# Patient Record
Sex: Female | Born: 1950 | Race: White | Hispanic: No | Smoking: Former smoker
Health system: Southern US, Community
[De-identification: ages and names within clinical notes are randomized; demographics above are authoritative.]

## PROBLEM LIST (undated history)

## (undated) DIAGNOSIS — J45909 Unspecified asthma, uncomplicated: Secondary | ICD-10-CM

## (undated) DIAGNOSIS — G4730 Sleep apnea, unspecified: Secondary | ICD-10-CM

## (undated) DIAGNOSIS — I714 Abdominal aortic aneurysm, without rupture, unspecified: Secondary | ICD-10-CM

## (undated) DIAGNOSIS — I10 Essential (primary) hypertension: Secondary | ICD-10-CM

## (undated) DIAGNOSIS — M1990 Unspecified osteoarthritis, unspecified site: Secondary | ICD-10-CM

## (undated) DIAGNOSIS — J449 Chronic obstructive pulmonary disease, unspecified: Secondary | ICD-10-CM

## (undated) DIAGNOSIS — C189 Malignant neoplasm of colon, unspecified: Secondary | ICD-10-CM

## (undated) DIAGNOSIS — K219 Gastro-esophageal reflux disease without esophagitis: Secondary | ICD-10-CM

## (undated) HISTORY — PX: TUMOR EXCISION: SHX421

## (undated) HISTORY — PX: TONSILLECTOMY: SUR1361

## (undated) HISTORY — PX: KNEE SURGERY: SHX244

## (undated) HISTORY — PX: APPENDECTOMY: SHX54

## (undated) HISTORY — PX: TUBAL LIGATION: SHX77

## (undated) HISTORY — PX: ABDOMINAL HYSTERECTOMY: SHX81

## (undated) NOTE — *Deleted (*Deleted)
[facility]  [address] [place], [place] [address] Phone: [phone]  Fax: [phone]   Clinic Day:  11/11/2019  Referring physician: Hyman Hopes, MD  Chief Complaint: Alicia Holder is a 56 y.o. female with h/o carcinoid tumor, secondary polycythemia, iron deficiency s/p GI bleed and B12 deficiency who is seen for 4 month assessment.  HPI:  The patient was last seen in the hematology clinic on 07/08/2019. At that time, she felt good.  She stopped smoking 7 month prior.  She had stable shortness of breath.  She was on oxygen 2 L/min.  Exam was stable. Hematocrit was 44.5, hemoglobin 14.0, platelets 208,000, WBC 7,800. Ferritin was 27 with an iron saturation of 15% and a TIBC of 421. CA125 was 4.3.  TSH was 1.182 and free T4 was 0.97.  Low dose chest CT on 07/18/2019 revealed Lung-RADS 2S, benign appearance or behavior. There was aortic atherosclerosis, in addition to 2 vessel coronary artery disease. There was mild diffuse bronchial wall thickening with mild centrilobular and paraseptal emphysema; imaging findings suggestive of underlying COPD. There were calcifications of the aortic valve and mitral annulus.  Labs on 09/04/2019 revealed a hematocrit of 42.4, hemoglobin 13.4, platelets 166,000, WBC 7,200. Ferritin was 18.  She received monthly vitamin B12 injections x 4 (07/08/2019 - 10/02/2019).  During the interim, ***   Past Medical History:  Diagnosis Date  . AAA (abdominal aortic aneurysm) (HCC)   . Arthritis    knees, elbows  . Asthma   . Colon cancer (HCC)    colon  . COPD (chronic obstructive pulmonary disease) (HCC)   . GERD (gastroesophageal reflux disease)   . Hypertension   . Sleep apnea     Past Surgical History:  Procedure Laterality Date  . ABDOMINAL HYSTERECTOMY    . APPENDECTOMY    . CATARACT EXTRACTION W/PHACO Left 05/28/2019   Procedure: CATARACT EXTRACTION PHACO AND INTRAOCULAR LENS PLACEMENT (IOC) LEFT  VISION BLUE 31.19 02:18.2;  Surgeon: Galen Manila, MD;  Location: [facility];  Service: Ophthalmology;  Laterality: Left;  . CATARACT EXTRACTION W/PHACO Right 06/18/2019   Procedure: CATARACT EXTRACTION PHACO AND INTRAOCULAR LENS PLACEMENT (IOC) RIGHT 34.59  02:31.7;  Surgeon: Galen Manila, MD;  Location: [facility];  Service: Ophthalmology;  Laterality: Right;  sleep apnea  . ESOPHAGOGASTRODUODENOSCOPY N/A 01/12/2019   Procedure: ESOPHAGOGASTRODUODENOSCOPY (EGD);  Surgeon: Pasty Spillers, MD;  Location: [facility] END[facility];  Service: Endoscopy;  Laterality: N/A;  . ESOPHAGOGASTRODUODENOSCOPY (EGD) WITH PROPOFOL N/A 01/14/2019   Procedure: ESOPHAGOGASTRODUODENOSCOPY (EGD) WITH PROPOFOL;  Surgeon: Kerin Salen, MD;  Location: [facility];  Service: Gastroenterology;  Laterality: N/A;  . HEMOSTASIS CLIP PLACEMENT  01/14/2019   Procedure: HEMOSTASIS CLIP PLACEMENT;  Surgeon: Kerin Salen, MD;  Location: [facility] ENDOSCOPY;  Service: Gastroenterology;;  . KNEE SURGERY Right    fracture repair  . TONSILLECTOMY    . TUBAL LIGATION    . TUMOR EXCISION     colon    Family History  Problem Relation Age of Onset  . CAD Mother   . CAD Father     Social History:  reports that she quit smoking about 10 months ago. She has a 41.00 pack-year smoking history. She has never used smokeless tobacco. She reports current alcohol use. She reports that she does not use drugs. She is a great-grandmother. The patient is alone ***today.   Allergies:  Allergies  Allergen Reactions  . Aspirin     Hx of esophageal bleed  Current Medications: Current Outpatient Medications  Medication Sig Dispense Refill  . acetaminophen (TYLENOL) 500 MG tablet Take 1,000 mg by mouth every 6 (six) hours as needed for mild pain.    Marland Kitchen albuterol (VENTOLIN HFA) 108 (90 Base) MCG/ACT inhaler Inhale 1-2 puffs into the lungs every 4 (four) hours as needed for wheezing or shortness of breath.     Marland Kitchen alendronate  (FOSAMAX) 70 MG tablet Take 70 mg by mouth once a week.    Marland Kitchen buPROPion (WELLBUTRIN XL) 150 MG 24 hr tablet Take 150 mg by mouth daily.     Marland Kitchen docusate sodium (COLACE) 100 MG capsule Take 100 mg by mouth daily.    Marland Kitchen ipratropium-albuterol (DUONEB) 0.5-2.5 (3) MG/3ML SOLN Take 3 mLs by nebulization every 4 (four) hours as needed. 360 mL 0  . lisinopril (ZESTRIL) 20 MG tablet Take 20 mg by mouth daily.     . montelukast (SINGULAIR) 10 MG tablet Take 10 mg by mouth at bedtime.    Marland Kitchen omeprazole (PRILOSEC) 20 MG capsule Take 20 mg by mouth 2 (two) times daily.    Marland Kitchen oxyCODONE (OXY IR/ROXICODONE) 5 MG immediate release tablet Take 5 mg by mouth 3 (three) times daily as needed for moderate pain or breakthrough pain.     . pantoprazole (PROTONIX) 40 MG tablet TAKE 1 TABLET BY MOUTH EVERY DAY 90 tablet 0  . sertraline (ZOLOFT) 100 MG tablet Take 100 mg by mouth daily.    . simvastatin (ZOCOR) 20 MG tablet Take 20 mg by mouth daily.    . TRELEGY ELLIPTA 100-62.5-25 MCG/INH AEPB Inhale 1 puff into the lungs daily.      No current facility-administered medications for this visit.    Review of Systems  Constitutional: Negative for chills, diaphoresis, fever, malaise/fatigue and weight loss (up 2 pounds; goal weight 145 lbs).       Feels "better".   HENT: Negative.  Negative for congestion, ear pain, hearing loss, nosebleeds, sinus pain and sore throat.   Eyes: Negative.  Negative for blurred vision and double vision.  Respiratory: Positive for shortness of breath (on oxygen, 2L). Negative for cough and hemoptysis.        On 3 liters/min of oxygen.  Cardiovascular: Negative.  Negative for chest pain, palpitations, orthopnea and PND.  Gastrointestinal: Negative.  Negative for abdominal pain, blood in stool, constipation, diarrhea, melena, nausea and vomiting.       Eating well. Soft diet. No spicy or greasy fried food.  Genitourinary: Negative.  Negative for dysuria, frequency, hematuria and urgency.   Musculoskeletal: Positive for joint pain (chronic right knee pain, unchanged). Negative for back pain, myalgias and neck pain.  Skin: Negative.  Negative for rash.  Neurological: Negative.  Negative for dizziness, tingling, sensory change, focal weakness, weakness and headaches.  Endo/Heme/Allergies: Negative.  Does not bruise/bleed easily.  Psychiatric/Behavioral: Negative.  Negative for depression and memory loss. The patient is not nervous/anxious and does not have insomnia.   All other systems reviewed and are negative.  Performance status (ECOG): 1 - Symptomatic but completely ambulatory ***  Vitals There were no vitals taken for this visit.   Physical Exam Vitals and nursing note reviewed.  Constitutional:      General: She is not in acute distress.    Appearance: She is well-developed. She is not diaphoretic.     Comments: She has a portable oxygen tank by her side.  HENT:     Head: Normocephalic and atraumatic.     Mouth/Throat:  Pharynx: No oropharyngeal exudate.  Eyes:     General: No scleral icterus.    Conjunctiva/sclera: Conjunctivae normal.     Pupils: Pupils are equal, round, and reactive to light.     Comments: Blue eyes.  Neck:     Vascular: No JVD.  Cardiovascular:     Rate and Rhythm: Normal rate and regular rhythm.     Heart sounds: Normal heart sounds. No murmur heard.   Pulmonary:     Effort: Pulmonary effort is normal. No respiratory distress.     Breath sounds: Normal breath sounds. No wheezing or rales.  Abdominal:     General: Bowel sounds are normal. There is no distension.     Palpations: Abdomen is soft. There is no mass.     Tenderness: There is no abdominal tenderness. There is no guarding or rebound.  Musculoskeletal:        General: Normal range of motion.     Cervical back: Normal range of motion and neck supple.  Lymphadenopathy:     Head:     Right side of head: No preauricular, posterior auricular or occipital adenopathy.     Left  side of head: No preauricular, posterior auricular or occipital adenopathy.     Cervical: No cervical adenopathy.     Upper Body:     Right upper body: No supraclavicular adenopathy.     Left upper body: No supraclavicular adenopathy.  Skin:    General: Skin is warm and dry.     Coloration: Skin is not pale.     Comments: Nicotine stained nails.  Neurological:     Mental Status: She is alert and oriented to person, place, and time.  Psychiatric:        Behavior: Behavior normal.        Thought Content: Thought content normal.        Judgment: Judgment normal.     No visits with results within 3 Day(s) from this visit.  Latest known visit with results is:  Appointment on 09/04/2019  Component Date Value Ref Range Status  . Ferritin 09/04/2019 18  11 - 307 ng/mL Final   Performed at [facility], [address] [place] [place], [place] [address]  . WBC 09/04/2019 7.2  4.0 - 10.5 K/uL Final  . RBC 09/04/2019 4.50  3.87 - 5.11 MIL/uL Final  . Hemoglobin 09/04/2019 13.4  12.0 - 15.0 g/dL Final  . HCT 19/14/7829 42.4  36 - 46 % Final  . MCV 09/04/2019 94.2  80.0 - 100.0 fL Final  . MCH 09/04/2019 29.8  26.0 - 34.0 pg Final  . MCHC 09/04/2019 31.6  30.0 - 36.0 g/dL Final  . RDW 56/21/3086 14.7  11.5 - 15.5 % Final  . Platelets 09/04/2019 166  150 - 400 K/uL Final  . nRBC 09/04/2019 0.0  0.0 - 0.2 % Final  . Neutrophils Relative % 09/04/2019 63  % Final  . Neutro Abs 09/04/2019 4.6  1.7 - 7.7 K/uL Final  . Lymphocytes Relative 09/04/2019 24  % Final  . Lymphs Abs 09/04/2019 1.7  0.7 - 4.0 K/uL Final  . Monocytes Relative 09/04/2019 8  % Final  . Monocytes Absolute 09/04/2019 0.6  0.1 - 1.0 K/uL Final  . Eosinophils Relative 09/04/2019 4  % Final  . Eosinophils Absolute 09/04/2019 0.3  0 - 0 K/uL Final  . Basophils Relative 09/04/2019 1  % Final  . Basophils Absolute 09/04/2019 0.1  0 - 0 K/uL Final  .  Immature Granulocytes 09/04/2019 0  % Final  . Abs Immature Granulocytes  09/04/2019 0.03  0.00 - 0.07 K/uL Final   Performed at [facility], [address], [place], [place] [address]    Assessment:  KIMIYA BRUNELLE is a 10 y.o. 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 She has a 60+ pack year smoking history. She denies sleep apnea.   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 Last colonoscopywas 10/2012 [phone] per patient). She has lost 120 pounds in 3 years with portion control and food choices.  Work-upon 01/04/2017 revealed a hematocrit of 50.8, hemoglobin 17, MCV 94.7, platelets 173,000, white count 7400 with an ANC of 5100. Differential was normal. Normal labs included CMP, epo level (10.7), ferritin (27), with an iron saturation (6%), JAK2 V617F and exons 12-15 were negative. BCR-ABL testing was negative for e1a2 (p190), e13a2 (b2a2, p210) and e14a2 (b3a2, p210) fusion transcripts. Carbon monoxide levelwas 17% (<9% smokers).   Low dose chest CT on 03/16/2017 was lung-RADS 2, benign appearance or behavior. There was left main and two-vessel coronary atherosclerosis. There was a 4.2 cm ascending thoracic aortic aneurysmwith recommendation for annual imaging followup by CTA or MRA. 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 There was a multinodular goiter. Suspected dominant 4.5 cm inferior left thyroid lobe nodule. There was a small to moderate hiatal hernia. Low dose chest CTon 03/20/2018 revealed benign appearance or behavior. Lung-RADS score was 2.  Thyroid ultrasound on 04/03/2017 revealed 7 nodules. Left mid thyroid nodule (labeled 4) met criteria for biopsy. The left inferior thyroid nodule (labeled 7) would meet criteria for biopsy, however, was favored to be a pseudo nodule and extension of thyroid goiter into the mediastinum given the appearance on prior CT. Left thyroid nodules (labeled 5 and 6) both met criteria for surveillance. None of the right-sided nodules met criteria for  surveillance or biopsy.Thyroid ultrasoundon 09/10/2018 revealed thyromegaly with bilateral nodules, stable. None metcriteria for biopsy. Recommended annual/biennial ultrasound follow-up   FNA thyroid biopsy x 2 on 04/20/2017 of the left mid thyroid nodule and left inferior thyroid nodule were benign (Bethesda category II) and c/w benign follicular nodule.   She began a phlebotomy programon 02/08/2017 (last 10/12/2018). She undergoes small volume phlebotomy (300 cc)if her hematocrit is >47.  She was admitted to [facility] from 01/11/2019 - 01/13/2019 then transferred to [facility] from 01/13/2019 - 01/18/2019 for GI bleeding.  Hemoglobin was 5.9.  EGD on 01/12/2019 revealed red blood in the distal esophagus and large blood clots in the gastric fundus. Hemospray was used to treat the distal esophagus and gastric fundus.  She received 6 units of PRBCs.  She received a Protonix drip. She was transferred to [facility] for vascular intervention.  EGD on 01/14/2019 revealed severe (LA grade D) esophagitis with bleeding.  There were esophageal ulcers with active bleeding.  Hemostatic clip was placed.There was erythematous mucosa in the cardia, gastric fundus, gastric body, incisura, antrum, and prepyloric region of the stomach and pylorus.  She received 1 unit of PRBCs on 01/17/2019.  She has iron deficiency anemia s/p massive GI bleeding.  Labs on 01/11/2019 revealed a ferritin 26 with an iron saturation 12% with a TIBC 247.  B12 was 173 (low) and folate 10.3 (normal).  Hemolysis evaluation (Coombs, haptoglobin, LDH) was normal.  Retic was 4.8%.  She received weekly Venofer x 3 (02/18/2019 - 03/04/2019).   She has B12 deficiency.  B12 was 173 on 01/11/2019.  She receives monthly B12 injections (  began 01/14/2019; last 10/02/2019).  She has a history of carcinoid tumor.  Chromogranin was 926.7 (0-101.8) on 02/13/2019 and 330.8 on 05/09/2019.   24 hour urine for 5HIAA was 2.4 mg/24 hours (0-14.9) on  03/17/2019.  She received her first COVID-19 vaccine at the end of 06/2019; she is due for her second shot next week.   Symptomatically, ***  Plan: 1.   Review labs from 11/11/2019.   2.   Secondary erythrocytosis  She stopped smoking 7 months ago.  Hematocrit 37.2  Hemoglobin 11.0.  MCV 90.1 on 03/14/2019.  Hematocrit 44.5  Hemoglobin 14.0.  MCV 90.6 on 07/05/2019.  Continue to monitor. 3.  History of GI bleed             EGD on 01/14/2019 revealed severe esophagitis with ulcers and active bleeding.             She remains on Protonix.  Anticipate repeat upper endoscopy.  Continue to monitor. 4.   Iron deficiency             Hematocrit 44.5  Hemoglobin 14.0.  MCV 90.6 on 07/05/2019.             Ferritin 27 on 07/05/2019.             She completed Venofer x 3 on 03/04/2019.  No Venofer needed.  Continue to monitor. 5.   B12 deficiency             B12 was 173 on 01/11/2019.             She received B12 on 06/06/2019.             B12 and monthly x 6.  Monitor folate annually. 6.  History of carcinoid tumor             Chromogranin was 926.7 (high) on 02/13/2019.  Unclear if elevation due to recurrent carcinoid, chronic atrophic gastritis or PPI.  24 hour urine for 5HIAA was 2.4 mg/24 hours (0-14.9).  No current plan for octreotide scan vs DOTATE PET scan.  Continue to monitor. 7.   Thyroid nodules and low TSH TSH was 1.182 and free T4 was 0.97 on 07/05/2019.  Thyroid ultrasoundon 09/10/2018 revealed stable bilateral thyroid nodules. Thyroid ultrasound planned in 08/2019. 8.     Left ovarian cyst  Patient denies any abdominal symptoms.  CA125 was 4.3 on 07/05/2019.  Transabdominal and transvaginal ultrasound of pelvis on 04/22/2019 revealed a peripheral 1.7 x 1.0 x 1.0 cm cystic lesion in the left ovary with a single internal septation.   Discuss plan for follow-up ultrasound on 08/16/2019. 9.   Pulmonary nodules  Chest CT on 04/19/2019 was  personally reviewed.  Agree with radiology interpretation.    There is a new 8 mm left upper lobe nodule and is well as a 7 mm right upper lobe nodule   Discuss importance of short-term follow-up given smoking history   10.Weight loss Weight is up 2 pounds. Goal weight is 145 pounds. Continue to monitor. 11.   RTC in 2 months for labs (CBC with diff, ferritin) and B12. 12.   RTC in 4 months for MD assessment, labs (CBC with diff, ferritin, iron studies, chromogranin- day before), B12 and +/- Venofer.  I discussed the assessment and treatment plan with the patient.  The patient was provided an opportunity to ask questions and all were answered.  The patient agreed with the plan and demonstrated an understanding of the instructions.  The patient  was advised to call back if the symptoms worsen or if the condition fails to improve as anticipated.  I provided *** minutes of face-to-face time during this this encounter and > 50% was spent counseling as documented under my assessment and plan.  Rosey Bath, MD, PhD    11/11/2019, 11:45 AM  I, Danella Penton Tufford, am acting as a Neurosurgeon for [employer]. Merlene Pulling, MD.   I, Melissa C. Merlene Pulling, MD, have reviewed the above documentation for accuracy and completeness, and I agree with the above.

---

## 2002-12-06 ENCOUNTER — Other Ambulatory Visit: Payer: Self-pay

## 2003-10-10 ENCOUNTER — Other Ambulatory Visit: Payer: Self-pay

## 2005-04-28 ENCOUNTER — Ambulatory Visit: Payer: Self-pay

## 2006-04-21 ENCOUNTER — Inpatient Hospital Stay: Payer: Self-pay | Admitting: Internal Medicine

## 2006-04-21 ENCOUNTER — Ambulatory Visit: Payer: Self-pay | Admitting: Cardiology

## 2006-04-21 ENCOUNTER — Other Ambulatory Visit: Payer: Self-pay

## 2006-05-21 ENCOUNTER — Ambulatory Visit: Payer: Self-pay | Admitting: Specialist

## 2006-09-25 ENCOUNTER — Ambulatory Visit: Payer: Self-pay

## 2007-05-22 ENCOUNTER — Ambulatory Visit: Payer: Self-pay | Admitting: Gastroenterology

## 2007-07-09 DIAGNOSIS — Z85060 Personal history of malignant carcinoid tumor of small intestine: Secondary | ICD-10-CM | POA: Insufficient documentation

## 2007-08-31 ENCOUNTER — Ambulatory Visit: Payer: Self-pay | Admitting: Surgery

## 2007-08-31 ENCOUNTER — Other Ambulatory Visit: Payer: Self-pay

## 2007-09-12 ENCOUNTER — Inpatient Hospital Stay: Payer: Self-pay | Admitting: Surgery

## 2009-12-28 ENCOUNTER — Ambulatory Visit: Payer: Self-pay | Admitting: Internal Medicine

## 2010-04-08 ENCOUNTER — Ambulatory Visit: Payer: Self-pay | Admitting: Internal Medicine

## 2011-08-17 ENCOUNTER — Ambulatory Visit: Payer: Self-pay | Admitting: Internal Medicine

## 2012-11-08 ENCOUNTER — Ambulatory Visit: Payer: Self-pay | Admitting: Gastroenterology

## 2012-11-21 ENCOUNTER — Ambulatory Visit: Payer: Self-pay | Admitting: Internal Medicine

## 2013-04-30 ENCOUNTER — Emergency Department: Payer: Self-pay | Admitting: Emergency Medicine

## 2014-07-10 ENCOUNTER — Other Ambulatory Visit: Payer: Self-pay | Admitting: Internal Medicine

## 2014-07-10 DIAGNOSIS — Z780 Asymptomatic menopausal state: Secondary | ICD-10-CM

## 2014-07-10 DIAGNOSIS — Z1231 Encounter for screening mammogram for malignant neoplasm of breast: Secondary | ICD-10-CM

## 2014-07-16 ENCOUNTER — Ambulatory Visit
Admission: RE | Admit: 2014-07-16 | Discharge: 2014-07-16 | Disposition: A | Discharge status: Home / Self Care | Payer: Medicare HMO | Source: Ambulatory Visit | Attending: Internal Medicine | Admitting: Internal Medicine

## 2014-07-16 DIAGNOSIS — Z1231 Encounter for screening mammogram for malignant neoplasm of breast: Secondary | ICD-10-CM | POA: Diagnosis present

## 2014-07-16 DIAGNOSIS — M8580 Other specified disorders of bone density and structure, unspecified site: Secondary | ICD-10-CM | POA: Insufficient documentation

## 2014-07-16 DIAGNOSIS — Z780 Asymptomatic menopausal state: Secondary | ICD-10-CM

## 2014-07-16 HISTORY — DX: Malignant neoplasm of colon, unspecified: C18.9

## 2016-05-23 ENCOUNTER — Telehealth: Payer: Self-pay | Admitting: *Deleted

## 2016-05-23 NOTE — Telephone Encounter (Signed)
Received referral for low dose lung cancer screening CT scan. Voicemail left at phone number listed in EMR for patient to call me back to facilitate scheduling scan.  

## 2016-05-26 ENCOUNTER — Telehealth: Payer: Self-pay | Admitting: *Deleted

## 2016-05-26 NOTE — Telephone Encounter (Signed)
Received referral for low dose lung cancer screening CT scan. Voicemail left at phone number listed in EMR for patient to call me back to facilitate scheduling scan.  

## 2016-06-13 ENCOUNTER — Ambulatory Visit: Payer: Medicare HMO | Admitting: Oncology

## 2016-06-15 ENCOUNTER — Inpatient Hospital Stay: Payer: Medicare HMO | Admitting: Hematology and Oncology

## 2016-06-15 NOTE — Progress Notes (Deleted)
[facility] [place] [facility] day:  06/15/2016  Chief Complaint: Alicia Holder is a 66 y.o. female  With polycythemia who is referred in consultation by Dr. Kingsley Spittle for assessment and management.  HPI: ***   Past Medical History:  Diagnosis Date  . Colon cancer     No past surgical history on file.  No family history on file.  Social History:  has no tobacco, alcohol, and drug history on file.  The patient is accompanied by *** alone today.  Allergies: Allergies not on file  Current Medications: No current outpatient prescriptions on file.   No current facility-administered medications for this visit.     Review of Systems:  GENERAL:  Feels good.  Active.  No fevers, sweats or weight loss. PERFORMANCE STATUS (ECOG):  *** HEENT:  No visual changes, runny nose, sore throat, mouth sores or tenderness. Lungs: No shortness of breath or cough.  No hemoptysis. Cardiac:  No chest pain, palpitations, orthopnea, or PND. GI:  No nausea, vomiting, diarrhea, constipation, melena or hematochezia. GU:  No urgency, frequency, dysuria, or hematuria. Musculoskeletal:  No back pain.  No joint pain.  No muscle tenderness. Extremities:  No pain or swelling. Skin:  No rashes or skin changes. Neuro:  No headache, numbness or weakness, balance or coordination issues. Endocrine:  No diabetes, thyroid issues, hot flashes or night sweats. Psych:  No mood changes, depression or anxiety. Pain:  No focal pain. Review of systems:  All other systems reviewed and found to be negative.   Physical Exam: There were no vitals taken for this visit. GENERAL:  Well developed, well nourished, sitting comfortably in the exam room in no acute distress. MENTAL STATUS:  Alert and oriented to person, place and time. HEAD:  *** hair.  Normocephalic, atraumatic, face symmetric, no Cushingoid features. EYES:  *** eyes.  Pupils equal round and reactive to light and accomodation.   No conjunctivitis or scleral icterus. ENT:  Oropharynx clear without lesion.  Tongue normal. Mucous membranes moist.  RESPIRATORY:  Clear to auscultation without rales, wheezes or rhonchi. CARDIOVASCULAR:  Regular rate and rhythm without murmur, rub or gallop. ABDOMEN:  Soft, non-tender, with active bowel sounds, and no hepatosplenomegaly.  No masses. SKIN:  No rashes, ulcers or lesions. EXTREMITIES: No edema, no skin discoloration or tenderness.  No palpable cords. LYMPH NODES: No palpable cervical, supraclavicular, axillary or inguinal adenopathy  NEUROLOGICAL: Unremarkable. PSYCH:  Appropriate.  No visits with results within 3 Day(s) from this visit.  Latest known visit with results is:  No results found for any previous visit.    Assessment:  Alicia Holder is a 66 y.o. female ***  Plan: 1. *** 2. *** 3. *** 4. *** 5. ***  Lequita Asal, MD  06/15/2016, 4:45 AM

## 2016-06-22 ENCOUNTER — Ambulatory Visit: Payer: Medicare HMO | Admitting: Hematology and Oncology

## 2016-06-22 NOTE — Progress Notes (Deleted)
[place] [facility] day:  06/22/2016  Chief Complaint: Alicia Holder is a 66 y.o. female with polycythemia who is referred in consultation by Dr Quay Burow for assessment and management.  HPI: ***   Past Medical History:  Diagnosis Date  . Colon cancer     No past surgical history on file.  No family history on file.  Social History:  has no tobacco, alcohol, and drug history on file.  The patient is accompanied by *** alone today.  Allergies: Allergies not on file  Current Medications: No current outpatient prescriptions on file.   No current facility-administered medications for this visit.     Review of Systems:  GENERAL:  Feels good.  Active.  No fevers, sweats or weight loss. PERFORMANCE STATUS (ECOG):  *** HEENT:  No visual changes, runny nose, sore throat, mouth sores or tenderness. Lungs: No shortness of breath or cough.  No hemoptysis. Cardiac:  No chest pain, palpitations, orthopnea, or PND. GI:  No nausea, vomiting, diarrhea, constipation, melena or hematochezia. GU:  No urgency, frequency, dysuria, or hematuria. Musculoskeletal:  No back pain.  No joint pain.  No muscle tenderness. Extremities:  No pain or swelling. Skin:  No rashes or skin changes. Neuro:  No headache, numbness or weakness, balance or coordination issues. Endocrine:  No diabetes, thyroid issues, hot flashes or night sweats. Psych:  No mood changes, depression or anxiety. Pain:  No focal pain. Review of systems:  All other systems reviewed and found to be negative.   Physical Exam: There were no vitals taken for this visit. GENERAL:  Well developed, well nourished, sitting comfortably in the exam room in no acute distress. MENTAL STATUS:  Alert and oriented to person, place and time. HEAD:  *** hair.  Normocephalic, atraumatic, face symmetric, no Cushingoid features. EYES:  *** eyes.  Pupils equal round and reactive to light and accomodation.  No  conjunctivitis or scleral icterus. ENT:  Oropharynx clear without lesion.  Tongue normal. Mucous membranes moist.  RESPIRATORY:  Clear to auscultation without rales, wheezes or rhonchi. CARDIOVASCULAR:  Regular rate and rhythm without murmur, rub or gallop. ABDOMEN:  Soft, non-tender, with active bowel sounds, and no hepatosplenomegaly.  No masses. SKIN:  No rashes, ulcers or lesions. EXTREMITIES: No edema, no skin discoloration or tenderness.  No palpable cords. LYMPH NODES: No palpable cervical, supraclavicular, axillary or inguinal adenopathy  NEUROLOGICAL: Unremarkable. PSYCH:  Appropriate.   No visits with results within 3 Day(s) from this visit.  Latest known visit with results is:  No results found for any previous visit.    Assessment:  Alicia Holder is a 66 y.o. female ***  Plan: 1. *** 2. *** 3. *** 4. *** 5. ***  Lequita Asal, MD  06/22/2016, 6:04 AM

## 2016-12-16 ENCOUNTER — Other Ambulatory Visit: Payer: Self-pay | Admitting: Internal Medicine

## 2016-12-16 DIAGNOSIS — Z1231 Encounter for screening mammogram for malignant neoplasm of breast: Secondary | ICD-10-CM

## 2017-01-04 ENCOUNTER — Encounter: Payer: Self-pay | Admitting: Hematology and Oncology

## 2017-01-04 ENCOUNTER — Other Ambulatory Visit: Payer: Self-pay

## 2017-01-04 ENCOUNTER — Inpatient Hospital Stay: Payer: Medicare HMO | Attending: Hematology and Oncology | Admitting: Hematology and Oncology

## 2017-01-04 ENCOUNTER — Inpatient Hospital Stay: Payer: Medicare HMO

## 2017-01-04 VITALS — BP 161/91 | HR 85 | Temp 96.5°F | Resp 20 | Ht 63.5 in | Wt 182.8 lb

## 2017-01-04 DIAGNOSIS — Z806 Family history of leukemia: Secondary | ICD-10-CM

## 2017-01-04 DIAGNOSIS — Z85038 Personal history of other malignant neoplasm of large intestine: Secondary | ICD-10-CM | POA: Diagnosis not present

## 2017-01-04 DIAGNOSIS — D751 Secondary polycythemia: Secondary | ICD-10-CM | POA: Diagnosis not present

## 2017-01-04 DIAGNOSIS — F17210 Nicotine dependence, cigarettes, uncomplicated: Secondary | ICD-10-CM

## 2017-01-04 DIAGNOSIS — Z79899 Other long term (current) drug therapy: Secondary | ICD-10-CM | POA: Diagnosis not present

## 2017-01-04 DIAGNOSIS — M25562 Pain in left knee: Secondary | ICD-10-CM | POA: Diagnosis not present

## 2017-01-04 DIAGNOSIS — R0602 Shortness of breath: Secondary | ICD-10-CM | POA: Diagnosis not present

## 2017-01-04 DIAGNOSIS — J449 Chronic obstructive pulmonary disease, unspecified: Secondary | ICD-10-CM | POA: Diagnosis not present

## 2017-01-04 DIAGNOSIS — G8929 Other chronic pain: Secondary | ICD-10-CM | POA: Diagnosis not present

## 2017-01-04 DIAGNOSIS — R634 Abnormal weight loss: Secondary | ICD-10-CM

## 2017-01-04 LAB — CBC WITH DIFFERENTIAL/PLATELET
BASOS ABS: 0.1 10*3/uL (ref 0–0.1)
Basophils Relative: 1 %
Eosinophils Absolute: 0.1 10*3/uL (ref 0–0.7)
Eosinophils Relative: 2 %
HEMATOCRIT: 50.8 % — AB (ref 35.0–47.0)
HEMOGLOBIN: 17 g/dL — AB (ref 12.0–16.0)
LYMPHS PCT: 21 %
Lymphs Abs: 1.5 10*3/uL (ref 1.0–3.6)
MCH: 31.7 pg (ref 26.0–34.0)
MCHC: 33.4 g/dL (ref 32.0–36.0)
MCV: 94.7 fL (ref 80.0–100.0)
MONO ABS: 0.6 10*3/uL (ref 0.2–0.9)
MONOS PCT: 8 %
NEUTROS ABS: 5.1 10*3/uL (ref 1.4–6.5)
NEUTROS PCT: 68 %
Platelets: 173 10*3/uL (ref 150–440)
RBC: 5.37 MIL/uL — ABNORMAL HIGH (ref 3.80–5.20)
RDW: 14.8 % — AB (ref 11.5–14.5)
WBC: 7.4 10*3/uL (ref 3.6–11.0)

## 2017-01-04 LAB — COMPREHENSIVE METABOLIC PANEL
ALBUMIN: 3.9 g/dL (ref 3.5–5.0)
ALT: 14 U/L (ref 14–54)
ANION GAP: 9 (ref 5–15)
AST: 17 U/L (ref 15–41)
Alkaline Phosphatase: 38 U/L (ref 38–126)
BUN: 9 mg/dL (ref 6–20)
CO2: 27 mmol/L (ref 22–32)
Calcium: 8.7 mg/dL — ABNORMAL LOW (ref 8.9–10.3)
Chloride: 100 mmol/L — ABNORMAL LOW (ref 101–111)
Creatinine, Ser: 0.82 mg/dL (ref 0.44–1.00)
GFR calc non Af Amer: >60 mL/min (ref >60)
GLUCOSE: 98 mg/dL (ref 65–99)
POTASSIUM: 4.7 mmol/L (ref 3.5–5.1)
SODIUM: 136 mmol/L (ref 135–145)
TOTAL PROTEIN: 7.1 g/dL (ref 6.5–8.1)
Total Bilirubin: 0.6 mg/dL (ref 0.3–1.2)

## 2017-01-04 LAB — IRON AND TIBC
Iron: 23 ug/dL — ABNORMAL LOW (ref 28–170)
SATURATION RATIOS: 6 % — AB (ref 10.4–31.8)
TIBC: 392 ug/dL (ref 250–450)
UIBC: 369 ug/dL

## 2017-01-04 LAB — FERRITIN: Ferritin: 27 ng/mL (ref 11–307)

## 2017-01-04 NOTE — Progress Notes (Signed)
Patient here today as new evaluation regarding polycythemia.  Referred by [place].

## 2017-01-04 NOTE — Progress Notes (Signed)
[facility] day:  01/04/2017  Chief Complaint: Alicia Holder is a 66 y.o. 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 Kingsley Spittle for assessment and management.  HPI:  Patient was advised about polycythemia in 04/2015 when routine labs were performed.  Patient states, "My lab work was always good until this last time".  No labs included on referral paperwork.   The patient has a 60+ pack year smoking history.  She has COPD.  Patient was started on supplemental oxygen, which she used for "about a year".  Notes indicate that she stopped using oxygen in the summer of 2015.  She was followed by Dr. Raul Del in the past.   She was referred to the low dose chest CT program in the past, however she did not follow through with testing due to transportation issues.  Patient notes that Burgess Estelle, RN is her cousin.   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  Last colonoscopy was 10/2012.  She has been referred to Dr. Allen Norris for monitoring.  She has a family history of leukemia.   Symptomatically, she feels "great". She denies fevers and sweats. Patient notes that she has intentionally lost 120 pounds over the last 3 years (original weight "over 300 pounds").   She states that she restructured her eating and uses portion control.  She eats 1150 calories/day.  Weight has been stable over the last 3 months. Patient maintains a 1500 calorie diet. She eats lots of vegetables. Patient eats some type of meat daily. Patient denies any ice pica.  She has orthopnea with associated cough when supine. She denies chest pain and shortness of breath. Patient experiences opioid induced constipation. She takes opioids for chronic LEFT knee pain; she has hardware in place.   Mammogram scheduled for 01/19/2017.  Last colonoscopy was in 2014. She is scheduled for a repeat colonoscopy in 2019. Patient has been tested for OSAH  syndrome; results from PSG were negative.    Past Medical History:  Diagnosis Date  . Colon cancer [facility])     Past Surgical History:  Procedure Laterality Date  . ABDOMINAL HYSTERECTOMY    . APPENDECTOMY      No family history on file.  Social History:  reports that she has been smoking.  She has a 41.00 pack-year smoking history. She does not have any smokeless tobacco history on file. She reports that she drinks alcohol. Her drug history is not on file.  She states that she smokes 3 ppd x 20 years.  She now smokes a pack of cigarettes a day.  She drinks 1-2 alcoholic drinks/month.  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  Patient denies known exposures to radiation or toxins. She lives with her mentally challenged daughter, Alicia Holder.  Her other daughter, Alicia Holder, helps out.  Patient does not drive.  She lives in a trailer in [place] that is near her daughter, Alicia Holder.  She has several grandchildren.  She has a GED and geriatric nursing assistant degree.  The patient is alone today.  Allergies: No Known Allergies  Current Medications: Current Outpatient Medications  Medication Sig Dispense Refill  . ADVAIR DISKUS 250-50 MCG/DOSE AEPB Inhale 1 puff into the lungs 2 (two) times daily.    Marland Kitchen docusate sodium (COLACE) 100 MG capsule Take 100 mg by mouth daily.    Marland Kitchen lisinopril (PRINIVIL,ZESTRIL) 10 MG  tablet Take 10 mg by mouth daily.    . montelukast (SINGULAIR) 10 MG tablet Take 10 mg by mouth at bedtime.    Marland Kitchen oxyCODONE (OXY IR/ROXICODONE) 5 MG immediate release tablet Take 5 mg by mouth 3 (three) times daily.    . sertraline (ZOLOFT) 50 MG tablet Take 50 mg by mouth daily.    . simvastatin (ZOCOR) 20 MG tablet Take 20 mg by mouth daily.     No current facility-administered medications for this visit.     Review of Systems:  GENERAL:  Feels "great".  No fevers, sweats or unintentional weight loss (see HPI). PERFORMANCE STATUS (ECOG):  0 HEENT:  No visual  changes, runny nose, sore throat, mouth sores or tenderness. Lungs: COPD. Orthopnea with associated cough. No shortness of breath at rest while upright.  No hemoptysis.  No sleep apnea. Cardiac:  No chest pain, palpitations, orthopnea, or PND. GI:  Opiod induced constipation.  No nausea, vomiting, diarrhea, melena or hematochezia. GU:  No urgency, frequency, dysuria, or hematuria. Musculoskeletal:  No back pain.  Fingers sore.  No muscle tenderness. Extremities:  No pain or swelling. Skin:  No rashes or skin changes. Neuro:  No headache, numbness or weakness, balance or coordination issues. Endocrine:  No diabetes, thyroid issues, hot flashes or night sweats. Psych:  No mood changes.  Depression on Zoloft.  No anxiety. Pain:  No focal pain. Review of systems:  All other systems reviewed and found to be negative.  Physical Exam: Blood pressure (!) 161/91, pulse 85, temperature (!) 96.5 F (35.8 C), temperature source Tympanic, resp. rate 20, height 5' 3.5" (1.613 m), weight 182 lb 12.2 oz (82.9 kg), SpO2 93 %. GENERAL:  Well developed, well nourished, woman sitting comfortably in the exam room in no acute distress. MENTAL STATUS:  Alert and oriented to person, place and time. HEAD:  Short red hair.  Normocephalic, atraumatic, face symmetric, no Cushingoid features. EYES:  Pink rimmed glasses.  Blue eyes.  Pupils equal round and reactive to light and accomodation.  No conjunctivitis or scleral icterus. ENT:  Oropharynx clear without lesion.  Tongue normal.  No upper teeth.  Four lower teeth.  Mucous membranes moist.  RESPIRATORY:  Decreased respiratory excursion with decreased breath sounds at the bases.  Clear to auscultation without rales, wheezes or rhonchi. CARDIOVASCULAR:  Regular rate and rhythm without murmur, rub or gallop. ABDOMEN:  Umbilical hernia. Soft, non-tender, with active bowel sounds, and no hepatosplenomegaly.  No masses. SKIN:  Fingertips and nails are black from unfiltered  cigarette use. No rashes, ulcers or lesions. EXTREMITIES: No edema, no skin discoloration or tenderness.  No palpable cords. LYMPH NODES: No palpable cervical, supraclavicular, axillary or inguinal adenopathy  NEUROLOGICAL: Unremarkable. PSYCH:  Appropriate.   Appointment on 01/04/2017  Component Date Value Ref Range Status  . Iron 01/04/2017 23* 28 - 170 ug/dL Final  . TIBC 01/04/2017 392  250 - 450 ug/dL Final  . Saturation Ratios 01/04/2017 6* 10.4 - 31.8 % Final  . UIBC 01/04/2017 369  ug/dL Final  . Ferritin 01/04/2017 27  11 - 307 ng/mL Final  . Carbon Monoxide, Blood 01/04/2017 17.0* 0.0 - 3.6 % Final   Comment: (NOTE)                            Environmental Exposure:  Nonsmokers           <3.7                             Smokers              <9.9                            Occupational Exposure:                             BEI                   3.5                                Detection Limit =  0.2 **Verified by repeat analysis** Performed At: [facility] [place], [place] [id number] Rush Farmer MD GY:[id number]   . Erythropoietin 01/04/2017 10.7  2.6 - 18.5 mIU/mL Final   Comment: (NOTE) [employer] UniCel DxI 800 Immunoassay System Performed At: [facility] [place], [place] [id number] Rush Farmer MD EX:[id number]   . Sodium 01/04/2017 136  135 - 145 mmol/L Final  . Potassium 01/04/2017 4.7  3.5 - 5.1 mmol/L Final  . Chloride 01/04/2017 100* 101 - 111 mmol/L Final  . CO2 01/04/2017 27  22 - 32 mmol/L Final  . Glucose, Bld 01/04/2017 98  65 - 99 mg/dL Final  . BUN 01/04/2017 9  6 - 20 mg/dL Final  . Creatinine, Ser 01/04/2017 0.82  0.44 - 1.00 mg/dL Final  . Calcium 01/04/2017 8.7* 8.9 - 10.3 mg/dL Final  . Total Protein 01/04/2017 7.1  6.5 - 8.1 g/dL Final  . Albumin 01/04/2017 3.9  3.5 - 5.0 g/dL Final  . AST 01/04/2017 17  15 - 41 U/L Final  . ALT 01/04/2017 14  14 - 54 U/L  Final  . Alkaline Phosphatase 01/04/2017 38  38 - 126 U/L Final  . Total Bilirubin 01/04/2017 0.6  0.3 - 1.2 mg/dL Final  . GFR calc non Af Amer 01/04/2017 >60  >60 mL/min Final  . GFR calc Af Amer 01/04/2017 >60  >60 mL/min Final   Comment: (NOTE) The eGFR has been calculated using the CKD EPI equation. This calculation has not been validated in all clinical situations. eGFR's persistently <60 mL/min signify possible Chronic Kidney Disease.   . Anion gap 01/04/2017 9  5 - 15 Final  . WBC 01/04/2017 7.4  3.6 - 11.0 K/uL Final  . RBC 01/04/2017 5.37* 3.80 - 5.20 MIL/uL Final  . Hemoglobin 01/04/2017 17.0* 12.0 - 16.0 g/dL Final  . HCT 01/04/2017 50.8* 35.0 - 47.0 % Final  . MCV 01/04/2017 94.7  80.0 - 100.0 fL Final  . MCH 01/04/2017 31.7  26.0 - 34.0 pg Final  . MCHC 01/04/2017 33.4  32.0 - 36.0 g/dL Final  . RDW 01/04/2017 14.8* 11.5 - 14.5 % Final  . Platelets 01/04/2017 173  150 - 440 K/uL Final  . Neutrophils Relative % 01/04/2017 68  % Final  . Neutro Abs 01/04/2017 5.1  1.4 - 6.5 K/uL Final  . Lymphocytes Relative 01/04/2017 21  % Final  . Lymphs Abs 01/04/2017 1.5  1.0 - 3.6 K/uL Final  . Monocytes Relative 01/04/2017 8  %  Final  . Monocytes Absolute 01/04/2017 0.6  0.2 - 0.9 K/uL Final  . Eosinophils Relative 01/04/2017 2  % Final  . Eosinophils Absolute 01/04/2017 0.1  0 - 0.7 K/uL Final  . Basophils Relative 01/04/2017 1  % Final  . 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  She has a 60+ pack year smoking history.  She denies sleep apnea.   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  Last colonoscopy was 10/2012 (next due 2019 per patient).  She has lost 120 pounds in 3 years with portion control and food choices.  Symptomatically, she feels good.  Exam reveals tobacco stained fingernails.  Plan: 1.  Discuss erythrocytosis.  Discuss secondary and primary  causes.  Encourage smoking cessation.  Discuss laboratory work-up. 2.  Discuss low dose chest CT program. 3.  Labs today: CBC with diff, CMP, JAK2 with reflex to exon 12-15, BCR-ABL, epo level, carbon monoxide level, ferritin, iron studies. 4.  RTC in 1 week for MD assessment, review of testing, and discussion regarding direction of therapy.   Lequita Asal, MD  01/04/2017, 2:28 PM   I saw and evaluated the patient, participating in the key portions of the service and reviewing pertinent diagnostic studies and records.  I reviewed the nurse practitioner's note and agree with the findings and the plan.  The assessment and plan were discussed with the patient.  Several questions were asked by the patient and answered.   Nolon Stalls, MD 01/04/2017,2:28 PM

## 2017-01-05 ENCOUNTER — Telehealth: Payer: Self-pay | Admitting: *Deleted

## 2017-01-05 LAB — CARBON MONOXIDE, BLOOD (PERFORMED AT REF LAB): Carbon Monoxide, Blood: 17 % — ABNORMAL HIGH (ref 0.0–3.6)

## 2017-01-05 LAB — ERYTHROPOIETIN: ERYTHROPOIETIN: 10.7 m[IU]/mL (ref 2.6–18.5)

## 2017-01-05 NOTE — Telephone Encounter (Signed)
Received referral for initial lung cancer screening scan. Contacted patient who requests a call back in 2 weeks to plan for scan in January.

## 2017-01-09 ENCOUNTER — Encounter: Payer: Self-pay | Admitting: Hematology and Oncology

## 2017-01-09 ENCOUNTER — Ambulatory Visit: Payer: Medicare HMO

## 2017-01-11 ENCOUNTER — Inpatient Hospital Stay: Payer: Medicare HMO | Admitting: Hematology and Oncology

## 2017-01-11 LAB — BCR-ABL1, CML/ALL, PCR, QUANT

## 2017-01-11 NOTE — Progress Notes (Deleted)
[facility] day:  01/11/2017   Chief Complaint: Alicia Holder is a 66 y.o. 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  HPI:   The patient was last seen in the hematology clinic on 01/04/2017 for initial consultation regarding polycythemia.  Prior labs were not available.  She had a 60+ pack year smoking history.  She denied sleep apnea.  Symptomatically, she felt good.  Exam revealed nicotine stained fingers and nails.  She underwent a work-up.  CBC revealed a hematocrit of 50.8, hemoglobin 17, MCV 94.7, platelets 173,000, white count 7400 with an ANC of 5100. Differential included 68% segs, 21% lymphs, 8% monocytes, 2% eosinophils and 1% basophils.  CMP was unremarkable.  Epo level was 10.7.  Carbon monoxide level was 17% (< 9% smokers).  Ferritin was 27 with an iron saturation of 6% and a TIBC of 392.  JAK2 and BCR-ABL are pending.  She was referred to the low dose chest CT program.  Notes indicate CT will be scheduled in 01/2017.  Symptomatically,   Past Medical History:  Diagnosis Date  . Colon cancer [facility])     Past Surgical History:  Procedure Laterality Date  . ABDOMINAL HYSTERECTOMY    . APPENDECTOMY      No family history on file.  Social History:  reports that she has been smoking.  She has a 41.00 pack-year smoking history. she has never used smokeless tobacco. She reports that she drinks alcohol. Her drug history is not on file.  She states that she smokes 3 ppd x 20 years.  She now smokes a pack of cigarettes a day.  She drinks 1-2 alcoholic drinks/month.  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  Patient denies known exposures to radiation or toxins. She lives with her mentally challenged daughter, Alicia Holder.  Her other daughter, Alicia Holder, helps out.  Patient does not drive.  She lives in a trailer in [place] that is near her daughter,  Alicia Holder.  She has several grandchildren.  She has a GED and geriatric nursing assistant degree.  The patient is alone today.  Allergies: No Known Allergies  Current Medications: Current Outpatient Medications  Medication Sig Dispense Refill  . ADVAIR DISKUS 250-50 MCG/DOSE AEPB Inhale 1 puff into the lungs 2 (two) times daily.    Marland Kitchen docusate sodium (COLACE) 100 MG capsule Take 100 mg by mouth daily.    Marland Kitchen lisinopril (PRINIVIL,ZESTRIL) 10 MG tablet Take 10 mg by mouth daily.    . montelukast (SINGULAIR) 10 MG tablet Take 10 mg by mouth at bedtime.    Marland Kitchen oxyCODONE (OXY IR/ROXICODONE) 5 MG immediate release tablet Take 5 mg by mouth 3 (three) times daily.    . sertraline (ZOLOFT) 50 MG tablet Take 50 mg by mouth daily.    . simvastatin (ZOCOR) 20 MG tablet Take 20 mg by mouth daily.     No current facility-administered medications for this visit.     Review of Systems:  GENERAL:  Feels "great".  No fevers, sweats or unintentional weight loss (see HPI). PERFORMANCE STATUS (ECOG):  0 HEENT:  No visual changes, runny nose, sore throat, mouth sores or tenderness. Lungs: COPD. Orthopnea with associated cough. No shortness of breath at rest while upright.  No hemoptysis.  No sleep apnea. Cardiac:  No chest pain, palpitations, orthopnea, or PND. GI:  Opiod induced constipation.  No nausea, vomiting,  diarrhea, melena or hematochezia. GU:  No urgency, frequency, dysuria, or hematuria. Musculoskeletal:  No back pain.  Fingers sore.  No muscle tenderness. Extremities:  No pain or swelling. Skin:  No rashes or skin changes. Neuro:  No headache, numbness or weakness, balance or coordination issues. Endocrine:  No diabetes, thyroid issues, hot flashes or night sweats. Psych:  No mood changes.  Depression on Zoloft.  No anxiety. Pain:  No focal pain. Review of systems:  All other systems reviewed and found to be negative.  Physical Exam: There were no vitals taken for this visit. GENERAL:  Well  developed, well nourished, woman sitting comfortably in the exam room in no acute distress. MENTAL STATUS:  Alert and oriented to person, place and time. HEAD:  Short red hair.  Normocephalic, atraumatic, face symmetric, no Cushingoid features. EYES:  Pink rimmed glasses.  Blue eyes.  Pupils equal round and reactive to light and accomodation.  No conjunctivitis or scleral icterus. ENT:  Oropharynx clear without lesion.  Tongue normal.  No upper teeth.  Four lower teeth.  Mucous membranes moist.  RESPIRATORY:  Decreased respiratory excursion with decreased breath sounds at the bases.  Clear to auscultation without rales, wheezes or rhonchi. CARDIOVASCULAR:  Regular rate and rhythm without murmur, rub or gallop. ABDOMEN:  Umbilical hernia. Soft, non-tender, with active bowel sounds, and no hepatosplenomegaly.  No masses. SKIN:  Fingertips and nails are black from unfiltered cigarette use. No rashes, ulcers or lesions. EXTREMITIES: No edema, no skin discoloration or tenderness.  No palpable cords. LYMPH NODES: No palpable cervical, supraclavicular, axillary or inguinal adenopathy  NEUROLOGICAL: Unremarkable. PSYCH:  Appropriate.   No visits with results within 3 Day(s) from this visit.  Latest known visit with results is:  Appointment on 01/04/2017  Component Date Value Ref Range Status  . Iron 01/04/2017 23* 28 - 170 ug/dL Final  . TIBC 01/04/2017 392  250 - 450 ug/dL Final  . Saturation Ratios 01/04/2017 6* 10.4 - 31.8 % Final  . UIBC 01/04/2017 369  ug/dL Final  . Ferritin 01/04/2017 27  11 - 307 ng/mL Final  . Carbon Monoxide, Blood 01/04/2017 17.0* 0.0 - 3.6 % Final   Comment: (NOTE)                            Environmental Exposure:                             Nonsmokers           <3.7                             Smokers              <9.9                            Occupational Exposure:                             BEI                   3.5                                Detection  Limit =  0.2 **Verified by repeat analysis** Performed At: [facility] [address] [place], [place] [id number] Nagendra Sanjai MD Ph:[id number]   . Erythropoietin 01/04/2017 10.7  2.6 - 18.5 mIU/mL Final   Comment: (NOTE) [employer] UniCel DxI 800 Immunoassay System Performed At: [facility] [place] [address] [place], [place] [id number] Nagendra Sanjai MD Ph:[id number]   . Sodium 01/04/2017 136  135 - 145 mmol/L Final  . Potassium 01/04/2017 4.7  3.5 - 5.1 mmol/L Final  . Chloride 01/04/2017 100* 101 - 111 mmol/L Final  . CO2 01/04/2017 27  22 - 32 mmol/L Final  . Glucose, Bld 01/04/2017 98  65 - 99 mg/dL Final  . BUN 01/04/2017 9  6 - 20 mg/dL Final  . Creatinine, Ser 01/04/2017 0.82  0.44 - 1.00 mg/dL Final  . Calcium 01/04/2017 8.7* 8.9 - 10.3 mg/dL Final  . Total Protein 01/04/2017 7.1  6.5 - 8.1 g/dL Final  . Albumin 01/04/2017 3.9  3.5 - 5.0 g/dL Final  . AST 01/04/2017 17  15 - 41 U/L Final  . ALT 01/04/2017 14  14 - 54 U/L Final  . Alkaline Phosphatase 01/04/2017 38  38 - 126 U/L Final  . Total Bilirubin 01/04/2017 0.6  0.3 - 1.2 mg/dL Final  . GFR calc non Af Amer 01/04/2017 >60  >60 mL/min Final  . GFR calc Af Amer 01/04/2017 >60  >60 mL/min Final   Comment: (NOTE) The eGFR has been calculated using the CKD EPI equation. This calculation has not been validated in all clinical situations. eGFR's persistently <60 mL/min signify possible Chronic Kidney Disease.   . Anion gap 01/04/2017 9  5 - 15 Final  . WBC 01/04/2017 7.4  3.6 - 11.0 K/uL Final  . RBC 01/04/2017 5.37* 3.80 - 5.20 MIL/uL Final  . Hemoglobin 01/04/2017 17.0* 12.0 - 16.0 g/dL Final  . HCT 01/04/2017 50.8* 35.0 - 47.0 % Final  . MCV 01/04/2017 94.7  80.0 - 100.0 fL Final  . MCH 01/04/2017 31.7  26.0 - 34.0 pg Final  . MCHC 01/04/2017 33.4  32.0 - 36.0 g/dL Final  . RDW 01/04/2017 14.8* 11.5 - 14.5 % Final  . Platelets 01/04/2017 173  150 - 440 K/uL Final  . Neutrophils Relative %  01/04/2017 68  % Final  . Neutro Abs 01/04/2017 5.1  1.4 - 6.5 K/uL Final  . Lymphocytes Relative 01/04/2017 21  % Final  . Lymphs Abs 01/04/2017 1.5  1.0 - 3.6 K/uL Final  . Monocytes Relative 01/04/2017 8  % Final  . Monocytes Absolute 01/04/2017 0.6  0.2 - 0.9 K/uL Final  . Eosinophils Relative 01/04/2017 2  % Final  . Eosinophils Absolute 01/04/2017 0.1  0 - 0.7 K/uL Final  . Basophils Relative 01/04/2017 1  % Final  . 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  She has a 60+ pack year smoking history.  She denies sleep apnea.   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  Last colonoscopy was 10/2012 (next due 2019 per patient).  She has lost 120 pounds in 3 years with portion control and food choices.  Symptomatically, she feels good.  Exam reveals tobacco stained fingernails.  Plan: 1.  Review results of work-up. 2.   Discuss erythrocytosis.  Discuss secondary and primary causes.  Encourage smoking cessation.  Discuss laboratory work-up. 2.  Discuss low dose chest   CT program. 3.  Labs today: CBC with diff, CMP, JAK2 with reflex to exon 12-15, BCR-ABL, epo level, carbon monoxide level, ferritin, iron studies. 4.  RTC in 1 week for MD assessment, review of testing, and discussion regarding direction of therapy.   Melissa C Corcoran, MD  01/11/2017, 6:28 AM   I saw and evaluated the patient, participating in the key portions of the service and reviewing pertinent diagnostic studies and records.  I reviewed the nurse practitioner's note and agree with the findings and the plan.  The assessment and plan were discussed with the patient.  Several questions were asked by the patient and answered.   Melissa Corcoran, MD 01/11/2017, 6:28 AM  

## 2017-01-13 LAB — JAK2 EXONS 12-15

## 2017-01-13 LAB — JAK2  V617F QUAL. WITH REFLEX TO EXON 12: Reflex:: 15

## 2017-02-01 ENCOUNTER — Inpatient Hospital Stay: Payer: Medicare HMO | Attending: Hematology and Oncology | Admitting: Hematology and Oncology

## 2017-02-01 ENCOUNTER — Other Ambulatory Visit: Payer: Self-pay | Admitting: *Deleted

## 2017-02-01 VITALS — BP 154/95 | HR 76 | Temp 96.7°F | Resp 20 | Wt 191.8 lb

## 2017-02-01 DIAGNOSIS — Z85038 Personal history of other malignant neoplasm of large intestine: Secondary | ICD-10-CM | POA: Diagnosis not present

## 2017-02-01 DIAGNOSIS — J449 Chronic obstructive pulmonary disease, unspecified: Secondary | ICD-10-CM

## 2017-02-01 DIAGNOSIS — D751 Secondary polycythemia: Secondary | ICD-10-CM | POA: Diagnosis not present

## 2017-02-01 DIAGNOSIS — Z720 Tobacco use: Secondary | ICD-10-CM | POA: Diagnosis not present

## 2017-02-01 DIAGNOSIS — Z7729 Contact with and (suspected ) exposure to other hazardous substances: Secondary | ICD-10-CM

## 2017-02-01 NOTE — Progress Notes (Signed)
Patient offers no complaints today. 

## 2017-02-01 NOTE — Progress Notes (Signed)
[facility] day:  02/01/2017   Chief Complaint: Alicia Holder is a 67 y.o. 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  HPI:   The patient was last seen in the hematology clinic on 01/04/2017 for initial consultation regarding polycythemia.  Prior labs were not available.  She had a 60+ pack year smoking history.  She denied sleep apnea.  Symptomatically, she felt good.  Exam revealed nicotine stained fingers and nails.  She underwent a work-up.  CBC revealed a hematocrit of 50.8, hemoglobin 17, MCV 94.7, platelets 173,000, white count 7400 with an ANC of 5100. Differential included 68% segs, 21% lymphs, 8% monocytes, 2% eosinophils and 1% basophils.  CMP was unremarkable.  Epo level was 10.7.  Carbon monoxide level was 17% (< 9% smokers).  Ferritin was 27 with an iron saturation of 6% and a TIBC of 392.  JAK2 V617F and exons 12-15 were negative.  BCR-ABL testing was negative for e1a2 (p190), e13a2 (b2a2, p210) and e14a2 (b3a2, p210) fusion transcripts.  She was referred to the low dose chest CT program.  Notes indicate CT will be scheduled in 01/2017.  Symptomatically, she has done well.  She has gained  9 pounds over the holidays.  She describes eating well.  She ate ham yesterday.  She denies any new symptoms.   Past Medical History:  Diagnosis Date  . Colon cancer [facility])     Past Surgical History:  Procedure Laterality Date  . ABDOMINAL HYSTERECTOMY    . APPENDECTOMY      History reviewed. No pertinent family history.  Social History:  reports that she has been smoking.  She has a 41.00 pack-year smoking history. she has never used smokeless tobacco. She reports that she drinks alcohol. Her drug history is not on file.  She states that she smokes 3 ppd x 20 years.  She now smokes a pack of cigarettes a day.  She drinks 1-2 alcoholic drinks/month.  Patient is a retired Quarry manager.  She  also worked in a Clinical cytogeneticist.  Patient denies known exposures to radiation or toxins. She lives with her mentally challenged daughter, [name].  Her other daughter, [name], helps out.  Patient does not drive.  She lives in a trailer in [place] that is near her daughter, [name].  She has several grandchildren.  She has a GED and geriatric nursing assistant degree.  The patient is accompanied by her grand daughter, [place], today.  Allergies: No Known Allergies  Current Medications: Current Outpatient Medications  Medication Sig Dispense Refill  . ADVAIR DISKUS 250-50 MCG/DOSE AEPB Inhale 1 puff into the lungs 2 (two) times daily.    Marland Kitchen docusate sodium (COLACE) 100 MG capsule Take 100 mg by mouth daily.    Marland Kitchen lisinopril (PRINIVIL,ZESTRIL) 10 MG tablet Take 10 mg by mouth daily.    . montelukast (SINGULAIR) 10 MG tablet Take 10 mg by mouth at bedtime.    Marland Kitchen oxyCODONE (OXY IR/ROXICODONE) 5 MG immediate release tablet Take 5 mg by mouth 3 (three) times daily.    . sertraline (ZOLOFT) 50 MG tablet Take 50 mg by mouth daily.    . simvastatin (ZOCOR) 20 MG tablet Take 20 mg by mouth daily.     No current facility-administered medications for this visit.     Review of Systems:  GENERAL:  Feels "great".  No fevers, sweats or unintentional weight loss (see HPI). PERFORMANCE STATUS (  ECOG):  0 HEENT:  No visual changes, runny nose, sore throat, mouth sores or tenderness. Lungs: COPD. Orthopnea with associated cough. No shortness of breath at rest while upright.  No hemoptysis.  No sleep apnea. Cardiac:  No chest pain, palpitations, orthopnea, or PND. GI:  Opiod induced constipation.  No nausea, vomiting, diarrhea, melena or hematochezia. GU:  No urgency, frequency, dysuria, or hematuria. Musculoskeletal:  No back pain.  Fingers sore.  No muscle tenderness. Extremities:  No pain or swelling. Skin:  No rashes or skin changes. Neuro:  No headache, numbness or weakness, balance or coordination  issues. Endocrine:  No diabetes, thyroid issues, hot flashes or night sweats. Psych:  No mood changes.  Depression on Zoloft.  No anxiety. Pain:  No focal pain. Review of systems:  All other systems reviewed and found to be negative.  Physical Exam: Blood pressure (!) 154/95, pulse 76, temperature (!) 96.7 F (35.9 C), temperature source Tympanic, resp. rate 20, weight 191 lb 12.8 oz (87 kg). GENERAL:  Well developed, well nourished, woman sitting comfortably in the exam room in no acute distress.  She smells of smoke. MENTAL STATUS:  Alert and oriented to person, place and time. HEAD:  Short brown hair.  Normocephalic, atraumatic, face symmetric, no Cushingoid features. EYES:  Blue eyes.  No conjunctivitis or scleral icterus. SKIN:  Fingertips and nails are black from unfiltered cigarette use. No rashes, ulcers or lesions. NEUROLOGICAL: Unremarkable. PSYCH:  Appropriate.   No visits with results within 3 Day(s) from this visit.  Latest known visit with results is:  Clinical Support on 01/04/2017  Component Date Value Ref Range Status  . JAK2 GenotypR 01/04/2017 Comment   Final   Comment: (NOTE) Result: NEGATIVE for the JAK2 V617F mutation. Interpretation:  The G to T nucleotide change encoding the V617F mutation was not detected.  This result does not rule out the presence of the JAK2 mutation at a level below the sensitivity of detection of this assay, or the presence of other mutations within JAK2 not detected by this assay.  This result does not rule out a diagnosis of polycythemia vera, essential thrombocythemia or idiopathic myelofibrosis as the V617F mutation is not detected in all patients with these disorders.   Marland Kitchen BACKGROUND: 01/04/2017 Comment   Final   Comment: (NOTE) JAK2 is a cytoplasmic tyrosine kinase with a key role in signal transduction from multiple hematopoietic growth factor receptors. A point mutation within exon 14 of the JAK2 gene ([id number]) encoding  a valine to phenylalanine substitution at position 617 of the JAK2 protein (V617F) has been identified in most patients with polycythemia vera, and in about half of those with either essential thrombocythemia or idiopathic myelofibrosis. The V617F has also been detected, although infrequently, in other myeloid disorders such as chronic myelomonocytic leukemia and chronic neutrophilic luekemia. V617F is an acquired mutation that alters a highly conserved valine present in the negative regulatory JH2 domain of the JAK2 protein and is predicted to dysregulate kinase activity. Methodology: Total genomic DNA was extracted and subjected to TaqMan real-time PCR amplification/detection. Two amplification products per sample were monitored by real-time PCR using primers/probes s                          pecific to JAK2 wild type (WT) and JAK2 mutant V617F. The ABI7900 Absolute Quantitation software will compare the patient specimen valuse to the standard curves and generate percent values for wild type and mutant type. In  vitro studies have indicated that this assay has an analytical sensitivity of 1%. References: Baxter EJ, Scott Phineas Real, et al. Acquired mutation of the tyrosine kinase JAK2 in human myeloproliferative disorders. Lancet. 2005 Mar 19-25; 365(9464):1054-1061. Alfonso Ramus Couedic JP. A unique clonal JAK2 mutation leading to constitutive signaling causes polycythaemia vera. Nature. 2005 Apr 28; 434(7037):1144-1148. Kralovics R, Passamonti F, Buser AS, et al. A gain-of-function mutation of JAK2 in myeloproliferative disorders. N Engl J Med. 2005 Apr 28; 352(17):1779-1790.   . Director Review, JAK2 01/04/2017 Comment   Final   Comment: (NOTE) Constance Goltz, PhD, [facility]               Director, [place] [facility] [place], [place]               [phone] This test  was developed and its performance characteristics determined by [facility]. It has not been cleared or approved by the Food and Drug Administration.   . Reflex: 01/04/2017 15 Mutation Analysis is indicated.   Corrected   Comment: Comment Reflex to JAK2 Exon 12   . Extraction 01/04/2017 Completed   Corrected   Comment: (NOTE) Performed At: [facility] [address] [place], [place] [id number] Nechama Guard MD WU:[id number] Performed At: [facility] [place], [place] [id number] Nechama Guard MD VH:[id number]   . Iron 01/04/2017 23* 28 - 170 ug/dL Final  . TIBC 01/04/2017 392  250 - 450 ug/dL Final  . Saturation Ratios 01/04/2017 6* 10.4 - 31.8 % Final  . UIBC 01/04/2017 369  ug/dL Final  . Ferritin 01/04/2017 27  11 - 307 ng/mL Final  . Carbon Monoxide, Blood 01/04/2017 17.0* 0.0 - 3.6 % Final   Comment: (NOTE)                            Environmental Exposure:                             Nonsmokers           <3.7                             Smokers              <9.9                            Occupational Exposure:                             BEI                   3.5                                Detection Limit =  0.2 **Verified by repeat analysis** Performed At: [facility] [place], [place] [id number] Rush Farmer MD UV:[id number]   . Erythropoietin  01/04/2017 10.7  2.6 - 18.5 mIU/mL Final   Comment: (NOTE) Beckman Coulter UniCel DxI 800 Immunoassay System Performed At: [facility] [place], [place] [id number] Rush Farmer MD NO:[id number]   . b2a2 transcript 01/04/2017 Comment  % Final   Comment: (NOTE)           <0.0032 % (sensitivity limit of assay)   . b3a2 transcript 01/04/2017 Comment  % Final   Comment: (NOTE)           <0.0032 % (sensitivity limit of assay)   . E1A2 Transcript 01/04/2017 Comment  % Final   Comment: (NOTE)           <0.0032 % (sensitivity limit of  assay)   . Interpretation (BCRAL): 01/04/2017 Comment   Final   Comment: (NOTE) NEGATIVE for the BCR-ABL1 e1a2 (p190), e13a2 (b2a2, p210) and e14a2 (b3a2, p210) fusion transcripts. These results do not rule out the presence of rare BCR-ABL1 transcripts not detected by this assay.   . Director Review (BCRAL): 01/04/2017 Comment   Final   Comment: (NOTE) Katina Degree, MD, PhD Director, [facility] [place], [place] [address] [phone]   . Background: 01/04/2017 Comment   Corrected   Comment: (NOTE) This assay can detect three different types of BCR-ABL1 fusion transcripts associated with CML, ALL, and AML: e13a2 (previously b2a2) and e14a2 (previously b3a2) (major breakpoint, p210), as well as e1a2 (minor breakpoint, p190). The e13a2 and e14a2 transcript values are titrated to the current International Scale (IS). The standardized baseline is 100% BCR-ABL1 (IS) and major molecular response (MMR) is equivalent to 0.1% BCR-ABL1 (IS) corresponding to a 3-log reduction. Results should be correlated with appropriate clinical and laboratory information as indicated.   . Methodology 01/04/2017 Comment   Corrected   Comment: (NOTE) Total RNA is isolated from the sample and subject to a real-time, reverse transcriptase polymerase chain reaction (RT-PCR). The PCR primers and probes are specific for BCR-ABL1 e13a2, e14a2 and e1a2 fusion transcripts. The ABL1 transcript is amplified as the control for cDNA quantity and quality. Serial dilutions of a validated positive control RNA with known t(9;22) BCR-ABL1 are used as reference for quantification of BCR-ABL1 relative to ABL1. The numeric BCR-ABL1 level is reportd as % BCR-ABL1/ABL1 and the detection sensitivity is 4.5 log below the standard baseline. This test was developed and its performance characteristics determined by [facility]. It has not been cleared or approved by  the Food and Drug Administration. References:    1. Anastasia Fiedler and Branford S: Seminars in Hematology 2003;       40 (suppl2):62-68.    2. White HE, et al. Blood 2010; 116: e111-117.    3. NCCN Clinical Practice Guidelines in Oncology, Chronic       Myeloid Leukemia. V2. 2017.                           Performed At: [facility] [address] [place], [place] [id number] Nechama Guard MD EX:[id number] Performed At: [facility] [place], [place] [id number] Nechama Guard MD WG:[id number]   . Sodium 01/04/2017 136  135 - 145 mmol/L Final  . Potassium 01/04/2017 4.7  3.5 - 5.1 mmol/L Final  . Chloride 01/04/2017 100* 101 - 111 mmol/L Final  . CO2 01/04/2017 27  22 - 32 mmol/L Final  . Glucose, Bld 01/04/2017 98  65 - 99 mg/dL Final  .  BUN 01/04/2017 9  6 - 20 mg/dL Final  . Creatinine, Ser 01/04/2017 0.82  0.44 - 1.00 mg/dL Final  . Calcium 01/04/2017 8.7* 8.9 - 10.3 mg/dL Final  . Total Protein 01/04/2017 7.1  6.5 - 8.1 g/dL Final  . Albumin 01/04/2017 3.9  3.5 - 5.0 g/dL Final  . AST 01/04/2017 17  15 - 41 U/L Final  . ALT 01/04/2017 14  14 - 54 U/L Final  . Alkaline Phosphatase 01/04/2017 38  38 - 126 U/L Final  . Total Bilirubin 01/04/2017 0.6  0.3 - 1.2 mg/dL Final  . GFR calc non Af Amer 01/04/2017 >60  >60 mL/min Final  . GFR calc Af Amer 01/04/2017 >60  >60 mL/min Final   Comment: (NOTE) The eGFR has been calculated using the CKD EPI equation. This calculation has not been validated in all clinical situations. eGFR's persistently <60 mL/min signify possible Chronic Kidney Disease.   . Anion gap 01/04/2017 9  5 - 15 Final  . WBC 01/04/2017 7.4  3.6 - 11.0 K/uL Final  . RBC 01/04/2017 5.37* 3.80 - 5.20 MIL/uL Final  . Hemoglobin 01/04/2017 17.0* 12.0 - 16.0 g/dL Final  . HCT 01/04/2017 50.8* 35.0 - 47.0 % Final  . MCV 01/04/2017 94.7  80.0 - 100.0 fL Final  . MCH 01/04/2017 31.7  26.0 - 34.0 pg Final  . MCHC 01/04/2017 33.4  32.0 -  36.0 g/dL Final  . RDW 01/04/2017 14.8* 11.5 - 14.5 % Final  . Platelets 01/04/2017 173  150 - 440 K/uL Final  . Neutrophils Relative % 01/04/2017 68  % Final  . Neutro Abs 01/04/2017 5.1  1.4 - 6.5 K/uL Final  . Lymphocytes Relative 01/04/2017 21  % Final  . Lymphs Abs 01/04/2017 1.5  1.0 - 3.6 K/uL Final  . Monocytes Relative 01/04/2017 8  % Final  . Monocytes Absolute 01/04/2017 0.6  0.2 - 0.9 K/uL Final  . Eosinophils Relative 01/04/2017 2  % Final  . Eosinophils Absolute 01/04/2017 0.1  0 - 0.7 K/uL Final  . Basophils Relative 01/04/2017 1  % Final  . Basophils Absolute 01/04/2017 0.1  0 - 0.1 K/uL Final  . JAK2 Exons 12-15 Mut Det PCR: 01/04/2017 Comment   Final   Comment: (NOTE) NEGATIVE JAK2 mutations were not detected in exons 12, 13, 14 and 15. This result does not rule out the presence of JAK2 mutation at a level below the detection sensitivity of this assay, the presence of other mutations outside the analyzed region of the JAK2 gene, or the presence of a myeloproliferative or other neoplasm. Result must be correlated with other clinical data for the most accurate diagnosis.   . Indications 01/04/2017 Comment:   Final   NO INDICATION SPECIFIED  . Specimen Type 01/04/2017 Comment   Final   No specimen type provided.  Marland Kitchen BACKGROUND: 01/04/2017 Comment   Final   Comment: (NOTE) JAK2 V617F mutation is detected in patients with polycythemia vera (95%), essential thrombocythemia (50%) and primary myelofibrosis (50%). A small percentage of JAK2 mutation positive patients (3.3%) contain other non-V617F mutations within exons 12 to 15. The detection of a JAK2 gene mutation aids in the specific diagnosis of a myeloproliferative neoplasm, and help distinguish this clonal disease from a benign reactive process.   . Method 01/04/2017 Comment   Final   Comment: (NOTE) Total RNA was purified from the provided specimen. The JAK2 gene region covering exons 12 to 15 was subjected to  reverse- transcription coupled PCR  amplification, and bi-directional sequencing to identify sequence variations. This assay has a sensitivity to detect approximately 15% population of cells containing the JAK2 mutations in a background of non-mutant cells. This test was developed and its performance characteristics determined by [facility]. It has not been cleared or approved by the Food and Drug Administration.   . References 01/04/2017 Comment   Final   Comment: (NOTE) Algasham, N. et al. Detection of mutations in JAK2 exons 12-15 by Sanger sequencing. Int J Lab Hemato. 2015, 38:34-41. [name] al. Mutation profile of JAK2 transcripts in patients with chronic myeloproliferative neoplasias. J Mol Diagn. 2009, 11:49-53.   Marland Kitchen DIRECTOR REVIEW: 01/04/2017 Comment   Final   Comment: (NOTE) Loni Muse, PhD  Director, [facility]  [address] [place], [place] [address]  [phone] Performed At: [facility] [place], [place] [id number] Nechama Guard MD ZY:[id number] Performed At: [facility] [address] 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  She has a 60+ pack year smoking history.  She denies sleep apnea.   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  Last colonoscopy was 10/2012 (next due 2019 per patient).  She has lost 120 pounds in 3 years with portion control and food choices.  Work-up on 01/04/2017 revealed a hematocrit of 50.8, hemoglobin 17, MCV 94.7, platelets 173,000, white count 7400 with an ANC of 5100. Differential included 68% segs, 21% lymphs, 8% monocytes, 2% eosinophils and 1% basophils.  Normal labs included CMP, epo level (10.7), ferritin (27), with an iron saturation (6%), JAK2 V617F and exons 12-15 were negative.  BCR-ABL testing was negative for e1a2 (p190),  e13a2 (b2a2, p210) and e14a2 (b3a2, p210) fusion transcripts. Carbon monoxide level was 17% (< 9% smokers).    Symptomatically, she feels good.  Exam reveals tobacco stained fingernails.  Plan: 1.  Review results of work-up.  No evidence of a myeloproliferative disorder.  Etiology felt secondary to smoking.  Discuss elevated carbon monoxide level likely due to smoking.  Recommend house check to ensure no elevated levels of carbon monoxide.  Encourage smoking cessation.  Discuss small volume (300 cc) phlebotomy to maintain a hematocrit < 47.   2.  Discuss low dose chest CT program.  Contact Burgess Estelle, RN. 3.  RTC every other week for HCT +/- phlebotomy (start next Wednesday) 4.  Pulmonary referral- COPD. 5.  RTC in 8 weeks (day she would comes in for possible phlebotomy) for MD assessment, labs (CBC with diff, ferritin) +/- phlebotomy.   Lequita Asal, MD  02/01/2017, 3:35PM

## 2017-02-08 ENCOUNTER — Inpatient Hospital Stay: Payer: Medicare HMO

## 2017-02-08 VITALS — BP 178/92 | HR 89 | Temp 97.0°F | Resp 20

## 2017-02-08 DIAGNOSIS — D751 Secondary polycythemia: Secondary | ICD-10-CM | POA: Diagnosis not present

## 2017-02-08 LAB — HEMATOCRIT: HCT: 47.1 % — ABNORMAL HIGH (ref 35.0–47.0)

## 2017-02-08 NOTE — Patient Instructions (Signed)

## 2017-02-19 ENCOUNTER — Encounter: Payer: Self-pay | Admitting: Hematology and Oncology

## 2017-02-21 ENCOUNTER — Telehealth: Payer: Self-pay | Admitting: *Deleted

## 2017-02-21 DIAGNOSIS — Z122 Encounter for screening for malignant neoplasm of respiratory organs: Secondary | ICD-10-CM

## 2017-02-21 DIAGNOSIS — Z87891 Personal history of nicotine dependence: Secondary | ICD-10-CM

## 2017-02-21 NOTE — Telephone Encounter (Signed)
Received referral for initial lung cancer screening scan. Contacted patient and obtained smoking history,(current, 41 pack year) as well as answering questions related to screening process. Patient denies signs of lung cancer such as weight loss or hemoptysis. Patient denies comorbidity that would prevent curative treatment if lung cancer were found. Patient is scheduled for shared decision making visit and CT scan on 03/16/17.

## 2017-02-22 ENCOUNTER — Other Ambulatory Visit: Payer: Self-pay | Admitting: Urgent Care

## 2017-02-22 ENCOUNTER — Inpatient Hospital Stay: Payer: Medicare HMO

## 2017-02-22 DIAGNOSIS — D751 Secondary polycythemia: Secondary | ICD-10-CM

## 2017-02-22 LAB — HEMATOCRIT: HCT: 46.2 % (ref 35.0–47.0)

## 2017-03-08 ENCOUNTER — Inpatient Hospital Stay: Payer: Medicare HMO

## 2017-03-08 ENCOUNTER — Other Ambulatory Visit: Payer: Self-pay | Admitting: Urgent Care

## 2017-03-08 ENCOUNTER — Inpatient Hospital Stay: Payer: Medicare HMO | Attending: Hematology and Oncology

## 2017-03-08 DIAGNOSIS — D751 Secondary polycythemia: Secondary | ICD-10-CM

## 2017-03-08 DIAGNOSIS — Z79899 Other long term (current) drug therapy: Secondary | ICD-10-CM | POA: Insufficient documentation

## 2017-03-08 DIAGNOSIS — Z720 Tobacco use: Secondary | ICD-10-CM | POA: Insufficient documentation

## 2017-03-08 DIAGNOSIS — I712 Thoracic aortic aneurysm, without rupture: Secondary | ICD-10-CM | POA: Insufficient documentation

## 2017-03-08 DIAGNOSIS — E041 Nontoxic single thyroid nodule: Secondary | ICD-10-CM | POA: Insufficient documentation

## 2017-03-08 LAB — HEMATOCRIT: HCT: 46.8 % (ref 35.0–47.0)

## 2017-03-08 NOTE — Progress Notes (Signed)
Hematocrit was 46.8 today and phlebotomy not needed.  Patient was informed. No acute distress noted.

## 2017-03-09 ENCOUNTER — Inpatient Hospital Stay: Payer: Medicare HMO

## 2017-03-15 ENCOUNTER — Institutional Professional Consult (permissible substitution): Payer: Medicare HMO | Admitting: Internal Medicine

## 2017-03-16 ENCOUNTER — Encounter: Payer: Self-pay | Admitting: Internal Medicine

## 2017-03-16 ENCOUNTER — Inpatient Hospital Stay: Payer: Medicare HMO | Attending: Oncology | Admitting: Oncology

## 2017-03-16 ENCOUNTER — Ambulatory Visit
Admission: RE | Admit: 2017-03-16 | Discharge: 2017-03-16 | Disposition: A | Discharge status: Home / Self Care | Payer: Medicare HMO | Source: Ambulatory Visit | Attending: Oncology | Admitting: Oncology

## 2017-03-16 ENCOUNTER — Ambulatory Visit (INDEPENDENT_AMBULATORY_CARE_PROVIDER_SITE_OTHER): Payer: Medicare HMO | Admitting: Internal Medicine

## 2017-03-16 VITALS — BP 120/90 | HR 108 | Ht 63.0 in | Wt 186.0 lb

## 2017-03-16 DIAGNOSIS — F17210 Nicotine dependence, cigarettes, uncomplicated: Secondary | ICD-10-CM | POA: Diagnosis not present

## 2017-03-16 DIAGNOSIS — K449 Diaphragmatic hernia without obstruction or gangrene: Secondary | ICD-10-CM | POA: Diagnosis not present

## 2017-03-16 DIAGNOSIS — J439 Emphysema, unspecified: Secondary | ICD-10-CM | POA: Insufficient documentation

## 2017-03-16 DIAGNOSIS — Q278 Other specified congenital malformations of peripheral vascular system: Secondary | ICD-10-CM | POA: Diagnosis not present

## 2017-03-16 DIAGNOSIS — E042 Nontoxic multinodular goiter: Secondary | ICD-10-CM | POA: Diagnosis not present

## 2017-03-16 DIAGNOSIS — G4719 Other hypersomnia: Secondary | ICD-10-CM

## 2017-03-16 DIAGNOSIS — I2510 Atherosclerotic heart disease of native coronary artery without angina pectoris: Secondary | ICD-10-CM | POA: Insufficient documentation

## 2017-03-16 DIAGNOSIS — J449 Chronic obstructive pulmonary disease, unspecified: Secondary | ICD-10-CM | POA: Diagnosis not present

## 2017-03-16 DIAGNOSIS — Z87891 Personal history of nicotine dependence: Secondary | ICD-10-CM

## 2017-03-16 DIAGNOSIS — I700 Atherosclerosis of aorta: Secondary | ICD-10-CM | POA: Diagnosis not present

## 2017-03-16 DIAGNOSIS — Z122 Encounter for screening for malignant neoplasm of respiratory organs: Secondary | ICD-10-CM | POA: Diagnosis present

## 2017-03-16 MED ORDER — NICOTINE 21 MG/24HR TD PT24: 21.0000 mg | MEDICATED_PATCH | TRANSDERMAL | 1 refills | Status: DC

## 2017-03-16 NOTE — Progress Notes (Signed)
In accordance with CMS guidelines, patient has met eligibility criteria including age, absence of signs or symptoms of lung cancer.  Social History   Tobacco Use  . Smoking status: Current Some Day Smoker    Packs/day: 1.00    Years: 41.00    Pack years: 41.00  . Smokeless tobacco: Never Used  Substance Use Topics  . Alcohol use: Yes    Comment: Once every 3 weeks  . Drug use: Not on file     A shared decision-making session was conducted prior to the performance of CT scan. This includes one or more decision aids, includes benefits and harms of screening, follow-up diagnostic testing, over-diagnosis, false positive rate, and total radiation exposure.  Counseling on the importance of adherence to annual lung cancer LDCT screening, impact of co-morbidities, and ability or willingness to undergo diagnosis and treatment is imperative for compliance of the program.  Counseling on the importance of continued smoking cessation for former smokers; the importance of smoking cessation for current smokers, and information about tobacco cessation interventions have been given to patient including [address] and [address] quit [place] programs.  Written order for lung cancer screening with LDCT has been given to the patient and any and all questions have been answered to the best of my abilities.   Yearly follow up will be coordinated by Burgess Estelle, Thoracic Navigator.  Faythe Casa, NP 03/16/2017 3:24 PM

## 2017-03-16 NOTE — Progress Notes (Signed)
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 -Continue Advair/Spiriva. -We will send for pulmonary function testing.  Atelectasis. -Lingular atelectasis seen on CT lung cancer screening. -We will await official radiology report.  Polycythemia. -Uncertain etiology, suspect this is related to smoking, nocturnal hypoxia, possibly obstructive sleep apnea. -Workup with pulmonary function test, sleep study.  Excessive daytime sleepiness. - Symptoms and signs of obstructive sleep apnea. - We will send for sleep study.  Nicotine abuse. -We discussed today the importance of smoking cessation, she appears to be committed to trying, especially given new findings of polycythemia. -We discussed various ways of quitting, encouraged her to set a quit date, which she has sent in March.  She is prescribed nicotine patches, she has had Chantix in the past and it caused her to be very angry/moody.    Orders Placed This Encounter  Procedures  . Pulmonary Function Test [facility] Only  . Home sleep test   Meds ordered this encounter  Medications  . nicotine (NICODERM CQ - DOSED IN MG/24 HOURS) 21 mg/24hr patch    Sig: Place 1 patch (21 mg total) onto the skin daily.    Dispense:  30 patch    Refill:  1   Return in about 2 months (around 05/14/2017).    Date: 03/16/2017  MRN# [id number] Alicia Holder 1950-08-20   Alicia Holder is a 67 y.o. old female seen in consultation for chief complaint of:    Chief Complaint  Patient presents with  . Advice Only    ref by Mike Gip: SOB w/activity; prod cough w/clear mucus    HPI:   Patient is a 67 year old female she is referred from the oncology clinic for a history of polycythemia.  Patient has a long history of smoking, CBC showed a hemoglobin of 7, elevated RBC of 5.37, carbon monoxide level was 17%, other workup was negative for  malignancy and appeared consistent with polycythemia  She feels that her breathing is ok "most of the time" but when she walks more than 2-3 blocks she will get out of breath. She has been diagnosed with COPD, she is taking advair bid, and rinses mouth after use. She takes singulaie nightly. She was on combivent and spiriva in the past but her breathing got better since losing 120 pounds and she is no longer on those inhalers.   She is smoking 1 ppd, down from 2.5 ppd for 20 years. She has quit when she was pregnant, she is thinking about quitting now and would like to. She has tried chantix but it made it caused her to have angry mood.   She thinks that she has had a sleep study in the past, she does not recall results, but she was not recommended to start CPAP.    Images personally reviewed 03/16/17; CT low dose which showed some lingular atelectasis. Emphysematous changes.   Results for Alicia Holder, Alicia Holder (MRN [id number]) as of 03/16/2017 11:42  Ref. Range 01/04/2017 14:58  WBC Latest Ref Range: 3.6 - 11.0 K/uL 7.4  RBC Latest Ref Range: 3.80 - 5.20 MIL/uL 5.37 (H)  Hemoglobin Latest Ref Range: 12.0 - 16.0 g/dL 17.0 (H)  HCT Latest Ref Range: 35.0 - 47.0 % 50.8 (H)  MCV Latest Ref Range: 80.0 - 100.0 fL 94.7  MCH Latest Ref Range: 26.0 - 34.0 pg 31.7  MCHC Latest Ref Range: 32.0 - 36.0 g/dL 33.4  RDW Latest Ref Range: 11.5 - 14.5 % 14.8 (H)  Platelets Latest Ref Range: 150 - 440 K/uL 173  Neutrophils Latest Units: % 68  Lymphocytes Latest Units: % 21  Monocytes Relative Latest Units: % 8  Eosinophil Latest Units: % 2  Basophil Latest Units: % 1  NEUT# Latest Ref Range: 1.4 - 6.5 K/uL 5.1  Lymphocyte # Latest Ref Range: 1.0 - 3.6 K/uL 1.5  Monocyte # Latest Ref Range: 0.2 - 0.9 K/uL 0.6  Eosinophils Absolute Latest Ref Range: 0 - 0.7 K/uL 0.1  Basophils Absolute Latest Ref Range: 0 - 0.1 K/uL 0.1      PMHX:   Past Medical History:  Diagnosis Date  . Colon cancer [facility])     Surgical Hx:  Past Surgical History:  Procedure Laterality Date  . ABDOMINAL HYSTERECTOMY    . APPENDECTOMY     Family Hx:  History reviewed. No pertinent family history. Social Hx:   Social History   Tobacco Use  . Smoking status: Current Some Day Smoker    Packs/day: 1.00    Years: 41.00    Pack years: 41.00  . Smokeless tobacco: Never Used  Substance Use Topics  . Alcohol use: Yes    Comment: Once every 3 weeks  . Drug use: Not on file   Medication:    Current Outpatient Medications:  .  ADVAIR DISKUS 250-50 MCG/DOSE AEPB, Inhale 1 puff into the lungs 2 (two) times daily., Disp: , Rfl:  .  docusate sodium (COLACE) 100 MG capsule, Take 100 mg by mouth daily., Disp: , Rfl:  .  lisinopril (PRINIVIL,ZESTRIL) 10 MG tablet, Take 10 mg by mouth daily., Disp: , Rfl:  .  montelukast (SINGULAIR) 10 MG tablet, Take 10 mg by mouth at bedtime., Disp: , Rfl:  .  oxyCODONE (OXY IR/ROXICODONE) 5 MG immediate release tablet, Take 5 mg by mouth 3 (three) times daily., Disp: , Rfl:  .  sertraline (ZOLOFT) 50 MG tablet, Take 50 mg by mouth daily., Disp: , Rfl:  .  simvastatin (ZOCOR) 20 MG tablet, Take 20 mg by mouth daily., Disp: , Rfl:    Allergies:  Patient has no known allergies.  Review of Systems: Gen:  Denies  fever, sweats, chills HEENT: Denies blurred vision, double vision. bleeds, sore throat Cvc:  No dizziness, chest pain. Resp:   Denies cough or sputum production, shortness of breath Gi: Denies swallowing difficulty, stomach pain. Gu:  Denies bladder incontinence, burning urine Ext:   No Joint pain, stiffness. Skin: No skin rash,  hives  Endoc:  No polyuria, polydipsia. Psych: No depression, insomnia. Other:  All other systems were reviewed with the patient and were negative other that what is mentioned in the HPI.   Physical Examination:   VS: BP 120/90 (BP Location: Left Arm, Cuff Size: Normal)   Pulse (!) 108   Ht 5\' 3"  (1.6 m)   Wt 186 lb (84.4 kg)   SpO2  93%   BMI 32.95 kg/m   General Appearance: No distress  Neuro:without focal findings,  speech normal,  HEENT: PERRLA, EOM intact.   Pulmonary: normal breath sounds, No wheezing. Decreased air entry bilaterally.  CardiovascularNormal S1,S2.  No m/r/g.   Abdomen: Benign, Soft, non-tender. Renal:  No costovertebral tenderness  GU:  No performed at this time. Endoc: No evident thyromegaly, no signs of acromegaly. Skin:   warm, no rashes, no ecchymosis  Extremities: normal, no cyanosis, clubbing.  Other findings:    LABORATORY PANEL:  CBC No results for input(s): WBC, HGB, HCT, PLT in the last 168 hours. ------------------------------------------------------------------------------------------------------------------  Chemistries  No results for input(s): NA, K, CL, CO2, GLUCOSE, BUN, CREATININE, CALCIUM, MG, AST, ALT, ALKPHOS, BILITOT in the last 168 hours.  Invalid input(s): GFRCGP ------------------------------------------------------------------------------------------------------------------  Cardiac Enzymes No results for input(s): TROPONINI in the last 168 hours. ------------------------------------------------------------  RADIOLOGY:  No results found.     Thank  you for the consultation and for allowing [facility] to assist in the care of your patient. Our recommendations are noted above.  Please contact us if we can be of further service.   Marda Stalker, MD.  Board Certified in Internal Medicine, Pulmonary Medicine, [place], and Sleep Medicine.  [facility] Office Number: [phone]  Patricia Pesa, M.D.  Merton Border, M.D  03/16/2017

## 2017-03-16 NOTE — Patient Instructions (Signed)
Will send for sleep study and Pulmonary function test.   --Quitting smoking is the most important thing that you can do for your health.  --Quitting smoking will have greater affect on your health than any medicine that we can give you.   --The best way to quit is to set a quit date, usually a day that has meaning like someone's birthday.  --Start any medication prescribed for quitting one week before you quit date. Then toss out the cigarettes on your quit date.  --If you start smoking again, start from scratch--set another quit day and try again!   Sleep Apnea       Sleep apnea is disorder that affects a person's sleep. A person with sleep apnea has abnormal pauses in their breathing when they sleep. It is hard for them to get a good sleep. This makes a person tired during the day. It also can lead to other physical problems. There are three types of sleep apnea. One type is when breathing stops for a short time because your airway is blocked (obstructive sleep apnea). Another type is when the brain sometimes fails to give the normal signal to breathe to the muscles that control your breathing (central sleep apnea). The third type is a combination of the other two types. HOME CARE   Take all medicine as told by your doctor.  Avoid alcohol, calming medicines (sedatives), and depressant drugs.  Try to lose weight if you are overweight. Talk to your doctor about a healthy weight goal.  Your doctor may have you use a device that helps to open your airway. It can help you get the air that you need. It is called a positive airway pressure (PAP) device.   MAKE SURE YOU:   Understand these instructions.  Will watch your condition.  Will get help right away if you are not doing well or get worse.  It may take approximately 1 month for you to get used to wearing her CPAP every night.  Be sure to work with your machine to get used to it, be patient, it may take time!

## 2017-03-17 ENCOUNTER — Encounter: Payer: Self-pay | Admitting: *Deleted

## 2017-03-28 NOTE — Progress Notes (Signed)
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  HPI:   The patient was last seen in the hematology clinic on 02/01/2017.  At that time, work-up was reviewed.  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  She felt good.  We discussed small volume phlebotomy to maintain a hematocrit < 47.  She was referred to pulmonary medicine and the low dose chest CT program.  She underwent phlebotomy on 02/08/2017 for a hematocrit of 47.1.    Low dose chest CT on 03/16/2017 was lung-RADS 2, benign appearance or behavior. Continued annual screening with low-dose chest CT without contrast in 12 months was recommended.  There was left main and two-vessel coronary atherosclerosis.  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  There was multinodular goiter. Suspected dominant 4.5 cm inferior left thyroid lobe nodule. Correlation with thyroid ultrasound was warranted. There was a small to moderate hiatal hernia.  She was seen by Dr. Laverle Hobby on 03/16/2017. She was felt to have signs and symptoms of obstructive sleep apnea.  She was scheduled for PFTs and sleep apnea testing. Advair and Spiriva were to be continued. Smoking cessation was discussed.  She was prescribed nicotine patches.  During the interim, she notes intentional weight loss.  She denies any chest pain or increased shortness of breath.  She has bilateral knee pain (6 out of 10).  She has "cut back considerably" on her smoking.  She has sleep apnea testing scheduled in 05/2017.   Past Medical History:  Diagnosis Date  . Colon cancer [facility])     Past Surgical History:  Procedure Laterality Date  . ABDOMINAL HYSTERECTOMY    . APPENDECTOMY      History  reviewed. No pertinent family history.  Social History:  reports that she has been smoking.  She has a 41.00 pack-year smoking history. she has never used smokeless tobacco. She reports that she drinks alcohol. Her drug history is not on file.  She states that she smokes 3 ppd x 20 years.  She now smokes a pack of cigarettes a day.  She drinks 1-2 alcoholic drinks/month.  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  Patient denies known exposures to radiation or toxins. She lives with her mentally challenged daughter, Alicia Holder.  Her other daughter, Alicia Holder, helps out.  Patient does not drive.  She lives in a trailer in [place] that is near her daughter, Alicia Holder.  She has several grandchildren.  She has a GED and geriatric nursing assistant degree.  The patient is alone today.  Allergies: No Known Allergies  Current Medications: Current Outpatient Medications  Medication Sig Dispense Refill  . ADVAIR DISKUS 250-50 MCG/DOSE AEPB Inhale 1 puff into the lungs 2 (two) times daily.    Marland Kitchen docusate sodium (COLACE) 100 MG capsule Take 100 mg by mouth daily.    Marland Kitchen lisinopril (PRINIVIL,ZESTRIL) 10 MG tablet Take 10 mg by mouth daily.    . montelukast (SINGULAIR) 10 MG tablet Take 10 mg by mouth at bedtime.    . nicotine (NICODERM CQ - DOSED IN MG/24 HOURS) 21 mg/24hr patch Place 1 patch (21 mg total) onto the skin daily. 30 patch 1  . oxyCODONE (  OXY IR/ROXICODONE) 5 MG immediate release tablet Take 5 mg by mouth 3 (three) times daily.    . sertraline (ZOLOFT) 50 MG tablet Take 50 mg by mouth daily.    . simvastatin (ZOCOR) 20 MG tablet Take 20 mg by mouth daily.     No current facility-administered medications for this visit.     Review of Systems:  GENERAL:  Feels "fine".  No fevers or sweats.  Intentional weight loss of 9 pounds since last visit. PERFORMANCE STATUS (ECOG):  0 HEENT:  No visual changes, runny nose, sore throat, mouth sores or tenderness. Lungs: COPD. No  shortness of breath at rest while upright.  No hemoptysis.  Denies sleep apnea (notes shortness of breath on back). Cardiac:  No chest pain, palpitations, or PND.  Orthopnea with associated cough.  GI:  Opiod induced constipation.  No nausea, vomiting, diarrhea, melena or hematochezia. GU:  No urgency, frequency, dysuria, or hematuria. Musculoskeletal:  No back pain.  Bilateral knee pain.  No muscle tenderness. Extremities:  No pain or swelling. Skin:  No rashes or skin changes. Neuro:  No headache, numbness or weakness, balance or coordination issues. Endocrine:  No diabetes, thyroid issues, hot flashes or night sweats. Psych:  No mood changes.  Depression on Zoloft.  No anxiety. Pain:  Bilateral knee pain (6 out of 10). Review of systems:  All other systems reviewed and found to be negative.  Physical Exam: Blood pressure 134/89, pulse 64, temperature (!) 97 F (36.1 C), temperature source Tympanic, weight 182 lb 5.1 oz (82.7 kg), SpO2 96 %. GENERAL:  Well developed, well nourished, woman sitting comfortably in the exam room in no acute distress.  She smells of smoke. MENTAL STATUS:  Alert and oriented to person, place and time. HEAD:  Short brown hair.  Normocephalic, atraumatic, face symmetric, no Cushingoid features. EYES:  Blue eyes.  Pupils equal round and reactive to light and accomodation.  No conjunctivitis or scleral icterus. ENT:  Oropharynx clear without lesion.  Tongue normal.  No upper teeth.  Four lower teeth.  Mucous membranes moist.  RESPIRATORY:  Decreased respiratory excursion with decreased breath sounds at the bases.  Clear to auscultation without rales, wheezes or rhonchi. CARDIOVASCULAR:  Regular rate and rhythm without murmur, rub or gallop. ABDOMEN:  Umbilical hernia. Soft, non-tender, with active bowel sounds, and no hepatosplenomegaly.  No masses. SKIN:  Fingertips and nails stained from unfiltered cigarette use. No rashes, ulcers or lesions. EXTREMITIES:  Chronic  lower extremity changes.  No skin discoloration or tenderness.  No palpable cords. LYMPH NODES: No palpable cervical, supraclavicular, axillary or inguinal adenopathy  NEUROLOGICAL: Unremarkable. PSYCH:  Appropriate.   Appointment on 03/29/2017  Component Date Value Ref Range Status  . Ferritin 03/29/2017 34  11 - 307 ng/mL Final   Performed at [facility], [address] [place], [place] [address]  . WBC 03/29/2017 9.9  3.6 - 11.0 K/uL Final  . RBC 03/29/2017 5.58* 3.80 - 5.20 MIL/uL Final  . Hemoglobin 03/29/2017 17.2* 12.0 - 16.0 g/dL Final  . HCT 03/29/2017 51.1* 35.0 - 47.0 % Final  . MCV 03/29/2017 91.6  80.0 - 100.0 fL Final  . MCH 03/29/2017 30.9  26.0 - 34.0 pg Final  . MCHC 03/29/2017 33.8  32.0 - 36.0 g/dL Final  . RDW 03/29/2017 15.3* 11.5 - 14.5 % Final  . Platelets 03/29/2017 212  150 - 440 K/uL Final  . Neutrophils Relative % 03/29/2017 68  % Final  . Neutro Abs  03/29/2017 6.8* 1.4 - 6.5 K/uL Final  . Lymphocytes Relative 03/29/2017 22  % Final  . Lymphs Abs 03/29/2017 2.2  1.0 - 3.6 K/uL Final  . Monocytes Relative 03/29/2017 7  % Final  . Monocytes Absolute 03/29/2017 0.7  0.2 - 0.9 K/uL Final  . Eosinophils Relative 03/29/2017 2  % Final  . Eosinophils Absolute 03/29/2017 0.2  0 - 0.7 K/uL Final  . Basophils Relative 03/29/2017 1  % Final  . 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  She has a 60+ pack year smoking history.  She denies sleep apnea.   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  Last colonoscopy was 10/2012 (next due 2019 per patient).  She has lost 120 pounds in 3 years with portion control and food choices.  Work-up on 01/04/2017 revealed a hematocrit of 50.8, hemoglobin 17, MCV 94.7, platelets 173,000, white count 7400 with an ANC of 5100.  Differential was normal.  Normal labs included CMP, epo level (10.7), ferritin (27), with an iron saturation (6%), JAK2 V617F and exons 12-15 were negative.  BCR-ABL testing was negative for e1a2 (p190), e13a2 (b2a2, p210) and e14a2 (b3a2, p210) fusion transcripts. Carbon monoxide level was 17% (< 9% smokers).    Low dose chest CT on 03/16/2017 was lung-RADS 2, benign appearance or behavior.  There was left main and two-vessel coronary atherosclerosis.  There was a 4.2 cm  ascending thoracic aortic aneurysm with recommendation for annual imaging followup by CTA or MRA.  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 Suspected dominant 4.5 cm inferior left thyroid lobe nodule. Correlation with thyroid ultrasound was warranted. There was a small to moderate hiatal hernia.  She began a phlebotomy program on 02/08/2017.  She undergoes small volume phlebotomy (300 cc) if her hematocrit is > 47.  Symptomatically, she denies any complaints.  Exam is stable.  Hematocrit is 51.1.  Plan: 1.  Labs today:  CBC with diff, ferritin. 2.  Discuss low dose chest CT on 03/16/2017- discuss need for annual low dose chest CT screening.  Discuss f/u of aortic aneurysm.  Discuss ultrasound of multi-nodular goiter. 3.  Discuss pulmonary consult.  Discuss plan for sleep apnea testing.  Encourage smoking cessation. 4.  Phlebotomy today. 5.  Thyroid ultrasound. 6.  RTC every 2 weeks for HCT +/- phlebotomy 7.  RTC in 6 weeks for labs (CBC with diff, ferritin), review of ultrasound, and +/- phlebotomy.   Lequita Asal, MD  03/29/2017, 4:35 PM

## 2017-03-29 ENCOUNTER — Inpatient Hospital Stay: Payer: Medicare HMO

## 2017-03-29 ENCOUNTER — Encounter: Payer: Self-pay | Admitting: Hematology and Oncology

## 2017-03-29 ENCOUNTER — Inpatient Hospital Stay (HOSPITAL_BASED_OUTPATIENT_CLINIC_OR_DEPARTMENT_OTHER): Payer: Medicare HMO | Admitting: Hematology and Oncology

## 2017-03-29 ENCOUNTER — Other Ambulatory Visit: Payer: Self-pay | Admitting: *Deleted

## 2017-03-29 VITALS — BP 134/89 | HR 64 | Temp 97.0°F | Wt 182.3 lb

## 2017-03-29 DIAGNOSIS — Z720 Tobacco use: Secondary | ICD-10-CM

## 2017-03-29 DIAGNOSIS — D751 Secondary polycythemia: Secondary | ICD-10-CM

## 2017-03-29 DIAGNOSIS — E041 Nontoxic single thyroid nodule: Secondary | ICD-10-CM

## 2017-03-29 DIAGNOSIS — I712 Thoracic aortic aneurysm, without rupture, unspecified: Secondary | ICD-10-CM

## 2017-03-29 DIAGNOSIS — Z79899 Other long term (current) drug therapy: Secondary | ICD-10-CM | POA: Diagnosis not present

## 2017-03-29 LAB — CBC WITH DIFFERENTIAL/PLATELET
Basophils Absolute: 0.1 10*3/uL (ref 0–0.1)
Basophils Relative: 1 %
Eosinophils Absolute: 0.2 10*3/uL (ref 0–0.7)
Eosinophils Relative: 2 %
HCT: 51.1 % — ABNORMAL HIGH (ref 35.0–47.0)
Hemoglobin: 17.2 g/dL — ABNORMAL HIGH (ref 12.0–16.0)
Lymphocytes Relative: 22 %
Lymphs Abs: 2.2 10*3/uL (ref 1.0–3.6)
MCH: 30.9 pg (ref 26.0–34.0)
MCHC: 33.8 g/dL (ref 32.0–36.0)
MCV: 91.6 fL (ref 80.0–100.0)
Monocytes Absolute: 0.7 10*3/uL (ref 0.2–0.9)
Monocytes Relative: 7 %
Neutro Abs: 6.8 10*3/uL — ABNORMAL HIGH (ref 1.4–6.5)
Neutrophils Relative %: 68 %
Platelets: 212 10*3/uL (ref 150–440)
RBC: 5.58 MIL/uL — ABNORMAL HIGH (ref 3.80–5.20)
RDW: 15.3 % — ABNORMAL HIGH (ref 11.5–14.5)
WBC: 9.9 10*3/uL (ref 3.6–11.0)

## 2017-03-29 LAB — FERRITIN: Ferritin: 34 ng/mL (ref 11–307)

## 2017-03-29 NOTE — Progress Notes (Signed)
Patient here today got low dose CT scan results.  States she is having sleep study in April.  Only c/o exertional SOB.

## 2017-03-29 NOTE — Patient Instructions (Signed)

## 2017-03-30 DIAGNOSIS — I712 Thoracic aortic aneurysm, without rupture, unspecified: Secondary | ICD-10-CM | POA: Insufficient documentation

## 2017-03-30 DIAGNOSIS — E041 Nontoxic single thyroid nodule: Secondary | ICD-10-CM | POA: Insufficient documentation

## 2017-04-03 ENCOUNTER — Ambulatory Visit
Admission: RE | Admit: 2017-04-03 | Discharge: 2017-04-03 | Disposition: A | Discharge status: Home / Self Care | Payer: Medicare HMO | Source: Ambulatory Visit | Attending: Hematology and Oncology | Admitting: Hematology and Oncology

## 2017-04-03 DIAGNOSIS — E041 Nontoxic single thyroid nodule: Secondary | ICD-10-CM | POA: Diagnosis present

## 2017-04-04 ENCOUNTER — Telehealth: Payer: Self-pay | Admitting: Urgent Care

## 2017-04-04 ENCOUNTER — Other Ambulatory Visit: Payer: Self-pay | Admitting: Urgent Care

## 2017-04-04 DIAGNOSIS — E041 Nontoxic single thyroid nodule: Secondary | ICD-10-CM

## 2017-04-04 NOTE — Telephone Encounter (Signed)
Call placed to patient to review the results of her most recent thyroid ultrasound. Patient made aware that ultrasound significant for multiple nodules, which larger nodules being on the LEFT side. Additionally, patient aware that radiology is recommended a biopsy of one of the nodules to rule out malignancy versus multi-nodular goiter. Patient verbalizes understanding of the results. She wishes to proceed with the the biopsy at this time. Order placed in [facility], and order request sheet sent to centralized scheduling.   We will call patient once the procedure has been scheduled. In the interim, patient is to keep all previously scheduled appointments for labs and phlebotomies.

## 2017-04-05 ENCOUNTER — Other Ambulatory Visit: Payer: Self-pay | Admitting: Hematology and Oncology

## 2017-04-06 ENCOUNTER — Other Ambulatory Visit: Payer: Self-pay | Admitting: Urgent Care

## 2017-04-06 DIAGNOSIS — E041 Nontoxic single thyroid nodule: Secondary | ICD-10-CM

## 2017-04-07 ENCOUNTER — Telehealth: Payer: Self-pay | Admitting: *Deleted

## 2017-04-07 NOTE — Telephone Encounter (Signed)
Scheduling called to report that Patient is scheduled for her Thyroid Bx Tuesday the 12th.

## 2017-04-10 ENCOUNTER — Encounter: Payer: Self-pay | Admitting: Internal Medicine

## 2017-04-10 DIAGNOSIS — G4719 Other hypersomnia: Secondary | ICD-10-CM

## 2017-04-10 DIAGNOSIS — J449 Chronic obstructive pulmonary disease, unspecified: Secondary | ICD-10-CM

## 2017-04-10 DIAGNOSIS — G4733 Obstructive sleep apnea (adult) (pediatric): Secondary | ICD-10-CM

## 2017-04-11 ENCOUNTER — Ambulatory Visit: Admission: RE | Admit: 2017-04-11 | Payer: Medicare HMO | Source: Ambulatory Visit

## 2017-04-12 ENCOUNTER — Inpatient Hospital Stay: Payer: Medicare HMO | Attending: Hematology and Oncology

## 2017-04-12 ENCOUNTER — Inpatient Hospital Stay: Payer: Medicare HMO

## 2017-04-12 VITALS — BP 142/85 | HR 82 | Temp 96.7°F | Resp 20

## 2017-04-12 DIAGNOSIS — D751 Secondary polycythemia: Secondary | ICD-10-CM

## 2017-04-12 LAB — HEMATOCRIT: HCT: 48.5 % — ABNORMAL HIGH (ref 35.0–47.0)

## 2017-04-12 NOTE — Progress Notes (Signed)
Saw patient in infusion center today. Inquired about how her thyroid biopsy went yesterday. Patient seemed confused and advised that she was unaware of the appointment. Attempted to contact centralized scheduling to see if we could reschedule prior to the patient leaving. Shirlean Mylar called and spoke with Pamala Hurry (scheduling). Immediate scheduling request could not be accommodated. Pamala Hurry to return a call to the clinic with a new procedure time. Will plan on calling patient personally when the appointment time has been received. Patient verbalized understanding.  Honor Loh, MSN, APRN, FNP-C, CEN Oncology/Hematology Nurse Practitioner  [facility] 04/12/17, 3:36 PM

## 2017-04-13 ENCOUNTER — Telehealth: Payer: Self-pay | Admitting: *Deleted

## 2017-04-13 DIAGNOSIS — D751 Secondary polycythemia: Secondary | ICD-10-CM | POA: Diagnosis not present

## 2017-04-13 NOTE — Telephone Encounter (Signed)
HST negative. Patient aware.

## 2017-04-20 ENCOUNTER — Ambulatory Visit
Admission: RE | Admit: 2017-04-20 | Discharge: 2017-04-20 | Disposition: A | Discharge status: Home / Self Care | Payer: Medicare HMO | Source: Ambulatory Visit | Attending: Urgent Care | Admitting: Urgent Care

## 2017-04-20 DIAGNOSIS — E042 Nontoxic multinodular goiter: Secondary | ICD-10-CM | POA: Insufficient documentation

## 2017-04-20 DIAGNOSIS — E041 Nontoxic single thyroid nodule: Secondary | ICD-10-CM

## 2017-04-20 NOTE — Procedures (Signed)
US guided FNA of left upper/mid nodule (5 FNAs).  US guided FNA of left lower pole nodule (5 FNAs).  Minimal blood loss and no immediate complication.

## 2017-04-20 NOTE — Discharge Instructions (Signed)
Thyroid Biopsy °The thyroid gland is a butterfly-shaped gland located in the front of the neck. It produces hormones that affect metabolism, growth and development, and body temperature. Thyroid biopsy is a procedure in which small samples of tissue or fluid are removed from the thyroid gland. The samples are then looked at under a microscope to check for abnormalities. This procedure is done to determine the cause of thyroid problems. It may be done to check for infection, cancer, or other thyroid problems. °Two methods may be used for a thyroid biopsy. In one method, a thin needle is inserted through the skin and into the thyroid gland. In the other method, an open incision is made through the skin. °Tell a health care provider about: °· Any allergies you have. °· All medicines you are taking, including vitamins, herbs, eye drops, creams, and over-the-counter medicines. °· Any problems you or family members have had with anesthetic medicines. °· Any blood disorders you have. °· Any surgeries you have had. °· Any medical conditions you have. °What are the risks? °Generally, this is a safe procedure. However, problems can occur and include: °· Bleeding from the procedure site. °· Infection. °· Injury to structures near the thyroid gland. ° °What happens before the procedure? °· Ask your health care provider about: °? Changing or stopping your regular medicines. This is especially important if you are taking diabetes medicines or blood thinners. °? Taking medicines such as aspirin and ibuprofen. These medicines can thin your blood. Do not take these medicines before your procedure if your health care provider asks you not to. °· Do not eat or drink anything after midnight on the night before the procedure or as directed by your health care provider. °· You may have a blood sample taken. °What happens during the procedure? °Either of these methods may be used to perform a thyroid biopsy: °· Fine needle biopsy. You may  be given medicine to help you relax (sedative). You will be asked to lie on your back with your head tipped backward to extend your neck. An area on your neck will be cleaned. A needle will then be inserted through the skin of your neck. You may be asked to avoid coughing, talking, swallowing, or making sounds during some portions of the procedure. The needle will be withdrawn once the tissue or fluid samples have been removed. Pressure may be applied to your neck to reduce swelling and ensure that bleeding has stopped. The samples will be sent to a lab for examination. °· Open biopsy. You will be given medicine to make you sleep (general anesthetic). An incision will be made in your neck. A sample of thyroid tissue will be removed using surgical tools. The tissue sample will be sent for examination. In some cases, the sample may be examined during the biopsy. If that is done and cancer cells are found, some or all of the thyroid gland may be removed. The incision will be closed with stitches. ° °What happens after the procedure? °· Your recovery will be assessed and monitored. °· You may have soreness and tenderness at the site of the biopsy. This should go away after a few days. °· If you had an open biopsy, you may have a hoarse voice or sore throat for a couple days. °· It is your responsibility to get your test results. °This information is not intended to replace advice given to you by your health care provider. Make sure you discuss any questions you have with   your health care provider. °Document Released: 11/14/2006 Document Revised: 09/20/2015 Document Reviewed: 04/11/2013 °Elsevier Interactive Patient Education © 2018 [employer] ° °

## 2017-04-24 LAB — CYTOLOGY - NON PAP

## 2017-04-26 ENCOUNTER — Other Ambulatory Visit: Payer: Medicare HMO

## 2017-05-01 ENCOUNTER — Inpatient Hospital Stay: Payer: Medicare HMO

## 2017-05-01 ENCOUNTER — Inpatient Hospital Stay: Payer: Medicare HMO | Attending: Hematology and Oncology

## 2017-05-01 DIAGNOSIS — E041 Nontoxic single thyroid nodule: Secondary | ICD-10-CM | POA: Insufficient documentation

## 2017-05-01 DIAGNOSIS — Z720 Tobacco use: Secondary | ICD-10-CM | POA: Insufficient documentation

## 2017-05-01 DIAGNOSIS — Z85038 Personal history of other malignant neoplasm of large intestine: Secondary | ICD-10-CM | POA: Insufficient documentation

## 2017-05-01 DIAGNOSIS — D751 Secondary polycythemia: Secondary | ICD-10-CM | POA: Diagnosis not present

## 2017-05-01 LAB — HEMATOCRIT: HCT: 47.2 % — ABNORMAL HIGH (ref 35.0–47.0)

## 2017-05-04 ENCOUNTER — Telehealth: Payer: Self-pay | Admitting: Internal Medicine

## 2017-05-04 NOTE — Telephone Encounter (Signed)
Pt calling needing to reschedule PFT that is scheduled for next week She states she doesn't have a ride to come next week  Please call back

## 2017-05-04 NOTE — Telephone Encounter (Signed)
Called patient and gave her scheduling contact # [phone] in which she is to call to cancel PFT and re-schedule.

## 2017-05-08 ENCOUNTER — Other Ambulatory Visit: Payer: Self-pay | Admitting: *Deleted

## 2017-05-08 DIAGNOSIS — D751 Secondary polycythemia: Secondary | ICD-10-CM

## 2017-05-09 ENCOUNTER — Ambulatory Visit: Payer: Medicare HMO | Attending: Internal Medicine

## 2017-05-10 ENCOUNTER — Inpatient Hospital Stay: Payer: Medicare HMO

## 2017-05-10 ENCOUNTER — Inpatient Hospital Stay: Payer: Medicare HMO | Admitting: Hematology and Oncology

## 2017-05-10 DIAGNOSIS — Z7189 Other specified counseling: Secondary | ICD-10-CM | POA: Insufficient documentation

## 2017-05-10 NOTE — Progress Notes (Deleted)
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  At that time, she denied any complaints.  Exam was stable.  Hematocrit was 51.1. She underwent phlebotomy.  Low dose chest CT revealed an aortic aneurysm and multi-nodular goiter.  We discussed pulmonary consult for sleep apnea testing.  Smoking cessation was encouraged.  Thyroid ultrasound on 04/03/2017 revealed 7 nodules.  Left mid thyroid nodule (labeled 4) met criteria for biopsy.  The left inferior thyroid nodule (labeled 7) would meet criteria for biopsy, however, was favored to be a pseudo nodule and extension of thyroid goiter into the mediastinum given the appearance on prior CT. Further surveillance or biopsy may be considered.  Left thyroid nodules (labeled 5 and 6) both met criteria for surveillance. Surveillance ultrasound study was recommended to be performed annually up to 5 years.  None of the right-sided nodules met criteria for surveillance or biopsy.  She underwent FNA thyroid biopsy x 2 on 04/20/2017.  Left mid thyroid nodule and left inferior thyroid nodule FNA were benign (Bethesda category II) and c/w benign follicular nodule.    She has undergone phlebotomy (last 04/12/2017).  Hematocrit was 48.5 on 04/12/2017 and 47.2 on 05/01/2017.  During the interim,   Past Medical History:  Diagnosis Date  . Colon cancer [facility])     Past Surgical History:  Procedure Laterality Date  . ABDOMINAL HYSTERECTOMY    . APPENDECTOMY      No family history on file.  Social History:  reports that she has been smoking.  She has a 41.00 pack-year smoking history. She has never used smokeless tobacco. She reports that she drinks alcohol. Her drug history is not on file.   She states that she smokes 3 ppd x 20 years.  She now smokes a pack of cigarettes a day.  She drinks 1-2 alcoholic drinks/month.  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  Patient denies known exposures to radiation or toxins. She lives with her mentally challenged daughter, [name].  Her other daughter, [name], helps out.  Patient does not drive.  She lives in a trailer in [place] that is near her daughter, [name].  She has several grandchildren.  She has a GED and geriatric nursing assistant degree.  The patient is alone today.  Allergies: No Known Allergies  Current Medications: Current Outpatient Medications  Medication Sig Dispense Refill  . ADVAIR DISKUS 250-50 MCG/DOSE AEPB Inhale 1 puff into the lungs 2 (two) times daily.    Marland Kitchen docusate sodium (COLACE) 100 MG capsule Take 100 mg by mouth daily.    Marland Kitchen lisinopril (PRINIVIL,ZESTRIL) 10 MG tablet Take 10 mg by mouth daily.    . montelukast (SINGULAIR) 10 MG tablet Take 10 mg by mouth at bedtime.    . nicotine (NICODERM CQ - DOSED IN MG/24 HOURS) 21 mg/24hr patch Place 1 patch (21 mg total) onto the skin daily. (Patient not taking: Reported on 04/20/2017) 30 patch 1  . oxyCODONE (OXY IR/ROXICODONE) 5 MG immediate release tablet Take 5 mg by mouth 3 (three) times daily.    . sertraline (ZOLOFT) 50 MG tablet Take 50 mg by mouth daily.    . simvastatin (ZOCOR) 20 MG  tablet Take 20 mg by mouth daily.     No current facility-administered medications for this visit.     Review of Systems:  GENERAL:  Feels "fine".  No fevers or sweats.  Intentional weight loss of 9 pounds since last visit. PERFORMANCE STATUS (ECOG):  0 HEENT:  No visual changes, runny nose, sore throat, mouth sores or tenderness. Lungs: COPD. No shortness of breath at rest while upright.  No hemoptysis.  Denies sleep apnea (notes shortness of breath on back). Cardiac:  No chest pain, palpitations, or PND.  Orthopnea with associated cough.  GI:  Opiod  induced constipation.  No nausea, vomiting, diarrhea, melena or hematochezia. GU:  No urgency, frequency, dysuria, or hematuria. Musculoskeletal:  No back pain.  Bilateral knee pain.  No muscle tenderness. Extremities:  No pain or swelling. Skin:  No rashes or skin changes. Neuro:  No headache, numbness or weakness, balance or coordination issues. Endocrine:  No diabetes, thyroid issues, hot flashes or night sweats. Psych:  No mood changes.  Depression on Zoloft.  No anxiety. Pain:  Bilateral knee pain (6 out of 10). Review of systems:  All other systems reviewed and found to be negative.  Physical Exam: There were no vitals taken for this visit. GENERAL:  Well developed, well nourished, woman sitting comfortably in the exam room in no acute distress.  She smells of smoke. MENTAL STATUS:  Alert and oriented to person, place and time. HEAD:  Short brown hair.  Normocephalic, atraumatic, face symmetric, no Cushingoid features. EYES:  Blue eyes.  Pupils equal round and reactive to light and accomodation.  No conjunctivitis or scleral icterus. ENT:  Oropharynx clear without lesion.  Tongue normal.  No upper teeth.  Four lower teeth.  Mucous membranes moist.  RESPIRATORY:  Decreased respiratory excursion with decreased breath sounds at the bases.  Clear to auscultation without rales, wheezes or rhonchi. CARDIOVASCULAR:  Regular rate and rhythm without murmur, rub or gallop. ABDOMEN:  Umbilical hernia. Soft, non-tender, with active bowel sounds, and no hepatosplenomegaly.  No masses. SKIN:  Fingertips and nails stained from unfiltered cigarette use. No rashes, ulcers or lesions. EXTREMITIES:  Chronic lower extremity changes.  No skin discoloration or tenderness.  No palpable cords. LYMPH NODES: No palpable cervical, supraclavicular, axillary or inguinal adenopathy  NEUROLOGICAL: Unremarkable. PSYCH:  Appropriate.   No visits with results within 3 Day(s) from this visit.  Latest known visit  with results is:  Appointment on 05/01/2017  Component Date Value Ref Range Status  . 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  She has a 60+ pack year smoking history.  She denies sleep apnea.   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  Last colonoscopy was 10/2012 (next due 2019 per patient).  She has lost 120 pounds in 3 years with portion control and food choices.  Work-up on 01/04/2017 revealed a hematocrit of 50.8, hemoglobin 17, MCV 94.7, platelets 173,000, white count 7400 with an ANC of 5100. Differential was normal.  Normal labs included CMP, epo level (10.7), ferritin (27), with an iron saturation (6%), JAK2 V617F and exons 12-15 were negative.  BCR-ABL testing was negative for e1a2 (p190), e13a2 (b2a2, p210) and e14a2 (b3a2, p210) fusion transcripts. Carbon monoxide level was 17% (< 9% smokers).    Low dose  chest CT on 03/16/2017 was lung-RADS 2, benign appearance or behavior.  There was left main and two-vessel coronary atherosclerosis.  There was a 4.2 cm  ascending thoracic aortic aneurysm with recommendation for annual imaging followup by CTA or MRA.  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 Suspected dominant 4.5 cm inferior left thyroid lobe nodule. Correlation with thyroid ultrasound was warranted. There was a small to moderate hiatal hernia.  She began a phlebotomy program on 02/08/2017.  She undergoes small volume phlebotomy (300 cc) if her hematocrit is > 47.  Symptomatically, she denies any complaints.  Exam is stable.  Hematocrit is 51.1.  Plan: 1.  Labs today:  CBC with diff, ferritin. 2.  Review interval thyroid ultrasound and biopsy.   2.  Discuss low dose chest CT on 03/16/2017- discuss need for annual low dose chest CT screening.  Discuss f/u  of aortic aneurysm.  Discuss ultrasound of multi-nodular goiter. 3.  Discuss pulmonary consult.  Discuss plan for sleep apnea testing.  Encourage smoking cessation. 4.  Phlebotomy today. 5.  Thyroid ultrasound. 6.  RTC every 2 weeks for HCT +/- phlebotomy 7.  RTC in 6 weeks for labs (CBC with diff, ferritin), review of ultrasound, and +/- phlebotomy.   Lequita Asal, MD  05/10/2017, 6:12 AM   I saw and evaluated the patient, participating in the key portions of the service and reviewing pertinent diagnostic studies and records.  I reviewed the nurse practitioner's note and agree with the findings and the plan.  The assessment and plan were discussed with the patient.  Additional diagnostic studies of *** are needed to clarify *** and would change the clinical management.  A few ***multiple questions were asked by the patient and answered.   Nolon Stalls, MD 05/10/2017,6:12 AM

## 2017-05-15 ENCOUNTER — Ambulatory Visit: Payer: Medicare HMO | Admitting: Internal Medicine

## 2017-05-17 ENCOUNTER — Inpatient Hospital Stay (HOSPITAL_BASED_OUTPATIENT_CLINIC_OR_DEPARTMENT_OTHER): Payer: Medicare HMO | Admitting: Hematology and Oncology

## 2017-05-17 ENCOUNTER — Inpatient Hospital Stay: Payer: Medicare HMO

## 2017-05-17 ENCOUNTER — Encounter: Payer: Self-pay | Admitting: Hematology and Oncology

## 2017-05-17 VITALS — BP 118/78 | HR 91 | Temp 95.9°F | Resp 18 | Wt 182.3 lb

## 2017-05-17 DIAGNOSIS — Z720 Tobacco use: Secondary | ICD-10-CM | POA: Diagnosis not present

## 2017-05-17 DIAGNOSIS — D751 Secondary polycythemia: Secondary | ICD-10-CM

## 2017-05-17 DIAGNOSIS — E041 Nontoxic single thyroid nodule: Secondary | ICD-10-CM | POA: Diagnosis not present

## 2017-05-17 LAB — CBC WITH DIFFERENTIAL/PLATELET
Basophils Absolute: 0.1 10*3/uL (ref 0–0.1)
Basophils Relative: 1 %
Eosinophils Absolute: 0.2 10*3/uL (ref 0–0.7)
Eosinophils Relative: 3 %
HCT: 46.4 % (ref 35.0–47.0)
Hemoglobin: 15.7 g/dL (ref 12.0–16.0)
Lymphocytes Relative: 21 %
Lymphs Abs: 2.1 10*3/uL (ref 1.0–3.6)
MCH: 29.8 pg (ref 26.0–34.0)
MCHC: 33.7 g/dL (ref 32.0–36.0)
MCV: 88.5 fL (ref 80.0–100.0)
Monocytes Absolute: 0.8 10*3/uL (ref 0.2–0.9)
Monocytes Relative: 8 %
Neutro Abs: 7 10*3/uL — ABNORMAL HIGH (ref 1.4–6.5)
Neutrophils Relative %: 69 %
Platelets: 166 10*3/uL (ref 150–440)
RBC: 5.25 MIL/uL — ABNORMAL HIGH (ref 3.80–5.20)
RDW: 15.8 % — ABNORMAL HIGH (ref 11.5–14.5)
WBC: 10.1 10*3/uL (ref 3.6–11.0)

## 2017-05-17 LAB — FERRITIN: Ferritin: 17 ng/mL (ref 11–307)

## 2017-05-17 NOTE — Progress Notes (Signed)
[facility] day:  05/17/2017     Chief Complaint: Alicia Holder is a 67 y.o. 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  HPI:   The patient was last seen in the hematology clinic on 03/29/2017.  At that time, she denied any complaints.  Exam was stable.  Hematocrit was 51.1. She underwent phlebotomy.  Low dose chest CT revealed an aortic aneurysm and multi-nodular goiter.  We discussed pulmonary consult for sleep apnea testing.  Smoking cessation was encouraged.  Thyroid ultrasound on 04/03/2017 revealed 7 nodules.  Left mid thyroid nodule (labeled 4) met criteria for biopsy.  The left inferior thyroid nodule (labeled 7) would meet criteria for biopsy, however, was favored to be a pseudo nodule and extension of thyroid goiter into the mediastinum given the appearance on prior CT. Further surveillance or biopsy may be considered.  Left thyroid nodules (labeled 5 and 6) both met criteria for surveillance. Surveillance ultrasound study was recommended to be performed annually up to 5 years.  None of the right-sided nodules met criteria for surveillance or biopsy.  She underwent FNA thyroid biopsy x 2 on 04/20/2017.  Left mid thyroid nodule and left inferior thyroid nodule FNA were benign (Bethesda category II) and c/w benign follicular nodule.    She has undergone phlebotomy (last 04/12/2017).  Hematocrit was 48.5 on 04/12/2017 and 47.2 on 05/01/2017.  During the interim, patient is doing well. She notes that she is smoking "less". Patient currently smoking 1 pack of cigarettes daily. She is using distraction techniques to help her cut back on her smoking. She states, "When I am up doing something, and I was a cigarette, I put in a peppermint candy".  Patient has had recent sleep apnea testing that was negative for OSAH syndrome. Patient notes some minimal fatigue following her  phlebotomies. She denies episodes of chest pain and shortness of breath.   Patient denies B symptoms. She has not had any recent infections. Patient is eating well. Her weight remains stable. Patient is trying to be more active by doing "cat exercises".   She complains of chronic pain in her knees that she rates 5/10 today.    Past Medical History:  Diagnosis Date  . Colon cancer [facility])     Past Surgical History:  Procedure Laterality Date  . ABDOMINAL HYSTERECTOMY    . APPENDECTOMY      History reviewed. No pertinent family history.  Social History:  reports that she has been smoking.  She has a 41.00 pack-year smoking history. She has never used smokeless tobacco. She reports that she drinks alcohol. Her drug history is not on file.  She states that she smokes 3 ppd x 20 years.  She now smokes a pack of cigarettes a day.  She drinks 1-2 alcoholic drinks/month.  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  Patient denies known exposures to radiation or toxins. She lives with her mentally challenged daughter, [name].  Her other daughter, [name], helps out.  Patient does not drive.  She lives in a trailer in [place] that is near her daughter, [name].  She has several grandchildren.  She has a GED and geriatric nursing assistant degree.  The patient is alone today.  Allergies: No Known Allergies  Current Medications: Current Outpatient Medications  Medication Sig Dispense Refill  . ADVAIR DISKUS 250-50 MCG/DOSE AEPB Inhale  1 puff into the lungs 2 (two) times daily.    Marland Kitchen docusate sodium (COLACE) 100 MG capsule Take 100 mg by mouth daily.    Marland Kitchen lisinopril (PRINIVIL,ZESTRIL) 10 MG tablet Take 10 mg by mouth daily.    . montelukast (SINGULAIR) 10 MG tablet Take 10 mg by mouth at bedtime.    Marland Kitchen oxyCODONE (OXY IR/ROXICODONE) 5 MG immediate release tablet Take 5 mg by mouth 3 (three) times daily.    . sertraline (ZOLOFT) 50 MG tablet Take 50 mg by mouth daily.    .  simvastatin (ZOCOR) 20 MG tablet Take 20 mg by mouth daily.    . nicotine (NICODERM CQ - DOSED IN MG/24 HOURS) 21 mg/24hr patch Place 1 patch (21 mg total) onto the skin daily. (Patient not taking: Reported on 04/20/2017) 30 patch 1   No current facility-administered medications for this visit.     Review of Systems:  GENERAL:  Feels good. No fevers, sweats or weight loss.  Weight stable. PERFORMANCE STATUS (ECOG):  1 HEENT:  No visual changes, runny nose, sore throat, mouth sores or tenderness. Lungs: COPD.  No shortness of breath or cough.  No hemoptysis. Negative sleep apnea testing. Cardiac:  No chest pain, palpitations, orthopnea, or PND. GI:  No nausea, vomiting, diarrhea, constipation, melena or hematochezia. GU:  No urgency, frequency, dysuria, or hematuria. Musculoskeletal:  No back pain.  Knee pain.  No muscle tenderness. Extremities:  No pain or swelling. Skin:  No rashes or skin changes. Neuro:  No headache, numbness or weakness, balance or coordination issues. Endocrine:  No diabetes, thyroid issues, hot flashes or night sweats. Psych:  No mood changes, depression or anxiety. Pain:  Knee pain (5 out of 10). Review of systems:  All other systems reviewed and found to be negative.   Physical Exam: Blood pressure 118/78, pulse 91, temperature (!) 95.9 F (35.5 C), temperature source Tympanic, resp. rate 18, weight 182 lb 5.1 oz (82.7 kg), SpO2 93 %. GENERAL:  Well developed, well nourished, woman sitting comfortably in the exam room in no acute distress. MENTAL STATUS:  Alert and oriented to person, place and time. HEAD:  Ow[place]ark styled hair.  Normocephalic, atraumatic, face symmetric, no Cushingoid features. EYES:  Blue eyes.  Pupils equal round and reactive to light and accomodation.  No conjunctivitis or scleral icterus. ENT:  Oropharynx clear without lesion.  Tongue normal. Mucous membranes moist.  RESPIRATORY:  Clear to auscultation without rales, wheezes or  rhonchi. CARDIOVASCULAR:  Regular rate and rhythm without murmur, rub or gallop. ABDOMEN:  Soft, non-tender, with active bowel sounds, and no hepatosplenomegaly.  No masses. SKIN:  Fingertips and nails stained from cigarettes.  No rashes, ulcers or lesions. EXTREMITIES:  Chronic lower extremity changes.  No skin discoloration or tenderness.  No palpable cords. LYMPH NODES: No palpable cervical, supraclavicular, axillary or inguinal adenopathy  NEUROLOGICAL: Unremarkable. PSYCH:  Appropriate.    Appointment on 05/17/2017  Component Date Value Ref Range Status  . WBC 05/17/2017 10.1  3.6 - 11.0 K/uL Final  . RBC 05/17/2017 5.25* 3.80 - 5.20 MIL/uL Final  . Hemoglobin 05/17/2017 15.7  12.0 - 16.0 g/dL Final  . HCT 05/17/2017 46.4  35.0 - 47.0 % Final  . MCV 05/17/2017 88.5  80.0 - 100.0 fL Final  . MCH 05/17/2017 29.8  26.0 - 34.0 pg Final  . MCHC 05/17/2017 33.7  32.0 - 36.0 g/dL Final  . RDW 05/17/2017 15.8* 11.5 - 14.5 % Final  . Platelets 05/17/2017 166  150 - 440 K/uL Final  . Neutrophils Relative % 05/17/2017 69  % Final  . Neutro Abs 05/17/2017 7.0* 1.4 - 6.5 K/uL Final  . Lymphocytes Relative 05/17/2017 21  % Final  . Lymphs Abs 05/17/2017 2.1  1.0 - 3.6 K/uL Final  . Monocytes Relative 05/17/2017 8  % Final  . Monocytes Absolute 05/17/2017 0.8  0.2 - 0.9 K/uL Final  . Eosinophils Relative 05/17/2017 3  % Final  . Eosinophils Absolute 05/17/2017 0.2  0 - 0.7 K/uL Final  . Basophils Relative 05/17/2017 1  % Final  . 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  She has a 60+ pack year smoking history.  She denies sleep apnea.   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  Last colonoscopy was 10/2012 (next due 2019 per patient).  She has lost 120 pounds in 3 years with portion control and  food choices.  Work-up on 01/04/2017 revealed a hematocrit of 50.8, hemoglobin 17, MCV 94.7, platelets 173,000, white count 7400 with an ANC of 5100. Differential was normal.  Normal labs included CMP, epo level (10.7), ferritin (27), with an iron saturation (6%), JAK2 V617F and exons 12-15 were negative.  BCR-ABL testing was negative for e1a2 (p190), e13a2 (b2a2, p210) and e14a2 (b3a2, p210) fusion transcripts. Carbon monoxide level was 17% (< 9% smokers).    Low dose chest CT on 03/16/2017 was lung-RADS 2, benign appearance or behavior.  There was left main and two-vessel coronary atherosclerosis.  There was a 4.2 cm  ascending thoracic aortic aneurysm with recommendation for annual imaging followup by CTA or MRA.  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 Suspected dominant 4.5 cm inferior left thyroid lobe nodule. Correlation with thyroid ultrasound was warranted. There was a small to moderate hiatal hernia.  Thyroid ultrasound on 04/03/2017 revealed 7 nodules.  Left mid thyroid nodule (labeled 4) met criteria for biopsy.  The left inferior thyroid nodule (labeled 7) would meet criteria for biopsy, however, was favored to be a pseudo nodule and extension of thyroid goiter into the mediastinum given the appearance on prior CT.  Left thyroid nodules (labeled 5 and 6) both met criteria for surveillance. None of the right-sided nodules met criteria for surveillance or biopsy.  FNA thyroid biopsy x 2 on 04/20/2017 of the left mid thyroid nodule and left inferior thyroid nodule were benign (Bethesda category II) and c/w benign follicular nodule.    She began a phlebotomy program on 02/08/2017 (last 04/12/2017).  She undergoes small volume phlebotomy (300 cc) if her hematocrit is > 47.  Symptomatically, she is doing well.  She has chronic pain in her knees. Patient trying to be more active by doing "cat exercises".  Exam is stable.  Hematocrit is 46.4.  Plan: 1.   Labs today:  CBC with diff, ferritin. 2.  Review interval thyroid ultrasound and biopsy.  FNA x 2 was benign.  Continue surveillance. 3.  Discuss erythrocytosis. Hematocrit stable at 46.4. No small volume phlebotomy done. Discussed continued reduction in the amount that she is smoking to hopefully reduce her need for phlebotomies.  4.  RTC every 2 weeks for HCT +/- phlebotomy 5.  RTC in 4 months for labs (CBC with diff, ferritin), review of  ultrasound, and +/- phlebotomy.   Honor Loh, NP  05/17/2017, 3:46 PM   I saw and evaluated the patient, participating in the key portions of the service and reviewing pertinent diagnostic studies and records.  I reviewed the nurse practitioner's note and agree with the findings and the plan.  The assessment and plan were discussed with the patient.  Several questions were asked by the patient and answered.   Nolon Stalls, MD 05/17/2017,3:46 PM

## 2017-05-17 NOTE — Progress Notes (Signed)
Patient offers no complaints today.  Here today for [place] results.

## 2017-05-31 ENCOUNTER — Inpatient Hospital Stay: Payer: Medicare HMO

## 2017-05-31 ENCOUNTER — Inpatient Hospital Stay: Payer: Medicare HMO | Attending: Hematology and Oncology

## 2017-05-31 DIAGNOSIS — D751 Secondary polycythemia: Secondary | ICD-10-CM

## 2017-05-31 LAB — HEMATOCRIT: HCT: 46.5 % (ref 35.0–47.0)

## 2017-06-04 ENCOUNTER — Encounter: Payer: Self-pay | Admitting: Hematology and Oncology

## 2017-06-14 ENCOUNTER — Inpatient Hospital Stay: Payer: Medicare HMO

## 2017-06-14 DIAGNOSIS — D751 Secondary polycythemia: Secondary | ICD-10-CM | POA: Diagnosis not present

## 2017-06-14 LAB — HEMATOCRIT: HCT: 45.3 % (ref 35.0–47.0)

## 2017-06-28 ENCOUNTER — Inpatient Hospital Stay: Payer: Medicare HMO

## 2017-06-29 ENCOUNTER — Inpatient Hospital Stay: Payer: Medicare HMO

## 2017-07-12 ENCOUNTER — Inpatient Hospital Stay: Payer: Medicare HMO | Attending: Hematology and Oncology

## 2017-07-12 ENCOUNTER — Inpatient Hospital Stay: Payer: Medicare HMO

## 2017-07-12 VITALS — BP 132/86 | HR 80 | Temp 96.5°F | Resp 18

## 2017-07-12 DIAGNOSIS — D751 Secondary polycythemia: Secondary | ICD-10-CM

## 2017-07-12 LAB — HEMATOCRIT: HCT: 47.9 % — ABNORMAL HIGH (ref 35.0–47.0)

## 2017-07-26 ENCOUNTER — Inpatient Hospital Stay: Payer: Medicare HMO

## 2017-08-09 ENCOUNTER — Inpatient Hospital Stay: Payer: Medicare HMO

## 2017-08-23 ENCOUNTER — Inpatient Hospital Stay: Payer: Medicare HMO | Attending: Hematology and Oncology

## 2017-08-23 ENCOUNTER — Inpatient Hospital Stay: Payer: Medicare HMO

## 2017-08-23 ENCOUNTER — Encounter: Payer: Self-pay | Admitting: Hematology and Oncology

## 2017-08-23 VITALS — BP 118/82 | HR 88 | Temp 97.4°F | Resp 18

## 2017-08-23 DIAGNOSIS — D751 Secondary polycythemia: Secondary | ICD-10-CM | POA: Diagnosis present

## 2017-08-23 LAB — HEMATOCRIT: HCT: 49.9 % — ABNORMAL HIGH (ref 35.0–47.0)

## 2017-08-23 NOTE — Patient Instructions (Signed)

## 2017-09-06 ENCOUNTER — Inpatient Hospital Stay: Payer: Medicare HMO | Attending: Hematology and Oncology

## 2017-09-06 ENCOUNTER — Inpatient Hospital Stay: Payer: Medicare HMO

## 2017-09-06 DIAGNOSIS — D751 Secondary polycythemia: Secondary | ICD-10-CM | POA: Insufficient documentation

## 2017-09-06 LAB — HEMATOCRIT: HCT: 45.4 % (ref 35.0–47.0)

## 2017-09-20 ENCOUNTER — Inpatient Hospital Stay: Payer: Medicare HMO | Admitting: Hematology and Oncology

## 2017-09-20 ENCOUNTER — Inpatient Hospital Stay: Payer: Medicare HMO

## 2017-09-20 NOTE — Progress Notes (Deleted)
[facility] day:  09/20/2017     Chief Complaint: Alicia Holder is a 67 y.o. 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  HPI:   The patient was last seen in the hematology clinic on 05/17/2017.  At that time, she was doing well.  She had chronic pain in her knees. She was trying to be more active by doing "cat exercises".  Exam was stable.  Hematocrit was 46.4.  No phlebotomy was needed.  We discussed smoking cessation.  She has had monthly hematocrit checks.   Hematocrit was 47.9 on 07/12/2017 and 49.9 on 08/23/2017.  She underwent phlebotomy x 2.  During the interim,   Past Medical History:  Diagnosis Date  . Colon cancer [facility])     Past Surgical History:  Procedure Laterality Date  . ABDOMINAL HYSTERECTOMY    . APPENDECTOMY      No family history on file.  Social History:  reports that she has been smoking. She has a 41.00 pack-year smoking history. She has never used smokeless tobacco. She reports that she drinks alcohol. Her drug history is not on file.  She states that she smokes 3 ppd x 20 years.  She now smokes a pack of cigarettes a day.  She drinks 1-2 alcoholic drinks/month.  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  Patient denies known exposures to radiation or toxins. She lives with her mentally challenged daughter, [name].  Her other daughter, [name], helps out.  Patient does not drive.  She lives in a trailer in [place] that is near her daughter, [name].  She has several grandchildren.  She has a GED and geriatric nursing assistant degree.  The patient is alone today.  Allergies: No Known Allergies  Current Medications: Current Outpatient Medications  Medication Sig Dispense Refill  . ADVAIR DISKUS 250-50 MCG/DOSE AEPB Inhale 1 puff into the lungs 2 (two) times daily.    Marland Kitchen docusate sodium (COLACE) 100 MG capsule Take 100 mg by mouth daily.    Marland Kitchen lisinopril  (PRINIVIL,ZESTRIL) 10 MG tablet Take 10 mg by mouth daily.    . montelukast (SINGULAIR) 10 MG tablet Take 10 mg by mouth at bedtime.    . nicotine (NICODERM CQ - DOSED IN MG/24 HOURS) 21 mg/24hr patch Place 1 patch (21 mg total) onto the skin daily. (Patient not taking: Reported on 04/20/2017) 30 patch 1  . oxyCODONE (OXY IR/ROXICODONE) 5 MG immediate release tablet Take 5 mg by mouth 3 (three) times daily.    . sertraline (ZOLOFT) 50 MG tablet Take 50 mg by mouth daily.    . simvastatin (ZOCOR) 20 MG tablet Take 20 mg by mouth daily.     No current facility-administered medications for this visit.     Review of Systems:  GENERAL:  Feels good. No fevers, sweats or weight loss.  Weight stable. PERFORMANCE STATUS (ECOG):  1 HEENT:  No visual changes, runny nose, sore throat, mouth sores or tenderness. Lungs: COPD.  No shortness of breath or cough.  No hemoptysis. Negative sleep apnea testing. Cardiac:  No chest pain, palpitations, orthopnea, or PND. GI:  No nausea, vomiting, diarrhea, constipation, melena or hematochezia. GU:  No urgency, frequency, dysuria, or hematuria. Musculoskeletal:  No back pain.  Knee pain.  No muscle tenderness. Extremities:  No pain or swelling. Skin:  No rashes or skin changes. Neuro:  No headache, numbness or weakness, balance or  coordination issues. Endocrine:  No diabetes, thyroid issues, hot flashes or night sweats. Psych:  No mood changes, depression or anxiety. Pain:  Knee pain (5 out of 10). Review of systems:  All other systems reviewed and found to be negative.   Physical Exam: There were no vitals taken for this visit. GENERAL:  Well developed, well nourished, woman sitting comfortably in the exam room in no acute distress. MENTAL STATUS:  Alert and oriented to person, place and time. HEAD:  Ow[place]ark styled hair.  Normocephalic, atraumatic, face symmetric, no Cushingoid features. EYES:  Blue eyes.  Pupils equal round and reactive to light and  accomodation.  No conjunctivitis or scleral icterus. ENT:  Oropharynx clear without lesion.  Tongue normal. Mucous membranes moist.  RESPIRATORY:  Clear to auscultation without rales, wheezes or rhonchi. CARDIOVASCULAR:  Regular rate and rhythm without murmur, rub or gallop. ABDOMEN:  Soft, non-tender, with active bowel sounds, and no hepatosplenomegaly.  No masses. SKIN:  Fingertips and nails stained from cigarettes.  No rashes, ulcers or lesions. EXTREMITIES:  Chronic lower extremity changes.  No skin discoloration or tenderness.  No palpable cords. LYMPH NODES: No palpable cervical, supraclavicular, axillary or inguinal adenopathy  NEUROLOGICAL: Unremarkable. PSYCH:  Appropriate.    No visits with results within 3 Day(s) from this visit.  Latest known visit with results is:  Appointment on 09/06/2017  Component Date Value Ref Range Status  . 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  She has a 60+ pack year smoking history.  She denies sleep apnea.   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  Last colonoscopy was 10/2012 (next due 2019 per patient).  She has lost 120 pounds in 3 years with portion control and food choices.  Work-up on 01/04/2017 revealed a hematocrit of 50.8, hemoglobin 17, MCV 94.7, platelets 173,000, white count 7400 with an ANC of 5100. Differential was normal.  Normal labs included CMP, epo level (10.7), ferritin (27), with an iron saturation (6%), JAK2 V617F and exons 12-15 were negative.  BCR-ABL testing was negative for e1a2 (p190), e13a2 (b2a2, p210) and e14a2 (b3a2, p210) fusion transcripts. Carbon monoxide level was 17% (< 9% smokers).    Low dose chest CT on 03/16/2017 was lung-RADS 2, benign appearance or behavior.  There was left main and two-vessel coronary atherosclerosis.   There was a 4.2 cm  ascending thoracic aortic aneurysm with recommendation for annual imaging followup by CTA or MRA.  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 Suspected dominant 4.5 cm inferior left thyroid lobe nodule. Correlation with thyroid ultrasound was warranted. There was a small to moderate hiatal hernia.  Thyroid ultrasound on 04/03/2017 revealed 7 nodules.  Left mid thyroid nodule (labeled 4) met criteria for biopsy.  The left inferior thyroid nodule (labeled 7) would meet criteria for biopsy, however, was favored to be a pseudo nodule and extension of thyroid goiter into the mediastinum given the appearance on prior CT.  Left thyroid nodules (labeled 5 and 6) both met criteria for surveillance. None of the right-sided nodules met criteria for surveillance or biopsy.  FNA thyroid biopsy x 2 on 04/20/2017 of the left mid thyroid nodule and left inferior thyroid nodule were benign (Bethesda category II) and c/w benign follicular nodule.  She began a phlebotomy program on 02/08/2017 (last 08/23/2017).  She undergoes small volume phlebotomy (300 cc) if her hematocrit is > 47.  Symptomatically,   Plan: 1.  Labs today:  CBC with diff, ferritin.   2.  Review interval thyroid ultrasound and biopsy.  FNA x 2 was benign.  Continue surveillance (next 04/04/2018). 3.  Discuss erythrocytosis. Hematocrit stable at 46.4. No small volume phlebotomy done. Discussed continued reduction in the amount that she is smoking to hopefully reduce her need for phlebotomies.  4.  RTC every 2 weeks for HCT +/- phlebotomy 5.  RTC in 4 months for labs (CBC with diff, ferritin), review of ultrasound, and +/- phlebotomy.   Lequita Asal, MD  09/20/2017, 10:10 AM   I saw and evaluated the patient, participating in the key portions of the service and reviewing pertinent diagnostic studies and records.  I reviewed the nurse practitioner's note and agree with the findings  and the plan.  The assessment and plan were discussed with the patient.  Several questions were asked by the patient and answered.   Nolon Stalls, MD 09/20/2017,10:10 AM

## 2017-09-27 ENCOUNTER — Other Ambulatory Visit: Payer: Medicare HMO

## 2017-09-27 ENCOUNTER — Ambulatory Visit: Payer: Medicare HMO | Admitting: Hematology and Oncology

## 2017-10-04 ENCOUNTER — Inpatient Hospital Stay: Payer: Medicare HMO | Attending: Hematology and Oncology | Admitting: Hematology and Oncology

## 2017-10-04 ENCOUNTER — Encounter: Payer: Self-pay | Admitting: Hematology and Oncology

## 2017-10-04 ENCOUNTER — Inpatient Hospital Stay: Payer: Medicare HMO

## 2017-10-04 VITALS — BP 143/81 | HR 85 | Temp 98.4°F | Wt 182.5 lb

## 2017-10-04 DIAGNOSIS — D751 Secondary polycythemia: Secondary | ICD-10-CM | POA: Insufficient documentation

## 2017-10-04 DIAGNOSIS — E041 Nontoxic single thyroid nodule: Secondary | ICD-10-CM | POA: Diagnosis present

## 2017-10-04 DIAGNOSIS — Z720 Tobacco use: Secondary | ICD-10-CM | POA: Diagnosis not present

## 2017-10-04 LAB — CBC WITH DIFFERENTIAL/PLATELET
Basophils Absolute: 0.1 10*3/uL (ref 0–0.1)
Basophils Relative: 1 %
Eosinophils Absolute: 0.2 10*3/uL (ref 0–0.7)
Eosinophils Relative: 2 %
HCT: 46.4 % (ref 35.0–47.0)
Hemoglobin: 15.1 g/dL (ref 12.0–16.0)
Lymphocytes Relative: 27 %
Lymphs Abs: 2 10*3/uL (ref 1.0–3.6)
MCH: 29.3 pg (ref 26.0–34.0)
MCHC: 32.6 g/dL (ref 32.0–36.0)
MCV: 89.8 fL (ref 80.0–100.0)
Monocytes Absolute: 0.6 10*3/uL (ref 0.2–0.9)
Monocytes Relative: 8 %
Neutro Abs: 4.6 10*3/uL (ref 1.4–6.5)
Neutrophils Relative %: 62 %
Platelets: 167 10*3/uL (ref 150–440)
RBC: 5.16 MIL/uL (ref 3.80–5.20)
RDW: 15.5 % — ABNORMAL HIGH (ref 11.5–14.5)
WBC: 7.4 10*3/uL (ref 3.6–11.0)

## 2017-10-04 LAB — FERRITIN: Ferritin: 12 ng/mL (ref 11–307)

## 2017-10-04 NOTE — Progress Notes (Signed)
[name] day:  10/04/2017     Chief Complaint: Alicia Holder is a 67 y.o. 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  HPI:   The patient was last seen in the hematology clinic on 05/17/2017.  At that time, she was doing well.  She had chronic pain in her knees. She was trying to be more active by doing "cat exercises".  Exam was stable.  Hematocrit was 46.4.  No phlebotomy was needed.  We discussed smoking cessation.  She has had monthly hematocrit checks.   Hematocrit was 47.9 on 07/12/2017 and 49.9 on 08/23/2017.  She underwent phlebotomy x 2.  During the interim, she has been smoking 5 cigarettes/day.  She describes being tired sometimes when it is hot outside.  She is breathing better.  She walked 4000+ steps the other day.  She notes right knee pain as she "feels the screws".   Past Medical History:  Diagnosis Date  . Colon cancer [facility])     Past Surgical History:  Procedure Laterality Date  . ABDOMINAL HYSTERECTOMY    . APPENDECTOMY      History reviewed. No pertinent family history.  Social History:  reports that she has been smoking. She has a 41.00 pack-year smoking history. She has never used smokeless tobacco. She reports that she drinks alcohol. Her drug history is not on file.  She states that she smokes 3 ppd x 20 years.  She now smokes a pack of cigarettes a day.  She drinks 1-2 alcoholic drinks/month.  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  Patient denies known exposures to radiation or toxins. She lives with her mentally challenged daughter, Alicia Holder.  Her other daughter, Alicia Holder, helps out.  Her first cousin is [name].  She has 8 great grandchildren.  Patient does not drive.  She lives in a trailer in [place] that is near her daughter, Alicia Holder.  She has several grandchildren.  She has a GED and geriatric nursing assistant degree.  The patient is alone  today.  Allergies: No Known Allergies  Current Medications: Current Outpatient Medications  Medication Sig Dispense Refill  . ADVAIR DISKUS 250-50 MCG/DOSE AEPB Inhale 1 puff into the lungs 2 (two) times daily.    Marland Kitchen docusate sodium (COLACE) 100 MG capsule Take 100 mg by mouth daily.    Marland Kitchen lisinopril (PRINIVIL,ZESTRIL) 10 MG tablet Take 10 mg by mouth daily.    . montelukast (SINGULAIR) 10 MG tablet Take 10 mg by mouth at bedtime.    Marland Kitchen oxyCODONE (OXY IR/ROXICODONE) 5 MG immediate release tablet Take 5 mg by mouth 3 (three) times daily.    . sertraline (ZOLOFT) 50 MG tablet Take 50 mg by mouth daily.    . simvastatin (ZOCOR) 20 MG tablet Take 20 mg by mouth daily.    Marland Kitchen alendronate (FOSAMAX) 70 MG tablet Take 70 mg by mouth once a week.    . nicotine (NICODERM CQ - DOSED IN MG/24 HOURS) 21 mg/24hr patch Place 1 patch (21 mg total) onto the skin daily. (Patient not taking: Reported on 04/20/2017) 30 patch 1   No current facility-administered medications for this visit.     Review of Systems:  GENERAL:  Feels "tired sometimes".  No fevers, sweats or weight loss. PERFORMANCE STATUS (ECOG):  1 HEENT:  No visual changes, runny nose, sore throat, mouth sores or tenderness. Lungs:  COPD.  Breathing better.  No shortness of breath or cough.  No hemoptysis. Cardiac:  No chest pain, palpitations, orthopnea, or PND. GI:  No nausea, vomiting, diarrhea, constipation, melena or hematochezia. GU:  No urgency, frequency, dysuria, or hematuria. Musculoskeletal:  No back pain.  Knee pain (see HPI).  No muscle tenderness. Extremities:  No pain or swelling. Skin:  No rashes or skin changes. Neuro:  No headache, numbness or weakness, balance or coordination issues. Endocrine:  No diabetes, thyroid issues, hot flashes or night sweats. Psych:  No mood changes, depression or anxiety. Pain:  Knee pain. Review of systems:  All other systems reviewed and found to be negative.   Physical Exam: Blood pressure  (!) 143/81, pulse 85, temperature 98.4 F (36.9 C), temperature source Tympanic, weight 182 lb 8.7 oz (82.8 kg), SpO2 96 %. GENERAL:  Well developed, well nourished, woman sitting comfortably in the exam room in no acute distress. MENTAL STATUS:  Alert and oriented to person, place and time. HEAD:  Brown hair.  Normocephalic, atraumatic, face symmetric, no Cushingoid features. EYES:  Blue eyes.  Pupils equal round and reactive to light and accomodation.  No conjunctivitis or scleral icterus. ENT:  Oropharynx clear without lesion.  Tongue normal.  Three lower teeth.  No upper teeth.  Mucous membranes moist.  RESPIRATORY:  Clear to auscultation without rales, wheezes or rhonchi. CARDIOVASCULAR:  Regular rate and rhythm without murmur, rub or gallop. ABDOMEN:  Soft, non-tender, with active bowel sounds, and no hepatosplenomegaly.  No masses. SKIN:  Nicotine stained nails.  No rashes, ulcers or lesions. EXTREMITIES: Chronic lower extremity changes.  No skin discoloration or tenderness.  No palpable cords. LYMPH NODES: No palpable cervical, supraclavicular, axillary or inguinal adenopathy  NEUROLOGICAL: Unremarkable. PSYCH:  Appropriate.    Appointment on 10/04/2017  Component Date Value Ref Range Status  . WBC 10/04/2017 7.4  3.6 - 11.0 K/uL Final  . RBC 10/04/2017 5.16  3.80 - 5.20 MIL/uL Final  . Hemoglobin 10/04/2017 15.1  12.0 - 16.0 g/dL Final  . HCT 10/04/2017 46.4  35.0 - 47.0 % Final  . MCV 10/04/2017 89.8  80.0 - 100.0 fL Final  . MCH 10/04/2017 29.3  26.0 - 34.0 pg Final  . MCHC 10/04/2017 32.6  32.0 - 36.0 g/dL Final  . RDW 10/04/2017 15.5* 11.5 - 14.5 % Final  . Platelets 10/04/2017 167  150 - 440 K/uL Final  . Neutrophils Relative % 10/04/2017 62  % Final  . Neutro Abs 10/04/2017 4.6  1.4 - 6.5 K/uL Final  . Lymphocytes Relative 10/04/2017 27  % Final  . Lymphs Abs 10/04/2017 2.0  1.0 - 3.6 K/uL Final  . Monocytes Relative 10/04/2017 8  % Final  . Monocytes Absolute  10/04/2017 0.6  0.2 - 0.9 K/uL Final  . Eosinophils Relative 10/04/2017 2  % Final  . Eosinophils Absolute 10/04/2017 0.2  0 - 0.7 K/uL Final  . Basophils Relative 10/04/2017 1  % Final  . 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  She has a 60+ pack year smoking history.  She denies sleep apnea.   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  Last colonoscopy was 10/2012 (next due 2019 per patient).  She has lost 120 pounds in 3 years with portion control and food choices.  Work-up on 01/04/2017 revealed a hematocrit of 50.8, hemoglobin 17, MCV 94.7, platelets 173,000, white count 7400 with an ANC of 5100. Differential was normal.  Normal labs included CMP, epo level (10.7), ferritin (27), with an iron saturation (6%), JAK2 V617F and exons 12-15 were negative.  BCR-ABL testing was negative for e1a2 (p190), e13a2 (b2a2, p210) and e14a2 (b3a2, p210) fusion transcripts. Carbon monoxide level was 17% (< 9% smokers).    Low dose chest CT on 03/16/2017 was lung-RADS 2, benign appearance or behavior.  There was left main and two-vessel coronary atherosclerosis.  There was a 4.2 cm  ascending thoracic aortic aneurysm with recommendation for annual imaging followup by CTA or MRA.  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 Suspected dominant 4.5 cm inferior left thyroid lobe nodule. Correlation with thyroid ultrasound was warranted. There was a small to moderate hiatal hernia.  Thyroid ultrasound on 04/03/2017 revealed 7 nodules.  Left mid thyroid nodule (labeled 4) met criteria for biopsy.  The left inferior thyroid nodule (labeled 7) would meet criteria for biopsy, however, was favored to be a pseudo nodule and extension of thyroid goiter into the mediastinum given the appearance  on prior CT.  Left thyroid nodules (labeled 5 and 6) both met criteria for surveillance. None of the right-sided nodules met criteria for surveillance or biopsy.  FNA thyroid biopsy x 2 on 04/20/2017 of the left mid thyroid nodule and left inferior thyroid nodule were benign (Bethesda category II) and c/w benign follicular nodule.    She began a phlebotomy program on 02/08/2017 (last 08/23/2017).  She undergoes small volume phlebotomy (300 cc) if her hematocrit is > 47.  Symptomatically, she feels tired at times.  Exam is stable.  Hematocrit is 46.4.  Plan: 1.  Labs today:  CBC with diff, ferritin. 2.  Secondary erythrocutosis:  Hematocrit is 46.4.  No phlebotomy today. 3.  Thyroid nodules:  Continue surveillance ultrasound (due 04/04/2018). 4.  Encourage smoking cessation. 5.  RTC monthly for CBC +/- phlebotomy. 6.  RTC in 4 months for labs (CBC with diff) and +/- phlebotomy.   Lequita Asal, MD  10/04/2017, 3:38 PM

## 2017-10-04 NOTE — Progress Notes (Signed)
Patient states she has cut down to smoking 5 cigarettes a day instead of a pack a day.

## 2017-11-01 ENCOUNTER — Inpatient Hospital Stay: Payer: Medicare HMO

## 2017-11-01 ENCOUNTER — Inpatient Hospital Stay: Payer: Medicare HMO | Attending: Hematology and Oncology

## 2017-11-01 DIAGNOSIS — D751 Secondary polycythemia: Secondary | ICD-10-CM | POA: Insufficient documentation

## 2017-11-01 LAB — CBC WITH DIFFERENTIAL/PLATELET
Basophils Absolute: 0.1 10*3/uL (ref 0–0.1)
Basophils Relative: 1 %
Eosinophils Absolute: 0.3 10*3/uL (ref 0–0.7)
Eosinophils Relative: 3 %
HCT: 45.6 % (ref 35.0–47.0)
Hemoglobin: 15.1 g/dL (ref 12.0–16.0)
Lymphocytes Relative: 20 %
Lymphs Abs: 1.8 10*3/uL (ref 1.0–3.6)
MCH: 29.6 pg (ref 26.0–34.0)
MCHC: 33.1 g/dL (ref 32.0–36.0)
MCV: 89.5 fL (ref 80.0–100.0)
Monocytes Absolute: 0.7 10*3/uL (ref 0.2–0.9)
Monocytes Relative: 8 %
Neutro Abs: 6.1 10*3/uL (ref 1.4–6.5)
Neutrophils Relative %: 68 %
Platelets: 186 10*3/uL (ref 150–440)
RBC: 5.1 MIL/uL (ref 3.80–5.20)
RDW: 15.9 % — ABNORMAL HIGH (ref 11.5–14.5)
WBC: 9 10*3/uL (ref 3.6–11.0)

## 2017-11-01 NOTE — Progress Notes (Signed)
Hematocrit 45.6 today. No phlebotomy required.

## 2017-11-27 ENCOUNTER — Inpatient Hospital Stay: Payer: Medicare HMO

## 2017-11-29 ENCOUNTER — Inpatient Hospital Stay: Payer: Medicare HMO

## 2017-12-27 ENCOUNTER — Inpatient Hospital Stay: Payer: Medicare HMO

## 2018-01-01 ENCOUNTER — Inpatient Hospital Stay: Payer: Medicare HMO

## 2018-01-01 ENCOUNTER — Inpatient Hospital Stay: Payer: Medicare HMO | Attending: Hematology and Oncology

## 2018-01-01 DIAGNOSIS — D751 Secondary polycythemia: Secondary | ICD-10-CM | POA: Insufficient documentation

## 2018-01-01 LAB — CBC WITH DIFFERENTIAL/PLATELET
Abs Immature Granulocytes: 0.01 10*3/uL (ref 0.00–0.07)
Basophils Absolute: 0.1 10*3/uL (ref 0.0–0.1)
Basophils Relative: 1 %
Eosinophils Absolute: 0.2 10*3/uL (ref 0.0–0.5)
Eosinophils Relative: 3 %
HCT: 48 % — ABNORMAL HIGH (ref 36.0–46.0)
Hemoglobin: 15.1 g/dL — ABNORMAL HIGH (ref 12.0–15.0)
Immature Granulocytes: 0 %
Lymphocytes Relative: 30 %
Lymphs Abs: 2.2 10*3/uL (ref 0.7–4.0)
MCH: 29 pg (ref 26.0–34.0)
MCHC: 31.5 g/dL (ref 30.0–36.0)
MCV: 92.3 fL (ref 80.0–100.0)
Monocytes Absolute: 0.6 10*3/uL (ref 0.1–1.0)
Monocytes Relative: 8 %
Neutro Abs: 4.3 10*3/uL (ref 1.7–7.7)
Neutrophils Relative %: 58 %
Platelets: 188 10*3/uL (ref 150–400)
RBC: 5.2 MIL/uL — ABNORMAL HIGH (ref 3.87–5.11)
RDW: 15.9 % — ABNORMAL HIGH (ref 11.5–15.5)
WBC: 7.4 10*3/uL (ref 4.0–10.5)
nRBC: 0 % (ref 0.0–0.2)

## 2018-02-07 ENCOUNTER — Ambulatory Visit: Payer: Medicare HMO | Admitting: Hematology and Oncology

## 2018-02-07 ENCOUNTER — Other Ambulatory Visit: Payer: Medicare HMO

## 2018-02-09 ENCOUNTER — Other Ambulatory Visit: Payer: Self-pay | Admitting: Internal Medicine

## 2018-02-09 DIAGNOSIS — Z1231 Encounter for screening mammogram for malignant neoplasm of breast: Secondary | ICD-10-CM

## 2018-02-09 DIAGNOSIS — Z13820 Encounter for screening for osteoporosis: Secondary | ICD-10-CM

## 2018-02-19 ENCOUNTER — Inpatient Hospital Stay: Payer: Medicare HMO | Attending: Hematology and Oncology

## 2018-02-19 ENCOUNTER — Inpatient Hospital Stay (HOSPITAL_BASED_OUTPATIENT_CLINIC_OR_DEPARTMENT_OTHER): Payer: Medicare HMO | Admitting: Hematology and Oncology

## 2018-02-19 ENCOUNTER — Inpatient Hospital Stay: Payer: Medicare HMO

## 2018-02-19 VITALS — BP 141/83 | HR 71 | Temp 97.8°F | Resp 20 | Wt 173.3 lb

## 2018-02-19 DIAGNOSIS — E041 Nontoxic single thyroid nodule: Secondary | ICD-10-CM

## 2018-02-19 DIAGNOSIS — Z720 Tobacco use: Secondary | ICD-10-CM

## 2018-02-19 DIAGNOSIS — D751 Secondary polycythemia: Secondary | ICD-10-CM | POA: Diagnosis not present

## 2018-02-19 DIAGNOSIS — G8929 Other chronic pain: Secondary | ICD-10-CM

## 2018-02-19 DIAGNOSIS — M25561 Pain in right knee: Secondary | ICD-10-CM

## 2018-02-19 LAB — CBC WITH DIFFERENTIAL/PLATELET
Abs Immature Granulocytes: 0.01 10*3/uL (ref 0.00–0.07)
Basophils Absolute: 0 10*3/uL (ref 0.0–0.1)
Basophils Relative: 1 %
Eosinophils Absolute: 0.1 10*3/uL (ref 0.0–0.5)
Eosinophils Relative: 2 %
HCT: 48.4 % — ABNORMAL HIGH (ref 36.0–46.0)
Hemoglobin: 15.5 g/dL — ABNORMAL HIGH (ref 12.0–15.0)
Immature Granulocytes: 0 %
Lymphocytes Relative: 31 %
Lymphs Abs: 1.9 10*3/uL (ref 0.7–4.0)
MCH: 29 pg (ref 26.0–34.0)
MCHC: 32 g/dL (ref 30.0–36.0)
MCV: 90.5 fL (ref 80.0–100.0)
Monocytes Absolute: 0.4 10*3/uL (ref 0.1–1.0)
Monocytes Relative: 7 %
Neutro Abs: 3.7 10*3/uL (ref 1.7–7.7)
Neutrophils Relative %: 59 %
Platelets: 138 10*3/uL — ABNORMAL LOW (ref 150–400)
RBC: 5.35 MIL/uL — ABNORMAL HIGH (ref 3.87–5.11)
RDW: 14.8 % (ref 11.5–15.5)
WBC: 6.1 10*3/uL (ref 4.0–10.5)
nRBC: 0 % (ref 0.0–0.2)

## 2018-02-19 NOTE — Progress Notes (Signed)
[place] [facility] day:  02/19/2018     Chief Complaint: Alicia Holder is a 68 y.o. 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  HPI:   The patient was last seen in the hematology clinic on 10/04/2017.  At that time, patient was doing well overall.  She described intermittent fatigue when it was hot outside.  Breathing better.  Continues to smoke (5 cigarettes/day).  Patient making efforts to be more active.  Complains of chronic pain in her right knee; "I can feel the screws".  WBC 7400 ([place] 4600).  Hemoglobin 13.1, hematocrit 46.4, and platelets 167,000.  Ferritin 12 ng/mL.  CBC on 11/01/2017 revealed a WBC of 9000 (ANC 6100).  Hemoglobin 15.1, hematocrit 45.6, MCV 89.5, and platelets 186,000.  No phlebotomy required. CBC on 01/01/2018 revealed a WBC of 7400 ([place] 4300).  Hemoglobin 15.1, hematocrit 48.0, MCV 92.3, and platelets 188,000.  Underwent small-volume phlebotomy.  She is scheduled for her mammogram and bone density on 02/22/2018.  During the interim, patient has been had the "sniffles" and cough for the last few days. She otherwise feels generally well. Patient continues to try to reduce the amount that she is smoking. She states, "I am only smoking 5 a day. I am substituting with dum dums". Patient has chronic pain in her RIGHT knee. Patient denies that she has experienced any B symptoms. She denies any interval infections. She denies any bruising or bleeding.   Patient is on a daily stool softener. She has changed her diet and is trying to eat more fiber.   Patient advises that she maintains an adequate appetite. She is eating well. Weight today is 173 lb 4.5 oz (78.6 kg), which compared to her last visit to the clinic, represents a 9 pound weight loss.   Patient complains of pain rated 5/10 in the clinic today.   Past Medical History:  Diagnosis Date  . Colon cancer [facility])     Past Surgical History:  Procedure  Laterality Date  . ABDOMINAL HYSTERECTOMY    . APPENDECTOMY      No family history on file.  Social History:  reports that she has been smoking. She has a 41.00 pack-year smoking history. She has never used smokeless tobacco. She reports current alcohol use. No history on file for drug.  She states that she smokes 3 ppd x 20 years.  She now smokes a pack of cigarettes a day.  She drinks 1-2 alcoholic drinks/month.  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  Patient denies known exposures to radiation or toxins. She lives with her mentally challenged daughter, Alicia Holder.  Her other daughter, Alicia Holder, helps out.  Her first cousin is Alicia Holder, Therapist, music).  She has 8 great grandchildren.  Patient does not drive.  She lives in a trailer in [place] that is near her daughter, Alicia Holder.  She has several grandchildren.  She has a GED and geriatric nursing assistant degree.  The patient is alone today.  Allergies: No Known Allergies  Current Medications: Current Outpatient Medications  Medication Sig Dispense Refill  . ADVAIR DISKUS 250-50 MCG/DOSE AEPB Inhale 1 puff into the lungs 2 (two) times daily.    Marland Kitchen alendronate (FOSAMAX) 70 MG tablet Take 70 mg by mouth once a week.    . docusate sodium (COLACE) 100 MG capsule Take 100 mg by mouth daily.    Marland Kitchen lisinopril (PRINIVIL,ZESTRIL)  10 MG tablet Take 10 mg by mouth daily.    . montelukast (SINGULAIR) 10 MG tablet Take 10 mg by mouth at bedtime.    Marland Kitchen oxyCODONE (OXY IR/ROXICODONE) 5 MG immediate release tablet Take 5 mg by mouth 3 (three) times daily.    . sertraline (ZOLOFT) 50 MG tablet Take 50 mg by mouth daily.    . simvastatin (ZOCOR) 20 MG tablet Take 20 mg by mouth daily.     No current facility-administered medications for this visit.     Review of Systems:  GENERAL:  Feels good.  No fevers, sweats.  Weight loss of 9 pounds. PERFORMANCE STATUS (ECOG):  1 HEENT:  Sniffles.  URI.  No visual changes,  sore throat, mouth sores or tenderness. Lungs:  COPD.  No shortness of breath or cough.  No hemoptysis. Cardiac:  No chest pain, palpitations, orthopnea, or PND. GI:  Constipation.  No nausea, vomiting, diarrhea, melena or hematochezia. GU:  No urgency, frequency, dysuria, or hematuria. Musculoskeletal:  No back pain.  Chronic right knee pain.  No muscle tenderness. Extremities:  No pain or swelling. Skin:  No rashes or skin changes. Neuro:  No headache, numbness or weakness, balance or coordination issues. Endocrine:  No diabetes, thyroid issues, hot flashes or night sweats. Psych:  No mood changes, depression or anxiety. Pain:  Knee pain (5 out of 10). Review of systems:  All other systems reviewed and found to be negative.   Physical Exam: Blood pressure (!) 141/83, pulse 71, temperature 97.8 F (36.6 C), temperature source Oral, resp. rate 20, weight 173 lb 4.5 oz (78.6 kg), SpO2 94 %. GENERAL:  Well developed, well nourished, woman sitting comfortably in the exam room in no acute distress. MENTAL STATUS:  Alert and oriented to person, place and time. HEAD:  Styled brown hair.  Normocephalic, atraumatic, face symmetric, no Cushingoid features. EYES:  Blue eyes.  Pupils equal round and reactive to light and accomodation.  No conjunctivitis or scleral icterus. ENT:  Oropharynx clear without lesion.  Tongue normal. Mucous membranes moist.  RESPIRATORY:  Clear to auscultation without rales, wheezes or rhonchi. CARDIOVASCULAR:  Regular rate and rhythm without murmur, rub or gallop. ABDOMEN:  Soft, non-tender, with active bowel sounds, and no hepatosplenomegaly.  No masses. SKIN:  Nicotine stained nails.  No rashes, ulcers or lesions. EXTREMITIES: No edema, no skin discoloration or tenderness.  No palpable cords. LYMPH NODES: No palpable cervical, supraclavicular, axillary or inguinal adenopathy  NEUROLOGICAL: Unremarkable. PSYCH:  Appropriate.    Appointment on 02/19/2018  Component  Date Value Ref Range Status  . WBC 02/19/2018 6.1  4.0 - 10.5 K/uL Final  . RBC 02/19/2018 5.35* 3.87 - 5.11 MIL/uL Final  . Hemoglobin 02/19/2018 15.5* 12.0 - 15.0 g/dL Final  . HCT 02/19/2018 48.4* 36.0 - 46.0 % Final  . MCV 02/19/2018 90.5  80.0 - 100.0 fL Final  . MCH 02/19/2018 29.0  26.0 - 34.0 pg Final  . MCHC 02/19/2018 32.0  30.0 - 36.0 g/dL Final  . RDW 02/19/2018 14.8  11.5 - 15.5 % Final  . Platelets 02/19/2018 138* 150 - 400 K/uL Final  . nRBC 02/19/2018 0.0  0.0 - 0.2 % Final  . Neutrophils Relative % 02/19/2018 59  % Final  . Neutro Abs 02/19/2018 3.7  1.7 - 7.7 K/uL Final  . Lymphocytes Relative 02/19/2018 31  % Final  . Lymphs Abs 02/19/2018 1.9  0.7 - 4.0 K/uL Final  . Monocytes Relative 02/19/2018 7  % Final  .  Monocytes Absolute 02/19/2018 0.4  0.1 - 1.0 K/uL Final  . Eosinophils Relative 02/19/2018 2  % Final  . Eosinophils Absolute 02/19/2018 0.1  0.0 - 0.5 K/uL Final  . Basophils Relative 02/19/2018 1  % Final  . Basophils Absolute 02/19/2018 0.0  0.0 - 0.1 K/uL Final  . Immature Granulocytes 02/19/2018 0  % Final  . 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  She has a 60+ pack year smoking history.  She denies sleep apnea.   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  Last colonoscopy was 10/2012 (next due 2019 per patient).  She has lost 120 pounds in 3 years with portion control and food choices.  Work-up on 01/04/2017 revealed a hematocrit of 50.8, hemoglobin 17, MCV 94.7, platelets 173,000, white count 7400 with an ANC of 5100. Differential was normal.  Normal labs included CMP, epo level (10.7), ferritin (27), with an iron saturation (6%), JAK2 V617F and exons 12-15 were negative.  BCR-ABL testing was negative for e1a2 (p190), e13a2 (b2a2, p210) and e14a2  (b3a2, p210) fusion transcripts. Carbon monoxide level was 17% (< 9% smokers).    Low dose chest CT on 03/16/2017 was lung-RADS 2, benign appearance or behavior.  There was left main and two-vessel coronary atherosclerosis.  There was a 4.2 cm  ascending thoracic aortic aneurysm with recommendation for annual imaging followup by CTA or MRA.  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 Suspected dominant 4.5 cm inferior left thyroid lobe nodule. Correlation with thyroid ultrasound was warranted. There was a small to moderate hiatal hernia.  Thyroid ultrasound on 04/03/2017 revealed 7 nodules.  Left mid thyroid nodule (labeled 4) met criteria for biopsy.  The left inferior thyroid nodule (labeled 7) would meet criteria for biopsy, however, was favored to be a pseudo nodule and extension of thyroid goiter into the mediastinum given the appearance on prior CT.  Left thyroid nodules (labeled 5 and 6) both met criteria for surveillance. None of the right-sided nodules met criteria for surveillance or biopsy.  FNA thyroid biopsy x 2 on 04/20/2017 of the left mid thyroid nodule and left inferior thyroid nodule were benign (Bethesda category II) and c/w benign follicular nodule.    She began a phlebotomy program on 02/08/2017 (last 01/01/2018).  She undergoes small volume phlebotomy (300 cc) if her hematocrit is > 47.  Symptomatically, she has a URI.  Exam is stable.  Hematocrit is 48.4.  Plan: 1.  Labs today:  CBC with diff, ferritin. 2.  Secondary erythrocutosis  Hematocrit is 48.4.  Small volume phlebotomy today.  Encourage smoking cessation (down to 5 cigarettes/day). 3.  Thyroid nodules  Continue surveillance ultrasound (due 04/04/2018). 4.  RTC monthly for CBC +/- phlebotomy. 5.  RTC in 4 months for MD assessment, labs (CBC with diff), and +/- phlebotomy.   Honor Loh, NP  02/19/2018, 3:31 PM   I saw and evaluated the patient, participating in the key  portions of the service and reviewing pertinent diagnostic studies and records.  I reviewed the nurse practitioner's note and agree with the findings and the plan.  The assessment and plan were discussed with the patient.  A few questions were asked by the patient and answered.   Melissa  Mike Gip, MD 02/19/2018, 3:31 PM

## 2018-02-19 NOTE — Progress Notes (Signed)
Pt here for follow up. Denies any concerns at this time.  

## 2018-02-22 ENCOUNTER — Inpatient Hospital Stay: Admission: RE | Admit: 2018-02-22 | Payer: Medicare HMO | Source: Ambulatory Visit

## 2018-03-02 ENCOUNTER — Telehealth: Payer: Self-pay

## 2018-03-02 NOTE — Telephone Encounter (Signed)
Call pt regarding lung screening. Left message for pt to return call.  

## 2018-03-03 ENCOUNTER — Telehealth: Payer: Self-pay

## 2018-03-03 NOTE — Telephone Encounter (Signed)
Call pt regarding lung screening. Pt is a curent smoker, smoking about 1/4 pack per day. Pt would like scan to be in the afternoon on any day. Pt denies any new health issues at this time.

## 2018-03-06 ENCOUNTER — Telehealth: Payer: Self-pay | Admitting: *Deleted

## 2018-03-06 DIAGNOSIS — Z122 Encounter for screening for malignant neoplasm of respiratory organs: Secondary | ICD-10-CM

## 2018-03-06 NOTE — Telephone Encounter (Signed)
Patient has been notified that the annual lung cancer screening low dose CT scan is due currently or will be in the near future.  Confirmed that the patient is within the age range of 60-80, and asymptomatic, and currently exhibits no signs or symptoms of lung cancer.  Patient denies illness that would prevent curative treatment for lung cancer if found.  Verified smoking history, 0.25PPD CURRENTLY WITH A 41PKYR HX .  The shared decision making visit was completed on 03-16-17.  Patient is agreeable for the CT scan to be scheduled.  Will call patient back with date and time of appointment.

## 2018-03-07 ENCOUNTER — Telehealth: Payer: Self-pay | Admitting: *Deleted

## 2018-03-07 NOTE — Telephone Encounter (Signed)
Called pt to inform her of her appt for ldct screening onTuesday 03/20/2018 @ 2:40pm here @ [facility], voiced understanding.  appt mailed to patient.

## 2018-03-19 ENCOUNTER — Inpatient Hospital Stay: Payer: Medicare HMO | Attending: Hematology and Oncology

## 2018-03-19 ENCOUNTER — Inpatient Hospital Stay: Payer: Medicare HMO

## 2018-03-19 DIAGNOSIS — D751 Secondary polycythemia: Secondary | ICD-10-CM | POA: Diagnosis not present

## 2018-03-19 LAB — CBC WITH DIFFERENTIAL/PLATELET
Abs Immature Granulocytes: 0.02 10*3/uL (ref 0.00–0.07)
Basophils Absolute: 0.1 10*3/uL (ref 0.0–0.1)
Basophils Relative: 1 %
Eosinophils Absolute: 0.2 10*3/uL (ref 0.0–0.5)
Eosinophils Relative: 3 %
HCT: 45.6 % (ref 36.0–46.0)
Hemoglobin: 14.1 g/dL (ref 12.0–15.0)
Immature Granulocytes: 0 %
Lymphocytes Relative: 32 %
Lymphs Abs: 2.1 10*3/uL (ref 0.7–4.0)
MCH: 28.1 pg (ref 26.0–34.0)
MCHC: 30.9 g/dL (ref 30.0–36.0)
MCV: 91 fL (ref 80.0–100.0)
Monocytes Absolute: 0.6 10*3/uL (ref 0.1–1.0)
Monocytes Relative: 9 %
Neutro Abs: 3.8 10*3/uL (ref 1.7–7.7)
Neutrophils Relative %: 55 %
Platelets: 182 10*3/uL (ref 150–400)
RBC: 5.01 MIL/uL (ref 3.87–5.11)
RDW: 15.5 % (ref 11.5–15.5)
WBC: 6.7 10*3/uL (ref 4.0–10.5)
nRBC: 0 % (ref 0.0–0.2)

## 2018-03-20 ENCOUNTER — Ambulatory Visit
Admission: RE | Admit: 2018-03-20 | Discharge: 2018-03-20 | Disposition: A | Discharge status: Home / Self Care | Payer: Medicare HMO | Source: Ambulatory Visit | Attending: Oncology | Admitting: Oncology

## 2018-03-20 DIAGNOSIS — Z122 Encounter for screening for malignant neoplasm of respiratory organs: Secondary | ICD-10-CM | POA: Insufficient documentation

## 2018-03-20 DIAGNOSIS — F17210 Nicotine dependence, cigarettes, uncomplicated: Secondary | ICD-10-CM | POA: Diagnosis not present

## 2018-03-23 ENCOUNTER — Telehealth: Payer: Self-pay | Admitting: *Deleted

## 2018-03-23 NOTE — Telephone Encounter (Signed)
Attempted to contact patient to discuss LDCT lung cancer screening results.  Unable to reach patient at this time.  Left Message for them to return call to [phone] to either myself or Shawn Perkins RN to discuss the results.    

## 2018-03-24 ENCOUNTER — Telehealth: Payer: Self-pay | Admitting: *Deleted

## 2018-03-24 NOTE — Telephone Encounter (Signed)
Attempted to contact patient to discuss LDCT lung cancer screening results.  Unable to reach patient at this time.  Left Message for them to return call to [phone] to either myself or Shawn Perkins RN to discuss the results.    

## 2018-03-28 ENCOUNTER — Encounter: Payer: Self-pay | Admitting: *Deleted

## 2018-03-28 ENCOUNTER — Telehealth: Payer: Self-pay | Admitting: *Deleted

## 2018-03-28 NOTE — Telephone Encounter (Signed)
Notified patient of LDCT lung cancer screening program results with recommendation for 12 month follow up imaging.  Also notified of incidental findings noted below and is encouraged to discuss further questions with PCP who will receive a copy of this not and/or the CT reports.  Patient verbalized understanding.     IMPRESSION: 1. Lung-RADS 2, benign appearance or behavior. Continue annual screening with low-dose chest CT without contrast in 12 months. 2. Ascending Aortic aneurysm NOS (ICD10-I71.9), stable. Recommend annual imaging followup by CTA or MRA. This recommendation follows 2010 ACCF/AHA/AATS/ACR/ASA/SCA/SCAI/SIR/STS/SVM Guidelines for the Diagnosis and Management of Patients with Thoracic Aortic Disease. Circulation. 2010; 121: V694-H038. Aortic aneurysm NOS (ICD10-I71.9). 3. Aortic atherosclerosis (ICD10-170.0). Coronary artery calcification. 4. Enlarged pulmonic trunk, indicative of pulmonary arterial hypertension. 5.  Emphysema (ICD10-J43.9). 6. Moderate hiatal hernia

## 2018-03-30 ENCOUNTER — Encounter: Payer: Self-pay | Admitting: *Deleted

## 2018-04-16 ENCOUNTER — Inpatient Hospital Stay: Payer: Medicare HMO | Attending: Hematology and Oncology

## 2018-04-16 ENCOUNTER — Inpatient Hospital Stay: Payer: Medicare HMO

## 2018-04-16 ENCOUNTER — Other Ambulatory Visit: Payer: Self-pay

## 2018-04-16 VITALS — BP 135/85 | HR 73 | Temp 97.6°F | Resp 18

## 2018-04-16 DIAGNOSIS — D751 Secondary polycythemia: Secondary | ICD-10-CM | POA: Diagnosis present

## 2018-04-16 LAB — CBC WITH DIFFERENTIAL/PLATELET
Abs Immature Granulocytes: 0.02 10*3/uL (ref 0.00–0.07)
Basophils Absolute: 0.1 10*3/uL (ref 0.0–0.1)
Basophils Relative: 1 %
Eosinophils Absolute: 0.2 10*3/uL (ref 0.0–0.5)
Eosinophils Relative: 2 %
HCT: 50.2 % — ABNORMAL HIGH (ref 36.0–46.0)
Hemoglobin: 16 g/dL — ABNORMAL HIGH (ref 12.0–15.0)
Immature Granulocytes: 0 %
Lymphocytes Relative: 24 %
Lymphs Abs: 2.2 10*3/uL (ref 0.7–4.0)
MCH: 28.7 pg (ref 26.0–34.0)
MCHC: 31.9 g/dL (ref 30.0–36.0)
MCV: 90 fL (ref 80.0–100.0)
Monocytes Absolute: 0.6 10*3/uL (ref 0.1–1.0)
Monocytes Relative: 7 %
Neutro Abs: 6.2 10*3/uL (ref 1.7–7.7)
Neutrophils Relative %: 66 %
Platelets: 166 10*3/uL (ref 150–400)
RBC: 5.58 MIL/uL — ABNORMAL HIGH (ref 3.87–5.11)
RDW: 17.2 % — ABNORMAL HIGH (ref 11.5–15.5)
WBC: 9.2 10*3/uL (ref 4.0–10.5)
nRBC: 0 % (ref 0.0–0.2)

## 2018-05-14 ENCOUNTER — Inpatient Hospital Stay: Payer: Medicare HMO | Attending: Hematology and Oncology

## 2018-05-14 ENCOUNTER — Other Ambulatory Visit: Payer: Self-pay

## 2018-05-14 DIAGNOSIS — D751 Secondary polycythemia: Secondary | ICD-10-CM | POA: Diagnosis present

## 2018-05-14 LAB — CBC WITH DIFFERENTIAL/PLATELET
Abs Immature Granulocytes: 0.03 10*3/uL (ref 0.00–0.07)
Basophils Absolute: 0.1 10*3/uL (ref 0.0–0.1)
Basophils Relative: 1 %
Eosinophils Absolute: 0.2 10*3/uL (ref 0.0–0.5)
Eosinophils Relative: 2 %
HCT: 45.6 % (ref 36.0–46.0)
Hemoglobin: 14.5 g/dL (ref 12.0–15.0)
Immature Granulocytes: 0 %
Lymphocytes Relative: 25 %
Lymphs Abs: 2.3 10*3/uL (ref 0.7–4.0)
MCH: 28.3 pg (ref 26.0–34.0)
MCHC: 31.8 g/dL (ref 30.0–36.0)
MCV: 88.9 fL (ref 80.0–100.0)
Monocytes Absolute: 0.7 10*3/uL (ref 0.1–1.0)
Monocytes Relative: 8 %
Neutro Abs: 5.9 10*3/uL (ref 1.7–7.7)
Neutrophils Relative %: 64 %
Platelets: 181 10*3/uL (ref 150–400)
RBC: 5.13 MIL/uL — ABNORMAL HIGH (ref 3.87–5.11)
RDW: 18 % — ABNORMAL HIGH (ref 11.5–15.5)
WBC: 9.2 10*3/uL (ref 4.0–10.5)
nRBC: 0 % (ref 0.0–0.2)

## 2018-06-08 ENCOUNTER — Encounter: Payer: Self-pay | Admitting: Hematology and Oncology

## 2018-06-11 ENCOUNTER — Ambulatory Visit: Payer: Medicare HMO | Admitting: Hematology and Oncology

## 2018-06-11 ENCOUNTER — Other Ambulatory Visit: Payer: Medicare HMO

## 2018-07-10 ENCOUNTER — Telehealth: Payer: Self-pay | Admitting: Hematology and Oncology

## 2018-07-10 ENCOUNTER — Other Ambulatory Visit: Payer: Self-pay

## 2018-07-10 NOTE — Telephone Encounter (Signed)
Spoke to pt and completed travel screen. Also explained about addl screening questions they will be asked, new guidelines about mask req, no visitors, and fever checks °

## 2018-07-11 ENCOUNTER — Ambulatory Visit: Payer: Medicare HMO

## 2018-07-11 ENCOUNTER — Encounter: Payer: Self-pay | Admitting: Hematology and Oncology

## 2018-07-11 ENCOUNTER — Inpatient Hospital Stay (HOSPITAL_BASED_OUTPATIENT_CLINIC_OR_DEPARTMENT_OTHER): Payer: Medicare HMO | Admitting: Hematology and Oncology

## 2018-07-11 ENCOUNTER — Inpatient Hospital Stay: Payer: Medicare HMO | Attending: Hematology and Oncology

## 2018-07-11 VITALS — BP 141/88 | HR 91 | Temp 98.1°F | Resp 18

## 2018-07-11 VITALS — BP 146/91 | HR 83 | Temp 98.3°F | Resp 18 | Wt 176.9 lb

## 2018-07-11 DIAGNOSIS — G8929 Other chronic pain: Secondary | ICD-10-CM | POA: Diagnosis not present

## 2018-07-11 DIAGNOSIS — D751 Secondary polycythemia: Secondary | ICD-10-CM

## 2018-07-11 DIAGNOSIS — Z720 Tobacco use: Secondary | ICD-10-CM

## 2018-07-11 DIAGNOSIS — M25569 Pain in unspecified knee: Secondary | ICD-10-CM | POA: Diagnosis not present

## 2018-07-11 DIAGNOSIS — E042 Nontoxic multinodular goiter: Secondary | ICD-10-CM | POA: Insufficient documentation

## 2018-07-11 DIAGNOSIS — E041 Nontoxic single thyroid nodule: Secondary | ICD-10-CM

## 2018-07-11 LAB — CBC WITH DIFFERENTIAL/PLATELET
Abs Immature Granulocytes: 0.02 10*3/uL (ref 0.00–0.07)
Basophils Absolute: 0.1 10*3/uL (ref 0.0–0.1)
Basophils Relative: 1 %
Eosinophils Absolute: 0.2 10*3/uL (ref 0.0–0.5)
Eosinophils Relative: 3 %
HCT: 51 % — ABNORMAL HIGH (ref 36.0–46.0)
Hemoglobin: 16.2 g/dL — ABNORMAL HIGH (ref 12.0–15.0)
Immature Granulocytes: 0 %
Lymphocytes Relative: 28 %
Lymphs Abs: 2.1 10*3/uL (ref 0.7–4.0)
MCH: 28.5 pg (ref 26.0–34.0)
MCHC: 31.8 g/dL (ref 30.0–36.0)
MCV: 89.6 fL (ref 80.0–100.0)
Monocytes Absolute: 0.6 10*3/uL (ref 0.1–1.0)
Monocytes Relative: 8 %
Neutro Abs: 4.5 10*3/uL (ref 1.7–7.7)
Neutrophils Relative %: 60 %
Platelets: 134 10*3/uL — ABNORMAL LOW (ref 150–400)
RBC: 5.69 MIL/uL — ABNORMAL HIGH (ref 3.87–5.11)
RDW: 17.5 % — ABNORMAL HIGH (ref 11.5–15.5)
WBC: 7.5 10*3/uL (ref 4.0–10.5)
nRBC: 0 % (ref 0.0–0.2)

## 2018-07-11 NOTE — Progress Notes (Signed)
Pt here for follow up. Denies any concerns.  

## 2018-07-11 NOTE — Patient Instructions (Signed)

## 2018-07-11 NOTE — Progress Notes (Signed)
[facility]  [address], [address] [place], [place] [address] Phone: [phone]  Fax: [phone]   Clinic Day:  07/11/2018  Referring physician: Ellamae Sia, MD  Chief Complaint: Alicia Holder is a 68 y.o. 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  HPI: The patient was last seen in the hematology clinic on 02/19/2018. At that time, she had a URI. Exam was stable. Hematocrit was 48.4. She underwent small volume phlebotomy. Smoking cessation was encouraged.  Labs followed: 03/19/2018: WBC 6,700, ANC 3,800, platelets 182,000, hemoglobin 14.1, hematocrit 45.6. 04/16/2018: WBC 9,200, ANC 6,200, platelets 166,000, hemoglobin 16.0, hematocrit 50.2.   She underwent phlebotomy. 05/14/2018: WBC 9,200, ANC 5,900, Platelets 181,000, hemoglobin 14.5, hematocrit 45.6.   Low dose chest CT on 03/20/2018 revealed benign appearance or behavior. Lung-RADS score was 2. Annual imaging follow up was recommended.   During the interim, she reports "I'm doing fantastic." She complains of chronic knee pain. Her bowels are normal. Her hernia is stable. She has been eating 6 small meals per day as opposed to 3.   She has a rash on bilateral arms that was previously all over her body. She attributes it to lotion her daughter gave her. It has improved with Benadryl.   She currently smokes 5-10 cigarettes per day. She is motivated to quit.    Past Medical History:  Diagnosis Date   Colon cancer [facility])     Past Surgical History:  Procedure Laterality Date   ABDOMINAL HYSTERECTOMY     APPENDECTOMY      No family history on file.  Social History:  reports that she has been smoking. She has a 41.00 pack-year smoking history. She has never used smokeless tobacco. She reports current alcohol use. No history on file for drug. She states that she smokes 3 ppd x 20 years.  She now smokes 5-10 cigarettes a day.  She drinks 1-2 alcoholic  drinks/month.  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  Patient denies known exposures to radiation or toxins. She lives with her mentally challenged daughter, Alicia Holder.  Her other daughter, Alicia Holder, helps out.  Her first cousin is Alicia Holder, Therapist, music).  She has 8 great grandchildren.  Patient does not drive.  She lives in a trailer in [place] that is near her daughter, Alicia Holder.  She has several grandchildren.  She has a GED and geriatric nursing assistant degree.  She has stayed safe during the pandemic, and is taking appropriate precautions. The patient is alone today.  Allergies: No Known Allergies  Current Medications: Current Outpatient Medications  Medication Sig Dispense Refill   ADVAIR DISKUS 250-50 MCG/DOSE AEPB Inhale 1 puff into the lungs 2 (two) times daily.     albuterol (VENTOLIN HFA) 108 (90 Base) MCG/ACT inhaler Inhale 2 puffs into the lungs every 4 (four) hours as needed.      alendronate (FOSAMAX) 70 MG tablet Take 70 mg by mouth once a week.     docusate sodium (COLACE) 100 MG capsule Take 100 mg by mouth daily.     lisinopril (PRINIVIL,ZESTRIL) 10 MG tablet Take 20 mg by mouth daily.      montelukast (SINGULAIR) 10 MG tablet Take 10 mg by mouth at bedtime.     oxyCODONE (OXY IR/ROXICODONE) 5 MG immediate release tablet Take 5 mg by mouth 3 (three) times daily.     sertraline (ZOLOFT) 50 MG tablet Take 50 mg  by mouth daily.     simvastatin (ZOCOR) 20 MG tablet Take 20 mg by mouth daily.     No current facility-administered medications for this visit.     Review of Systems  Constitutional: Negative for chills, diaphoresis, fever, malaise/fatigue and weight loss (up 3lbs).       Doing "fantastic."  HENT: Negative for congestion, hearing loss, sinus pain and sore throat.   Eyes: Negative for blurred vision.  Respiratory: Negative for cough, hemoptysis and shortness of breath.   Cardiovascular: Negative for chest pain,  palpitations, orthopnea and PND.  Gastrointestinal: Negative for constipation, diarrhea, melena, nausea and vomiting.  Genitourinary: Negative for dysuria, frequency, hematuria and urgency.  Musculoskeletal: Positive for joint pain (chronic right kneee pain (5/10)). Negative for back pain and myalgias.  Skin: Positive for rash (bilateral arms).  Neurological: Negative for dizziness, tingling, sensory change, weakness and headaches.  Endo/Heme/Allergies: Does not bruise/bleed easily.  Psychiatric/Behavioral: Negative for depression and memory loss. The patient is not nervous/anxious and does not have insomnia.   All other systems reviewed and are negative.  Performance status (ECOG): 1  Physical Exam  Constitutional: She is oriented to person, place, and time. She appears well-developed and well-nourished. No distress.  HENT:  Head: Normocephalic and atraumatic.  Mouth/Throat: Oropharynx is clear and moist. No oropharyngeal exudate.  Styled auburn/brown hair.  Wearing a mask.  Edentulous.  Eyes: Pupils are equal, round, and reactive to light. Conjunctivae and EOM are normal. No scleral icterus.  Blue eyes.  Neck: Normal range of motion. Neck supple. No JVD present.  Cardiovascular: Normal rate, regular rhythm and normal heart sounds.  No murmur heard. Pulmonary/Chest: Effort normal and breath sounds normal. No respiratory distress. She has no wheezes. She has no rales.  Abdominal: Soft. Bowel sounds are normal. She exhibits no distension and no mass. There is no hepatosplenomegaly. There is no abdominal tenderness. There is no rebound and no guarding. A hernia (reducible umbilical) is present.  Musculoskeletal: Normal range of motion.        General: No edema.  Lymphadenopathy:    She has no cervical adenopathy.    She has no axillary adenopathy.       Right: No supraclavicular adenopathy present.       Left: No supraclavicular adenopathy present.  Neurological: She is alert and  oriented to person, place, and time.  Skin: Skin is warm and dry. Bruising (scattered, upper extremities) and rash (bilateral arms) noted. She is not diaphoretic. No pallor.  Nicotine stained nails.  Psychiatric: She has a normal mood and affect. Her behavior is normal. Judgment and thought content normal.  Nursing note and vitals reviewed.   Appointment on 07/11/2018  Component Date Value Ref Range Status   WBC 07/11/2018 7.5  4.0 - 10.5 K/uL Final   RBC 07/11/2018 5.69* 3.87 - 5.11 MIL/uL Final   Hemoglobin 07/11/2018 16.2* 12.0 - 15.0 g/dL Final   HCT 07/11/2018 51.0* 36.0 - 46.0 % Final   MCV 07/11/2018 89.6  80.0 - 100.0 fL Final   MCH 07/11/2018 28.5  26.0 - 34.0 pg Final   MCHC 07/11/2018 31.8  30.0 - 36.0 g/dL Final   RDW 07/11/2018 17.5* 11.5 - 15.5 % Final   Platelets 07/11/2018 134* 150 - 400 K/uL Final   nRBC 07/11/2018 0.0  0.0 - 0.2 % Final   Neutrophils Relative % 07/11/2018 60  % Final   Neutro Abs 07/11/2018 4.5  1.7 - 7.7 K/uL Final   Lymphocytes Relative 07/11/2018  28  % Final   Lymphs Abs 07/11/2018 2.1  0.7 - 4.0 K/uL Final   Monocytes Relative 07/11/2018 8  % Final   Monocytes Absolute 07/11/2018 0.6  0.1 - 1.0 K/uL Final   Eosinophils Relative 07/11/2018 3  % Final   Eosinophils Absolute 07/11/2018 0.2  0.0 - 0.5 K/uL Final   Basophils Relative 07/11/2018 1  % Final   Basophils Absolute 07/11/2018 0.1  0.0 - 0.1 K/uL Final   Immature Granulocytes 07/11/2018 0  % Final   Abs Immature Granulocytes 07/11/2018 0.02  0.00 - 0.07 K/uL Final   Performed at [facility], [address] [place], [place] [address]    Assessment:  Alicia Holder is a 68 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  She has a 60+ pack year smoking history.  She denies sleep apnea.   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  Last colonoscopy was 10/2012 (next due 2019 per patient).  She has lost 120 pounds in 3 years with portion  control and food choices.  Work-up on 01/04/2017 revealed a hematocrit of 50.8, hemoglobin 17, MCV 94.7, platelets 173,000, white count 7400 with an ANC of 5100. Differential was normal.  Normal labs included CMP, epo level (10.7), ferritin (27), with an iron saturation (6%), JAK2 V617F and exons 12-15 were negative.  BCR-ABL testing was negative for e1a2 (p190), e13a2 (b2a2, p210) and e14a2 (b3a2, p210) fusion transcripts. Carbon monoxide level was 17% (< 9% smokers).    Low dose chest CT on 03/16/2017 was lung-RADS 2, benign appearance or behavior.  There was left main and two-vessel coronary atherosclerosis.  There was a 4.2 cm  ascending thoracic aortic aneurysm with recommendation for annual imaging followup by CTA or MRA.  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 Suspected dominant 4.5 cm inferior left thyroid lobe nodule.  There was a small to moderate hiatal hernia.  Low dose chest CT on 03/20/2018 revealed benign appearance or behavior. Lung-RADS score was 2.  Thyroid ultrasound on 04/03/2017 revealed 7 nodules.  Left mid thyroid nodule (labeled 4) met criteria for biopsy.  The left inferior thyroid nodule (labeled 7) would meet criteria for biopsy, however, was favored to be a pseudo nodule and extension of thyroid goiter into the mediastinum given the appearance on prior CT.  Left thyroid nodules (labeled 5 and 6) both met criteria for surveillance. None of the right-sided nodules met criteria for surveillance or biopsy.  FNA thyroid biopsy x 2 on 04/20/2017 of the left mid thyroid nodule and left inferior thyroid nodule were benign (Bethesda category II) and c/w benign follicular nodule.    She began a phlebotomy program on 02/08/2017 (last 04/16/2018).  She undergoes small volume phlebotomy (300 cc) if her hematocrit is > 47.  Symptomatically, she is doing well.  She denies any concerns.  Exam is unremarkable.  Plan: 1.  Labs today:  CBC with  diff. 2.  Secondary erythrocutosis             Hematocrit is 51.0.             She is smoking 5-10 cigarettes/day.  Encourage smoking cessation.  Small volume phlebotomy today. 3.  Thyroid nodules             Review thyroid ultrasound from 04/03/2017.  Schedule follow-up thyroid ultrasound. 4.   RTC monthly for CBC +/- phlebotomy. 5.   RTC in 4 months for  MD assessment, labs (CBC with diff), and +/- phlebotomy.  I discussed the assessment and treatment plan with the patient.  The patient was provided an opportunity to ask questions and all were answered.  The patient agreed with the plan and demonstrated an understanding of the instructions.  The patient was advised to call back if the symptoms worsen or if the condition fails to improve as anticipated.  I provided 15 minutes of face-to-face time during this this encounter and > 50% was spent counseling as documented under my assessment and plan.    Lequita Asal, MD, PhD    07/11/2018, 1:11 PM  I, Molly Dorshimer, am acting as Education administrator for [employer]. Mike Gip, MD, PhD.  I, Emalynn Clewis C. Mike Gip, MD, have reviewed the above documentation for accuracy and completeness, and I agree with the above.

## 2018-08-09 ENCOUNTER — Other Ambulatory Visit: Payer: Self-pay

## 2018-08-10 ENCOUNTER — Inpatient Hospital Stay: Payer: Medicare HMO | Attending: Hematology and Oncology

## 2018-08-10 ENCOUNTER — Inpatient Hospital Stay: Payer: Medicare HMO

## 2018-08-10 VITALS — BP 116/75 | HR 90 | Temp 97.0°F | Resp 16

## 2018-08-10 DIAGNOSIS — D751 Secondary polycythemia: Secondary | ICD-10-CM

## 2018-08-10 DIAGNOSIS — E041 Nontoxic single thyroid nodule: Secondary | ICD-10-CM

## 2018-08-10 LAB — CBC
HCT: 50 % — ABNORMAL HIGH (ref 36.0–46.0)
Hemoglobin: 15.8 g/dL — ABNORMAL HIGH (ref 12.0–15.0)
MCH: 29.1 pg (ref 26.0–34.0)
MCHC: 31.6 g/dL (ref 30.0–36.0)
MCV: 92.1 fL (ref 80.0–100.0)
Platelets: 179 10*3/uL (ref 150–400)
RBC: 5.43 MIL/uL — ABNORMAL HIGH (ref 3.87–5.11)
RDW: 16.7 % — ABNORMAL HIGH (ref 11.5–15.5)
WBC: 9.3 10*3/uL (ref 4.0–10.5)
nRBC: 0 % (ref 0.0–0.2)

## 2018-08-10 NOTE — Patient Instructions (Signed)

## 2018-08-10 NOTE — Progress Notes (Signed)
Parameters for Phlebotomy are 381ml if HCT > 47 or Hgb > 16; HCT is 50 today so phlebotomy will be performed.

## 2018-09-10 ENCOUNTER — Ambulatory Visit
Admission: RE | Admit: 2018-09-10 | Discharge: 2018-09-10 | Disposition: A | Discharge status: Home / Self Care | Payer: Medicare HMO | Source: Ambulatory Visit | Attending: Hematology and Oncology | Admitting: Hematology and Oncology

## 2018-09-10 ENCOUNTER — Other Ambulatory Visit: Payer: Self-pay

## 2018-09-10 DIAGNOSIS — E041 Nontoxic single thyroid nodule: Secondary | ICD-10-CM | POA: Insufficient documentation

## 2018-09-11 ENCOUNTER — Inpatient Hospital Stay: Payer: Medicare HMO | Attending: Hematology and Oncology

## 2018-09-11 ENCOUNTER — Inpatient Hospital Stay: Payer: Medicare HMO

## 2018-09-11 DIAGNOSIS — E042 Nontoxic multinodular goiter: Secondary | ICD-10-CM | POA: Diagnosis not present

## 2018-09-11 DIAGNOSIS — M25569 Pain in unspecified knee: Secondary | ICD-10-CM | POA: Diagnosis not present

## 2018-09-11 DIAGNOSIS — G8929 Other chronic pain: Secondary | ICD-10-CM | POA: Insufficient documentation

## 2018-09-11 DIAGNOSIS — D751 Secondary polycythemia: Secondary | ICD-10-CM | POA: Insufficient documentation

## 2018-09-11 DIAGNOSIS — E041 Nontoxic single thyroid nodule: Secondary | ICD-10-CM

## 2018-09-11 LAB — CBC
HCT: 46.2 % — ABNORMAL HIGH (ref 36.0–46.0)
Hemoglobin: 14.7 g/dL (ref 12.0–15.0)
MCH: 29.2 pg (ref 26.0–34.0)
MCHC: 31.8 g/dL (ref 30.0–36.0)
MCV: 91.7 fL (ref 80.0–100.0)
Platelets: 153 10*3/uL (ref 150–400)
RBC: 5.04 MIL/uL (ref 3.87–5.11)
RDW: 15.6 % — ABNORMAL HIGH (ref 11.5–15.5)
WBC: 7.7 10*3/uL (ref 4.0–10.5)
nRBC: 0 % (ref 0.0–0.2)

## 2018-09-11 NOTE — Progress Notes (Signed)
Lab results show no phlebotomy needed today.

## 2018-10-11 ENCOUNTER — Other Ambulatory Visit: Payer: Self-pay

## 2018-10-12 ENCOUNTER — Inpatient Hospital Stay: Payer: Medicare HMO | Attending: Hematology and Oncology

## 2018-10-12 ENCOUNTER — Inpatient Hospital Stay: Payer: Medicare HMO

## 2018-10-12 VITALS — BP 130/79 | HR 94 | Temp 97.6°F | Resp 18

## 2018-10-12 DIAGNOSIS — E041 Nontoxic single thyroid nodule: Secondary | ICD-10-CM

## 2018-10-12 DIAGNOSIS — F17210 Nicotine dependence, cigarettes, uncomplicated: Secondary | ICD-10-CM | POA: Diagnosis not present

## 2018-10-12 DIAGNOSIS — D751 Secondary polycythemia: Secondary | ICD-10-CM

## 2018-10-12 LAB — CBC
HCT: 50.3 % — ABNORMAL HIGH (ref 36.0–46.0)
Hemoglobin: 16.3 g/dL — ABNORMAL HIGH (ref 12.0–15.0)
MCH: 29.6 pg (ref 26.0–34.0)
MCHC: 32.4 g/dL (ref 30.0–36.0)
MCV: 91.5 fL (ref 80.0–100.0)
Platelets: 174 10*3/uL (ref 150–400)
RBC: 5.5 MIL/uL — ABNORMAL HIGH (ref 3.87–5.11)
RDW: 15.2 % (ref 11.5–15.5)
WBC: 10.4 10*3/uL (ref 4.0–10.5)
nRBC: 0 % (ref 0.0–0.2)

## 2018-10-12 NOTE — Patient Instructions (Signed)

## 2018-10-12 NOTE — Progress Notes (Signed)
Tolerated phlebotomy well. Denies any dizziness. VSS. Ambulated out of clinic. No complaints.

## 2018-11-20 NOTE — Progress Notes (Signed)
[facility]  [address] [place], [place] [address] Phone: [phone]  Fax: [phone]   Clinic Day:  11/22/2018  Referring physician: Ellamae Sia, MD  Chief Complaint: Alicia Holder is a 68 y.o. 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  HPI: The patient was last seen in the hematology clinic on 07/11/2018. At that time, she was doing well. She denieed any concerns. Exam was unremarkable. Hematocrit was 51.0. Follow-up thyroid ultrasound was scheduled.  She underwent phlebotomy.  Head and neck soft tissue ultrasound on 09/10/2018 revealed thyromegaly with bilateral nodules, stable. None met criteria for biopsy. Recommended annual/biennial ultrasound follow-up of nodules as above, until stability x 5 years confirmed.   Labs followed: 08/10/2018: Hematocrit 50.0, hemoglobin 15.8, MCV 92.1, platelets 179,000, WBC 9,300. 09/11/2018: Hematocrit 46.2, hemoglobin 14.7, MCV 91.7, platelets 153,000, WBC 7,700. 10/12/2018: Hematocrit 50.3, hemoglobin 16.3, MCV 91.5, platelets 174,000, WBC 10,400.   She underwent phlebotomy (300 ml) on 08/10/2018 and 10/12/2018.  During the interim, she has felt better. Her weight is down 19 pounds. She changed her diet and is eating healthy with portion control. Her weight loss is intentional and is now leveling off. Her goal weight is 145 pounds. She has right knee pain. She is smoking 7 cigarettes a day.   Past Medical History:  Diagnosis Date   Colon cancer [facility])     Past Surgical History:  Procedure Laterality Date   ABDOMINAL HYSTERECTOMY     APPENDECTOMY      History reviewed. No pertinent family history.  Social History:  reports that she has been smoking. She has a 41.00 pack-year smoking history. She has never used smokeless tobacco. She reports current alcohol use. No history on file for drug. She states that she smokes 3 ppd x 20 years. She now smokes 7  cigarettes a day. She drinks 1-2 alcoholic drinks/month. 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 Patient denies known exposures to radiation or toxins. She lives with her mentally challenged daughter, Alicia Holder. Her other daughter, Alicia Holder, helps out. Her first cousin is Alicia Holder, Therapist, music). She has 8 great grandchildren. Patient does not drive. She lives in a trailer in [place] that is near her daughter, Alicia Holder. She has several grandchildren. She has a GED and geriatric nursing assistant degree. She has stayed safe during the pandemic, and is taking appropriate precautions. The patient is alone today.  Allergies: No Known Allergies  Current Medications: Current Outpatient Medications  Medication Sig Dispense Refill   ADVAIR DISKUS 250-50 MCG/DOSE AEPB Inhale 1 puff into the lungs 2 (two) times daily.     alendronate (FOSAMAX) 70 MG tablet Take 70 mg by mouth once a week.     docusate sodium (COLACE) 100 MG capsule Take 100 mg by mouth daily.     lisinopril (PRINIVIL,ZESTRIL) 10 MG tablet Take 20 mg by mouth daily.      montelukast (SINGULAIR) 10 MG tablet Take 10 mg by mouth at bedtime.     oxyCODONE (OXY IR/ROXICODONE) 5 MG immediate release tablet Take 5 mg by mouth 3 (three) times daily.     sertraline (ZOLOFT) 50 MG tablet Take 50 mg by mouth daily.     simvastatin (ZOCOR) 20 MG tablet Take 20 mg by mouth daily.     albuterol (VENTOLIN HFA) 108 (90 Base) MCG/ACT inhaler Inhale 2 puffs into the lungs every 4 (four) hours as needed.  No current facility-administered medications for this visit.     Review of Systems  Constitutional: Positive for weight loss (19 pounds; intentional; goal weight 145 lbs.). Negative for chills, diaphoresis, fever and malaise/fatigue.       Feels better.  HENT: Negative.  Negative for congestion, ear pain, hearing loss, nosebleeds, sinus pain and sore throat.   Eyes: Negative.  Negative  for blurred vision and double vision.  Respiratory: Negative.  Negative for cough, hemoptysis and shortness of breath.   Cardiovascular: Negative.  Negative for chest pain, palpitations, orthopnea and PND.  Gastrointestinal: Negative.  Negative for abdominal pain, blood in stool, constipation, diarrhea, melena, nausea and vomiting.       Eating well.  Genitourinary: Negative.  Negative for dysuria, frequency, hematuria and urgency.  Musculoskeletal: Positive for joint pain (chronic right kneee pain). Negative for back pain and myalgias.  Skin: Negative.  Negative for rash.  Neurological: Negative.  Negative for dizziness, tingling, sensory change, focal weakness, weakness and headaches.  Endo/Heme/Allergies: Negative.  Does not bruise/bleed easily.  Psychiatric/Behavioral: Negative.  Negative for depression and memory loss. The patient is not nervous/anxious and does not have insomnia.   All other systems reviewed and are negative.  Performance status (ECOG): 1  Vitals Blood pressure (!) 145/94, pulse 85, temperature (!) 96.6 F (35.9 C), temperature source Tympanic, resp. rate 18, height _0  (1.626 m), weight 157 lb 15.4 oz (71.6 kg), SpO2 93 %.  Physical Exam  Constitutional: She is oriented to person, place, and time. She appears well-developed and well-nourished. No distress.  HENT:  Head: Normocephalic and atraumatic.  Mouth/Throat: Oropharynx is clear and moist. No oropharyngeal exudate.  Brown styled hair. Edentulous.  Mask.  Eyes: Pupils are equal, round, and reactive to light. Conjunctivae and EOM are normal. No scleral icterus.  Blue eyes.  Neck: Normal range of motion. Neck supple. No JVD present.  Cardiovascular: Normal rate, regular rhythm and normal heart sounds.  No murmur heard. Pulmonary/Chest: Effort normal. No respiratory distress. She has wheezes (soft). She has no rales.  Abdominal: Soft. Bowel sounds are normal. She exhibits no distension and no mass. There is  no hepatosplenomegaly. There is no abdominal tenderness. There is no rebound and no guarding.  Musculoskeletal: Normal range of motion.        General: No edema.  Lymphadenopathy:    She has no cervical adenopathy.    She has no axillary adenopathy.       Right: No supraclavicular adenopathy present.       Left: No supraclavicular adenopathy present.  Neurological: She is alert and oriented to person, place, and time.  Skin: Skin is warm and dry. No rash noted. She is not diaphoretic. No erythema. No pallor.  Nicotine stained nails.  Psychiatric: She has a normal mood and affect. Her behavior is normal. Judgment and thought content normal.  Nursing note and vitals reviewed.   Appointment on 11/22/2018  Component Date Value Ref Range Status   WBC 11/22/2018 7.5  4.0 - 10.5 K/uL Final   RBC 11/22/2018 5.17* 3.87 - 5.11 MIL/uL Final   Hemoglobin 11/22/2018 14.7  12.0 - 15.0 g/dL Final   HCT 11/22/2018 46.3* 36.0 - 46.0 % Final   MCV 11/22/2018 89.6  80.0 - 100.0 fL Final   MCH 11/22/2018 28.4  26.0 - 34.0 pg Final   MCHC 11/22/2018 31.7  30.0 - 36.0 g/dL Final   RDW 11/22/2018 16.2* 11.5 - 15.5 % Final   Platelets 11/22/2018 199  150 - 400 K/uL Final   nRBC 11/22/2018 0.0  0.0 - 0.2 % Final   Neutrophils Relative % 11/22/2018 59  % Final   Neutro Abs 11/22/2018 4.4  1.7 - 7.7 K/uL Final   Lymphocytes Relative 11/22/2018 29  % Final   Lymphs Abs 11/22/2018 2.2  0.7 - 4.0 K/uL Final   Monocytes Relative 11/22/2018 8  % Final   Monocytes Absolute 11/22/2018 0.6  0.1 - 1.0 K/uL Final   Eosinophils Relative 11/22/2018 3  % Final   Eosinophils Absolute 11/22/2018 0.2  0.0 - 0.5 K/uL Final   Basophils Relative 11/22/2018 1  % Final   Basophils Absolute 11/22/2018 0.1  0.0 - 0.1 K/uL Final   Immature Granulocytes 11/22/2018 0  % Final   Abs Immature Granulocytes 11/22/2018 0.02  0.00 - 0.07 K/uL Final   Performed at [facility], [address], [place], [place] [address]    Assessment:  SHEBA WHALING is a 68 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 She has a 60+ pack year smoking history. She denies sleep apnea.   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 Last colonoscopywas 10/2012 (due in 2019 per patient). She has lost 120 pounds in 3 years with portion control and food choices.  Work-upon 01/04/2017 revealed a hematocrit of 50.8, hemoglobin 17, MCV 94.7, platelets 173,000, white count 7400 with an ANC of 5100. Differential was normal. Normal labs included CMP, epo level (10.7), ferritin (27), with an iron saturation (6%), JAK2 V617F and exons 12-15 were negative. BCR-ABL testing was negative for e1a2 (p190), e13a2 (b2a2, p210) and e14a2 (b3a2, p210) fusion transcripts. Carbon monoxide levelwas 17% (<9% smokers).   Low dose chest CT on 03/16/2017 was lung-RADS 2, benign appearance or behavior. There was left main and two-vessel coronary atherosclerosis. There was a 4.2 cm ascending thoracic aortic aneurysmwith recommendation for annual imaging followup by CTA or MRA. 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 There was a multinodular goiter. Suspected dominant 4.5 cm inferior left thyroid lobe nodule.  There was a small to moderate hiatal hernia.  Low dose chest CT on 03/20/2018 revealed benign appearance or behavior. Lung-RADS score was 2.  Thyroid ultrasound on 04/03/2017 revealed 7 nodules. Left mid thyroid nodule (labeled 4) met criteria for biopsy. The left inferior thyroid nodule (labeled 7) would meet criteria for biopsy, however, was favored to be a pseudo nodule and extension of thyroid goiter into the mediastinum given the appearance on prior CT. Left thyroid nodules (labeled 5 and 6) both met criteria for surveillance. None of the right-sided nodules met criteria for surveillance or biopsy.  Thyroid ultrasound on 09/10/2018 revealed thyromegaly with bilateral nodules, stable. None met  criteria for biopsy. Recommended annual/biennial ultrasound follow-up   FNA thyroid biopsy x 2 on 04/20/2017 of the left mid thyroid nodule and left inferior thyroid nodule were benign (Bethesda category II) and c/w benign follicular nodule.   She began a phlebotomy programon 02/08/2017 (last 10/12/2018). She undergoes small volume phlebotomy (300 cc)if her hematocrit is >47.  Symptomatically, she feels better.  Weight is down 19 pounds intentionally.  Exam reveals soft wheeze.  Plan: 1.  Labs today: CBC with diff. 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 She is smoking 7 cigarettes/day.             Encourage smoking cessation. 3. Thyroid nodules Thyroid ultrasound on 09/10/2018 reviewed.  Bilateral thyroid nodules are stable.  Continue to monitor. 4.   Weight loss  Patient notes intentional.  Goal weight 145 pounds.  Continue to monitor closely. 5.   Health maintenance  GI referral.  Colonoscopy was due in 2019. 6.   RTC monthly for CBC +/- phlebotomy. 7.   RTC in 3 months for MD assessment, labs (CBC with diff, CMP, chromogranin, TSH) and +/- phlebotomy.   I discussed the assessment and treatment plan with the patient.  The patient was provided an opportunity to ask questions and all were answered.  The patient agreed with the plan and demonstrated an understanding of the instructions.  The patient was advised to call back if the symptoms worsen or if the condition fails to improve as anticipated.  I provided 15 minutes of face-to-face time during this this encounter and > 50% was spent counseling as documented under my assessment and plan.    Lequita Asal, MD, PhD    11/22/2018, 2:32 PM  I, Selena Batten, am acting as scribe for [employer]. Mike Gip, MD, PhD.  I, Linnell Swords C. Mike Gip, MD, have reviewed the above documentation for accuracy and completeness, and I agree with the above.

## 2018-11-21 ENCOUNTER — Encounter: Payer: Self-pay | Admitting: Hematology and Oncology

## 2018-11-21 NOTE — Progress Notes (Signed)
The patient c/o right knee pain ( today pain level 3). The patient name and DOB has been verified by phone today.

## 2018-11-22 ENCOUNTER — Encounter: Payer: Self-pay | Admitting: Hematology and Oncology

## 2018-11-22 ENCOUNTER — Inpatient Hospital Stay (HOSPITAL_BASED_OUTPATIENT_CLINIC_OR_DEPARTMENT_OTHER): Payer: Medicare HMO | Admitting: Hematology and Oncology

## 2018-11-22 ENCOUNTER — Inpatient Hospital Stay: Payer: Medicare HMO | Attending: Hematology and Oncology

## 2018-11-22 ENCOUNTER — Inpatient Hospital Stay: Payer: Medicare HMO

## 2018-11-22 ENCOUNTER — Other Ambulatory Visit: Payer: Self-pay

## 2018-11-22 VITALS — BP 145/94 | HR 85 | Temp 96.6°F | Resp 18 | Ht 64.0 in | Wt 158.0 lb

## 2018-11-22 DIAGNOSIS — Z85038 Personal history of other malignant neoplasm of large intestine: Secondary | ICD-10-CM | POA: Insufficient documentation

## 2018-11-22 DIAGNOSIS — D751 Secondary polycythemia: Secondary | ICD-10-CM

## 2018-11-22 DIAGNOSIS — E042 Nontoxic multinodular goiter: Secondary | ICD-10-CM | POA: Insufficient documentation

## 2018-11-22 DIAGNOSIS — Z90710 Acquired absence of both cervix and uterus: Secondary | ICD-10-CM | POA: Insufficient documentation

## 2018-11-22 DIAGNOSIS — Z7951 Long term (current) use of inhaled steroids: Secondary | ICD-10-CM | POA: Insufficient documentation

## 2018-11-22 DIAGNOSIS — F17210 Nicotine dependence, cigarettes, uncomplicated: Secondary | ICD-10-CM | POA: Diagnosis not present

## 2018-11-22 DIAGNOSIS — R634 Abnormal weight loss: Secondary | ICD-10-CM | POA: Diagnosis not present

## 2018-11-22 DIAGNOSIS — E041 Nontoxic single thyroid nodule: Secondary | ICD-10-CM

## 2018-11-22 DIAGNOSIS — Z79899 Other long term (current) drug therapy: Secondary | ICD-10-CM | POA: Insufficient documentation

## 2018-11-22 DIAGNOSIS — Z0000 Encounter for general adult medical examination without abnormal findings: Secondary | ICD-10-CM

## 2018-11-22 LAB — CBC WITH DIFFERENTIAL/PLATELET
Abs Immature Granulocytes: 0.02 10*3/uL (ref 0.00–0.07)
Basophils Absolute: 0.1 10*3/uL (ref 0.0–0.1)
Basophils Relative: 1 %
Eosinophils Absolute: 0.2 10*3/uL (ref 0.0–0.5)
Eosinophils Relative: 3 %
HCT: 46.3 % — ABNORMAL HIGH (ref 36.0–46.0)
Hemoglobin: 14.7 g/dL (ref 12.0–15.0)
Immature Granulocytes: 0 %
Lymphocytes Relative: 29 %
Lymphs Abs: 2.2 10*3/uL (ref 0.7–4.0)
MCH: 28.4 pg (ref 26.0–34.0)
MCHC: 31.7 g/dL (ref 30.0–36.0)
MCV: 89.6 fL (ref 80.0–100.0)
Monocytes Absolute: 0.6 10*3/uL (ref 0.1–1.0)
Monocytes Relative: 8 %
Neutro Abs: 4.4 10*3/uL (ref 1.7–7.7)
Neutrophils Relative %: 59 %
Platelets: 199 10*3/uL (ref 150–400)
RBC: 5.17 MIL/uL — ABNORMAL HIGH (ref 3.87–5.11)
RDW: 16.2 % — ABNORMAL HIGH (ref 11.5–15.5)
WBC: 7.5 10*3/uL (ref 4.0–10.5)
nRBC: 0 % (ref 0.0–0.2)

## 2018-12-20 ENCOUNTER — Other Ambulatory Visit: Payer: Self-pay

## 2018-12-20 ENCOUNTER — Inpatient Hospital Stay: Payer: Medicare HMO

## 2018-12-20 ENCOUNTER — Inpatient Hospital Stay: Payer: Medicare HMO | Attending: Hematology and Oncology

## 2018-12-20 DIAGNOSIS — D751 Secondary polycythemia: Secondary | ICD-10-CM | POA: Insufficient documentation

## 2018-12-20 DIAGNOSIS — F17210 Nicotine dependence, cigarettes, uncomplicated: Secondary | ICD-10-CM | POA: Insufficient documentation

## 2018-12-20 DIAGNOSIS — E041 Nontoxic single thyroid nodule: Secondary | ICD-10-CM

## 2018-12-20 LAB — CBC
HCT: 44.5 % (ref 36.0–46.0)
Hemoglobin: 14.1 g/dL (ref 12.0–15.0)
MCH: 28.3 pg (ref 26.0–34.0)
MCHC: 31.7 g/dL (ref 30.0–36.0)
MCV: 89.2 fL (ref 80.0–100.0)
Platelets: 212 10*3/uL (ref 150–400)
RBC: 4.99 MIL/uL (ref 3.87–5.11)
RDW: 18.1 % — ABNORMAL HIGH (ref 11.5–15.5)
WBC: 9.8 10*3/uL (ref 4.0–10.5)
nRBC: 0 % (ref 0.0–0.2)

## 2018-12-20 NOTE — Progress Notes (Signed)
Patient does not need phlebotomy today. She states she has gone from 3 regular packs of cigarettes per day to 1/2 pack of ultra light cigarettes per day. I told her to keep up the good work .

## 2019-01-01 ENCOUNTER — Emergency Department: Payer: Medicare HMO

## 2019-01-01 ENCOUNTER — Encounter: Payer: Self-pay | Admitting: Emergency Medicine

## 2019-01-01 ENCOUNTER — Other Ambulatory Visit: Payer: Self-pay

## 2019-01-01 ENCOUNTER — Observation Stay: Payer: Medicare HMO

## 2019-01-01 ENCOUNTER — Inpatient Hospital Stay
Admission: EM | Admit: 2019-01-01 | Discharge: 2019-01-05 | DRG: 190 | Disposition: A | Discharge status: Home / Self Care | Payer: Medicare HMO | Attending: Internal Medicine | Admitting: Internal Medicine

## 2019-01-01 DIAGNOSIS — J9601 Acute respiratory failure with hypoxia: Secondary | ICD-10-CM | POA: Diagnosis not present

## 2019-01-01 DIAGNOSIS — Z90710 Acquired absence of both cervix and uterus: Secondary | ICD-10-CM

## 2019-01-01 DIAGNOSIS — Z85038 Personal history of other malignant neoplasm of large intestine: Secondary | ICD-10-CM

## 2019-01-01 DIAGNOSIS — E785 Hyperlipidemia, unspecified: Secondary | ICD-10-CM

## 2019-01-01 DIAGNOSIS — M810 Age-related osteoporosis without current pathological fracture: Secondary | ICD-10-CM | POA: Diagnosis present

## 2019-01-01 DIAGNOSIS — J441 Chronic obstructive pulmonary disease with (acute) exacerbation: Secondary | ICD-10-CM | POA: Diagnosis not present

## 2019-01-01 DIAGNOSIS — Z7951 Long term (current) use of inhaled steroids: Secondary | ICD-10-CM

## 2019-01-01 DIAGNOSIS — J439 Emphysema, unspecified: Principal | ICD-10-CM | POA: Diagnosis present

## 2019-01-01 DIAGNOSIS — F17210 Nicotine dependence, cigarettes, uncomplicated: Secondary | ICD-10-CM | POA: Diagnosis present

## 2019-01-01 DIAGNOSIS — Z716 Tobacco abuse counseling: Secondary | ICD-10-CM

## 2019-01-01 DIAGNOSIS — F419 Anxiety disorder, unspecified: Secondary | ICD-10-CM | POA: Diagnosis present

## 2019-01-01 DIAGNOSIS — Z79899 Other long term (current) drug therapy: Secondary | ICD-10-CM

## 2019-01-01 DIAGNOSIS — D751 Secondary polycythemia: Secondary | ICD-10-CM | POA: Diagnosis present

## 2019-01-01 DIAGNOSIS — Z66 Do not resuscitate: Secondary | ICD-10-CM | POA: Diagnosis not present

## 2019-01-01 DIAGNOSIS — J301 Allergic rhinitis due to pollen: Secondary | ICD-10-CM | POA: Diagnosis present

## 2019-01-01 DIAGNOSIS — Z8249 Family history of ischemic heart disease and other diseases of the circulatory system: Secondary | ICD-10-CM

## 2019-01-01 DIAGNOSIS — I10 Essential (primary) hypertension: Secondary | ICD-10-CM

## 2019-01-01 DIAGNOSIS — R0600 Dyspnea, unspecified: Secondary | ICD-10-CM | POA: Diagnosis not present

## 2019-01-01 DIAGNOSIS — I714 Abdominal aortic aneurysm, without rupture: Secondary | ICD-10-CM | POA: Diagnosis present

## 2019-01-01 DIAGNOSIS — Z20828 Contact with and (suspected) exposure to other viral communicable diseases: Secondary | ICD-10-CM | POA: Diagnosis present

## 2019-01-01 HISTORY — DX: Abdominal aortic aneurysm, without rupture: I71.4

## 2019-01-01 HISTORY — DX: Abdominal aortic aneurysm, without rupture, unspecified: I71.40

## 2019-01-01 LAB — CBC WITH DIFFERENTIAL/PLATELET
Abs Immature Granulocytes: 0.03 10*3/uL (ref 0.00–0.07)
Basophils Absolute: 0.1 10*3/uL (ref 0.0–0.1)
Basophils Relative: 1 %
Eosinophils Absolute: 0.2 10*3/uL (ref 0.0–0.5)
Eosinophils Relative: 2 %
HCT: 44.8 % (ref 36.0–46.0)
Hemoglobin: 14.4 g/dL (ref 12.0–15.0)
Immature Granulocytes: 0 %
Lymphocytes Relative: 15 %
Lymphs Abs: 1.3 10*3/uL (ref 0.7–4.0)
MCH: 28.2 pg (ref 26.0–34.0)
MCHC: 32.1 g/dL (ref 30.0–36.0)
MCV: 87.7 fL (ref 80.0–100.0)
Monocytes Absolute: 0.7 10*3/uL (ref 0.1–1.0)
Monocytes Relative: 7 %
Neutro Abs: 6.9 10*3/uL (ref 1.7–7.7)
Neutrophils Relative %: 75 %
Platelets: 170 10*3/uL (ref 150–400)
RBC: 5.11 MIL/uL (ref 3.87–5.11)
RDW: 16.7 % — ABNORMAL HIGH (ref 11.5–15.5)
WBC: 9.2 10*3/uL (ref 4.0–10.5)
nRBC: 0 % (ref 0.0–0.2)

## 2019-01-01 LAB — COMPREHENSIVE METABOLIC PANEL
ALT: 10 U/L (ref 0–44)
AST: 14 U/L — ABNORMAL LOW (ref 15–41)
Albumin: 3.5 g/dL (ref 3.5–5.0)
Alkaline Phosphatase: 48 U/L (ref 38–126)
Anion gap: 9 (ref 5–15)
BUN: <5 mg/dL — ABNORMAL LOW (ref 8–23)
CO2: 28 mmol/L (ref 22–32)
Calcium: 8.9 mg/dL (ref 8.9–10.3)
Chloride: 98 mmol/L (ref 98–111)
Creatinine, Ser: 0.53 mg/dL (ref 0.44–1.00)
GFR calc Af Amer: >60 mL/min (ref >60)
GFR calc non Af Amer: >60 mL/min (ref >60)
Glucose, Bld: 104 mg/dL — ABNORMAL HIGH (ref 70–99)
Potassium: 4.5 mmol/L (ref 3.5–5.1)
Sodium: 135 mmol/L (ref 135–145)
Total Bilirubin: 0.6 mg/dL (ref 0.3–1.2)
Total Protein: 7.3 g/dL (ref 6.5–8.1)

## 2019-01-01 LAB — POC SARS CORONAVIRUS 2 AG: SARS Coronavirus 2 Ag: NEGATIVE

## 2019-01-01 LAB — TROPONIN I (HIGH SENSITIVITY)
Troponin I (High Sensitivity): 4 ng/L (ref <18)
Troponin I (High Sensitivity): 4 ng/L (ref <18)

## 2019-01-01 LAB — APTT: aPTT: 30 seconds (ref 24–36)

## 2019-01-01 LAB — PROTIME-INR
INR: 1.1 (ref 0.8–1.2)
Prothrombin Time: 13.9 seconds (ref 11.4–15.2)

## 2019-01-01 LAB — TYPE AND SCREEN
ABO/RH(D): A POS
Antibody Screen: NEGATIVE

## 2019-01-01 LAB — FIBRIN DERIVATIVES D-DIMER (ARMC ONLY): Fibrin derivatives D-dimer (ARMC): 1060.45 ng/mL (FEU) — ABNORMAL HIGH (ref 0.00–499.00)

## 2019-01-01 MED ORDER — SODIUM CHLORIDE 0.9 % IV SOLN: 250.0000 mL | INTRAVENOUS | Status: DC | PRN

## 2019-01-01 MED ORDER — IPRATROPIUM-ALBUTEROL 0.5-2.5 (3) MG/3ML IN SOLN
3.0000 mL | Freq: Once | RESPIRATORY_TRACT | Status: AC
  Administered 2019-01-01: 19:00:00 3 mL via RESPIRATORY_TRACT
  Filled 2019-01-01: qty 3

## 2019-01-01 MED ORDER — SODIUM CHLORIDE 0.9 % IV SOLN
500.0000 mg | INTRAVENOUS | Status: DC
  Administered 2019-01-01: 500 mg via INTRAVENOUS
  Filled 2019-01-01 (×2): qty 500

## 2019-01-01 MED ORDER — SODIUM CHLORIDE 0.9% FLUSH
3.0000 mL | Freq: Two times a day (BID) | INTRAVENOUS | Status: DC
  Administered 2019-01-01 – 2019-01-05 (×8): 3 mL via INTRAVENOUS

## 2019-01-01 MED ORDER — ACETAMINOPHEN 650 MG RE SUPP: 650.0000 mg | Freq: Four times a day (QID) | RECTAL | Status: DC | PRN

## 2019-01-01 MED ORDER — ALBUTEROL SULFATE HFA 108 (90 BASE) MCG/ACT IN AERS
1.0000 | INHALATION_SPRAY | RESPIRATORY_TRACT | Status: DC | PRN
  Administered 2019-01-02 – 2019-01-03 (×2): 2 via RESPIRATORY_TRACT
  Filled 2019-01-01: qty 6.7

## 2019-01-01 MED ORDER — METHYLPREDNISOLONE SODIUM SUCC 125 MG IJ SOLR
125.0000 mg | Freq: Once | INTRAMUSCULAR | Status: AC
  Administered 2019-01-01: 19:00:00 125 mg via INTRAVENOUS
  Filled 2019-01-01: qty 2

## 2019-01-01 MED ORDER — MOMETASONE FURO-FORMOTEROL FUM 200-5 MCG/ACT IN AERO
2.0000 | INHALATION_SPRAY | Freq: Two times a day (BID) | RESPIRATORY_TRACT | Status: DC
  Administered 2019-01-02 – 2019-01-04 (×7): 2 via RESPIRATORY_TRACT
  Filled 2019-01-01: qty 8.8

## 2019-01-01 MED ORDER — IPRATROPIUM-ALBUTEROL 0.5-2.5 (3) MG/3ML IN SOLN
3.0000 mL | Freq: Once | RESPIRATORY_TRACT | Status: AC
  Administered 2019-01-01: 21:00:00 3 mL via RESPIRATORY_TRACT
  Filled 2019-01-01: qty 3

## 2019-01-01 MED ORDER — SIMVASTATIN 20 MG PO TABS
20.0000 mg | ORAL_TABLET | Freq: Every day | ORAL | Status: DC
  Administered 2019-01-01 – 2019-01-05 (×5): 20 mg via ORAL
  Filled 2019-01-01: qty 1
  Filled 2019-01-01: qty 2
  Filled 2019-01-01 (×3): qty 1

## 2019-01-01 MED ORDER — LISINOPRIL 20 MG PO TABS
20.0000 mg | ORAL_TABLET | Freq: Every day | ORAL | Status: DC
  Administered 2019-01-02 – 2019-01-05 (×4): 20 mg via ORAL
  Filled 2019-01-01 (×3): qty 1
  Filled 2019-01-01 (×2): qty 2

## 2019-01-01 MED ORDER — ALENDRONATE SODIUM 70 MG PO TABS: 70.0000 mg | ORAL_TABLET | ORAL | Status: DC

## 2019-01-01 MED ORDER — METHYLPREDNISOLONE SODIUM SUCC 125 MG IJ SOLR
80.0000 mg | Freq: Three times a day (TID) | INTRAMUSCULAR | Status: DC
  Administered 2019-01-02 – 2019-01-04 (×8): 80 mg via INTRAVENOUS
  Filled 2019-01-01: qty 1.28
  Filled 2019-01-01 (×3): qty 2
  Filled 2019-01-01: qty 1.28
  Filled 2019-01-01: qty 2
  Filled 2019-01-01: qty 1.28
  Filled 2019-01-01 (×2): qty 2

## 2019-01-01 MED ORDER — IOHEXOL 350 MG/ML SOLN
75.0000 mL | Freq: Once | INTRAVENOUS | Status: AC | PRN
  Administered 2019-01-01: 75 mL via INTRAVENOUS

## 2019-01-01 MED ORDER — SODIUM CHLORIDE 0.9% FLUSH: 3.0000 mL | INTRAVENOUS | Status: DC | PRN

## 2019-01-01 MED ORDER — DOCUSATE SODIUM 100 MG PO CAPS
100.0000 mg | ORAL_CAPSULE | Freq: Every day | ORAL | Status: DC
  Administered 2019-01-01 – 2019-01-05 (×5): 100 mg via ORAL
  Filled 2019-01-01 (×5): qty 1

## 2019-01-01 MED ORDER — OXYCODONE HCL 5 MG PO TABS: 5.0000 mg | ORAL_TABLET | Freq: Three times a day (TID) | ORAL | Status: DC | PRN

## 2019-01-01 MED ORDER — METHYLPREDNISOLONE SODIUM SUCC 125 MG IJ SOLR: 80.0000 mg | Freq: Three times a day (TID) | INTRAMUSCULAR | Status: DC

## 2019-01-01 MED ORDER — SERTRALINE HCL 50 MG PO TABS
50.0000 mg | ORAL_TABLET | Freq: Every day | ORAL | Status: DC
  Administered 2019-01-02 – 2019-01-05 (×4): 50 mg via ORAL
  Filled 2019-01-01 (×4): qty 1

## 2019-01-01 MED ORDER — TIOTROPIUM BROMIDE MONOHYDRATE 18 MCG IN CAPS
18.0000 ug | ORAL_CAPSULE | Freq: Every day | RESPIRATORY_TRACT | Status: DC
  Administered 2019-01-04: 09:00:00 18 ug via RESPIRATORY_TRACT
  Filled 2019-01-01 (×2): qty 5

## 2019-01-01 MED ORDER — ENOXAPARIN SODIUM 40 MG/0.4ML ~~LOC~~ SOLN
40.0000 mg | SUBCUTANEOUS | Status: DC
  Administered 2019-01-01 – 2019-01-04 (×4): 40 mg via SUBCUTANEOUS
  Filled 2019-01-01 (×5): qty 0.4

## 2019-01-01 MED ORDER — ACETAMINOPHEN 325 MG PO TABS
650.0000 mg | ORAL_TABLET | Freq: Four times a day (QID) | ORAL | Status: DC | PRN
  Administered 2019-01-03 – 2019-01-05 (×6): 650 mg via ORAL
  Filled 2019-01-01 (×6): qty 2

## 2019-01-01 MED ORDER — MONTELUKAST SODIUM 10 MG PO TABS
10.0000 mg | ORAL_TABLET | Freq: Every day | ORAL | Status: DC
  Administered 2019-01-01 – 2019-01-04 (×4): 10 mg via ORAL
  Filled 2019-01-01 (×5): qty 1

## 2019-01-01 NOTE — ED Notes (Signed)
BP Rt arm 141/90, BP Lt arm 136/91

## 2019-01-01 NOTE — ED Provider Notes (Signed)
[facility] Emergency Department Provider Note    First MD Initiated Contact with Patient 01/01/19 1839     (approximate)  I have reviewed the triage vital signs and the nursing notes.   HISTORY  Chief Complaint Shortness of Breath and Chest Pain    HPI Alicia Holder is a 68 y.o. female [place] past medical history presents the ER for evaluation of worsening shortness of breath chest discomfort.  Symptoms have been ongoing for about 1 week and progressively worsening.  No measured fevers.  Has had having nonproductive cough.  Does not wear home oxygen.  Does continue to smoke roughly 1 pack/day.  She is having some chest discomfort radiate through to her back.  Does have some discomfort when taking deep inspiration.  No measured fevers.  No recent antibiotics.    Past Medical History:  Diagnosis Date   AAA (abdominal aortic aneurysm) ([place])    Colon cancer ([place])    History reviewed. No pertinent family history. 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 Authorizing Provider  ADVAIR DISKUS 250-50 MCG/DOSE AEPB Inhale 1 puff into the lungs 2 (two) times daily. 11/14/16  Yes [provider]  albuterol (VENTOLIN HFA) 108 (90 Base) MCG/ACT inhaler Inhale 1-2 puffs into the lungs every 4 (four) hours as needed for wheezing or shortness of breath.  07/09/18  Yes [provider]  alendronate (FOSAMAX) 70 MG tablet Take 70 mg by mouth once a  week. 07/26/17  Yes [provider]  docusate sodium (COLACE) 100 MG capsule Take 100 mg by mouth daily.   Yes [provider]  lisinopril (ZESTRIL) 20 MG tablet Take 20 mg by mouth daily.    Yes [provider]  montelukast (SINGULAIR) 10 MG tablet Take 10 mg by mouth at bedtime. 11/14/16  Yes [provider]  oxyCODONE (OXY IR/ROXICODONE) 5 MG immediate release tablet Take 5 mg by mouth 3 (three) times daily as needed for moderate pain or breakthrough pain.    Yes [provider]  sertraline (ZOLOFT) 50 MG tablet Take 50 mg by mouth daily. 11/14/16  Yes [provider]  simvastatin (ZOCOR) 20 MG tablet Take 20 mg by mouth daily. 11/14/16  Yes [provider]    Allergies Patient has no known allergies.    Social History Social History   Tobacco Use   Smoking status: Current Some Day Smoker    Packs/day: 1.00    Years: 41.00    Pack years: 41.00   Smokeless tobacco: Never Used  Substance Use Topics   Alcohol use: Yes    Comment: Once every 3 weeks   Drug use: Not on file    Review of Systems Patient denies headaches, rhinorrhea, blurry vision, numbness, shortness of breath, chest pain, edema, cough, abdominal pain, nausea, vomiting, diarrhea, dysuria, fevers, rashes or hallucinations unless otherwise stated above in HPI. ____________________________________________   PHYSICAL EXAM:  VITAL SIGNS: Vitals:   01/01/19 1801 01/01/19 1830  BP:  132/86  Pulse:  87  Resp:  (!) 24  Temp: 99.1 F (37.3 C)   SpO2: (!) 89% 92%    Constitutional: Alert and oriented mild tachypnea, protecting her airway  Eyes: Conjunctivae are normal.  Head: Atraumatic. Nose: No congestion/rhinnorhea. Mouth/Throat: Mucous membranes are moist.   Neck: No stridor. Painless ROM.  Cardiovascular: Normal rate, regular rhythm. Grossly normal heart sounds.  Good peripheral circulation. Respiratory: tachypnea with diffuse faint  wheezing throughout, diminisshed airmovement Gastrointestinal: Soft and nontender. No distention. No abdominal bruits. No CVA tenderness. Genitourinary:  Musculoskeletal: No lower extremity tenderness nor edema.  No joint effusions. Neurologic:  Normal speech and language. No gross focal neurologic deficits are appreciated. No facial droop Skin:  Skin is warm, dry and intact. No rash noted. Psychiatric: Mood and affect are anxiousSpeech and behavior are normal.  ____________________________________________   LABS (all labs ordered are listed, but only abnormal results are displayed)  Results for orders placed or performed during the hospital encounter of 01/01/19 (from the past 24 hour(s))  Type and screen [place]     Status: None   Collection Time: 01/01/19  6:07 PM  Result Value Ref Range   ABO/RH(D) A POS    Antibody Screen NEG    Sample Expiration      01/04/2019,2359 Performed at [facility], [address] [place], [place] [address]   Comprehensive metabolic panel     Status: Abnormal   Collection Time: 01/01/19  6:27 PM  Result Value Ref Range   Sodium 135 135 - 145 mmol/L   Potassium 4.5 3.5 - 5.1 mmol/L   Chloride 98 98 - 111 mmol/L   CO2 28 22 - 32 mmol/L   Glucose, Bld 104 (H) 70 - 99 mg/dL   BUN <5 (L) 8 - 23 mg/dL   Creatinine, Ser 0.53 0.44 - 1.00 mg/dL   Calcium 8.9 8.9 - 10.3 mg/dL   Total Protein 7.3 6.5 - 8.1 g/dL   Albumin 3.5 3.5 - 5.0 g/dL   AST 14 (L) 15 - 41 U/L   ALT 10 0 - 44 U/L   Alkaline Phosphatase 48 38 - 126 U/L   Total Bilirubin 0.6 0.3 - 1.2 mg/dL   GFR calc non Af Amer >60 >60 mL/min   GFR calc Af Amer >60 >60 mL/min   Anion gap 9 5 - 15  CBC with Differential     Status: Abnormal   Collection Time: 01/01/19  6:27 PM  Result Value Ref Range   WBC 9.2 4.0 - 10.5 K/uL   RBC 5.11 3.87 - 5.11 MIL/uL   Hemoglobin 14.4 12.0 - 15.0 g/dL   HCT 44.8 36.0 - 46.0 %   MCV 87.7 80.0 - 100.0 fL   MCH 28.2 26.0  - 34.0 pg   MCHC 32.1 30.0 - 36.0 g/dL   RDW 16.7 (H) 11.5 - 15.5 %   Platelets 170 150 - 400 K/uL   nRBC 0.0 0.0 - 0.2 %   Neutrophils Relative % 75 %   Neutro Abs 6.9 1.7 - 7.7 K/uL   Lymphocytes Relative 15 %   Lymphs Abs 1.3 0.7 - 4.0 K/uL   Monocytes Relative 7 %   Monocytes Absolute 0.7 0.1 - 1.0 K/uL   Eosinophils Relative 2 %   Eosinophils Absolute 0.2 0.0 - 0.5 K/uL   Basophils Relative 1 %   Basophils Absolute 0.1 0.0 - 0.1 K/uL   Immature Granulocytes 0 %   Abs Immature  Granulocytes 0.03 0.00 - 0.07 K/uL  Protime-INR     Status: None   Collection Time: 01/01/19  6:27 PM  Result Value Ref Range   Prothrombin Time 13.9 11.4 - 15.2 seconds   INR 1.1 0.8 - 1.2  Troponin I (High Sensitivity)     Status: None   Collection Time: 01/01/19  6:27 PM  Result Value Ref Range   Troponin I (High Sensitivity) 4 <18 ng/L  APTT     Status: None   Collection Time: 01/01/19  6:27 PM  Result Value Ref Range   aPTT 30 24 - 36 seconds  Fibrin derivatives D-Dimer ([facility] only)     Status: Abnormal   Collection Time: 01/01/19  6:27 PM  Result Value Ref Range   Fibrin derivatives D-dimer ([facility]) 1,060.45 (H) 0.00 - 499.00 ng/mL (FEU)  POC SARS Coronavirus 2 Ag     Status: None   Collection Time: 01/01/19  7:22 PM  Result Value Ref Range   SARS Coronavirus 2 Ag NEGATIVE NEGATIVE  Troponin I (High Sensitivity)     Status: None   Collection Time: 01/01/19  8:44 PM  Result Value Ref Range   Troponin I (High Sensitivity) 4 <18 ng/L   ____________________________________________  EKG My review and personal interpretation at Time: 18:00   Indication: sob  Rate: 100  Rhythm: sinus Axis: normal Other: nonspecific st abn, no stemi criteria, ____________________________________________  RADIOLOGY  I personally reviewed all radiographic images ordered to evaluate for the above acute complaints and reviewed radiology reports and findings.  These findings were personally discussed with the  patient.  Please see medical record for radiology report.  ____________________________________________   PROCEDURES  Procedure(s) performed:  .Critical Care Performed by: Merlyn Lot, MD Authorized by: Merlyn Lot, MD   Critical care provider statement:    Critical care time (minutes):  30   Critical care time was exclusive of:  Separately billable procedures and treating other patients   Critical care was necessary to treat or prevent imminent or life-threatening deterioration of the following conditions:  Respiratory failure   Critical care was time spent personally by me on the following activities:  Development of treatment plan with patient or surrogate, discussions with consultants, evaluation of patient's response to treatment, examination of patient, obtaining history from patient or surrogate, ordering and performing treatments and interventions, ordering and review of laboratory studies, ordering and review of radiographic studies, pulse oximetry, re-evaluation of patient's condition and review of old charts      Critical Care performed: yes ____________________________________________   INITIAL IMPRESSION / [place] / ED COURSE  Pertinent labs & imaging results that were available during my care of the patient were reviewed by me and considered in my medical decision making (see chart for details).   DDX: Asthma, copd, CHF, pna, ptx, malignancy, Pe, anemia   Alicia Holder is a 68 y.o. who presents to the ED with symptoms as described above.  Patient arrives tachypneic but protecting her airway.  Wheezing concerning for COPD.  Will check for Covid.  She is not febrile currently.  Is complaining of chest pain associated with this.  EKG shows some nonspecific abnormalities.  Will give duoneb, solumedrol and reassess.  The patient will be placed on continuous pulse oximetry and telemetry for monitoring.  Laboratory evaluation will be sent to  evaluate for the above complaints.     Clinical Course as of Dec 31 2121  Tue Jan 01, 2019  2036 Blood work  is reassuring.  No evidence of PE.  Patient is noted be hypoxic down to 85% on room air.  Feels improved after DuoNeb but is requiring supplemental oxygen.  Still protecting her airway.  Covid negative. Trop negative.  Most c/w copd.  Given persistent hypoxia will require admission for further medical management.   [PR]    Clinical Course User Index [PR] Merlyn Lot, MD    The patient was evaluated in Emergency Department today for the symptoms described in the history of present illness. He/she was evaluated in the context of the global COVID-19 pandemic, which necessitated consideration that the patient might be at risk for infection with the SARS-CoV-2 virus that causes COVID-19. Institutional protocols and algorithms that pertain to the evaluation of patients at risk for COVID-19 are in a state of rapid change based on information released by regulatory bodies including the CDC and federal and state organizations. These policies and algorithms were followed during the patient's care in the ED.  As part of my medical decision making, I reviewed the following data within the [place] notes reviewed and incorporated, Labs reviewed, notes from prior ED visits and [employer] Controlled Substance Database   ____________________________________________   FINAL CLINICAL IMPRESSION(S) / ED DIAGNOSES  Final diagnoses:  COPD exacerbation ([place])      NEW MEDICATIONS STARTED DURING THIS VISIT:  New Prescriptions   No medications on file     Note:  This document was prepared using Dragon voice recognition software and may include unintentional dictation errors.    Merlyn Lot, MD 01/01/19 2123

## 2019-01-01 NOTE — Progress Notes (Signed)
PHARMACIST - PHYSICIAN COMMUNICATION  CONCERNING: P&T Medication Policy Regarding Oral Bisphosphonates  RECOMMENDATION: Your order for alendronate (Fosamax), ibandronate (Boniva), or risedronate (Actonel) has been discontinued at this time.  If the patient's post-hospital medical condition warrants safe use of this class of drugs, please resume the pre-hospital regimen upon discharge.  DESCRIPTION:  Alendronate (Fosamax), ibandronate (Boniva), and risedronate (Actonel) can cause severe esophageal erosions in patients who are unable to remain upright at least 30 minutes after taking this medication.   Since brief interruptions in therapy are thought to have minimal impact on bone mineral density, the [facility] has established that bisphosphonate orders should be routinely discontinued during hospitalization.   To override this safety policy and permit administration of Boniva, Fosamax, or Actonel in the hospital, prescribers must write "DO NOT HOLD" in the comments section when placing the order for this class of medications.  Pernell Dupre, PharmD, BCPS Clinical Pharmacist 01/01/2019 10:20 PM

## 2019-01-01 NOTE — H&P (Addendum)
TRH H&P    Patient Demographics:    Alicia Holder, is a 68 y.o. female  MRN: [id number]  DOB - 1950/03/16  Admit Date - 01/01/2019  Referring MD/NP/PA:   Quentin Cornwall  Outpatient Primary MD for the patient is Ellamae Sia, MD  Patient coming from:  home  Chief complaint- dyspnea   HPI:    Alicia Holder  is a 68 y.o. female,  w hypertension, hyperlipidemia, osteoporosis, Copd presents with c/o dyspnea worse for the past 1 week.  Pt notes slight dry cough.  Pt denies fever, chills, cp, palp, sob, n/v, abd pain, diarrhea, brbpr.  Pt notes that she was recently changed from spiriva and advair to Trelegy   In ED,  T 99.1, P 92 R 20, Bp 138/80  Pox 89% on RA Wt 70.8kg Na 135, K 4.5, Bun <5, Creatinine 0.53 Ast 14, Alt 10 Wbc 9.2, Hgb 14.4, Plt 170 INR 1.1 Trop 4    CTA chest  IMPRESSION: 1. No acute abnormalities. Specifically, no pulmonary emboli. 2. 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 3. Stable multinodular goiter. 4. Large chronic hiatal hernia, more prominent than on the prior study. 5. Aortic Atherosclerosis (ICD10-I70.0).  Pt given duoneb x3 and solumedrol 125mg  iv x1 in ED without relief.   Pt will be admitted for Copd exacerbatin      Review of systems:    In addition to the HPI above,  No Fever-chills, No Headache, No changes with Vision or hearing, No problems swallowing food or Liquids, No Chest pain, Cough or Shortness of Breath, No Abdominal pain, No Nausea or Vomiting, bowel movements are regular, No Blood in stool or Urine, No dysuria, No new skin rashes or bruises, No new joints pains-aches,  No new weakness, tingling, numbness in any extremity, No recent weight gain or loss, No polyuria, polydypsia or polyphagia, No significant Mental Stressors.  All other systems reviewed and are negative.    Past History of the following :      Past Medical History:  Diagnosis Date   AAA (abdominal aortic aneurysm) ([place])    Colon cancer ([place])       Past Surgical History:  Procedure Laterality Date   ABDOMINAL HYSTERECTOMY     APPENDECTOMY        Social History:      Social History   Tobacco Use   Smoking status: Current Some Day Smoker    Packs/day: 1.00    Years: 41.00    Pack years: 41.00   Smokeless tobacco: Never Used  Substance Use Topics   Alcohol use: Yes    Comment: Once every 3 weeks       Family History :     Family History  Problem Relation Age of Onset   CAD Mother    CAD Father        Home Medications:   Prior to Admission medications   Medication Sig Start Date End Date Taking? Authorizing Provider  ADVAIR DISKUS 250-50 MCG/DOSE AEPB Inhale 1 puff into the lungs  2 (two) times daily. 11/14/16  Yes [provider]  albuterol (VENTOLIN HFA) 108 (90 Base) MCG/ACT inhaler Inhale 1-2 puffs into the lungs every 4 (four) hours as needed for wheezing or shortness of breath.  07/09/18  Yes [provider]  alendronate (FOSAMAX) 70 MG tablet Take 70 mg by mouth once a week. 07/26/17  Yes [provider]  docusate sodium (COLACE) 100 MG capsule Take 100 mg by mouth daily.   Yes [provider]  lisinopril (ZESTRIL) 20 MG tablet Take 20 mg by mouth daily.    Yes [provider]  montelukast (SINGULAIR) 10 MG tablet Take 10 mg by mouth at bedtime. 11/14/16  Yes [provider]  oxyCODONE (OXY IR/ROXICODONE) 5 MG immediate release tablet Take 5 mg by mouth 3 (three) times daily as needed for moderate pain or breakthrough pain.    Yes [provider]  sertraline (ZOLOFT) 50 MG tablet Take 50 mg by mouth daily. 11/14/16  Yes [provider]  simvastatin (ZOCOR) 20 MG tablet Take 20 mg by mouth daily. 11/14/16  Yes [provider]     Allergies:    No Known Allergies   Physical Exam:   Vitals  Blood  pressure 132/86, pulse 87, temperature 99.1 F (37.3 C), resp. rate (!) 24, height 5\' 4"  (1.626 m), weight 70.8 kg, SpO2 92 %.  1.  General: Axoxo3  2. Psychiatric: euthymic  3. Neurologic: nonfocal  4. HEENMT:  Anicteric, pupils 1.51mm symmetric, direct, consensual, near intact Neck: no jvd  5. Respiratory : Prolonged exp phase,  + bilateral exp wheezing, no crackles.   6. Cardiovascular : rrr s1, s2, no m/g/r,   7. Gastrointestinal:  Abd: soft, nt, nd, +bs  8. Skin:  Ext: no c/c/e, no rash  9.Musculoskeletal:  Good ROM    Data Review:    CBC Recent Labs  Lab 01/01/19 1827  WBC 9.2  HGB 14.4  HCT 44.8  PLT 170  MCV 87.7  MCH 28.2  MCHC 32.1  RDW 16.7*  LYMPHSABS 1.3  MONOABS 0.7  EOSABS 0.2  BASOSABS 0.1   ------------------------------------------------------------------------------------------------------------------  Results for orders placed or performed during the hospital encounter of 01/01/19 (from the past 48 hour(s))  Type and screen [place]     Status: None   Collection Time: 01/01/19  6:07 PM  Result Value Ref Range   ABO/RH(D) A POS    Antibody Screen NEG    Sample Expiration      01/04/2019,2359 Performed at [facility], [address] [place], [place] [address]   Comprehensive metabolic panel     Status: Abnormal   Collection Time: 01/01/19  6:27 PM  Result Value Ref Range   Sodium 135 135 - 145 mmol/L   Potassium 4.5 3.5 - 5.1 mmol/L   Chloride 98 98 - 111 mmol/L   CO2 28 22 - 32 mmol/L   Glucose, Bld 104 (H) 70 - 99 mg/dL   BUN <5 (L) 8 - 23 mg/dL   Creatinine, Ser 0.53 0.44 - 1.00 mg/dL   Calcium 8.9 8.9 - 10.3 mg/dL   Total Protein 7.3 6.5 - 8.1 g/dL   Albumin 3.5 3.5 - 5.0 g/dL   AST 14 (L) 15 - 41 U/L   ALT 10 0 - 44 U/L   Alkaline Phosphatase 48 38 - 126 U/L   Total Bilirubin 0.6 0.3 - 1.2 mg/dL   GFR calc non Af Amer >60 >60 mL/min  GFR calc Af Amer >60 >60 mL/min   Anion gap  9 5 - 15    Comment: Performed at [facility], [address] [place], [place] [address]  CBC with Differential     Status: Abnormal   Collection Time: 01/01/19  6:27 PM  Result Value Ref Range   WBC 9.2 4.0 - 10.5 K/uL   RBC 5.11 3.87 - 5.11 MIL/uL   Hemoglobin 14.4 12.0 - 15.0 g/dL   HCT 44.8 36.0 - 46.0 %   MCV 87.7 80.0 - 100.0 fL   MCH 28.2 26.0 - 34.0 pg   MCHC 32.1 30.0 - 36.0 g/dL   RDW 16.7 (H) 11.5 - 15.5 %   Platelets 170 150 - 400 K/uL   nRBC 0.0 0.0 - 0.2 %   Neutrophils Relative % 75 %   Neutro Abs 6.9 1.7 - 7.7 K/uL   Lymphocytes Relative 15 %   Lymphs Abs 1.3 0.7 - 4.0 K/uL   Monocytes Relative 7 %   Monocytes Absolute 0.7 0.1 - 1.0 K/uL   Eosinophils Relative 2 %   Eosinophils Absolute 0.2 0.0 - 0.5 K/uL   Basophils Relative 1 %   Basophils Absolute 0.1 0.0 - 0.1 K/uL   Immature Granulocytes 0 %   Abs Immature Granulocytes 0.03 0.00 - 0.07 K/uL    Comment: Performed at [facility], [address] [place], [place] [address]  Protime-INR     Status: None   Collection Time: 01/01/19  6:27 PM  Result Value Ref Range   Prothrombin Time 13.9 11.4 - 15.2 seconds   INR 1.1 0.8 - 1.2    Comment: (NOTE) INR goal varies based on device and disease states. Performed at [facility], [address] [place], [place] [address]   Troponin I (High Sensitivity)     Status: None   Collection Time: 01/01/19  6:27 PM  Result Value Ref Range   Troponin I (High Sensitivity) 4 <18 ng/L    Comment: (NOTE) Elevated high sensitivity troponin I (hsTnI) values and significant  changes across serial measurements may suggest ACS but many other  chronic and acute conditions are known to elevate hsTnI results.  Refer to the "Links" section for chest pain algorithms and additional  guidance. Performed at [facility], [address] [place], [place] [address]   APTT     Status: None   Collection Time: 01/01/19  6:27 PM  Result Value  Ref Range   aPTT 30 24 - 36 seconds    Comment: Performed at [facility], [address] [place], [place] [address]  Fibrin derivatives D-Dimer [facility] only)     Status: Abnormal   Collection Time: 01/01/19  6:27 PM  Result Value Ref Range   Fibrin derivatives D-dimer ([facility]) 1,060.45 (H) 0.00 - 499.00 ng/mL (FEU)    Comment: (NOTE) <> Exclusion of Venous Thromboembolism (VTE) - OUTPATIENT ONLY   (Emergency Department or [place])   0-499 ng/ml (FEU): With a low to intermediate pretest probability                      for VTE this test result excludes the diagnosis                      of VTE.   >499 ng/ml (FEU) : VTE not excluded; additional work up for VTE is  required. <> Testing on Inpatients and Evaluation of Disseminated Intravascular   Coagulation (DIC) Reference Range:   0-499 ng/ml (FEU) Performed at [facility], [address] [place], [place] [address]   POC SARS Coronavirus 2 Ag     Status: None   Collection Time: 01/01/19  7:22 PM  Result Value Ref Range   SARS Coronavirus 2 Ag NEGATIVE NEGATIVE    Comment: (NOTE) SARS-CoV-2 antigen NOT DETECTED.  Negative results are presumptive.  Negative results do not preclude SARS-CoV-2 infection and should not be used as the sole basis for treatment or other patient management decisions, including infection  control decisions, particularly in the presence of clinical signs and  symptoms consistent with COVID-19, or in those who have been in contact with the virus.  Negative results must be combined with clinical observations, patient history, and epidemiological information. The expected result is Negative. Fact Sheet for Patients: PodPark.tn Fact Sheet for Healthcare Providers: GiftContent.is This test is not yet approved or cleared by the [place] FDA and  has been authorized for detection and/or diagnosis of SARS-CoV-2  by FDA under an Emergency Use Authorization (EUA).  This EUA will remain in effect (meaning this test can be used) for the duration of  the COVID-19 de claration under Section 564(b)(1) of the Act, 21 U.S.C. section 360bbb-3(b)(1), unless the authorization is terminated or revoked sooner.   Troponin I (High Sensitivity)     Status: None   Collection Time: 01/01/19  8:44 PM  Result Value Ref Range   Troponin I (High Sensitivity) 4 <18 ng/L    Comment: (NOTE) Elevated high sensitivity troponin I (hsTnI) values and significant  changes across serial measurements may suggest ACS but many other  chronic and acute conditions are known to elevate hsTnI results.  Refer to the "Links" section for chest pain algorithms and additional  guidance. Performed at [facility], [address] [place], [place] [address]     [place]  Lab 01/01/19 1827  NA 135  K 4.5  CL 98  CO2 28  GLUCOSE 104*  BUN <5*  CREATININE 0.53  CALCIUM 8.9  AST 14*  ALT 10  ALKPHOS 48  BILITOT 0.6   ------------------------------------------------------------------------------------------------------------------  ------------------------------------------------------------------------------------------------------------------ GFR: Estimated Creatinine Clearance: 64 mL/min (by C-G formula based on SCr of 0.53 mg/dL). Liver Function Tests: Recent Labs  Lab 01/01/19 1827  AST 14*  ALT 10  ALKPHOS 48  BILITOT 0.6  PROT 7.3  ALBUMIN 3.5   No results for input(s): LIPASE, AMYLASE in the last 168 hours. No results for input(s): AMMONIA in the last 168 hours. Coagulation Profile: Recent Labs  Lab 01/01/19 1827  INR 1.1   Cardiac Enzymes: No results for input(s): CKTOTAL, CKMB, CKMBINDEX, TROPONINI in the last 168 hours. BNP (last 3 results) No results for input(s): PROBNP in the last 8760 hours. HbA1C: No results for input(s): HGBA1C in the last 72 hours. CBG: No  results for input(s): GLUCAP in the last 168 hours. Lipid Profile: No results for input(s): CHOL, HDL, LDLCALC, TRIG, CHOLHDL, LDLDIRECT in the last 72 hours. Thyroid Function Tests: No results for input(s): TSH, T4TOTAL, FREET4, T3FREE, THYROIDAB in the last 72 hours. Anemia Panel: No results for input(s): VITAMINB12, FOLATE, FERRITIN, TIBC, IRON, RETICCTPCT in the last 72 hours.  --------------------------------------------------------------------------------------------------------------- Urine analysis: No results found for: COLORURINE, APPEARANCEUR, LABSPEC, PHURINE, GLUCOSEU, HGBUR, BILIRUBINUR, KETONESUR, PROTEINUR, UROBILINOGEN, NITRITE, LEUKOCYTESUR    Imaging Results:    Ct Angio Chest Pe W  And/or Wo Contrast  Result Date: 01/01/2019 CLINICAL DATA:  Chest pain and shortness of breath. The pain radiates to the back. Aneurysmal dilatation of the ascending thoracic aorta. Positive D-dimer. EXAM: CT ANGIOGRAPHY CHEST WITH CONTRAST TECHNIQUE: Multidetector CT imaging of the chest was performed using the standard protocol during bolus administration of intravenous contrast. Multiplanar CT image reconstructions and MIPs were obtained to evaluate the vascular anatomy. CONTRAST:  15mL OMNIPAQUE IOHEXOL 350 MG/ML SOLN COMPARISON:  CT scan dated 03/20/2018 FINDINGS: Cardiovascular: Aortic atherosclerosis. The ascending thoracic aorta is stable at 4.2 cm. Heart size is normal. Aberrant right subclavian artery. No aortic dissection. No pulmonary emboli. Mediastinum/Nodes: Stable multinodular goiter. Large chronic hiatal hernia, more prominent than on the prior study. Lungs/Pleura: Lungs are clear. No pleural effusion or pneumothorax. Upper Abdomen: No acute abnormality. Musculoskeletal: No chest wall abnormality. No acute or significant osseous findings. Chronic accentuation of the thoracic kyphosis. Review of the MIP images confirms the above findings. IMPRESSION: 1. No acute abnormalities.  Specifically, no pulmonary emboli. 2. 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 3. Stable multinodular goiter. 4. Large chronic hiatal hernia, more prominent than on the prior study. 5. Aortic Atherosclerosis (ICD10-I70.0). Electronically Signed   By: Lorriane Shire M.D.   On: 01/01/2019 20:20   Dg Chest Port 1 View  Result Date: 01/01/2019 CLINICAL DATA:  Chest pain and shortness of breath. Symptoms for 1 week, worse yesterday. EXAM: PORTABLE CHEST 1 VIEW COMPARISON:  Chest CT 03/20/2018. most recent radiograph 04/08/2010. FINDINGS: Borderline cardiomegaly. Aortic atherosclerosis and tortuosity, similar to prior exams. Retrocardiac hiatal hernia. Diffuse bronchial and interstitial thickening, likely smoking related. Streaky bibasilar atelectasis. No confluent airspace disease. No pleural effusion or pneumothorax. Remote left rib fractures. Bones are under mineralized. IMPRESSION: 1. Borderline cardiomegaly. 2. Aortic atherosclerosis and tortuosity, similar to prior exams. 3. Diffuse bronchial and interstitial thickening, likely smoking related. Streaky bibasilar atelectasis. Aortic Atherosclerosis (ICD10-I70.0). Electronically Signed   By: Keith Rake M.D.   On: 01/01/2019 18:42     ekg nsr at 98, nl axis, no st-t changes c/w ischemia    Assessment & Plan:    Principal Problem:   COPD with acute exacerbation (HCC) Active Problems:   Acute respiratory failure with hypoxia (HCC)   COPD exacerbation (HCC)  Acute respiratory failure with hypoxia Copd exacerbation Solumedrol 80mg  iv q8h Zithromax 500mg  iv qday Advair -> Dulera 2puff bid Albuterol HFA 1-2puff q4h prn  Spiriva 1puff qday  [place]fever Cont Singulair 10mg  po qday  Hypertension Cont Lisinopril 20mg  po qday   Hyperlipidemia Cont Simvastatin 20mg  po qhs  Osteoporosis Cont Fosamax 70mg  po qweek  Anxiety Cont Zoloft 50mg  po qday    DVT Prophylaxis-   Lovenox - SCDs   AM Labs Ordered, also  please review Full Orders  Family Communication: Admission, patients condition and plan of care including tests being ordered have been discussed with the patient who indicate understanding and agree with the plan and Code Status.  Code Status:  FULL CODE per patient, daughter is present with patient  Admission status: Observation: Based on patients clinical presentation and evaluation of above clinical data, I have made determination that patient meets observation criteria at this time.    Time spent in minutes : 70 minutes   Jani Gravel M.D on 01/01/2019 at 9:23 PM

## 2019-01-01 NOTE — ED Triage Notes (Signed)
Pt reports intermittent chest pain and SOB over the last week worsening yesterday. Pt reports has an aneurysm and is concerned because the pain is shooting straight through to her back. Pt reports she has been followed for the past 2 years with no change.

## 2019-01-02 ENCOUNTER — Ambulatory Visit: Payer: Medicare HMO | Admitting: Gastroenterology

## 2019-01-02 DIAGNOSIS — J441 Chronic obstructive pulmonary disease with (acute) exacerbation: Secondary | ICD-10-CM | POA: Diagnosis not present

## 2019-01-02 DIAGNOSIS — M810 Age-related osteoporosis without current pathological fracture: Secondary | ICD-10-CM | POA: Diagnosis present

## 2019-01-02 DIAGNOSIS — E785 Hyperlipidemia, unspecified: Secondary | ICD-10-CM | POA: Diagnosis present

## 2019-01-02 DIAGNOSIS — Z716 Tobacco abuse counseling: Secondary | ICD-10-CM

## 2019-01-02 DIAGNOSIS — I714 Abdominal aortic aneurysm, without rupture: Secondary | ICD-10-CM | POA: Diagnosis present

## 2019-01-02 DIAGNOSIS — J439 Emphysema, unspecified: Secondary | ICD-10-CM | POA: Diagnosis present

## 2019-01-02 DIAGNOSIS — Z90710 Acquired absence of both cervix and uterus: Secondary | ICD-10-CM | POA: Diagnosis not present

## 2019-01-02 DIAGNOSIS — Z7951 Long term (current) use of inhaled steroids: Secondary | ICD-10-CM | POA: Diagnosis not present

## 2019-01-02 DIAGNOSIS — R0600 Dyspnea, unspecified: Secondary | ICD-10-CM | POA: Diagnosis present

## 2019-01-02 DIAGNOSIS — J301 Allergic rhinitis due to pollen: Secondary | ICD-10-CM | POA: Diagnosis present

## 2019-01-02 DIAGNOSIS — F419 Anxiety disorder, unspecified: Secondary | ICD-10-CM | POA: Diagnosis present

## 2019-01-02 DIAGNOSIS — Z66 Do not resuscitate: Secondary | ICD-10-CM | POA: Diagnosis not present

## 2019-01-02 DIAGNOSIS — I10 Essential (primary) hypertension: Secondary | ICD-10-CM

## 2019-01-02 DIAGNOSIS — Z20828 Contact with and (suspected) exposure to other viral communicable diseases: Secondary | ICD-10-CM | POA: Diagnosis present

## 2019-01-02 DIAGNOSIS — Z79899 Other long term (current) drug therapy: Secondary | ICD-10-CM | POA: Diagnosis not present

## 2019-01-02 DIAGNOSIS — Z8249 Family history of ischemic heart disease and other diseases of the circulatory system: Secondary | ICD-10-CM | POA: Diagnosis not present

## 2019-01-02 DIAGNOSIS — F17210 Nicotine dependence, cigarettes, uncomplicated: Secondary | ICD-10-CM | POA: Diagnosis present

## 2019-01-02 DIAGNOSIS — Z85038 Personal history of other malignant neoplasm of large intestine: Secondary | ICD-10-CM | POA: Diagnosis not present

## 2019-01-02 DIAGNOSIS — D751 Secondary polycythemia: Secondary | ICD-10-CM | POA: Diagnosis present

## 2019-01-02 DIAGNOSIS — J9601 Acute respiratory failure with hypoxia: Secondary | ICD-10-CM | POA: Diagnosis present

## 2019-01-02 LAB — COMPREHENSIVE METABOLIC PANEL
ALT: 7 U/L (ref 0–44)
AST: 9 U/L — ABNORMAL LOW (ref 15–41)
Albumin: 3 g/dL — ABNORMAL LOW (ref 3.5–5.0)
Alkaline Phosphatase: 41 U/L (ref 38–126)
Anion gap: 8 (ref 5–15)
BUN: 9 mg/dL (ref 8–23)
CO2: 27 mmol/L (ref 22–32)
Calcium: 8.5 mg/dL — ABNORMAL LOW (ref 8.9–10.3)
Chloride: 101 mmol/L (ref 98–111)
Creatinine, Ser: 0.34 mg/dL — ABNORMAL LOW (ref 0.44–1.00)
GFR calc Af Amer: >60 mL/min (ref >60)
GFR calc non Af Amer: >60 mL/min (ref >60)
Glucose, Bld: 133 mg/dL — ABNORMAL HIGH (ref 70–99)
Potassium: 4.2 mmol/L (ref 3.5–5.1)
Sodium: 136 mmol/L (ref 135–145)
Total Bilirubin: 0.7 mg/dL (ref 0.3–1.2)
Total Protein: 6.5 g/dL (ref 6.5–8.1)

## 2019-01-02 LAB — CBC
HCT: 40.8 % (ref 36.0–46.0)
Hemoglobin: 12.8 g/dL (ref 12.0–15.0)
MCH: 28.1 pg (ref 26.0–34.0)
MCHC: 31.4 g/dL (ref 30.0–36.0)
MCV: 89.5 fL (ref 80.0–100.0)
Platelets: 177 10*3/uL (ref 150–400)
RBC: 4.56 MIL/uL (ref 3.87–5.11)
RDW: 16.5 % — ABNORMAL HIGH (ref 11.5–15.5)
WBC: 6.9 10*3/uL (ref 4.0–10.5)
nRBC: 0 % (ref 0.0–0.2)

## 2019-01-02 LAB — SARS CORONAVIRUS 2 (TAT 6-24 HRS): SARS Coronavirus 2: NEGATIVE

## 2019-01-02 LAB — PROCALCITONIN: Procalcitonin: <0.1 ng/mL

## 2019-01-02 LAB — HIV ANTIBODY (ROUTINE TESTING W REFLEX): HIV Screen 4th Generation wRfx: NONREACTIVE

## 2019-01-02 MED ORDER — AZITHROMYCIN 250 MG PO TABS
250.0000 mg | ORAL_TABLET | Freq: Every day | ORAL | Status: DC
  Administered 2019-01-02 – 2019-01-03 (×2): 250 mg via ORAL
  Filled 2019-01-02 (×2): qty 1

## 2019-01-02 MED ORDER — IPRATROPIUM-ALBUTEROL 0.5-2.5 (3) MG/3ML IN SOLN
3.0000 mL | RESPIRATORY_TRACT | Status: DC | PRN
  Administered 2019-01-02 – 2019-01-03 (×2): 3 mL via RESPIRATORY_TRACT
  Filled 2019-01-02 (×2): qty 3

## 2019-01-02 NOTE — Plan of Care (Signed)
  Problem: Clinical Measurements: Goal: Respiratory complications will improve Outcome: Progressing   Problem: Pain Managment: Goal: General experience of comfort will improve Outcome: Progressing   Problem: Activity: Goal: Risk for activity intolerance will decrease Outcome: Progressing

## 2019-01-02 NOTE — Progress Notes (Addendum)
[facility] NAME: Dinna Mccalvin    MR#:  [id number]  [place]:  1950-06-04  SUBJECTIVE:  patient came in from home with increasing shortness of breath she is breathing comfortably. Cough with clear fluid. No fever. Denies chest pain  REVIEW OF SYSTEMS:   Review of Systems  Constitutional: Negative for chills, fever and weight loss.  HENT: Negative for ear discharge, ear pain and nosebleeds.   Eyes: Negative for blurred vision, pain and discharge.  Respiratory: Positive for cough and shortness of breath. Negative for sputum production, wheezing and stridor.   Cardiovascular: Positive for orthopnea. Negative for chest pain, palpitations and PND.  Gastrointestinal: Negative for abdominal pain, diarrhea, nausea and vomiting.  Genitourinary: Negative for frequency and urgency.  Musculoskeletal: Negative for back pain and joint pain.  Neurological: Positive for weakness. Negative for sensory change, speech change and focal weakness.  Psychiatric/Behavioral: Negative for depression and hallucinations. The patient is not nervous/anxious.    Tolerating Diet: yes Tolerating PT:   DRUG ALLERGIES:  No Known Allergies  VITALS:  Blood pressure 126/82, pulse 83, temperature 97.8 F (36.6 C), temperature source Oral, resp. rate (!) 22, height 5\' 4"  (1.626 m), weight 70.8 kg, SpO2 90 %.  PHYSICAL EXAMINATION:   Physical Exam  GENERAL:  68 y.o.-year-old patient lying in the bed with no acute distress.  EYES: Pupils equal, round, reactive to light and accommodation. No scleral icterus. Extraocular muscles intact.  HEENT: Head atraumatic, normocephalic. Oropharynx and nasopharynx clear.  NECK:  Supple, no jugular venous distention. No thyroid enlargement, no tenderness.  LUNGS: distant breath sounds bilaterally, no wheezing, rales, scattered rhonchi. No use of accessory muscles of respiration.  CARDIOVASCULAR: S1, S2 normal. No murmurs,  rubs, or gallops.  ABDOMEN: Soft, nontender, nondistended. Bowel sounds present. No organomegaly or mass.  EXTREMITIES: No cyanosis, clubbing or edema b/l.    NEUROLOGIC: Cranial nerves II through XII are intact. No focal Motor or sensory deficits b/l.   PSYCHIATRIC:  patient is alert and oriented x 3.  SKIN: No obvious rash, lesion, or ulcer.   LABORATORY PANEL:  CBC Recent Labs  Lab 01/02/19 0612  WBC 6.9  HGB 12.8  HCT 40.8  PLT 177    Chemistries  Recent Labs  Lab 01/02/19 0612  NA 136  K 4.2  CL 101  CO2 27  GLUCOSE 133*  BUN 9  CREATININE 0.34*  CALCIUM 8.5*  AST 9*  ALT 7  ALKPHOS 41  BILITOT 0.7   Cardiac Enzymes No results for input(s): TROPONINI in the last 168 hours. RADIOLOGY:  Ct Angio Chest Pe W And/or Wo Contrast  Result Date: 01/01/2019 CLINICAL DATA:  Chest pain and shortness of breath. The pain radiates to the back. Aneurysmal dilatation of the ascending thoracic aorta. Positive D-dimer. EXAM: CT ANGIOGRAPHY CHEST WITH CONTRAST TECHNIQUE: Multidetector CT imaging of the chest was performed using the standard protocol during bolus administration of intravenous contrast. Multiplanar CT image reconstructions and MIPs were obtained to evaluate the vascular anatomy. CONTRAST:  62mL OMNIPAQUE IOHEXOL 350 MG/ML SOLN COMPARISON:  CT scan dated 03/20/2018 FINDINGS: Cardiovascular: Aortic atherosclerosis. The ascending thoracic aorta is stable at 4.2 cm. Heart size is normal. Aberrant right subclavian artery. No aortic dissection. No pulmonary emboli. Mediastinum/Nodes: Stable multinodular goiter. Large chronic hiatal hernia, more prominent than on the prior study. Lungs/Pleura: Lungs are clear. No pleural effusion or pneumothorax. Upper Abdomen: No acute abnormality. Musculoskeletal: No chest wall abnormality.  No acute or significant osseous findings. Chronic accentuation of the thoracic kyphosis. Review of the MIP images confirms the above findings. IMPRESSION: 1. No  acute abnormalities. Specifically, no pulmonary emboli. 2. 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 3. Stable multinodular goiter. 4. Large chronic hiatal hernia, more prominent than on the prior study. 5. Aortic Atherosclerosis (ICD10-I70.0). Electronically Signed   By: Lorriane Shire M.D.   On: 01/01/2019 20:20   US Venous Img Lower Bilateral  Result Date: 01/01/2019 CLINICAL DATA:  Shortness of breath, elevated D-dimer. EXAM: BILATERAL LOWER EXTREMITY VENOUS DOPPLER ULTRASOUND TECHNIQUE: Gray-scale sonography with graded compression, as well as color Doppler and duplex ultrasound were performed to evaluate the lower extremity deep venous systems from the level of the common femoral vein and including the common femoral, femoral, profunda femoral, popliteal and calf veins including the posterior tibial, peroneal and gastrocnemius veins when visible. The superficial great saphenous vein was also interrogated. Spectral Doppler was utilized to evaluate flow at rest and with distal augmentation maneuvers in the common femoral, femoral and popliteal veins. COMPARISON:  None. FINDINGS: RIGHT LOWER EXTREMITY Common Femoral Vein: No evidence of thrombus. Normal compressibility, respiratory phasicity and response to augmentation. Saphenofemoral Junction: No evidence of thrombus. Normal compressibility and flow on color Doppler imaging. Profunda Femoral Vein: No evidence of thrombus. Normal compressibility and flow on color Doppler imaging. Femoral Vein: No evidence of thrombus. Normal compressibility, respiratory phasicity and response to augmentation. Popliteal Vein: No evidence of thrombus. Normal compressibility, respiratory phasicity and response to augmentation. Calf Veins: Limited visualization, unremarkable. Superficial Great Saphenous Vein: No evidence of thrombus. Normal compressibility. Venous Reflux:  None. Other Findings:  None. LEFT LOWER EXTREMITY Common Femoral Vein: No evidence  of thrombus. Normal compressibility, respiratory phasicity and response to augmentation. Saphenofemoral Junction: No evidence of thrombus. Normal compressibility and flow on color Doppler imaging. Profunda Femoral Vein: No evidence of thrombus. Normal compressibility and flow on color Doppler imaging. Femoral Vein: No evidence of thrombus. Normal compressibility, respiratory phasicity and response to augmentation. Popliteal Vein: No evidence of thrombus. Normal compressibility, respiratory phasicity and response to augmentation. Calf Veins: No evidence of thrombus. Normal compressibility and flow on color Doppler imaging. Superficial Great Saphenous Vein: No evidence of thrombus. Normal compressibility. Venous Reflux:  None. Other Findings:  None. IMPRESSION: No evidence of deep venous thrombosis in either lower extremity. Electronically Signed   By: Rolm Baptise M.D.   On: 01/01/2019 22:39   Dg Chest Port 1 View  Result Date: 01/01/2019 CLINICAL DATA:  Chest pain and shortness of breath. Symptoms for 1 week, worse yesterday. EXAM: PORTABLE CHEST 1 VIEW COMPARISON:  Chest CT 03/20/2018. most recent radiograph 04/08/2010. FINDINGS: Borderline cardiomegaly. Aortic atherosclerosis and tortuosity, similar to prior exams. Retrocardiac hiatal hernia. Diffuse bronchial and interstitial thickening, likely smoking related. Streaky bibasilar atelectasis. No confluent airspace disease. No pleural effusion or pneumothorax. Remote left rib fractures. Bones are under mineralized. IMPRESSION: 1. Borderline cardiomegaly. 2. Aortic atherosclerosis and tortuosity, similar to prior exams. 3. Diffuse bronchial and interstitial thickening, likely smoking related. Streaky bibasilar atelectasis. Aortic Atherosclerosis (ICD10-I70.0). Electronically Signed   By: Keith Rake M.D.   On: 01/01/2019 18:42   ASSESSMENT AND PLAN:  Elora Bertolet  is a 68 y.o. female,  w hypertension, hyperlipidemia, osteoporosis, Copd presents with  c/o dyspnea worse for the past 1 week.  Pt notes slight dry cough.  Pt denies fever, chills, cp. Patient has long-standing history of heavy tobacco abuse.  *Acute hypoxic respiratory failure  secondary to COPD/emphysema exacerbation in the setting of heavy tobacco abuse long-standing -patient came in and was found to be hypoxic with sats in the 85 on room air -currently on 5 L nasal cannula oxygen sats around 89 to 92% -scattered wheezing -IV Solu-Medrol 80 mg TID -empirically on Zithromax. -Pro calcitonin pending -continue nebulizer, inhalers and Spiriva -patient will be assess for home oxygen need. -Patient will benefit from following pulmonary as outpatient. If does not improve will get pulmonary consultation and have -incentive spirometer -x-ray no evidence of pneumonia  2. 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 Ongoing tobacco abuse -patient advised smoking cessation-- she reports she is good to work on  4. Hypertension continue lisinopril  5. Hyperlipidemia on statins    Family communication : left message for daughter [name] consults : Discharge Disposition : home-- CODE STATUS: full DVT Prophylaxis : Lovenox  TOTAL TIME TAKING CARE OF THIS PATIENT: **30* minutes.  >50% time spent on counselling and coordination of care  POSSIBLE D/C IN *1 to 2** DAYS, DEPENDING ON CLINICAL CONDITION.  Note: This dictation was prepared with Dragon dictation along with smaller phrase technology. Any transcriptional errors that result from this process are unintentional.  Fritzi Mandes M.D on 01/02/2019 at 12:45 PM  Between 7am to 6pm - Pager - [phone]  After 6pm go to [email or web address]  [facility]   CC: Primary care physician; Ellamae Sia, MDPatient ID: Margarette Canada, female   DOB: 1950/09/07, 68 y.o.   MRN: [id number]

## 2019-01-02 NOTE — Progress Notes (Signed)
Md aware , no distress noted

## 2019-01-02 NOTE — ED Notes (Signed)
Attempted to call [facility], was told they were paging MD to figure out precautions for pt. Gave name/number and awaiting call back.

## 2019-01-02 NOTE — Progress Notes (Deleted)
Gastroenterology Consultation  Referring Provider:     Drue Second. Mike Gip, MD Primary Care Physician:  Frazier Richards, MD Primary Gastroenterologist:  Dr. Allen Norris     Reason for Consultation:     Weight loss and history of carcinoid tumor        HPI:   Alicia Holder is a 68 y.o. y/o female referred for consultation & management of weight loss and history of carcinoid tumor by Dr. Quay Burow, Ala Dach, MD.  This patient comes in today after seeing me back in 2014.  The patient has a history of erythrocytosis and is being treated by hematology.  The patient has lost 120 pounds in the last 3 years but reports it is with portion control and dieting.  The patient was due for repeat colonoscopy in 2019 but has not had that colonoscopy.  The patient was recently seen by hematology last month and recommended to come see me today for valuation prior to having a colonoscopy.  Past Medical History:  Diagnosis Date  . AAA (abdominal aortic aneurysm) ([place])   . Colon cancer [facility])     Past Surgical History:  Procedure Laterality Date  . ABDOMINAL HYSTERECTOMY    . APPENDECTOMY      Prior to Admission medications   Medication Sig Start Date End Date Taking? Authorizing Provider  ADVAIR DISKUS 250-50 MCG/DOSE AEPB Inhale 1 puff into the lungs 2 (two) times daily. 11/14/16   [provider]  albuterol (VENTOLIN HFA) 108 (90 Base) MCG/ACT inhaler Inhale 1-2 puffs into the lungs every 4 (four) hours as needed for wheezing or shortness of breath.  07/09/18   [provider]  alendronate (FOSAMAX) 70 MG tablet Take 70 mg by mouth once a week. 07/26/17   [provider]  docusate sodium (COLACE) 100 MG capsule Take 100 mg by mouth daily.    [provider]  lisinopril (ZESTRIL) 20 MG tablet Take 20 mg by mouth daily.     [provider]  montelukast (SINGULAIR) 10 MG tablet Take 10 mg by mouth at bedtime. 11/14/16   [provider]  oxyCODONE (OXY  IR/ROXICODONE) 5 MG immediate release tablet Take 5 mg by mouth 3 (three) times daily as needed for moderate pain or breakthrough pain.     [provider]  sertraline (ZOLOFT) 50 MG tablet Take 50 mg by mouth daily. 11/14/16   [provider]  simvastatin (ZOCOR) 20 MG tablet Take 20 mg by mouth daily. 11/14/16   [provider]    Family History  Problem Relation Age of Onset  . CAD Mother   . CAD Father      Social History   Tobacco Use  . Smoking status: Current Some Day Smoker    Packs/day: 1.00    Years: 41.00    Pack years: 41.00  . Smokeless tobacco: Never Used  Substance Use Topics  . Alcohol use: Yes    Comment: Once every 3 weeks  . Drug use: Not on file    Allergies as of 01/02/2019  . (No Known Allergies)    Review of Systems:    All systems reviewed and negative except where noted in HPI.   Physical Exam:  There were no vitals taken for this visit. No LMP recorded. Patient has had a hysterectomy. General:   Alert,  Well-developed, well-nourished, pleasant and cooperative in NAD Head:  Normocephalic and atraumatic. Eyes:  Sclera clear, no icterus.   Conjunctiva pink. Ears:  Normal  auditory acuity. Nose:  No deformity, discharge, or lesions. Mouth:  No deformity or lesions,oropharynx pink & moist. Neck:  Supple; no masses or thyromegaly. Lungs:  Respirations even and unlabored.  Clear throughout to auscultation.   No wheezes, crackles, or rhonchi. No acute distress. Heart:  Regular rate and rhythm; no murmurs, clicks, rubs, or gallops. Abdomen:  Normal bowel sounds.  No bruits.  Soft, non-tender and non-distended without masses, hepatosplenomegaly or hernias noted.  No guarding or rebound tenderness.  Negative Carnett sign.   Rectal:  Deferred.  Msk:  Symmetrical without gross deformities.  Good, equal movement & strength bilaterally. Pulses:  Normal pulses noted. Extremities:  No clubbing or edema.  No cyanosis. Neurologic:   Alert and oriented x3;  grossly normal neurologically. Skin:  Intact without significant lesions or rashes.  No jaundice. Lymph Nodes:  No significant cervical adenopathy. Psych:  Alert and cooperative. Normal mood and affect.  Imaging Studies: Ct Angio Chest Pe W And/or Wo Contrast  Result Date: 01/01/2019 CLINICAL DATA:  Chest pain and shortness of breath. The pain radiates to the back. Aneurysmal dilatation of the ascending thoracic aorta. Positive D-dimer. EXAM: CT ANGIOGRAPHY CHEST WITH CONTRAST TECHNIQUE: Multidetector CT imaging of the chest was performed using the standard protocol during bolus administration of intravenous contrast. Multiplanar CT image reconstructions and MIPs were obtained to evaluate the vascular anatomy. CONTRAST:  66mL OMNIPAQUE IOHEXOL 350 MG/ML SOLN COMPARISON:  CT scan dated 03/20/2018 FINDINGS: Cardiovascular: Aortic atherosclerosis. The ascending thoracic aorta is stable at 4.2 cm. Heart size is normal. Aberrant right subclavian artery. No aortic dissection. No pulmonary emboli. Mediastinum/Nodes: Stable multinodular goiter. Large chronic hiatal hernia, more prominent than on the prior study. Lungs/Pleura: Lungs are clear. No pleural effusion or pneumothorax. Upper Abdomen: No acute abnormality. Musculoskeletal: No chest wall abnormality. No acute or significant osseous findings. Chronic accentuation of the thoracic kyphosis. Review of the MIP images confirms the above findings. IMPRESSION: 1. No acute abnormalities. Specifically, no pulmonary emboli. 2. 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 3. Stable multinodular goiter. 4. Large chronic hiatal hernia, more prominent than on the prior study. 5. Aortic Atherosclerosis (ICD10-I70.0). Electronically Signed   By: Lorriane Shire M.D.   On: 01/01/2019 20:20   US Venous Img Lower Bilateral  Result Date: 01/01/2019 CLINICAL DATA:  Shortness of breath, elevated D-dimer. EXAM: BILATERAL LOWER  EXTREMITY VENOUS DOPPLER ULTRASOUND TECHNIQUE: Gray-scale sonography with graded compression, as well as color Doppler and duplex ultrasound were performed to evaluate the lower extremity deep venous systems from the level of the common femoral vein and including the common femoral, femoral, profunda femoral, popliteal and calf veins including the posterior tibial, peroneal and gastrocnemius veins when visible. The superficial great saphenous vein was also interrogated. Spectral Doppler was utilized to evaluate flow at rest and with distal augmentation maneuvers in the common femoral, femoral and popliteal veins. COMPARISON:  None. FINDINGS: RIGHT LOWER EXTREMITY Common Femoral Vein: No evidence of thrombus. Normal compressibility, respiratory phasicity and response to augmentation. Saphenofemoral Junction: No evidence of thrombus. Normal compressibility and flow on color Doppler imaging. Profunda Femoral Vein: No evidence of thrombus. Normal compressibility and flow on color Doppler imaging. Femoral Vein: No evidence of thrombus. Normal compressibility, respiratory phasicity and response to augmentation. Popliteal Vein: No evidence of thrombus. Normal compressibility, respiratory phasicity and response to augmentation. Calf Veins: Limited visualization, unremarkable. Superficial Great Saphenous Vein: No evidence of thrombus. Normal compressibility. Venous Reflux:  None. Other Findings:  None. LEFT LOWER EXTREMITY  Common Femoral Vein: No evidence of thrombus. Normal compressibility, respiratory phasicity and response to augmentation. Saphenofemoral Junction: No evidence of thrombus. Normal compressibility and flow on color Doppler imaging. Profunda Femoral Vein: No evidence of thrombus. Normal compressibility and flow on color Doppler imaging. Femoral Vein: No evidence of thrombus. Normal compressibility, respiratory phasicity and response to augmentation. Popliteal Vein: No evidence of thrombus. Normal  compressibility, respiratory phasicity and response to augmentation. Calf Veins: No evidence of thrombus. Normal compressibility and flow on color Doppler imaging. Superficial Great Saphenous Vein: No evidence of thrombus. Normal compressibility. Venous Reflux:  None. Other Findings:  None. IMPRESSION: No evidence of deep venous thrombosis in either lower extremity. Electronically Signed   By: Rolm Baptise M.D.   On: 01/01/2019 22:39   Dg Chest Port 1 View  Result Date: 01/01/2019 CLINICAL DATA:  Chest pain and shortness of breath. Symptoms for 1 week, worse yesterday. EXAM: PORTABLE CHEST 1 VIEW COMPARISON:  Chest CT 03/20/2018. most recent radiograph 04/08/2010. FINDINGS: Borderline cardiomegaly. Aortic atherosclerosis and tortuosity, similar to prior exams. Retrocardiac hiatal hernia. Diffuse bronchial and interstitial thickening, likely smoking related. Streaky bibasilar atelectasis. No confluent airspace disease. No pleural effusion or pneumothorax. Remote left rib fractures. Bones are under mineralized. IMPRESSION: 1. Borderline cardiomegaly. 2. Aortic atherosclerosis and tortuosity, similar to prior exams. 3. Diffuse bronchial and interstitial thickening, likely smoking related. Streaky bibasilar atelectasis. Aortic Atherosclerosis (ICD10-I70.0). Electronically Signed   By: Keith Rake M.D.   On: 01/01/2019 18:42    Assessment and Plan:   KRISTYL FANTA is a 68 y.o. y/o female ***    Alicia Lame, MD. [name]    Note: This dictation was prepared with Dragon dictation along with smaller phrase technology. Any transcriptional errors that result from this process are unintentional.

## 2019-01-02 NOTE — Plan of Care (Signed)
  Problem: Education: Goal: Knowledge of General Education information will improve Description: Including pain rating scale, medication(s)/side effects and non-pharmacologic comfort measures Outcome: Progressing   Problem: Health Behavior/Discharge Planning: Goal: Ability to manage health-related needs will improve Outcome: Progressing   Problem: Clinical Measurements: Goal: Diagnostic test results will improve Outcome: Progressing Goal: Respiratory complications will improve Outcome: Progressing Goal: Cardiovascular complication will be avoided Outcome: Progressing   Problem: Activity: Goal: Risk for activity intolerance will decrease Outcome: Progressing   Problem: Coping: Goal: Level of anxiety will decrease Outcome: Progressing   Problem: Elimination: Goal: Will not experience complications related to bowel motility Outcome: Progressing Goal: Will not experience complications related to urinary retention Outcome: Progressing   Problem: Pain Managment: Goal: General experience of comfort will improve Outcome: Progressing   Problem: Safety: Goal: Ability to remain free from injury will improve Outcome: Progressing

## 2019-01-03 MED ORDER — OXYCODONE HCL 5 MG PO TABS
5.0000 mg | ORAL_TABLET | Freq: Three times a day (TID) | ORAL | Status: DC | PRN
  Administered 2019-01-03 – 2019-01-05 (×6): 5 mg via ORAL
  Filled 2019-01-03 (×6): qty 1

## 2019-01-03 NOTE — Progress Notes (Signed)
[facility] [place] NAME: Alicia Holder    MR#:  [id number]  [place]:  04/16/1950  SUBJECTIVE:  patient came in from home with increasing shortness of breath she is breathing comfortably. Cough with clear fluid. No fever. Denies chest pain oxygen is down to 4 L per minute. Daughter came in the room.  Patient gets short winded easily  REVIEW OF SYSTEMS:   Review of Systems  Constitutional: Negative for chills, fever and weight loss.  HENT: Negative for ear discharge, ear pain and nosebleeds.   Eyes: Negative for blurred vision, pain and discharge.  Respiratory: Positive for cough and shortness of breath. Negative for sputum production, wheezing and stridor.   Cardiovascular: Positive for orthopnea. Negative for chest pain, palpitations and PND.  Gastrointestinal: Negative for abdominal pain, diarrhea, nausea and vomiting.  Genitourinary: Negative for frequency and urgency.  Musculoskeletal: Negative for back pain and joint pain.  Neurological: Positive for weakness. Negative for sensory change, speech change and focal weakness.  Psychiatric/Behavioral: Negative for depression and hallucinations. The patient is not nervous/anxious.    Tolerating Diet: yes Tolerating PT:   DRUG ALLERGIES:  No Known Allergies  VITALS:  Blood pressure 138/88, pulse 72, temperature 98.3 F (36.8 C), temperature source Oral, resp. rate 20, height 5\' 4"  (1.626 m), weight 68.6 kg, SpO2 91 %.  PHYSICAL EXAMINATION:   Physical Exam  GENERAL:  68 y.o.-year-old patient lying in the bed with no acute distress.  EYES: Pupils equal, round, reactive to light and accommodation. No scleral icterus. Extraocular muscles intact.  HEENT: Head atraumatic, normocephalic. Oropharynx and nasopharynx clear.  NECK:  Supple, no jugular venous distention. No thyroid enlargement, no tenderness.  LUNGS: distant breath sounds bilaterally, no wheezing, rales, scattered  rhonchi. No use of accessory muscles of respiration.  CARDIOVASCULAR: S1, S2 normal. No murmurs, rubs, or gallops.  ABDOMEN: Soft, nontender, nondistended. Bowel sounds present. No organomegaly or mass.  EXTREMITIES: No cyanosis, clubbing or edema b/l.    NEUROLOGIC: Cranial nerves II through XII are intact. No focal Motor or sensory deficits b/l.   PSYCHIATRIC:  patient is alert and oriented x 3.  SKIN: No obvious rash, lesion, or ulcer.   LABORATORY PANEL:  CBC Recent Labs  Lab 01/02/19 0612  WBC 6.9  HGB 12.8  HCT 40.8  PLT 177    Chemistries  Recent Labs  Lab 01/02/19 0612  NA 136  K 4.2  CL 101  CO2 27  GLUCOSE 133*  BUN 9  CREATININE 0.34*  CALCIUM 8.5*  AST 9*  ALT 7  ALKPHOS 41  BILITOT 0.7   Cardiac Enzymes No results for input(s): TROPONINI in the last 168 hours. RADIOLOGY:  Ct Angio Chest Pe W And/or Wo Contrast  Result Date: 01/01/2019 CLINICAL DATA:  Chest pain and shortness of breath. The pain radiates to the back. Aneurysmal dilatation of the ascending thoracic aorta. Positive D-dimer. EXAM: CT ANGIOGRAPHY CHEST WITH CONTRAST TECHNIQUE: Multidetector CT imaging of the chest was performed using the standard protocol during bolus administration of intravenous contrast. Multiplanar CT image reconstructions and MIPs were obtained to evaluate the vascular anatomy. CONTRAST:  79mL OMNIPAQUE IOHEXOL 350 MG/ML SOLN COMPARISON:  CT scan dated 03/20/2018 FINDINGS: Cardiovascular: Aortic atherosclerosis. The ascending thoracic aorta is stable at 4.2 cm. Heart size is normal. Aberrant right subclavian artery. No aortic dissection. No pulmonary emboli. Mediastinum/Nodes: Stable multinodular goiter. Large chronic hiatal hernia, more prominent than on the prior study. Lungs/Pleura:  Lungs are clear. No pleural effusion or pneumothorax. Upper Abdomen: No acute abnormality. Musculoskeletal: No chest wall abnormality. No acute or significant osseous findings. Chronic accentuation  of the thoracic kyphosis. Review of the MIP images confirms the above findings. IMPRESSION: 1. No acute abnormalities. Specifically, no pulmonary emboli. 2. 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 3. Stable multinodular goiter. 4. Large chronic hiatal hernia, more prominent than on the prior study. 5. Aortic Atherosclerosis (ICD10-I70.0). Electronically Signed   By: Lorriane Shire M.D.   On: 01/01/2019 20:20   US Venous Img Lower Bilateral  Result Date: 01/01/2019 CLINICAL DATA:  Shortness of breath, elevated D-dimer. EXAM: BILATERAL LOWER EXTREMITY VENOUS DOPPLER ULTRASOUND TECHNIQUE: Gray-scale sonography with graded compression, as well as color Doppler and duplex ultrasound were performed to evaluate the lower extremity deep venous systems from the level of the common femoral vein and including the common femoral, femoral, profunda femoral, popliteal and calf veins including the posterior tibial, peroneal and gastrocnemius veins when visible. The superficial great saphenous vein was also interrogated. Spectral Doppler was utilized to evaluate flow at rest and with distal augmentation maneuvers in the common femoral, femoral and popliteal veins. COMPARISON:  None. FINDINGS: RIGHT LOWER EXTREMITY Common Femoral Vein: No evidence of thrombus. Normal compressibility, respiratory phasicity and response to augmentation. Saphenofemoral Junction: No evidence of thrombus. Normal compressibility and flow on color Doppler imaging. Profunda Femoral Vein: No evidence of thrombus. Normal compressibility and flow on color Doppler imaging. Femoral Vein: No evidence of thrombus. Normal compressibility, respiratory phasicity and response to augmentation. Popliteal Vein: No evidence of thrombus. Normal compressibility, respiratory phasicity and response to augmentation. Calf Veins: Limited visualization, unremarkable. Superficial Great Saphenous Vein: No evidence of thrombus. Normal compressibility.  Venous Reflux:  None. Other Findings:  None. LEFT LOWER EXTREMITY Common Femoral Vein: No evidence of thrombus. Normal compressibility, respiratory phasicity and response to augmentation. Saphenofemoral Junction: No evidence of thrombus. Normal compressibility and flow on color Doppler imaging. Profunda Femoral Vein: No evidence of thrombus. Normal compressibility and flow on color Doppler imaging. Femoral Vein: No evidence of thrombus. Normal compressibility, respiratory phasicity and response to augmentation. Popliteal Vein: No evidence of thrombus. Normal compressibility, respiratory phasicity and response to augmentation. Calf Veins: No evidence of thrombus. Normal compressibility and flow on color Doppler imaging. Superficial Great Saphenous Vein: No evidence of thrombus. Normal compressibility. Venous Reflux:  None. Other Findings:  None. IMPRESSION: No evidence of deep venous thrombosis in either lower extremity. Electronically Signed   By: Rolm Baptise M.D.   On: 01/01/2019 22:39   Dg Chest Port 1 View  Result Date: 01/01/2019 CLINICAL DATA:  Chest pain and shortness of breath. Symptoms for 1 week, worse yesterday. EXAM: PORTABLE CHEST 1 VIEW COMPARISON:  Chest CT 03/20/2018. most recent radiograph 04/08/2010. FINDINGS: Borderline cardiomegaly. Aortic atherosclerosis and tortuosity, similar to prior exams. Retrocardiac hiatal hernia. Diffuse bronchial and interstitial thickening, likely smoking related. Streaky bibasilar atelectasis. No confluent airspace disease. No pleural effusion or pneumothorax. Remote left rib fractures. Bones are under mineralized. IMPRESSION: 1. Borderline cardiomegaly. 2. Aortic atherosclerosis and tortuosity, similar to prior exams. 3. Diffuse bronchial and interstitial thickening, likely smoking related. Streaky bibasilar atelectasis. Aortic Atherosclerosis (ICD10-I70.0). Electronically Signed   By: Keith Rake M.D.   On: 01/01/2019 18:42   ASSESSMENT AND PLAN:   Meoshia Hawken  is a 68 y.o. female,  w hypertension, hyperlipidemia, osteoporosis, Copd presents with c/o dyspnea worse for the past 1 week.  Pt notes slight dry cough.  Pt denies fever, chills, cp. Patient has long-standing history of heavy tobacco abuse.  *Acute hypoxic respiratory failure secondary to COPD/emphysema exacerbation in the setting of heavy tobacco abuse long-standing -patient came in and was found to be hypoxic with sats in the 85 on room air -currently on 5 L nasal cannula oxygen sats around 89 to 92% -scattered wheezing -IV Solu-Medrol 80 mg TID -Pro calcitonin <0.10-- no indication for antibiotics -continue nebulizer, inhalers and Spiriva -patient will be assess for home oxygen need. -Patient will benefit from following pulmonary as outpatient. If does not improve will get pulmonary consultation and have -incentive spirometer -x-ray no evidence of pneumonia  2. 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 Ongoing tobacco abuse -patient advised smoking cessation-- she reports she is good to work on  4. Hypertension continue lisinopril  5. Hyperlipidemia on statins    Family communication : discussed with daughter [name] consults : Discharge Disposition : home- CODE STATUS: full DVT Prophylaxis : Lovenox  TOTAL TIME TAKING CARE OF THIS PATIENT: **25* minutes.  >50% time spent on counselling and coordination of care  POSSIBLE D/C IN *1 to 2** DAYS, DEPENDING ON CLINICAL CONDITION.  Note: This dictation was prepared with Dragon dictation along with smaller phrase technology. Any transcriptional errors that result from this process are unintentional.  Fritzi Mandes M.D on 01/03/2019 at 3:56 PM  Between 7am to 6pm - Pager - [phone]  After 6pm go to [email or web address]  [facility]   CC: Primary care physician; Ellamae Sia, MDPatient ID: Margarette Canada, female   DOB: [birth year], 68 y.o.   MRN: [id number]

## 2019-01-04 MED ORDER — METHYLPREDNISOLONE SODIUM SUCC 125 MG IJ SOLR
60.0000 mg | Freq: Two times a day (BID) | INTRAMUSCULAR | Status: DC
  Administered 2019-01-04 – 2019-01-05 (×2): 60 mg via INTRAVENOUS
  Filled 2019-01-04 (×2): qty 2

## 2019-01-04 MED ORDER — ALPRAZOLAM 0.25 MG PO TABS
0.2500 mg | ORAL_TABLET | Freq: Two times a day (BID) | ORAL | Status: DC | PRN
  Administered 2019-01-04 – 2019-01-05 (×3): 0.25 mg via ORAL
  Filled 2019-01-04 (×3): qty 1

## 2019-01-04 NOTE — TOC Initial Note (Signed)
Transition of Care [facility]) - Initial/Assessment Note    Patient Details  Name: Alicia Holder MRN: [id number] Date of Birth: 01-08-1951  Transition of Care [facility]) CM/SW Contact:    Ross Ludwig, LCSW Phone Number: 01/04/2019, 6:06 PM  Clinical Narrative:                 Patient is a 68 year old female who lives alone.  Patient is alert and oriented x4.  Per physician, patient will need oxygen, CSW requested that bedside nurse qualify patient for oxygen.  CSW contacted Brad with [employer] regarding patient needing oxygen potentially ready for discharge on Saturday if MD feels she is ready for discharge.  CSW to continue to follow patient's progress throughout discharge planning.  Expected Discharge Plan: [place] Barriers to Discharge: Continued Medical Work up   Patient Goals and CMS Choice Patient states their goals for this hospitalization and ongoing recovery are:: To return back home with home health and oxygen CMS Medicare.gov Compare Post Acute Care list provided to:: Patient Choice offered to / list presented to : Patient  Expected Discharge Plan and Services Expected Discharge Plan: [facility] In-house Referral: Clinical Social Work   Post Acute Care Choice: Durable Medical Equipment Living arrangements for the past 2 months: Single Family Home                 DME Arranged: Oxygen DME Agency: [employer] Date DME Agency Contacted: 01/04/19 Time DME Agency Contacted: 0930 Representative spoke with at DME Agency: Leroy Sea            Prior Living Arrangements/Services Living arrangements for the past 2 months: [place] Lives with:: Self Patient language and need for interpreter reviewed:: Yes Do you feel safe going back to the place where you live?: Yes      Need for Family Participation in Patient Care: No (Comment) Care giver support system in place?: No (comment)   Criminal Activity/Legal Involvement Pertinent to  Current Situation/Hospitalization: No - Comment as needed  Activities of Daily Living      Permission Sought/Granted Permission sought to share information with : Case Manager Permission granted to share information with : Yes, Verbal Permission Granted  Share Information with NAME: Ward Givens Daughter   [phone]           Emotional Assessment Appearance:: Appears stated age   Affect (typically observed): Accepting, Calm Orientation: : Oriented to Self, Oriented to Place, Oriented to  Time, Oriented to Situation Alcohol / Substance Use: Not Applicable Psych Involvement: No (comment)  Admission diagnosis:  COPD exacerbation (HCC) [J44.1] Acute respiratory failure with hypoxia ([place]) [J96.01] Patient Active Problem List   Diagnosis Date Noted  . Essential hypertension   . Hyperlipidemia   . Tobacco abuse counseling   . COPD with acute exacerbation ([place]) 01/01/2019  . Acute respiratory failure with hypoxia ([place]) 01/01/2019  . COPD exacerbation ([place]) 01/01/2019  . Goals of care, counseling/discussion 05/10/2017  . Aortic aneurysm, thoracic ([place]) 03/30/2017  . Thyroid nodule 03/30/2017  . Secondary erythrocytosis 01/04/2017   PCP:  Ellamae Sia, MD Pharmacy:   [id number] - [place], [place] [address] Phone: [phone] Fax: [phone]     Social Determinants of Health (SDOH) Interventions    Readmission Risk Interventions No flowsheet data found.

## 2019-01-04 NOTE — Plan of Care (Signed)
  Problem: Education: Goal: Knowledge of General Education information will improve Description: Including pain rating scale, medication(s)/side effects and non-pharmacologic comfort measures Outcome: Progressing   Problem: Health Behavior/Discharge Planning: Goal: Ability to manage health-related needs will improve Outcome: Progressing   Problem: Clinical Measurements: Goal: Diagnostic test results will improve Outcome: Progressing Goal: Respiratory complications will improve Outcome: Progressing Goal: Cardiovascular complication will be avoided Outcome: Progressing   Problem: Activity: Goal: Risk for activity intolerance will decrease Outcome: Progressing   Problem: Coping: Goal: Level of anxiety will decrease Outcome: Progressing   Problem: Elimination: Goal: Will not experience complications related to bowel motility Outcome: Progressing Goal: Will not experience complications related to urinary retention Outcome: Progressing   Problem: Pain Managment: Goal: General experience of comfort will improve Outcome: Progressing   Problem: Safety: Goal: Ability to remain free from injury will improve Outcome: Progressing

## 2019-01-04 NOTE — Progress Notes (Signed)
[place] [facility] [place] NAME: Alicia Holder    MR#:  [id number]  [place]:  August 29, 1950  SUBJECTIVE:  patient came in from home with increasing shortness of breath she is breathing comfortably. Cough with clear fluid. No fever. Denies chest pain oxygen is down to 3 L per minute  Patient gets short winded easily-- however recovers in few minutes.\ She has some issues with anxiety which shortness of breath.  REVIEW OF SYSTEMS:   Review of Systems  Constitutional: Negative for chills, fever and weight loss.  HENT: Negative for ear discharge, ear pain and nosebleeds.   Eyes: Negative for blurred vision, pain and discharge.  Respiratory: Positive for cough and shortness of breath. Negative for sputum production, wheezing and stridor.   Cardiovascular: Positive for orthopnea. Negative for chest pain, palpitations and PND.  Gastrointestinal: Negative for abdominal pain, diarrhea, nausea and vomiting.  Genitourinary: Negative for frequency and urgency.  Musculoskeletal: Negative for back pain and joint pain.  Neurological: Positive for weakness. Negative for sensory change, speech change and focal weakness.  Psychiatric/Behavioral: Negative for depression and hallucinations. The patient is not nervous/anxious.    Tolerating Diet: yes Tolerating PT: ambulates by herself  DRUG ALLERGIES:  No Known Allergies  VITALS:  Blood pressure 133/82, pulse 78, temperature 98.4 F (36.9 C), temperature source Oral, resp. rate 14, height 5\' 4"  (1.626 m), weight 68.6 kg, SpO2 94 %.  PHYSICAL EXAMINATION:   Physical Exam  GENERAL:  68 y.o.-year-old patient lying in the bed with mild acute distress.  EYES: Pupils equal, round, reactive to light and accommodation. No scleral icterus. Extraocular muscles intact.  HEENT: Head atraumatic, normocephalic. Oropharynx and nasopharynx clear.  NECK:  Supple, no jugular venous distention. No thyroid enlargement,  no tenderness.  LUNGS: distant breath sounds bilaterally, no wheezing, rales, scattered rhonchi. No use of accessory muscles of respiration. Emphysematous chest CARDIOVASCULAR: S1, S2 normal. No murmurs, rubs, or gallops.  ABDOMEN: Soft, nontender, nondistended. Bowel sounds present. No organomegaly or mass.  EXTREMITIES: No cyanosis, clubbing or edema b/l.    NEUROLOGIC: Cranial nerves II through XII are intact. No focal Motor or sensory deficits b/l.   PSYCHIATRIC:  patient is alert and oriented x 3. Mild anxiety SKIN: No obvious rash, lesion, or ulcer.   LABORATORY PANEL:  CBC Recent Labs  Lab 01/02/19 0612  WBC 6.9  HGB 12.8  HCT 40.8  PLT 177    Chemistries  Recent Labs  Lab 01/02/19 0612  NA 136  K 4.2  CL 101  CO2 27  GLUCOSE 133*  BUN 9  CREATININE 0.34*  CALCIUM 8.5*  AST 9*  ALT 7  ALKPHOS 41  BILITOT 0.7   Cardiac Enzymes No results for input(s): TROPONINI in the last 168 hours. RADIOLOGY:  No results found. ASSESSMENT AND PLAN:  Alicia Holder  is a 69 y.o. female,  w hypertension, hyperlipidemia, osteoporosis, Copd presents with c/o dyspnea worse for the past 1 week.  Pt notes slight dry cough.  Pt denies fever, chills, cp. Patient has long-standing history of heavy tobacco abuse.  *Acute hypoxic respiratory failure secondary to COPD/emphysema exacerbation in the setting of heavy tobacco abuse long-standing -patient came in and was found to be hypoxic with sats in the 85 on room air -currently on 3 L nasal cannula oxygen sats around 89 to 92% -IV Solu-Medrol 80 mg TID-- 60 BID-- switch to oral steroid taper from tomorrow -Pro calcitonin <0.10-- no indication for  antibiotics -continue nebulizer, inhalers and Spiriva -patient will be assess for home oxygen need, nebulizer for home -Patient will benefit from following pulmonary as outpatient. -incentive spirometer -x-ray no evidence of pneumonia  2. Secondary erythrocytosis secondary to heavy  smoking -patient goes to the cancer center for phlebotomy -last one was done two months ago  3. Ongoing tobacco abuse -patient advised smoking cessation-- she reports she is good to work on  4. Hypertension continue lisinopril  5. Hyperlipidemia on statins  I had a long discussion with patient and her daughter [name]. Patient understands her long-term poor prognosis with emphysema. Goals of care were discussed. Patient states she does not want to be on life support. She tells me she has discussed with her daughter came and she is requesting DNR.  Out of bed to chair. If continues to improve will discharge her to home with oxygen and nebulizer.  Family communication : discussed with daughter [name] consults : none Discharge Disposition : home- CODE STATUS: DNR DVT Prophylaxis : Lovenox  TOTAL TIME TAKING CARE OF THIS PATIENT: **25* minutes.  >50% time spent on counselling and coordination of care  POSSIBLE D/C IN *1 to 2** DAYS, DEPENDING ON CLINICAL CONDITION.  Note: This dictation was prepared with Dragon dictation along with smaller phrase technology. Any transcriptional errors that result from this process are unintentional.  Fritzi Mandes M.D on 01/04/2019 at 11:05 AM  Between 7am to 6pm - Pager - [phone]  After 6pm go to [email or web address]  [facility]   CC: Primary care physician; Ellamae Sia, MDPatient ID: Alicia Holder, female   DOB: Mar 20, 1950, 68 y.o.   MRN: [id number]

## 2019-01-04 NOTE — Care Management Important Message (Signed)
Important Message  Patient Details  Name: Alicia Holder MRN: [id number] Date of Birth: October 04, 1950   Medicare Important Message Given:  Yes     Dannette Barbara 01/04/2019, 11:58 AM

## 2019-01-04 NOTE — Progress Notes (Signed)
Cardiovascular and Pulmonary Nurse Navigator Note:    68 year old female with history of hypertension, hyperlipidemia, osteoporosis, and COPD who presented to the ED with worsening shortness of breath.  Patient has a long history of heavy tobacco abuse.  Patient admitted with acute hypoxic respiratory failure secondary to COPD / emphysema exacerbation in the setting of heavy tobacco abuse long-standing.  Patient also has secondary erythrocytosis secondary to heavy smoking and is followed at the cancer center for phlebotomy.  Last one done two months ago.   This RN discussed Pulmonary Rehab with Dr. Fritzi Mandes.  Dr. Posey Pronto referred patient to outpatient Pulmonary Rehab at [facility].  Dr. Posey Pronto also plans to schedule patient to follow-up with Dr. Lanney Gins, Pulmonologist, as an outpatient.    Patient lives with her daughter who works full time.  Patient reports that her son-in-law or daughter or grand-daughter drives her to her appointments.    Rounded on patient.  Patient informed of the referral to outpatient Pulmonary Rehab.  Patient interested in participating.  Informational letter with CPT billing codes and brochure given to patient. Overview of the program provided.  Explained to patient that Pulmonary Rehab department will contact her in one to two weeks after discharge to schedule her first appointment.  Phone number to department provided should she have any questions.    Patient appreciative of the above information.    Roanna Epley, RN, BSN, [facility]  Cardiovascular & Pulmonary Nurse Navigator  Direct Line: [phone]  Department Phone #: [phone] Fax: [phone]  Email Address: [email or web address]

## 2019-01-05 MED ORDER — ALPRAZOLAM 0.25 MG PO TABS: 0.2500 mg | ORAL_TABLET | Freq: Two times a day (BID) | ORAL | 0 refills | Status: DC | PRN

## 2019-01-05 MED ORDER — IPRATROPIUM-ALBUTEROL 0.5-2.5 (3) MG/3ML IN SOLN: 3.0000 mL | RESPIRATORY_TRACT | 0 refills | Status: DC | PRN

## 2019-01-05 MED ORDER — PREDNISONE 20 MG PO TABS: 60.0000 mg | ORAL_TABLET | Freq: Every day | ORAL | Status: DC

## 2019-01-05 MED ORDER — TIOTROPIUM BROMIDE MONOHYDRATE 18 MCG IN CAPS: 18.0000 ug | ORAL_CAPSULE | Freq: Every day | RESPIRATORY_TRACT | 12 refills | Status: DC

## 2019-01-05 MED ORDER — PREDNISONE 10 MG PO TABS: 60.0000 mg | ORAL_TABLET | Freq: Every day | ORAL | 0 refills | Status: DC

## 2019-01-05 NOTE — Plan of Care (Signed)
  Problem: Education: Goal: Knowledge of General Education information will improve Description: Including pain rating scale, medication(s)/side effects and non-pharmacologic comfort measures Outcome: Progressing   Problem: Health Behavior/Discharge Planning: Goal: Ability to manage health-related needs will improve Outcome: Progressing   Problem: Clinical Measurements: Goal: Diagnostic test results will improve Outcome: Progressing Goal: Respiratory complications will improve Outcome: Progressing Goal: Cardiovascular complication will be avoided Outcome: Progressing   Problem: Activity: Goal: Risk for activity intolerance will decrease Outcome: Progressing   Problem: Coping: Goal: Level of anxiety will decrease Outcome: Progressing   Problem: Elimination: Goal: Will not experience complications related to bowel motility Outcome: Progressing Goal: Will not experience complications related to urinary retention Outcome: Progressing   Problem: Pain Managment: Goal: General experience of comfort will improve Outcome: Progressing   Problem: Safety: Goal: Ability to remain free from injury will improve Outcome: Progressing

## 2019-01-05 NOTE — Progress Notes (Addendum)
SATURATION QUALIFICATIONS: (This note is used to comply with regulatory documentation for home oxygen)  Patient Saturations on Room Air at Rest = 86%  Patient Saturations on Room Air while Ambulating = 83%  Patient Saturations on 4 Liters of oxygen while Ambulating = 91%  Please briefly explain why patient needs home oxygen: pt's oxygen saturation drops on room air; pt needs 4L for ambulation. Deirdre Pippins, RN

## 2019-01-05 NOTE — Discharge Summary (Signed)
[facility] NAME: Alicia Holder    MR#:  [id number]  DATE OF BIRTH:  11/22/1950  DATE OF ADMISSION:  01/01/2019 ADMITTING PHYSICIAN: Fritzi Mandes, MD  DATE OF DISCHARGE: 01/05/2019  PRIMARY CARE PHYSICIAN: Ellamae Sia, MD    ADMISSION DIAGNOSIS:  COPD exacerbation ([place]) [J44.1] Acute respiratory failure with hypoxia ([place]) [J96.01]  DISCHARGE DIAGNOSIS:  Acute Hypoxic respiratory failure due to COPD/Emphysema exacerbation   SECONDARY DIAGNOSIS:   Past Medical History:  Diagnosis Date  . AAA (abdominal aortic aneurysm) ([place])   . Colon cancer [facility])     HOSPITAL COURSE:  Alicia Holder a68 y.o.female,w hypertension, hyperlipidemia, osteoporosis, Copd presents with c/o dyspneaworse for the past 1 week. Pt notes slight dry cough. Pt denies fever, chills, cp. Patient has long-standing history of heavy tobacco abuse.  *Acute hypoxic respiratory failure secondary to COPD/emphysema exacerbation in the setting of heavy tobacco abuse long-standing -patient came in and was found to be hypoxic with sats in the 85 on room air -currently on 3 L nasal cannula oxygen sats around 89 to 92% -IV Solu-Medrol 80 mg TID-- 60 BID-- switch to oral steroid taper from today -Pro calcitonin <0.10-- no indication for antibiotics -continue nebulizer, inhalers and Spiriva -patient will need home oxygen and nebulizer for home -Patient will benefit from following pulmonary as outpatient--dr Aleskerov informed -Pulmonary rehab arranged -incentive spirometer -x-ray no evidence of pneumonia  2. 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 Ongoing tobacco abuse -patient advised smoking cessation-- she reports she will  4. Hypertension continue lisinopril  5. Hyperlipidemia on statins  I had a long discussion with patient and her  daughter Alicia Holder. Patient understands her long-term poor prognosis with emphysema. Goals of care were discussed. Patient states she does not want to be on life support. She tells me she has discussed with her daughter came and she is requesting DNR.  D/c home today  Family communication : discussed with daughter Alicia Holder consults : none Discharge Disposition : home with oxygen CODE STATUS: DNR--ACP goals discussed with pt and dter DVT Prophylaxis : Lovenox  CONSULTS OBTAINED:    DRUG ALLERGIES:  No Known Allergies  DISCHARGE MEDICATIONS:   Allergies as of 01/05/2019   No Known Allergies     Medication List    TAKE these medications   Advair Diskus 250-50 MCG/DOSE Aepb Generic drug: Fluticasone-Salmeterol Inhale 1 puff into the lungs 2 (two) times daily.   albuterol 108 (90 Base) MCG/ACT inhaler Commonly known as: VENTOLIN HFA Inhale 1-2 puffs into the lungs every 4 (four) hours as needed for wheezing or shortness of breath.   alendronate 70 MG tablet Commonly known as: FOSAMAX Take 70 mg by mouth once a week.   ALPRAZolam 0.25 MG tablet Commonly known as: XANAX Take 1 tablet (0.25 mg total) by mouth 2 (two) times daily as needed for anxiety.   docusate sodium 100 MG capsule Commonly known as: COLACE Take 100 mg by mouth daily.   ipratropium-albuterol 0.5-2.5 (3) MG/3ML Soln Commonly known as: DUONEB Take 3 mLs by nebulization every 4 (four) hours as needed.   lisinopril 20 MG tablet Commonly known as: ZESTRIL Take 20 mg by mouth daily.   montelukast 10 MG tablet Commonly known as: SINGULAIR Take 10 mg by mouth at bedtime.   oxyCODONE 5 MG immediate release tablet Commonly known as: Oxy IR/ROXICODONE Take 5 mg by  mouth 3 (three) times daily as needed for moderate pain or breakthrough pain.   predniSONE 10 MG tablet Commonly known as: DELTASONE Take 6 tablets (60 mg total) by mouth daily with breakfast. Take 60 mg daily--taper by 10 mg daily then stop Start  taking on: January 06, 2019   sertraline 50 MG tablet Commonly known as: ZOLOFT Take 50 mg by mouth daily.   simvastatin 20 MG tablet Commonly known as: ZOCOR Take 20 mg by mouth daily.   tiotropium 18 MCG inhalation capsule Commonly known as: SPIRIVA Place 1 capsule (18 mcg total) into inhaler and inhale daily.            Durable Medical Equipment  (From admission, onward)         Start     Ordered   01/05/19 1107  For home use only DME Nebulizer machine  Once    Question Answer Comment  Patient needs a nebulizer to treat with the following condition COPD (chronic obstructive pulmonary disease) ([place])   Length of Need Lifetime      01/05/19 1107   01/05/19 1106  DME Oxygen  Once    Question Answer Comment  Length of Need Lifetime   Mode or (Route) Nasal cannula   Liters per Minute 3   Frequency Continuous (stationary and portable oxygen unit needed)   Oxygen conserving device Yes   Oxygen delivery system Gas      01/05/19 1107   01/04/19 1107  For home use only DME Nebulizer machine  Once    Question Answer Comment  Patient needs a nebulizer to treat with the following condition Empyema of lung ([place])   Length of Need Lifetime      01/04/19 1106   01/04/19 1106  For home use only DME oxygen  Once    Question Answer Comment  Length of Need Lifetime   Mode or (Route) Nasal cannula   Liters per Minute 3   Frequency Continuous (stationary and portable oxygen unit needed)   Oxygen conserving device Yes   Oxygen delivery system Gas      01/04/19 1106          If you experience worsening of your admission symptoms, develop shortness of breath, life threatening emergency, suicidal or homicidal thoughts you must seek medical attention immediately by calling 911 or calling your MD immediately  if symptoms less severe.  You Must read complete instructions/literature along with all the possible adverse reactions/side effects for all the Medicines you take and that  have been prescribed to you. Take any new Medicines after you have completely understood and accept all the possible adverse reactions/side effects.   Please note  You were cared for by a hospitalist during your hospital stay. If you have any questions about your discharge medications or the care you received while you were in the hospital after you are discharged, you can call the unit and asked to speak with the hospitalist on call if the hospitalist that took care of you is not available. Once you are discharged, your primary care physician will handle any further medical issues. Please note that NO REFILLS for any discharge medications will be authorized once you are discharged, as it is imperative that you return to your primary care physician (or establish a relationship with a primary care physician if you do not have one) for your aftercare needs so that they can reassess your need for medications and monitor your lab values. Today   SUBJECTIVE  improving  VITAL SIGNS:  Blood pressure 128/89, pulse 87, temperature 98.2 F (36.8 C), temperature source Oral, resp. rate (!) 21, height 5\' 4"  (1.626 m), weight 68.3 kg, SpO2 95 %.  I/O:    Intake/Output Summary (Last 24 hours) at 01/05/2019 1112 Last data filed at 01/05/2019 0736 Gross per 24 hour  Intake -  Output 2000 ml  Net -2000 ml    PHYSICAL EXAMINATION:  GENERAL:  68 y.o.-year-old patient lying in the bed with no acute distress.  EYES: Pupils equal, round, reactive to light and accommodation. No scleral icterus. Extraocular muscles intact.  HEENT: Head atraumatic, normocephalic. Oropharynx and nasopharynx clear.  NECK:  Supple, no jugular venous distention. No thyroid enlargement, no tenderness.  LUNGS: Normal distant breath sounds bilaterally, no wheezing, rales,rhonchi or crepitation. No use of accessory muscles of respiration.  CARDIOVASCULAR: S1, S2 normal. No murmurs, rubs, or gallops.  ABDOMEN: Soft, non-tender,  non-distended. Bowel sounds present. No organomegaly or mass.  EXTREMITIES: No pedal edema, cyanosis, or clubbing.  NEUROLOGIC: Cranial nerves II through XII are intact. Muscle strength 5/5 in all extremities. Sensation intact. Gait not checked.  PSYCHIATRIC: The patient is alert and oriented x 3.  SKIN: No obvious rash, lesion, or ulcer.   DATA REVIEW:   CBC  Recent Labs  Lab 01/02/19 0612  WBC 6.9  HGB 12.8  HCT 40.8  PLT 177    Chemistries  Recent Labs  Lab 01/02/19 0612  NA 136  K 4.2  CL 101  CO2 27  GLUCOSE 133*  BUN 9  CREATININE 0.34*  CALCIUM 8.5*  AST 9*  ALT 7  ALKPHOS 41  BILITOT 0.7    Microbiology Results   Recent Results (from the past 240 hour(s))  SARS CORONAVIRUS 2 (TAT 6-24 HRS) Nasopharyngeal Nasopharyngeal Swab     Status: None   Collection Time: 01/01/19 10:59 PM   Specimen: Nasopharyngeal Swab  Result Value Ref Range Status   SARS Coronavirus 2 NEGATIVE NEGATIVE Final    Comment: (NOTE) SARS-CoV-2 target nucleic acids are NOT DETECTED. The SARS-CoV-2 RNA is generally detectable in upper and lower respiratory specimens during the acute phase of infection. Negative results do not preclude SARS-CoV-2 infection, do not rule out co-infections with other pathogens, and should not be used as the sole basis for treatment or other patient management decisions. Negative results must be combined with clinical observations, patient history, and epidemiological information. The expected result is Negative. Fact Sheet for Patients: SugarRoll.be Fact Sheet for Healthcare Providers: https://www.woods-mathews.com/ This test is not yet approved or cleared by the [place] FDA and  has been authorized for detection and/or diagnosis of SARS-CoV-2 by FDA under an Emergency Use Authorization (EUA). This EUA will remain  in effect (meaning this test can be used) for the duration of the COVID-19 declaration under  Section 56 4(b)(1) of the Act, 21 U.S.C. section 360bbb-3(b)(1), unless the authorization is terminated or revoked sooner. Performed at [facility], [place] [address], [place], [place] [address]     RADIOLOGY:  No results found.   CODE STATUS:     Code Status Orders  (From admission, onward)         Start     Ordered   01/04/19 0955  Do not attempt resuscitation (DNR)  Continuous    Question Answer Comment  In the event of cardiac or respiratory ARREST Do not call a "code blue"   In the event of cardiac or respiratory ARREST Do not perform  Intubation, CPR, defibrillation or ACLS   In the event of cardiac or respiratory ARREST Use medication by any route, position, wound care, and other measures to relive pain and suffering. May use oxygen, suction and manual treatment of airway obstruction as needed for comfort.   Comments discussed with pt and she wants to be DNR      01/04/19 0954        Code Status History    Date Active Date Inactive Code Status Order ID Comments User Context   01/01/2019 2103 01/04/2019 0954 Full Code [id number]  Jani Gravel, MD ED   Advance Care Planning Activity       TOTAL TIME TAKING CARE OF THIS PATIENT: **40* minutes.    Fritzi Mandes M.D on 01/05/2019 at 11:12 AM  Between 7am to 6pm - Pager - [phone] After 6pm go to [email or web address] - password [email or web address]  [facility]    CC: Primary care physician; Ellamae Sia, MD

## 2019-01-05 NOTE — Progress Notes (Signed)
   01/05/19 1200  Clinical Encounter Type  Visited With Patient  Visit Type Initial  Referral From Nurse  Consult/Referral To Chaplain  Spiritual Encounters  Spiritual Needs Brochure  Chaplain received OR for AD and visit patient and she was eating lunch. Patient explained that she did not need to complete AD her daughter and her talk and she knows and understands her wishes. Chaplain asked was there anything else that she can do for patient and patient said to keep her in prayer.

## 2019-01-05 NOTE — Discharge Instructions (Signed)
Pt advised not to smoke Use oxygen and nebs as per instrucitons

## 2019-01-11 ENCOUNTER — Emergency Department: Payer: Medicare HMO

## 2019-01-11 ENCOUNTER — Inpatient Hospital Stay
Admission: EM | Admit: 2019-01-11 | Discharge: 2019-01-13 | DRG: 368 | Disposition: A | Discharge status: Other Acute Inpatient Hospital | Payer: Medicare HMO | Attending: Pulmonary Disease | Admitting: Pulmonary Disease

## 2019-01-11 ENCOUNTER — Other Ambulatory Visit: Payer: Self-pay

## 2019-01-11 DIAGNOSIS — Z8249 Family history of ischemic heart disease and other diseases of the circulatory system: Secondary | ICD-10-CM | POA: Diagnosis not present

## 2019-01-11 DIAGNOSIS — F17210 Nicotine dependence, cigarettes, uncomplicated: Secondary | ICD-10-CM | POA: Diagnosis present

## 2019-01-11 DIAGNOSIS — R1011 Right upper quadrant pain: Secondary | ICD-10-CM

## 2019-01-11 DIAGNOSIS — Z85038 Personal history of other malignant neoplasm of large intestine: Secondary | ICD-10-CM | POA: Diagnosis not present

## 2019-01-11 DIAGNOSIS — K920 Hematemesis: Secondary | ICD-10-CM | POA: Diagnosis not present

## 2019-01-11 DIAGNOSIS — K2289 Other specified disease of esophagus: Secondary | ICD-10-CM

## 2019-01-11 DIAGNOSIS — J449 Chronic obstructive pulmonary disease, unspecified: Secondary | ICD-10-CM | POA: Diagnosis present

## 2019-01-11 DIAGNOSIS — Z79899 Other long term (current) drug therapy: Secondary | ICD-10-CM

## 2019-01-11 DIAGNOSIS — Z7951 Long term (current) use of inhaled steroids: Secondary | ICD-10-CM | POA: Diagnosis not present

## 2019-01-11 DIAGNOSIS — Z7983 Long term (current) use of bisphosphonates: Secondary | ICD-10-CM

## 2019-01-11 DIAGNOSIS — N189 Chronic kidney disease, unspecified: Secondary | ICD-10-CM | POA: Diagnosis present

## 2019-01-11 DIAGNOSIS — D649 Anemia, unspecified: Secondary | ICD-10-CM | POA: Diagnosis present

## 2019-01-11 DIAGNOSIS — I712 Thoracic aortic aneurysm, without rupture: Secondary | ICD-10-CM | POA: Diagnosis present

## 2019-01-11 DIAGNOSIS — K228 Other specified diseases of esophagus: Secondary | ICD-10-CM | POA: Diagnosis not present

## 2019-01-11 DIAGNOSIS — K449 Diaphragmatic hernia without obstruction or gangrene: Secondary | ICD-10-CM | POA: Diagnosis present

## 2019-01-11 DIAGNOSIS — R579 Shock, unspecified: Secondary | ICD-10-CM

## 2019-01-11 DIAGNOSIS — R578 Other shock: Secondary | ICD-10-CM | POA: Diagnosis present

## 2019-01-11 DIAGNOSIS — A419 Sepsis, unspecified organism: Secondary | ICD-10-CM

## 2019-01-11 DIAGNOSIS — I8501 Esophageal varices with bleeding: Principal | ICD-10-CM | POA: Diagnosis present

## 2019-01-11 DIAGNOSIS — Z20828 Contact with and (suspected) exposure to other viral communicable diseases: Secondary | ICD-10-CM | POA: Diagnosis present

## 2019-01-11 DIAGNOSIS — I714 Abdominal aortic aneurysm, without rupture: Secondary | ICD-10-CM | POA: Diagnosis present

## 2019-01-11 DIAGNOSIS — Z6825 Body mass index (BMI) 25.0-25.9, adult: Secondary | ICD-10-CM

## 2019-01-11 DIAGNOSIS — E440 Moderate protein-calorie malnutrition: Secondary | ICD-10-CM | POA: Diagnosis present

## 2019-01-11 DIAGNOSIS — D62 Acute posthemorrhagic anemia: Secondary | ICD-10-CM | POA: Diagnosis present

## 2019-01-11 DIAGNOSIS — D751 Secondary polycythemia: Secondary | ICD-10-CM | POA: Diagnosis present

## 2019-01-11 DIAGNOSIS — E785 Hyperlipidemia, unspecified: Secondary | ICD-10-CM | POA: Diagnosis present

## 2019-01-11 DIAGNOSIS — I129 Hypertensive chronic kidney disease with stage 1 through stage 4 chronic kidney disease, or unspecified chronic kidney disease: Secondary | ICD-10-CM | POA: Diagnosis present

## 2019-01-11 DIAGNOSIS — Z7982 Long term (current) use of aspirin: Secondary | ICD-10-CM

## 2019-01-11 DIAGNOSIS — E538 Deficiency of other specified B group vitamins: Secondary | ICD-10-CM | POA: Diagnosis present

## 2019-01-11 DIAGNOSIS — E042 Nontoxic multinodular goiter: Secondary | ICD-10-CM | POA: Diagnosis present

## 2019-01-11 DIAGNOSIS — K922 Gastrointestinal hemorrhage, unspecified: Secondary | ICD-10-CM

## 2019-01-11 DIAGNOSIS — R17 Unspecified jaundice: Secondary | ICD-10-CM

## 2019-01-11 HISTORY — DX: Essential (primary) hypertension: I10

## 2019-01-11 LAB — COMPREHENSIVE METABOLIC PANEL
ALT: 19 U/L (ref 0–44)
AST: 28 U/L (ref 15–41)
Albumin: 2.4 g/dL — ABNORMAL LOW (ref 3.5–5.0)
Alkaline Phosphatase: 36 U/L — ABNORMAL LOW (ref 38–126)
Anion gap: 10 (ref 5–15)
BUN: 28 mg/dL — ABNORMAL HIGH (ref 8–23)
CO2: 23 mmol/L (ref 22–32)
Calcium: 8 mg/dL — ABNORMAL LOW (ref 8.9–10.3)
Chloride: 100 mmol/L (ref 98–111)
Creatinine, Ser: 0.78 mg/dL (ref 0.44–1.00)
GFR calc Af Amer: >60 mL/min (ref >60)
GFR calc non Af Amer: >60 mL/min (ref >60)
Glucose, Bld: 137 mg/dL — ABNORMAL HIGH (ref 70–99)
Potassium: 5.1 mmol/L (ref 3.5–5.1)
Sodium: 133 mmol/L — ABNORMAL LOW (ref 135–145)
Total Bilirubin: 0.5 mg/dL (ref 0.3–1.2)
Total Protein: 4.9 g/dL — ABNORMAL LOW (ref 6.5–8.1)

## 2019-01-11 LAB — BILIRUBIN, FRACTIONATED(TOT/DIR/INDIR)
Bilirubin, Direct: <0.1 mg/dL (ref 0.0–0.2)
Total Bilirubin: 0.2 mg/dL — ABNORMAL LOW (ref 0.3–1.2)

## 2019-01-11 LAB — CBC WITH DIFFERENTIAL/PLATELET
Abs Immature Granulocytes: 1.03 10*3/uL — ABNORMAL HIGH (ref 0.00–0.07)
Abs Immature Granulocytes: 0.71 10*3/uL — ABNORMAL HIGH (ref 0.00–0.07)
Basophils Absolute: 0.1 10*3/uL (ref 0.0–0.1)
Basophils Absolute: 0 10*3/uL (ref 0.0–0.1)
Basophils Relative: 0 %
Basophils Relative: 0 %
Eosinophils Absolute: 0.2 10*3/uL (ref 0.0–0.5)
Eosinophils Absolute: 0.1 10*3/uL (ref 0.0–0.5)
Eosinophils Relative: 1 %
Eosinophils Relative: 0 %
HCT: 19 % — ABNORMAL LOW (ref 36.0–46.0)
HCT: 16.5 % — ABNORMAL LOW (ref 36.0–46.0)
Hemoglobin: 5.9 g/dL — ABNORMAL LOW (ref 12.0–15.0)
Hemoglobin: 5.3 g/dL — ABNORMAL LOW (ref 12.0–15.0)
Immature Granulocytes: 5 %
Immature Granulocytes: 4 %
Lymphocytes Relative: 12 %
Lymphocytes Relative: 10 %
Lymphs Abs: 2.7 10*3/uL (ref 0.7–4.0)
Lymphs Abs: 1.9 10*3/uL (ref 0.7–4.0)
MCH: 28.5 pg (ref 26.0–34.0)
MCH: 28.3 pg (ref 26.0–34.0)
MCHC: 31.1 g/dL (ref 30.0–36.0)
MCHC: 32.1 g/dL (ref 30.0–36.0)
MCV: 91.8 fL (ref 80.0–100.0)
MCV: 88.2 fL (ref 80.0–100.0)
Monocytes Absolute: 1.6 10*3/uL — ABNORMAL HIGH (ref 0.1–1.0)
Monocytes Absolute: 1 10*3/uL (ref 0.1–1.0)
Monocytes Relative: 8 %
Monocytes Relative: 6 %
Neutro Abs: 16 10*3/uL — ABNORMAL HIGH (ref 1.7–7.7)
Neutro Abs: 14.5 10*3/uL — ABNORMAL HIGH (ref 1.7–7.7)
Neutrophils Relative %: 74 %
Neutrophils Relative %: 80 %
Platelets: 376 10*3/uL (ref 150–400)
Platelets: 304 10*3/uL (ref 150–400)
RBC: 2.07 MIL/uL — ABNORMAL LOW (ref 3.87–5.11)
RBC: 1.87 MIL/uL — ABNORMAL LOW (ref 3.87–5.11)
RDW: 16.1 % — ABNORMAL HIGH (ref 11.5–15.5)
RDW: 16.1 % — ABNORMAL HIGH (ref 11.5–15.5)
WBC: 21.6 10*3/uL — ABNORMAL HIGH (ref 4.0–10.5)
WBC: 18.3 10*3/uL — ABNORMAL HIGH (ref 4.0–10.5)
nRBC: 0 % (ref 0.0–0.2)
nRBC: 0 % (ref 0.0–0.2)

## 2019-01-11 LAB — FIBRIN DERIVATIVES D-DIMER (ARMC ONLY): Fibrin derivatives D-dimer (ARMC): 377.63 ng/mL (FEU) (ref 0.00–499.00)

## 2019-01-11 LAB — RETICULOCYTES
Immature Retic Fract: 31.5 % — ABNORMAL HIGH (ref 2.3–15.9)
RBC.: 1.87 MIL/uL — ABNORMAL LOW (ref 3.87–5.11)
Retic Count, Absolute: 89.8 10*3/uL (ref 19.0–186.0)
Retic Ct Pct: 4.8 % — ABNORMAL HIGH (ref 0.4–3.1)

## 2019-01-11 LAB — LACTIC ACID, PLASMA
Lactic Acid, Venous: 4.1 mmol/L (ref 0.5–1.9)
Lactic Acid, Venous: 1 mmol/L (ref 0.5–1.9)

## 2019-01-11 LAB — TECHNOLOGIST SMEAR REVIEW

## 2019-01-11 LAB — APTT: aPTT: 27 seconds (ref 24–36)

## 2019-01-11 LAB — PROTIME-INR
INR: 1.1 (ref 0.8–1.2)
Prothrombin Time: 13.6 seconds (ref 11.4–15.2)

## 2019-01-11 LAB — LIPASE, BLOOD: Lipase: 34 U/L (ref 11–51)

## 2019-01-11 MED ORDER — SODIUM CHLORIDE 0.9 % IV SOLN
INTRAVENOUS | Status: DC
  Administered 2019-01-12 (×2): via INTRAVENOUS

## 2019-01-11 MED ORDER — NOREPINEPHRINE 4 MG/250ML-% IV SOLN
0.0000 ug/min | INTRAVENOUS | Status: DC
  Filled 2019-01-11: qty 250

## 2019-01-11 MED ORDER — METRONIDAZOLE IN NACL 5-0.79 MG/ML-% IV SOLN
500.0000 mg | Freq: Once | INTRAVENOUS | Status: AC
  Administered 2019-01-11: 500 mg via INTRAVENOUS
  Filled 2019-01-11: qty 100

## 2019-01-11 MED ORDER — IOHEXOL 300 MG/ML  SOLN
100.0000 mL | Freq: Once | INTRAMUSCULAR | Status: AC | PRN
  Administered 2019-01-11: 22:00:00 100 mL via INTRAVENOUS

## 2019-01-11 MED ORDER — SODIUM CHLORIDE 0.9 % IV SOLN
1.0000 g | Freq: Once | INTRAVENOUS | Status: AC
  Administered 2019-01-11: 1 g via INTRAVENOUS
  Filled 2019-01-11: qty 1

## 2019-01-11 MED ORDER — IOHEXOL 9 MG/ML PO SOLN
500.0000 mL | Freq: Once | ORAL | Status: DC | PRN
  Administered 2019-01-11: 21:00:00 500 mL via ORAL
  Filled 2019-01-11: qty 500

## 2019-01-11 NOTE — ED Notes (Signed)
Patient transported to CT 

## 2019-01-11 NOTE — ED Notes (Signed)
US at bedside

## 2019-01-11 NOTE — ED Notes (Signed)
Pt back from CT. Placed back on monitor.  

## 2019-01-11 NOTE — ED Provider Notes (Addendum)
[facility] Emergency Department Provider Note   ____________________________________________   First MD Initiated Contact with Patient 01/11/19 1931     (approximate)  I have reviewed the triage vital signs and the nursing notes.   HISTORY  Chief Complaint Fall    HPI Alicia Holder is a 68 y.o. female patient reports she got weak and fell.  She was recently hospitalized for COPD.  She has intermittent right upper quadrant pain and has had that for some time.  She did not notice that she is jaundiced.  She denies any fever coughing         Past Medical History:  Diagnosis Date  . AAA (abdominal aortic aneurysm) ([place])   . Colon cancer ([place])   . Hypertension     Patient Active Problem List   Diagnosis Date Noted  . Essential hypertension   . Hyperlipidemia   . Tobacco abuse counseling   . COPD with acute exacerbation ([place]) 01/01/2019  . Acute respiratory failure with hypoxia ([place]) 01/01/2019  . COPD exacerbation ([place]) 01/01/2019  . Goals of care, counseling/discussion 05/10/2017  . Aortic aneurysm, thoracic ([place]) 03/30/2017  . Thyroid nodule 03/30/2017  . 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 ABDOMINAL HYSTERECTOMY    . APPENDECTOMY      Prior to Admission medications   Medication Sig Start Date End Date Taking? Authorizing Provider  ADVAIR DISKUS 250-50 MCG/DOSE AEPB Inhale 1 puff into the lungs 2 (two) times daily. 11/14/16   [provider]  albuterol (VENTOLIN HFA) 108 (90 Base) MCG/ACT inhaler Inhale 1-2 puffs into the lungs every 4 (four) hours as needed for wheezing or shortness of breath.  07/09/18   [provider]  alendronate (FOSAMAX) 70 MG tablet Take 70 mg by mouth once a week. 07/26/17   [provider]  ALPRAZolam Duanne Moron) 0.25 MG tablet Take 1 tablet (0.25 mg total) by mouth 2 (two) times daily as needed for anxiety. 01/05/19   Fritzi Mandes,  MD  docusate sodium (COLACE) 100 MG capsule Take 100 mg by mouth daily.    [provider]  ipratropium-albuterol (DUONEB) 0.5-2.5 (3) MG/3ML SOLN Take 3 mLs by nebulization every 4 (four) hours as needed. 01/05/19   Fritzi Mandes, MD  lisinopril (ZESTRIL) 20 MG tablet Take 20 mg by mouth daily.     [provider]  montelukast (SINGULAIR) 10 MG tablet Take 10 mg by mouth at bedtime. 11/14/16   [provider]  oxyCODONE (OXY IR/ROXICODONE) 5 MG immediate release tablet Take 5 mg by mouth 3 (three) times daily as needed for moderate pain or breakthrough pain.     [provider]  predniSONE (DELTASONE) 10 MG tablet Take 6 tablets (60 mg total) by mouth daily with breakfast. Take 60 mg daily--taper by 10 mg daily then stop 01/06/19   Fritzi Mandes, MD  sertraline (ZOLOFT) 50 MG tablet Take 50 mg by mouth daily. 11/14/16   [provider]  simvastatin (ZOCOR) 20 MG tablet Take 20 mg by mouth daily. 11/14/16   [provider]  tiotropium (SPIRIVA) 18 MCG inhalation capsule Place 1 capsule (18 mcg total) into inhaler and inhale daily. 01/05/19   Fritzi Mandes, MD    Allergies Patient has no known allergies.  Family History  Problem Relation Age of Onset  . CAD Mother   . CAD Father     Social History Social History   Tobacco Use  .  Smoking status: Current Some Day Smoker    Packs/day: 1.00    Years: 41.00    Pack years: 41.00  . Smokeless tobacco: Never Used  Substance Use Topics  . Alcohol use: Yes    Comment: Once every 3 weeks  . Drug use: Not on file    Review of Systems  Constitutional: No fever/chills Eyes: No visual changes. ENT: No sore throat. Cardiovascular: Denies chest pain. Respiratory: Denies shortness of breath. Gastrointestinal: Right upper quadrant abdominal pain.  No nausea, no vomiting.  No diarrhea.  No constipation. Genitourinary: Negative for dysuria. Musculoskeletal: Negative for back pain. Skin: Negative  for rash.  She is jaundiced Neurological: Negative for headaches, focal weakness   ____________________________________________   PHYSICAL EXAM:  VITAL SIGNS: ED Triage Vitals  Enc Vitals Group     BP 01/11/19 1928 (!) 65/50     Pulse Rate 01/11/19 1928 99     Resp 01/11/19 1928 (!) 33     Temp 01/11/19 1928 (!) 97.5 F (36.4 C)     Temp Source 01/11/19 1928 Oral     SpO2 01/11/19 1928 100 %     Weight 01/11/19 1930 149 lb 14.6 oz (68 kg)     Height 01/11/19 1930 5\' 4"  (1.626 m)     Head Circumference --      Peak Flow --      Pain Score 01/11/19 1930 3     Pain Loc --      Pain Edu? --      Excl. in [place]? --     Constitutional: Alert and oriented. Well appearing and in no acute distress. Eyes: Conjunctivae are normal but jaundiced.  Head: Atraumatic. Nose: No congestion/rhinnorhea. Mouth/Throat: Mucous membranes are moist.  Oropharynx non-erythematous. Neck: No stridor.  Cardiovascular: Normal rate, regular rhythm. Grossly normal heart sounds.  Good peripheral circulation. Respiratory: Normal respiratory effort.  No retractions. Lungs CTAB. Gastrointestinal: Soft and nontender except for in the right upper quadrant to palpation. No distention. No abdominal bruits. No CVA tenderness. Musculoskeletal: No lower extremity tenderness some bilateral edema.  Neurologic:  Normal speech and language. No gross focal neurologic deficits are appreciated. . Skin:  Skin is warm, dry and intact. No rash noted jaundiced.   ____________________________________________   LABS (all labs ordered are listed, but only abnormal results are displayed)  Labs Reviewed  COMPREHENSIVE METABOLIC PANEL - Abnormal; Notable for the following components:      Result Value   Sodium 133 (*)    Glucose, Bld 137 (*)    BUN 28 (*)    Calcium 8.0 (*)    Total Protein 4.9 (*)    Albumin 2.4 (*)    Alkaline Phosphatase 36 (*)    All other components within normal limits  LACTIC ACID, PLASMA -  Abnormal; Notable for the following components:   Lactic Acid, Venous 4.1 (*)    All other components within normal limits  CBC WITH DIFFERENTIAL/PLATELET - Abnormal; Notable for the following components:   WBC 21.6 (*)    RBC 2.07 (*)    Hemoglobin 5.9 (*)    HCT 19.0 (*)    RDW 16.1 (*)    Neutro Abs 16.0 (*)    Monocytes Absolute 1.6 (*)    Abs Immature Granulocytes 1.03 (*)    All other components within normal limits  CULTURE, BLOOD (ROUTINE X 2)  CULTURE, BLOOD (ROUTINE X 2)  SARS CORONAVIRUS 2 (TAT 6-24 HRS)  PROTIME-INR  LACTIC ACID, PLASMA  URINALYSIS, COMPLETE (UACMP) WITH MICROSCOPIC  TYPE AND SCREEN   ____________________________________________  EKG   ____________________________________________  RADIOLOGY  ED MD interpretation: Ultrasound read as gallstones but nothing acute  Official radiology report(s): DG Chest Port 1 View  Result Date: 01/11/2019 CLINICAL DATA:  Syncopal episode. Sepsis. EXAM: PORTABLE CHEST 1 VIEW COMPARISON:  January 01, 2019 FINDINGS: The heart size is stable from prior study. Aortic calcifications are noted. The thoracic aorta is tortuous. Hiatal hernia is again noted. There may be trace bilateral pleural effusions. There is an old fracture of the proximal left humerus. There are multiple old healed left-sided rib fractures. There is no pneumothorax. No large pleural effusion. IMPRESSION: 1. Possible trace bilateral pleural effusions. 2. Hiatal hernia. 3. No acute cardiopulmonary process. 4. Stable old left-sided rib fractures. Old left proximal humerus fracture. Electronically Signed   By: Constance Holster M.D.   On: 01/11/2019 19:54   US Abdomen Limited RUQ  Result Date: 01/11/2019 CLINICAL DATA:  Right upper quadrant pain and jaundice EXAM: ULTRASOUND ABDOMEN LIMITED RIGHT UPPER QUADRANT COMPARISON:  None. FINDINGS: Gallbladder: Gallbladder is well distended with evidence of gallstone within. No wall thickening or pericholecystic  fluid is noted. Phrygian cap is seen with a large stone. Common bile duct: Diameter: 5 mm Liver: No focal lesion identified. Within normal limits in parenchymal echogenicity. Portal vein is patent on color Doppler imaging with normal direction of blood flow towards the liver. Other: None. IMPRESSION: Cholelithiasis without complicating factors. A Phrygian cap is seen with a stone noted within. Electronically Signed   By: Inez Catalina M.D.   On: 01/11/2019 20:39    ____________________________________________   PROCEDURES  Procedure(s) performed (including Critical Care): Critical care time 1 hour.  This includes speaking to the hospitalist and GI and heme-onc and spending some time at the bedside as well.  Procedures   ____________________________________________   INITIAL IMPRESSION / ASSESSMENT AND PLAN / ED COURSE Patient is jaundiced and anemic hypotensive with a high white blood count.  She has multiple immature granulocytes.  She is septic I cannot find the source there is nothing on her chest x-ray or ultrasound.  She is not quite stable enough to get a CT at this point.  We will continue antibiotics and fluids and try and get her blood pressure a little higher she will need another lactic acid as well.              ____________________________________________   FINAL CLINICAL IMPRESSION(S) / ED DIAGNOSES  Final diagnoses:  Sepsis ([place])  RUQ pain  Jaundice     ED Discharge Orders    None       Note:  This document was prepared using Dragon voice recognition software and may include unintentional dictation errors.    Nena Polio, MD 01/11/19 2110    Nena Polio, MD 01/20/19 [phone]

## 2019-01-11 NOTE — ED Notes (Signed)
Lab called. Will add on retic count.

## 2019-01-11 NOTE — ED Triage Notes (Signed)
Pt arrives from home via EMS with cc of syncopal episode caused from dizziness. Pt denies LOC. Pt has bruising and small skin abrasions on right elbow. Pt denies any recent falls besides today. Pt states she takes medicine for hypertension.   Pt arrives with BP 74/50 P 104 O2 94% 3L R26  Pt has hx hypertension and "blood" cancer

## 2019-01-11 NOTE — ED Notes (Signed)
Date and time results received: 01/11/19 8:14 PM  (use smartphrase ".now" to insert current time)  Test: lacitc Critical Value: 4.1  Name of Provider Notified: Cinda Quest, MD

## 2019-01-12 ENCOUNTER — Encounter: Payer: Self-pay | Admitting: Internal Medicine

## 2019-01-12 ENCOUNTER — Inpatient Hospital Stay: Payer: Medicare HMO | Admitting: Anesthesiology

## 2019-01-12 ENCOUNTER — Encounter
Admission: EM | Disposition: A | Discharge status: Other Acute Inpatient Hospital | Payer: Self-pay | Source: Home / Self Care | Attending: Pulmonary Disease

## 2019-01-12 DIAGNOSIS — K228 Other specified diseases of esophagus: Secondary | ICD-10-CM

## 2019-01-12 DIAGNOSIS — R578 Other shock: Secondary | ICD-10-CM | POA: Diagnosis not present

## 2019-01-12 DIAGNOSIS — D649 Anemia, unspecified: Secondary | ICD-10-CM

## 2019-01-12 DIAGNOSIS — E538 Deficiency of other specified B group vitamins: Secondary | ICD-10-CM

## 2019-01-12 DIAGNOSIS — K922 Gastrointestinal hemorrhage, unspecified: Secondary | ICD-10-CM

## 2019-01-12 DIAGNOSIS — K2289 Other specified disease of esophagus: Secondary | ICD-10-CM

## 2019-01-12 DIAGNOSIS — K920 Hematemesis: Secondary | ICD-10-CM

## 2019-01-12 HISTORY — PX: ESOPHAGOGASTRODUODENOSCOPY: SHX5428

## 2019-01-12 LAB — SARS CORONAVIRUS 2 (TAT 6-24 HRS): SARS Coronavirus 2: NEGATIVE

## 2019-01-12 LAB — HEMOGLOBIN AND HEMATOCRIT, BLOOD
HCT: 21.5 % — ABNORMAL LOW (ref 36.0–46.0)
HCT: 27.7 % — ABNORMAL LOW (ref 36.0–46.0)
Hemoglobin: 7 g/dL — ABNORMAL LOW (ref 12.0–15.0)
Hemoglobin: 9.1 g/dL — ABNORMAL LOW (ref 12.0–15.0)

## 2019-01-12 LAB — URINALYSIS, COMPLETE (UACMP) WITH MICROSCOPIC
Bilirubin Urine: NEGATIVE
Glucose, UA: NEGATIVE mg/dL
Ketones, ur: 5 mg/dL — AB
Leukocytes,Ua: NEGATIVE
Nitrite: NEGATIVE
Protein, ur: NEGATIVE mg/dL
Specific Gravity, Urine: >1.046 — ABNORMAL HIGH (ref 1.005–1.030)
pH: 6 (ref 5.0–8.0)

## 2019-01-12 LAB — COMPREHENSIVE METABOLIC PANEL
ALT: 17 U/L (ref 0–44)
ALT: 16 U/L (ref 0–44)
AST: 19 U/L (ref 15–41)
AST: 24 U/L (ref 15–41)
Albumin: 2.3 g/dL — ABNORMAL LOW (ref 3.5–5.0)
Albumin: 2 g/dL — ABNORMAL LOW (ref 3.5–5.0)
Alkaline Phosphatase: 33 U/L — ABNORMAL LOW (ref 38–126)
Alkaline Phosphatase: 28 U/L — ABNORMAL LOW (ref 38–126)
Anion gap: 5 (ref 5–15)
Anion gap: 5 (ref 5–15)
BUN: 25 mg/dL — ABNORMAL HIGH (ref 8–23)
BUN: 20 mg/dL (ref 8–23)
CO2: 23 mmol/L (ref 22–32)
CO2: 23 mmol/L (ref 22–32)
Calcium: 7 mg/dL — ABNORMAL LOW (ref 8.9–10.3)
Calcium: 7 mg/dL — ABNORMAL LOW (ref 8.9–10.3)
Chloride: 102 mmol/L (ref 98–111)
Chloride: 104 mmol/L (ref 98–111)
Creatinine, Ser: 0.49 mg/dL (ref 0.44–1.00)
Creatinine, Ser: 0.37 mg/dL — ABNORMAL LOW (ref 0.44–1.00)
GFR calc Af Amer: >60 mL/min (ref >60)
GFR calc Af Amer: >60 mL/min (ref >60)
GFR calc non Af Amer: >60 mL/min (ref >60)
GFR calc non Af Amer: >60 mL/min (ref >60)
Glucose, Bld: 111 mg/dL — ABNORMAL HIGH (ref 70–99)
Glucose, Bld: 94 mg/dL (ref 70–99)
Potassium: 3.7 mmol/L (ref 3.5–5.1)
Potassium: 3.5 mmol/L (ref 3.5–5.1)
Sodium: 130 mmol/L — ABNORMAL LOW (ref 135–145)
Sodium: 132 mmol/L — ABNORMAL LOW (ref 135–145)
Total Bilirubin: 0.2 mg/dL — ABNORMAL LOW (ref 0.3–1.2)
Total Bilirubin: 0.4 mg/dL (ref 0.3–1.2)
Total Protein: 4.8 g/dL — ABNORMAL LOW (ref 6.5–8.1)
Total Protein: 4.3 g/dL — ABNORMAL LOW (ref 6.5–8.1)

## 2019-01-12 LAB — CBC WITH DIFFERENTIAL/PLATELET
Abs Immature Granulocytes: 0.55 10*3/uL — ABNORMAL HIGH (ref 0.00–0.07)
Basophils Absolute: 0 10*3/uL (ref 0.0–0.1)
Basophils Relative: 0 %
Eosinophils Absolute: 0.1 10*3/uL (ref 0.0–0.5)
Eosinophils Relative: 1 %
HCT: 25.7 % — ABNORMAL LOW (ref 36.0–46.0)
Hemoglobin: 8.8 g/dL — ABNORMAL LOW (ref 12.0–15.0)
Immature Granulocytes: 4 %
Lymphocytes Relative: 13 %
Lymphs Abs: 1.7 10*3/uL (ref 0.7–4.0)
MCH: 30.1 pg (ref 26.0–34.0)
MCHC: 34.2 g/dL (ref 30.0–36.0)
MCV: 88 fL (ref 80.0–100.0)
Monocytes Absolute: 0.9 10*3/uL (ref 0.1–1.0)
Monocytes Relative: 7 %
Neutro Abs: 9.9 10*3/uL — ABNORMAL HIGH (ref 1.7–7.7)
Neutrophils Relative %: 75 %
Platelets: 201 10*3/uL (ref 150–400)
RBC: 2.92 MIL/uL — ABNORMAL LOW (ref 3.87–5.11)
RDW: 14.8 % (ref 11.5–15.5)
WBC: 13.2 10*3/uL — ABNORMAL HIGH (ref 4.0–10.5)
nRBC: 0.2 % (ref 0.0–0.2)

## 2019-01-12 LAB — IRON AND TIBC
Iron: 32 ug/dL (ref 28–170)
Saturation Ratios: 12 % (ref 10.4–31.8)
TIBC: 279 ug/dL (ref 250–450)
UIBC: 247 ug/dL

## 2019-01-12 LAB — FERRITIN: Ferritin: 26 ng/mL (ref 11–307)

## 2019-01-12 LAB — CBC
HCT: 18.5 % — ABNORMAL LOW (ref 36.0–46.0)
Hemoglobin: 6.1 g/dL — ABNORMAL LOW (ref 12.0–15.0)
MCH: 29 pg (ref 26.0–34.0)
MCHC: 33 g/dL (ref 30.0–36.0)
MCV: 88.1 fL (ref 80.0–100.0)
Platelets: 271 10*3/uL (ref 150–400)
RBC: 2.1 MIL/uL — ABNORMAL LOW (ref 3.87–5.11)
RDW: 15.3 % (ref 11.5–15.5)
WBC: 13.8 10*3/uL — ABNORMAL HIGH (ref 4.0–10.5)
nRBC: 0 % (ref 0.0–0.2)

## 2019-01-12 LAB — PROTIME-INR
INR: 1.1 (ref 0.8–1.2)
Prothrombin Time: 13.8 seconds (ref 11.4–15.2)

## 2019-01-12 LAB — PREPARE RBC (CROSSMATCH)

## 2019-01-12 LAB — FOLATE: Folate: 10.3 ng/mL (ref >5.9)

## 2019-01-12 LAB — APTT: aPTT: 29 seconds (ref 24–36)

## 2019-01-12 LAB — LACTATE DEHYDROGENASE: LDH: 55 U/L — ABNORMAL LOW (ref 98–192)

## 2019-01-12 LAB — DAT, POLYSPECIFIC AHG (ARMC ONLY): Polyspecific AHG test: NEGATIVE

## 2019-01-12 LAB — VITAMIN B12: Vitamin B-12: 173 pg/mL — ABNORMAL LOW (ref 180–914)

## 2019-01-12 SURGERY — EGD (ESOPHAGOGASTRODUODENOSCOPY)
Anesthesia: General

## 2019-01-12 MED ORDER — ORAL CARE MOUTH RINSE
15.0000 mL | Freq: Two times a day (BID) | OROMUCOSAL | Status: DC
  Administered 2019-01-12: 22:00:00 15 mL via OROMUCOSAL

## 2019-01-12 MED ORDER — PROPOFOL 10 MG/ML IV BOLUS
INTRAVENOUS | Status: DC | PRN
  Administered 2019-01-12: 30 ug via INTRAVENOUS
  Administered 2019-01-12: 200 ug/kg/min via INTRAVENOUS

## 2019-01-12 MED ORDER — LIDOCAINE HCL (CARDIAC) PF 100 MG/5ML IV SOSY: PREFILLED_SYRINGE | INTRAVENOUS | Status: DC | PRN

## 2019-01-12 MED ORDER — SODIUM CHLORIDE 0.9 % IV SOLN
1.0000 g | INTRAVENOUS | Status: DC
  Filled 2019-01-12: qty 1

## 2019-01-12 MED ORDER — SODIUM CHLORIDE 0.9 % IV BOLUS
1000.0000 mL | Freq: Once | INTRAVENOUS | Status: AC
  Administered 2019-01-12: 12:00:00 1000 mL via INTRAVENOUS

## 2019-01-12 MED ORDER — PHENYLEPHRINE HCL (PRESSORS) 10 MG/ML IV SOLN
INTRAVENOUS | Status: AC
  Filled 2019-01-12: qty 2

## 2019-01-12 MED ORDER — SODIUM CHLORIDE 0.9 % IV SOLN
80.0000 mg | Freq: Once | INTRAVENOUS | Status: AC
  Administered 2019-01-12: 80 mg via INTRAVENOUS
  Filled 2019-01-12: qty 80

## 2019-01-12 MED ORDER — PROPOFOL 500 MG/50ML IV EMUL
INTRAVENOUS | Status: AC
  Filled 2019-01-12: qty 50

## 2019-01-12 MED ORDER — SODIUM CHLORIDE 0.9 % IV SOLN
8.0000 mg/h | INTRAVENOUS | Status: DC
  Administered 2019-01-12 (×2): 8 mg/h via INTRAVENOUS
  Filled 2019-01-12 (×2): qty 80

## 2019-01-12 MED ORDER — EPINEPHRINE PF 1 MG/ML IJ SOLN
INTRAMUSCULAR | Status: AC
  Filled 2019-01-12: qty 1

## 2019-01-12 MED ORDER — PANTOPRAZOLE SODIUM 40 MG IV SOLR: 40.0000 mg | Freq: Two times a day (BID) | INTRAVENOUS | Status: DC

## 2019-01-12 MED ORDER — LIDOCAINE HCL (CARDIAC) PF 100 MG/5ML IV SOSY
PREFILLED_SYRINGE | INTRAVENOUS | Status: DC | PRN
  Administered 2019-01-12: 140 mg via INTRAVENOUS

## 2019-01-12 MED ORDER — SODIUM CHLORIDE 0.9 % IV SOLN
INTRAVENOUS | Status: DC | PRN
  Administered 2019-01-12: 100 ug via INTRAVENOUS
  Administered 2019-01-12: 200 ug via INTRAVENOUS
  Administered 2019-01-12: 20 ug/min via INTRAVENOUS
  Administered 2019-01-12: 100 ug via INTRAVENOUS

## 2019-01-12 MED ORDER — SODIUM CHLORIDE 0.9% IV SOLUTION
Freq: Once | INTRAVENOUS | Status: AC
  Administered 2019-01-12: 03:00:00 via INTRAVENOUS
  Filled 2019-01-12: qty 250

## 2019-01-12 MED ORDER — SODIUM CHLORIDE 0.9 % IV SOLN
0.0000 ug/min | INTRAVENOUS | Status: DC
  Filled 2019-01-12: qty 4

## 2019-01-12 MED ORDER — VASOPRESSIN 20 UNIT/ML IV SOLN
INTRAVENOUS | Status: DC | PRN
  Administered 2019-01-12 (×4): .5 [IU] via INTRAVENOUS
  Administered 2019-01-12: 1 [IU] via INTRAVENOUS
  Administered 2019-01-12: .5 [IU] via INTRAVENOUS
  Administered 2019-01-12: 1 [IU] via INTRAVENOUS

## 2019-01-12 MED ORDER — VASOPRESSIN 20 UNIT/ML IV SOLN
INTRAVENOUS | Status: AC
  Filled 2019-01-12: qty 1

## 2019-01-12 MED ORDER — SODIUM CHLORIDE 0.9% IV SOLUTION
Freq: Once | INTRAVENOUS | Status: AC
  Administered 2019-01-12: 10:00:00 via INTRAVENOUS
  Filled 2019-01-12: qty 250

## 2019-01-12 MED ORDER — LIDOCAINE HCL (PF) 2 % IJ SOLN
INTRAMUSCULAR | Status: AC
  Filled 2019-01-12: qty 10

## 2019-01-12 MED ORDER — SODIUM CHLORIDE 0.9 % IV SOLN
1.0000 g | INTRAVENOUS | Status: DC
  Administered 2019-01-12: 16:00:00 1 g via INTRAVENOUS
  Filled 2019-01-12: qty 1

## 2019-01-12 MED ORDER — SODIUM CHLORIDE 0.9 % IV SOLN
2.0000 g | Freq: Two times a day (BID) | INTRAVENOUS | Status: DC
  Filled 2019-01-12 (×2): qty 2

## 2019-01-12 MED ORDER — PROPOFOL 10 MG/ML IV BOLUS
INTRAVENOUS | Status: AC
  Filled 2019-01-12: qty 20

## 2019-01-12 MED ORDER — MORPHINE SULFATE (PF) 2 MG/ML IV SOLN: 2.0000 mg | INTRAVENOUS | Status: DC | PRN

## 2019-01-12 MED ORDER — SODIUM CHLORIDE 0.9% IV SOLUTION
Freq: Once | INTRAVENOUS | Status: DC
  Filled 2019-01-12: qty 250

## 2019-01-12 MED ORDER — SODIUM CHLORIDE 0.9 % IV SOLN: INTRAVENOUS | Status: DC

## 2019-01-12 MED ORDER — CYANOCOBALAMIN 1000 MCG/ML IJ SOLN
1000.0000 ug | Freq: Every day | INTRAMUSCULAR | Status: DC
  Filled 2019-01-12: qty 1

## 2019-01-12 MED ORDER — SODIUM CHLORIDE 0.9% IV SOLUTION: Freq: Once | INTRAVENOUS | Status: DC

## 2019-01-12 MED ORDER — NOREPINEPHRINE BITARTRATE 1 MG/ML IV SOLN
0.0000 ug/min | INTRAVENOUS | Status: DC
  Filled 2019-01-12: qty 4

## 2019-01-12 MED ORDER — CHLORHEXIDINE GLUCONATE CLOTH 2 % EX PADS
6.0000 | MEDICATED_PAD | Freq: Every day | CUTANEOUS | Status: DC
  Administered 2019-01-12: 16:00:00 6 via TOPICAL

## 2019-01-12 NOTE — Op Note (Addendum)
[facility]astroenterology Patient Name: Alicia Holder Procedure Date: 01/12/2019 12:29 PM MRN: [id number] Account #: [id number] Date of Birth: 12/21/1950 Admit Type: Inpatient Age: 68 Room: [facility] ENDO ROOM 4 Gender: Female Note Status: Finalized Procedure:             Upper GI endoscopy Indications:           Acute post hemorrhagic anemia, Coffee-ground emesis Providers:             Aaira Oestreicher B. Bonna Gains MD, MD Referring MD:          Remus Blake MD, MD (Referring MD) Medicines:             Monitored Anesthesia Care Complications:         No immediate complications. Procedure:             Pre-Anesthesia Assessment:                        - The risks and benefits of the procedure and the                         sedation options and risks were discussed with the                         patient. All questions were answered and informed                         consent was obtained.                        - Patient identification and proposed procedure were                         verified prior to the procedure.                        - ASA Grade Assessment: IV - A patient with severe                         systemic disease that is a constant threat to life.                        After obtaining informed consent, the endoscope was                         passed under direct vision. Throughout the procedure,                         the patient's blood pressure, pulse, and oxygen                         saturations were monitored continuously. The Endoscope                         was introduced through the mouth, and advanced to the                         second part of duodenum. The upper GI endoscopy was  accomplished with ease. The patient tolerated the                         procedure well. Findings:      Red blood was found in the distal esophagus. To stop active bleeding,       hemostatic spray was deployed. There was no  bleeding at the end of the       procedure.      Clotted blood was found in the gastric fundus. The scope was withdrawn       and replaced with the dual channel endoscope because of bleeding and       because of poor visibility. To stop active bleeding, hemostatic spray       was deployed. There was no bleeding at the end of the procedure.      The examined duodenum was normal. Impression:            - Red blood was seen in the distal esophagus. The area                         underneath the bleed could not be examined and a                         single culprit lesion could not be identified despite                         trying to clear the area with water and suctioning.                         Large blood clots were seen in the gastric fundus.                         These could not be removed despite changing to a                         therapeutic endoscope to improve suctioning. The area                         underneath could not be examined. The likely area of                         the bleed is the distal esopaghus. A decision was thus                         made to treat both the fundus and distal esophagus                         with hemospray. This lead to stopping of the active                         bleeding at the end of the procedure.                        - Red blood in the distal esophagus. Hemostatic spray                         applied.                        -  Clotted blood in the gastric fundus. Hemostatic                         spray applied.                        - Normal examined duodenum.                        - No specimens collected. Recommendation:        - Return patient to ICU for ongoing care.                        - Refer to Vascular and General surgery today. If pt                         rebleeds, please page them directly. I have paged both                         services (informed Dr. Lorenso Courier, Dr. Sandrea Matte, Dr. Earleen Newport,                          Dr Mal Misty) and informed them about the patient and plan                         of care.                        - NPO.                        - Continue Serial CBCs and transfuse PRN                        - Follow an antireflux regimen.                        - Low threshold for intubation if patient has active                         emesis. Keep head of bed elevated to 45 degrees to                         prevent aspiration of stomach contents.                        - Use Protonix (pantoprazole) 40 mg IV BID.                        - Possible repeat EGD in 1-2 days to reassess                         esophagus and stomach                        - The findings and recommendations were discussed with                         the patient.                        -  The findings and recommendations were discussed with                         the patient's family. Procedure Code(s):     --- Professional ---                        [phone], Esophagogastroduodenoscopy, flexible,                         transoral; with control of bleeding, any method Diagnosis Code(s):     --- Professional ---                        K22.8, Other specified diseases of esophagus                        K92.2, Gastrointestinal hemorrhage, unspecified                        D62, Acute posthemorrhagic anemia                        K92.0, Hematemesis CPT copyright 2019 American [employer] Association. All rights reserved. The codes documented in this report are preliminary and upon coder review may  be revised to meet current compliance requirements.  Vonda Antigua, MD Margretta Sidle B. Bonna Gains MD, MD 01/12/2019 1:48:00 PM This report has been signed electronically. Number of Addenda: 0 Note Initiated On: 01/12/2019 12:29 PM      [facility]

## 2019-01-12 NOTE — ED Notes (Signed)
Pt transferred to room 17 due to increase in bleeding.  Pt noted to be pale and diaphoretic.  Blood infusing at this time, pt

## 2019-01-12 NOTE — ED Provider Notes (Signed)
I was notified by charge nurse of patient status change.  Patient is admitted to the hospitalist service at this time, but I did evaluate her and she is hypotensive, alert and oriented, but having hematemesis and melena.  Hospitalist entered room just after I had left, currently seeing the patient and further stabilizing care.   Delman Kitten, MD 01/12/19 1141

## 2019-01-12 NOTE — Progress Notes (Signed)
I was informed by Patient's nurse Malachy Mood and charge nurse Theadora Rama that pt has had 3 large bloody BMs in the ER and had drop in her BP. This was about an hour after I saw the patient and she was completely stable with no active bleeding when I saw her. In addition, ER and ICU staff had reported no active bleeding to me when the consult was called.   I have asked Malachy Mood and Theadora Rama to notify the primary team and ER attending immediately to obtain appropriate orders for resuscitation of the patient.   The plan was to proceed with an EGD today after I evaluated her. We were awaiting COVID results and also anesthesia availability as they were in an OR case.  As I informed the above nurses, the COVID results are not the hold up as the pt condition has changed, but that resuscitation with IV fluids, PRBCs, evaluation for intubation if needed, correction of blood pressure is the appropriate first step while she is awaiting her EGD. With low SBP pt can decompensate during her EGD and needs to be evaluated by her team right away. We can proceed with EGD without COVID results in an emergent scenario and based on clinical status.   We are reassessing and will proceed with EGD today as appropriate with medical optimization as much as possible for her to be able to tolerate her procedure safely.

## 2019-01-12 NOTE — Progress Notes (Signed)
Spoke with night shift hospitalist, Dr. Damita Dunnings about patients episode of bright red blood from rectum. Dr. Damita Dunnings to initiate transfer to [facility] for further evaluation and management.

## 2019-01-12 NOTE — Transfer of Care (Signed)
Immediate Anesthesia Transfer of Care Note  Patient: Alicia Holder  Procedure(s) Performed: ESOPHAGOGASTRODUODENOSCOPY (EGD) (N/A )  Patient Location: ICU  Anesthesia Type:MAC  Level of Consciousness: awake, alert  and oriented  Airway & Oxygen Therapy: Patient Spontanous Breathing and non-rebreather face mask  Post-op Assessment: Report given to RN, Post -op Vital signs reviewed and stable and Patient moving all extremities X 4  Post vital signs: Reviewed and stable  Last Vitals:  Vitals Value Taken Time  BP 95/68 01/12/19 1353  Temp    Pulse 79 01/12/19 1353  Resp 31 01/12/19 1354  SpO2 95 % 01/12/19 1353  Vitals shown include unvalidated device data.  Last Pain:  Vitals:   01/12/19 1235  TempSrc: Temporal  PainSc: 0-No pain         Complications: No apparent anesthesia complications

## 2019-01-12 NOTE — Progress Notes (Signed)
Patient had 1 episode of bright red blood per rectum, moderately large episode. Informed GI, Dr. Bonna Gains, who stated from GI standpoint there is no further interventions that may be done. Requested that I contact ICU team or hospitalist team to inform them for further interventions possibly IR or vascular.  Will continue to monitor patient. At this time VSS, 1 unit PRBC ordered by ICU attending.

## 2019-01-12 NOTE — Consult Note (Signed)
Vonda Antigua, MD [address], [place], [place], [place], [address] [address], [place], [place], [place], [address] Phone: [phone]  Fax: [phone]  Consultation  Referring Provider:     Dr. Mal Misty Primary Care Physician:  Ellamae Sia, MD Reason for Consultation:    Anemia  Date of Admission:  01/11/2019 Date of Consultation:  01/12/2019         HPI:   AASHI GOHR is a 68 y.o. female who presented status post syncope and fall.  GI being consulted for acute anemia.  When I was informed about the consult by both ER staff, Dr. Corinna Capra, and ICU provider, Rufina Falco, they denied that patient had any episodes of active bleeding from any sources.  When I evaluated the patient today, she reported she had 1 episode of emesis yesterday at home that was coffee-ground appearing.  No blood in stool at home.  Reports taking aspirin 81 mg daily and no other NSAIDs.  Denied any abdominal pain.  Patient states she fell on her buttocks and did not hurt any other areas.  She has undergone CT chest, head, abdomen and pelvis without any obvious etiology or hematomas to explain her anemia.  Past Medical History:  Diagnosis Date  . AAA (abdominal aortic aneurysm) ([place])   . Colon cancer ([place])   . Hypertension     Past Surgical History:  Procedure Laterality Date  . ABDOMINAL HYSTERECTOMY    . APPENDECTOMY      Prior to Admission medications   Medication Sig Start Date End Date Taking? Authorizing Provider  ADVAIR DISKUS 250-50 MCG/DOSE AEPB Inhale 1 puff into the lungs 2 (two) times daily. 11/14/16  Yes [provider]  albuterol (VENTOLIN HFA) 108 (90 Base) MCG/ACT inhaler Inhale 1-2 puffs into the lungs every 4 (four) hours as needed for wheezing or shortness of breath.  07/09/18  Yes [provider]  alendronate (FOSAMAX) 70 MG tablet Take 70 mg by mouth once a week. 07/26/17  Yes [provider]  ALPRAZolam (XANAX) 0.25 MG tablet  Take 1 tablet (0.25 mg total) by mouth 2 (two) times daily as needed for anxiety. 01/05/19  Yes Fritzi Mandes, MD  docusate sodium (COLACE) 100 MG capsule Take 100 mg by mouth daily.   Yes [provider]  lisinopril (ZESTRIL) 20 MG tablet Take 20 mg by mouth daily.    Yes [provider]  montelukast (SINGULAIR) 10 MG tablet Take 10 mg by mouth at bedtime. 11/14/16  Yes [provider]  oxyCODONE (OXY IR/ROXICODONE) 5 MG immediate release tablet Take 5 mg by mouth 3 (three) times daily as needed for moderate pain or breakthrough pain.    Yes [provider]  sertraline (ZOLOFT) 50 MG tablet Take 50 mg by mouth daily. 11/14/16  Yes [provider]  simvastatin (ZOCOR) 20 MG tablet Take 20 mg by mouth daily. 11/14/16  Yes [provider]  tiotropium (SPIRIVA) 18 MCG inhalation capsule Place 1 capsule (18 mcg total) into inhaler and inhale daily. 01/05/19  Yes Fritzi Mandes, MD  ipratropium-albuterol (DUONEB) 0.5-2.5 (3) MG/3ML SOLN Take 3 mLs by nebulization every 4 (four) hours as needed. 01/05/19   Fritzi Mandes, MD  predniSONE (DELTASONE) 10 MG tablet Take 6 tablets (60 mg total) by mouth daily with breakfast. Take 60 mg daily--taper by 10 mg daily then stop Patient not taking: Reported on 01/12/2019 01/06/19   Fritzi Mandes, MD    Family History  Problem Relation  Age of Onset  . CAD Mother   . CAD Father      Social History   Tobacco Use  . Smoking status: Current Some Day Smoker    Packs/day: 1.00    Years: 41.00    Pack years: 41.00  . Smokeless tobacco: Never Used  Substance Use Topics  . Alcohol use: Yes    Comment: Once every 3 weeks  . Drug use: Not on file    Allergies as of 01/11/2019  . (No Known Allergies)    Review of Systems:    All systems reviewed and negative except where noted in HPI.   Physical Exam:  Vital signs in last 24 hours: Vitals:   01/12/19 1000 01/12/19 1111 01/12/19 1145 01/12/19 1206  BP: 109/68  97/62 (!) 90/53 121/67  Pulse: 93 (!) 118 (!) 107 93  Resp: (!) 22 (!) 24 (!) 33 (!) 23  Temp: 98.1 F (36.7 C)  98.8 F (37.1 C) 97.8 F (36.6 C)  TempSrc: Oral  Oral Oral  SpO2: 100% 100%  100%  Weight:      Height:         General:   Pleasant, cooperative in NAD Head:  Normocephalic and atraumatic. Eyes:   No icterus.   Conjunctiva pink. PERRLA. Ears:  Normal auditory acuity. Neck:  Supple; no masses or thyroidomegaly Lungs: Respirations even and unlabored. Lungs clear to auscultation bilaterally.   No wheezes, crackles, or rhonchi.  Abdomen:  Soft, nondistended, nontender. Normal bowel sounds. No appreciable masses or hepatomegaly.  No rebound or guarding.   No bruises or erythema on bilateral buttocks,  Nonbleeding external hemorrhoids on perianal exam Neurologic:  Alert and oriented x3;  grossly normal neurologically. Skin:  Intact without significant lesions or rashes. Cervical Nodes:  No significant cervical adenopathy. Psych:  Alert and cooperative. Normal affect.  LAB RESULTS: Recent Labs    01/11/19 1923 01/11/19 2317 01/12/19 0428 01/12/19 0817  WBC 21.6* 18.3* 13.8*  --   HGB 5.9* 5.3* 6.1* 7.0*  HCT 19.0* 16.5* 18.5* 21.5*  PLT 376 304 271  --    BMET Recent Labs    01/11/19 1923 01/11/19 2317 01/12/19 0428  NA 133* 130* 132*  K 5.1 3.7 3.5  CL 100 102 104  CO2 23 23 23   GLUCOSE 137* 111* 94  BUN 28* 25* 20  CREATININE 0.78 0.49 0.37*  CALCIUM 8.0* 7.0* 7.0*   LFT Recent Labs    01/11/19 1923 01/12/19 0428  PROT 4.9* 4.3*  ALBUMIN 2.4* 2.0*  AST 28 24  ALT 19 16  ALKPHOS 36* 28*  BILITOT 0.2*  0.5 0.4  BILIDIR <0.1  --   IBILI NOT CALCULATED  --    PT/INR Recent Labs    01/11/19 1923 01/12/19 0428  LABPROT 13.6 13.8  INR 1.1 1.1    STUDIES: CT Head Wo Contrast  Result Date: 01/11/2019 CLINICAL DATA:  Weakness, fall EXAM: CT HEAD WITHOUT CONTRAST TECHNIQUE: Contiguous axial images were obtained from the base of the skull  through the vertex without intravenous contrast. COMPARISON:  None. FINDINGS: Brain: No evidence of acute infarction, hemorrhage, hydrocephalus, extra-axial collection or mass lesion/mass effect. Minimal scattered low-density changes within the periventricular and subcortical white matter suggesting sequela of chronic microvascular ischemic disease. Vascular: No hyperdense vessel or unexpected calcification. Skull: Normal. Negative for fracture or focal lesion. Sinuses/Orbits: Mucosal thickening within the left sphenoid sinus. Orbital structures appear unremarkable. Other: None. IMPRESSION: 1. No acute intracranial findings. 2. Left  sphenoid sinus disease. Electronically Signed   By: Davina Poke M.D.   On: 01/11/2019 23:01   CT Chest Wo Contrast  Result Date: 01/11/2019 CLINICAL DATA:  Weakness and fall with history of abdominal aortic aneurysm EXAM: CT CHEST WITHOUT CONTRAST TECHNIQUE: Multidetector CT imaging of the chest was performed following the standard protocol without IV contrast. COMPARISON:  01/01/2019 FINDINGS: Cardiovascular: Somewhat limited due to lack of IV contrast. Atherosclerotic calcifications of the thoracic aorta are noted. 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 Coronary calcifications are seen. No cardiac enlargement is noted. Mediastinum/Nodes: Thoracic inlet demonstrates a large multinodular goiter with extension into the superior mediastinum from the left lobe stable from the prior exam. Chronic hiatal hernia is seen. No hilar or mediastinal adenopathy is noted. Lungs/Pleura: The lungs are well aerated bilaterally. No focal infiltrate or sizable effusion is seen. No parenchymal nodules are noted. Chronic scarring in the lingula is seen. Upper Abdomen: Recently reported on CT of the abdomen and pelvis. Musculoskeletal: Degenerative changes of the thoracic spine are seen. Old rib fractures are noted anteriorly stable from the prior exam. Stable thoracic  kyphosis is noted. No acute compression deformity is seen. 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 Recommend annual imaging followup by CTA or MRA. This recommendation follows 2010 ACCF/AHA/AATS/ACR/ASA/SCA/SCAI/SIR/STS/SVM Guidelines for the Diagnosis and Management of Patients with Thoracic Aortic Disease. Circulation. 2010; [id number]. Aortic aneurysm NOS (ICD10-I71.9) Large multinodular goiter stable from the prior exam. Hiatal hernia stable from the previous exam. Aortic Atherosclerosis (ICD10-I70.0). Electronically Signed   By: Inez Catalina M.D.   On: 01/11/2019 22:54   CT ABDOMEN PELVIS W CONTRAST  Result Date: 01/11/2019 CLINICAL DATA:  Acute abdominal pain. Intermittent right upper quadrant abdominal pain. EXAM: CT ABDOMEN AND PELVIS WITH CONTRAST TECHNIQUE: Multidetector CT imaging of the abdomen and pelvis was performed using the standard protocol following bolus administration of intravenous contrast. CONTRAST:  134mL OMNIPAQUE IOHEXOL 300 MG/ML  SOLN COMPARISON:  None. FINDINGS: Lower chest: The lung bases are clear. The heart size is normal. Hepatobiliary: The liver is normal. Cholelithiasis without acute inflammation.There is no biliary ductal dilation. Pancreas: Normal contours without ductal dilatation. No peripancreatic fluid collection. Spleen: No splenic laceration or hematoma. Adrenals/Urinary Tract: --Adrenal glands: No adrenal hemorrhage. --Right kidney/ureter: No hydronephrosis or perinephric hematoma. --Left kidney/ureter: No hydronephrosis or perinephric hematoma. --Urinary bladder: Unremarkable. Stomach/Bowel: --Stomach/Duodenum: There is a large hiatal hernia. --Small bowel: No dilatation or inflammation. --Colon: There is a ventral wall hernia containing a loop of the transverse colon without evidence for an obstruction. There are postsurgical changes of the right hemicolon without evidence for obstruction. --Appendix: Likely surgically absent.  Vascular/Lymphatic: Atherosclerotic calcification is present within the non-aneurysmal abdominal aorta, without hemodynamically significant stenosis. --No retroperitoneal lymphadenopathy. --there are some enlarged paraesophageal and gastrohepatic ligament lymph nodes --No pelvic or inguinal lymphadenopathy. Reproductive: There is a 1.6 cm left ovarian cystic structure. The patient is status post prior hysterectomy. Other: No ascites or free air. The abdominal wall is normal. Musculoskeletal. There is age-indeterminate height loss of L3 vertebral body, presumably chronic. IMPRESSION: 1. No acute abdominopelvic process. 2. Ventral wall hernia containing a loop of transverse colon without evidence for obstruction. 3. Large hiatal hernia. 4. Cholelithiasis without acute inflammation. 5. A 1.6 cm left ovarian cystic structure. Further evaluation with a nonemergent outpatient pelvic ultrasound is recommended in 6 months. 6. Age-indeterminate L3 vertebral body height loss, presumably chronic. 7. Mildly enlarged paraesophageal lymph nodes, presumably reactive.  Aortic Atherosclerosis (ICD10-I70.0). Electronically Signed   By: Constance Holster M.D.   On: 01/11/2019 22:54   DG Chest Port 1 View  Result Date: 01/11/2019 CLINICAL DATA:  Syncopal episode. Sepsis. EXAM: PORTABLE CHEST 1 VIEW COMPARISON:  January 01, 2019 FINDINGS: The heart size is stable from prior study. Aortic calcifications are noted. The thoracic aorta is tortuous. Hiatal hernia is again noted. There may be trace bilateral pleural effusions. There is an old fracture of the proximal left humerus. There are multiple old healed left-sided rib fractures. There is no pneumothorax. No large pleural effusion. IMPRESSION: 1. Possible trace bilateral pleural effusions. 2. Hiatal hernia. 3. No acute cardiopulmonary process. 4. Stable old left-sided rib fractures. Old left proximal humerus fracture. Electronically Signed   By: Constance Holster M.D.   On:  01/11/2019 19:54   US Abdomen Limited RUQ  Result Date: 01/11/2019 CLINICAL DATA:  Right upper quadrant pain and jaundice EXAM: ULTRASOUND ABDOMEN LIMITED RIGHT UPPER QUADRANT COMPARISON:  None. FINDINGS: Gallbladder: Gallbladder is well distended with evidence of gallstone within. No wall thickening or pericholecystic fluid is noted. Phrygian cap is seen with a large stone. Common bile duct: Diameter: 5 mm Liver: No focal lesion identified. Within normal limits in parenchymal echogenicity. Portal vein is patent on color Doppler imaging with normal direction of blood flow towards the liver. Other: None. IMPRESSION: Cholelithiasis without complicating factors. A Phrygian cap is seen with a stone noted within. Electronically Signed   By: Inez Catalina M.D.   On: 01/11/2019 20:39      Impression / Plan:   NIYOKA SAHLBERG is a 68 y.o. y/o female with presentation after syncope and fall with GI consulted for acute anemia  After my evaluation of the patient, patient reported coffee-ground emesis x1 at home yesterday.  The ER and ICU staff that called the consult did not notify me of any sources of active bleeding, including the coffee-ground emesis that patient reported to me this morning.  Since my evaluation of this patient this morning, she has had 3 bloody bowel movements in the ER and dropped her pressures.  She was evaluated by her primary team and resuscitated with IV fluids with appropriate response.  Her hemoglobin has improved with packed red blood cells since admission but continues to be low at 7 and patient has received additional transfusion since then.  Findings suspicious for possible upper GI bleed given that she had coffee-ground emesis at home and very low hemoglobin   However, given that she had bloody bowel movements in the ER, lower GI bleed is also a possibility  We will start with evaluation with upper endoscopy today and if negative, proceed with either RBC scan or  colonoscopy depending on any active ongoing bleeding  (Risks of PPI use were discussed with patient including bone loss, C. Diff diarrhea, pneumonia, infections, CKD, electrolyte abnormalities.  If clinically possible based on symptoms, goal would be to maintain patient on the lowest dose possible, or discontinue the medication with institution of acid reflux lifestyle modifications over time. Pt. Verbalizes understanding and chooses to continue the medication.)  I have discussed alternative options, risks & benefits,  which include, but are not limited to, bleeding, infection, perforation,respiratory complication & drug reaction.  The patient agrees with this plan & written consent will be obtained.     Thank you for involving me in the care of this patient.      LOS: 1 day   Virgel Manifold, MD  01/12/2019,  12:14 PM

## 2019-01-12 NOTE — ED Notes (Signed)
Pt transferred to endoscopy

## 2019-01-12 NOTE — Progress Notes (Signed)
PHARMACY NOTE:  ANTIMICROBIAL RENAL DOSAGE ADJUSTMENT  Current antimicrobial regimen includes a mismatch between antimicrobial dosage and estimated renal function.  As per policy approved by the Pharmacy & Therapeutics and Medical Executive Committees, the antimicrobial dosage will be adjusted accordingly.  Current antimicrobial dosage:  Cefepime 1 g q24h   Renal Function:  Estimated Creatinine Clearance: 64 mL/min (A) (by C-G formula based on SCr of 0.37 mg/dL (L)).     Antimicrobial dosage has been changed to:  2 g q12h    Thank you for allowing pharmacy to be a part of this patient's care.  Tawnya Crook, [facility] 01/12/2019 2:15 PM

## 2019-01-12 NOTE — Discharge Summary (Addendum)
Physician Discharge Summary  Alicia Holder [id number] DOB: 18-Jan-1951 DOA: 01/11/2019  PCP: Ellamae Sia, MD  Admit date: 01/11/2019 Discharge date: 01/13/2019  Admitted From: Home Discharge disposition: [place]   Recommendations for Outpatient Follow-Up:   1.PCP 2.GI   Discharge Diagnosis:   Principal Problem:   Severe anemia Active Problems:   Esophageal hemorrhage   Acute gastrointestinal hemorrhage   Hemorrhagic shock (HCC)   Vitamin B12 deficiency   Discharge Condition: Guarded  Diet recommendation: Low sodium, heart healthy.  Carbohydrate-modified.  Regular.  Wound care: None.  Code status: Full.   History of Present Illness:   Brief HPI: 68 y.o. female with pertinent past medical history of carcinoid tumor status post resection (2009), ascending thoracic aortic aneurysm, hypertension, hyperlipidemia, osteoporosis, COPD, tobacco abuse, secondary polycythemia also with hematology/oncology Dr. Nolon Stalls for monthly CBC and phlebotomy presenting to the ED with near syncopal episode associated with dizziness and right sided abdominal pain.   ED course: On arrival to the ED, she was hypothermic with blood pressure 65/50 mm Hg, pulse rate 101 beats/min and RR 33. There were no focal neurological deficits; she was alert and oriented x4,but noted to be very pale. Labs revealed glucose 137, BUN 28, albumin 2.4, lactic acid 4.1, WBC 21.6, RBC 2.07, hemoglobin 5.9, hematocrit 19.0, lipase 34. Given initial concerns for possible sepsis patient received IV fluid bolus was started on empiric IV antibiotics. Chest x-ray was obtained which showed possible trace bilateral pleural effusion, old left-sided rib fractures and left proximal humerus fracture otherwise no active cardiopulmonary process. Ultrasound abdomen right upper quadrant showed cholelithiasis without cholecystitis. Follow-up CT abdomen pelvis again confirms cholelithiasis without acute  inflammation otherwise no abnormality. CT chest showed a stable dilation of the ascending aorta without evidence of rupture.   Interim: During the course of her admission she continued to have large bloody stools and drop in her BP.  She was resuscitated with IV fluids and PRBC. Patient was evaluated by GI who recommended urgent EGD given hemodynamic instability.  Hospital Course by Problem:   68 y.o. female with pertinent past medical history of carcinoid tumor status post resection (2009), ascending thoracic aortic aneurysm, hypertension, hyperlipidemia, osteoporosis, COPD, tobacco abuse, secondary polycythemia presenting with near syncopal episode associated with dizziness. Found to be hypotensive with hemoglobin 5.9 dropped from 12.8 10 days ago. CBC repeated for confirmation which showed hemoglobin 5.3.initial concerns for acute hemolysis however ruled out normal LFTs, bilirubin and LDH.  1. Acute GI bleed - Upper/Lower, with evidence hemorrhagic shock Positive for hematemesis and  Hematochezia, Melena. S/p post EGD today showing red blood cells in the distal esophagus and blood clot in the gastric fundus - s/p 4 units of PRBCs - IVF resuscitation to maintain MAP>65  - H&H monitoring q6h.Transfuse PRN Hgb<7  - Pantoprazole 80mg  IV x1 then gtt 8mg /hr  - NPO - Hold NSAIDs, steroids, ASA - GI input appreciated - Given continued bleed with increase risk hemodynamic instability, patient being transferred to [facility] for vascular intervention.  Case previously discussed with Dr. Liana Crocker, IR who is available for intervention.  2. Right upper quadrant pain -improved.  No evidence of obstructive jaundice or fever.  - LFTs and bilirubin normal - Ultrasound right upper quadrant shows cholelithiasis without evidence of cholecystitis  - CT abdomen pelvis also shows cholelithiasis without cholecystitis  3. Leukocytosis : WBC 21.6 suspect this is likely reactive in the setting of severe  anemia.  Initial lactic acid of 4.1  now improved.  - UA negative  - CXR negative - Empiric abxwith ceftriaxone - Blood Cultures pending  -Trend WBC's and Procalcitonin  4. Secondary polycythemia  - Follows with hematology/oncology Dr. Susy Manor for monthly CBC and phlebotomy   5. HTN -patient now hypotensive in the setting of severe anemia -Hold BP meds for hypotension  6. HLD  + Goal LDL<100  - Simvastatin 20mg  PO qhs   7. COPD -no evidence of exacerbation - Continue home inhalers   DVT prophylaxis - Hold anti-coagulation due to active bleed. Will place SCDs   Medical Consultants:   1. GI  2. Hematology/oncology  3. Interventional Radiology   Discharge Exam:   Vitals:   01/12/19 2104 01/12/19 2230  BP:  107/63  Pulse:  90  Resp:  (!) 31  Temp: 98.6 F (37 C) 98.1 F (36.7 C)  SpO2:  99%   Vitals:   01/12/19 2045 01/12/19 2100 01/12/19 2104 01/12/19 2230  BP: 106/63   107/63  Pulse: 94 90  90  Resp: (!) 30 (!) 32  (!) 31  Temp: 98.7 F (37.1 C) 98.6 F (37 C) 98.6 F (37 C) 98.1 F (36.7 C)  TempSrc: Oral  Oral Oral  SpO2: 99% 99%  99%  Weight:      Height:       GENERAL:  @AGE @-year-old patient lying in the bed with no acute distress.  EYES: Pupils equal, round, reactive to light and accommodation. No scleral icterus. Extraocular muscles intact.  HEENT: Head atraumatic, normocephalic. Oropharynx and nasopharynx clear.  NECK:  Supple, no jugular venous distention. No thyroid enlargement, no tenderness.  LUNGS: Normal breath sounds bilaterally, no wheezing, rales,rhonchi or crepitation. No use of accessory muscles of respiration.  CARDIOVASCULAR: S1, S2 normal. No murmurs, rubs, or gallops.  ABDOMEN: Soft, non-tender, non-distended. Bowel sounds present. No organomegaly or mass.  EXTREMITIES: No pedal edema, cyanosis, or clubbing.  NEUROLOGIC: Cranial nerves II through XII are intact. Muscle strength 5/5 in all extremities. Sensation intact.  Gait not checked.  PSYCHIATRIC: The patient is alert and oriented x 3.  SKIN: No obvious rash, lesion, or ulcer.    The results of significant diagnostics from this hospitalization (including imaging, microbiology, ancillary and laboratory) are listed below for reference.     Procedures and Diagnostic Studies:   CT Head Wo Contrast  Result Date: 01/11/2019 CLINICAL DATA:  Weakness, fall EXAM: CT HEAD WITHOUT CONTRAST TECHNIQUE: Contiguous axial images were obtained from the base of the skull through the vertex without intravenous contrast. COMPARISON:  None. FINDINGS: Brain: No evidence of acute infarction, hemorrhage, hydrocephalus, extra-axial collection or mass lesion/mass effect. Minimal scattered low-density changes within the periventricular and subcortical white matter suggesting sequela of chronic microvascular ischemic disease. Vascular: No hyperdense vessel or unexpected calcification. Skull: Normal. Negative for fracture or focal lesion. Sinuses/Orbits: Mucosal thickening within the left sphenoid sinus. Orbital structures appear unremarkable. Other: None. IMPRESSION: 1. No acute intracranial findings. 2. Left sphenoid sinus disease. Electronically Signed   By: Davina Poke M.D.   On: 01/11/2019 23:01   CT Chest Wo Contrast  Result Date: 01/11/2019 CLINICAL DATA:  Weakness and fall with history of abdominal aortic aneurysm EXAM: CT CHEST WITHOUT CONTRAST TECHNIQUE: Multidetector CT imaging of the chest was performed following the standard protocol without IV contrast. COMPARISON:  01/01/2019 FINDINGS: Cardiovascular: Somewhat limited due to lack of IV contrast. Atherosclerotic calcifications of the thoracic aorta are noted. 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 Coronary  calcifications are seen. No cardiac enlargement is noted. Mediastinum/Nodes: Thoracic inlet demonstrates a large multinodular goiter with extension into the superior mediastinum from the left  lobe stable from the prior exam. Chronic hiatal hernia is seen. No hilar or mediastinal adenopathy is noted. Lungs/Pleura: The lungs are well aerated bilaterally. No focal infiltrate or sizable effusion is seen. No parenchymal nodules are noted. Chronic scarring in the lingula is seen. Upper Abdomen: Recently reported on CT of the abdomen and pelvis. Musculoskeletal: Degenerative changes of the thoracic spine are seen. Old rib fractures are noted anteriorly stable from the prior exam. Stable thoracic kyphosis is noted. No acute compression deformity is seen. 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 Recommend annual imaging followup by CTA or MRA. This recommendation follows 2010 ACCF/AHA/AATS/ACR/ASA/SCA/SCAI/SIR/STS/SVM Guidelines for the Diagnosis and Management of Patients with Thoracic Aortic Disease. Circulation. 2010; [id number]. Aortic aneurysm NOS (ICD10-I71.9) Large multinodular goiter stable from the prior exam. Hiatal hernia stable from the previous exam. Aortic Atherosclerosis (ICD10-I70.0). Electronically Signed   By: Inez Catalina M.D.   On: 01/11/2019 22:54   CT ABDOMEN PELVIS W CONTRAST  Result Date: 01/11/2019 CLINICAL DATA:  Acute abdominal pain. Intermittent right upper quadrant abdominal pain. EXAM: CT ABDOMEN AND PELVIS WITH CONTRAST TECHNIQUE: Multidetector CT imaging of the abdomen and pelvis was performed using the standard protocol following bolus administration of intravenous contrast. CONTRAST:  162mL OMNIPAQUE IOHEXOL 300 MG/ML  SOLN COMPARISON:  None. FINDINGS: Lower chest: The lung bases are clear. The heart size is normal. Hepatobiliary: The liver is normal. Cholelithiasis without acute inflammation.There is no biliary ductal dilation. Pancreas: Normal contours without ductal dilatation. No peripancreatic fluid collection. Spleen: No splenic laceration or hematoma. Adrenals/Urinary Tract: --Adrenal glands: No adrenal hemorrhage. --Right kidney/ureter: No  hydronephrosis or perinephric hematoma. --Left kidney/ureter: No hydronephrosis or perinephric hematoma. --Urinary bladder: Unremarkable. Stomach/Bowel: --Stomach/Duodenum: There is a large hiatal hernia. --Small bowel: No dilatation or inflammation. --Colon: There is a ventral wall hernia containing a loop of the transverse colon without evidence for an obstruction. There are postsurgical changes of the right hemicolon without evidence for obstruction. --Appendix: Likely surgically absent. Vascular/Lymphatic: Atherosclerotic calcification is present within the non-aneurysmal abdominal aorta, without hemodynamically significant stenosis. --No retroperitoneal lymphadenopathy. --there are some enlarged paraesophageal and gastrohepatic ligament lymph nodes --No pelvic or inguinal lymphadenopathy. Reproductive: There is a 1.6 cm left ovarian cystic structure. The patient is status post prior hysterectomy. Other: No ascites or free air. The abdominal wall is normal. Musculoskeletal. There is age-indeterminate height loss of L3 vertebral body, presumably chronic. IMPRESSION: 1. No acute abdominopelvic process. 2. Ventral wall hernia containing a loop of transverse colon without evidence for obstruction. 3. Large hiatal hernia. 4. Cholelithiasis without acute inflammation. 5. A 1.6 cm left ovarian cystic structure. Further evaluation with a nonemergent outpatient pelvic ultrasound is recommended in 6 months. 6. Age-indeterminate L3 vertebral body height loss, presumably chronic. 7. Mildly enlarged paraesophageal lymph nodes, presumably reactive. Aortic Atherosclerosis (ICD10-I70.0). Electronically Signed   By: Constance Holster M.D.   On: 01/11/2019 22:54   DG Chest Port 1 View  Result Date: 01/11/2019 CLINICAL DATA:  Syncopal episode. Sepsis. EXAM: PORTABLE CHEST 1 VIEW COMPARISON:  January 01, 2019 FINDINGS: The heart size is stable from prior study. Aortic calcifications are noted. The thoracic aorta is tortuous.  Hiatal hernia is again noted. There may be trace bilateral pleural effusions. There is an old fracture of the proximal left humerus. There are multiple old healed left-sided rib fractures.  There is no pneumothorax. No large pleural effusion. IMPRESSION: 1. Possible trace bilateral pleural effusions. 2. Hiatal hernia. 3. No acute cardiopulmonary process. 4. Stable old left-sided rib fractures. Old left proximal humerus fracture. Electronically Signed   By: Constance Holster M.D.   On: 01/11/2019 19:54   US Abdomen Limited RUQ  Result Date: 01/11/2019 CLINICAL DATA:  Right upper quadrant pain and jaundice EXAM: ULTRASOUND ABDOMEN LIMITED RIGHT UPPER QUADRANT COMPARISON:  None. FINDINGS: Gallbladder: Gallbladder is well distended with evidence of gallstone within. No wall thickening or pericholecystic fluid is noted. Phrygian cap is seen with a large stone. Common bile duct: Diameter: 5 mm Liver: No focal lesion identified. Within normal limits in parenchymal echogenicity. Portal vein is patent on color Doppler imaging with normal direction of blood flow towards the liver. Other: None. IMPRESSION: Cholelithiasis without complicating factors. A Phrygian cap is seen with a stone noted within. Electronically Signed   By: Inez Catalina M.D.   On: 01/11/2019 20:39     Labs:   Basic Metabolic Panel: Recent Labs  Lab 01/11/19 1923 01/11/19 2317 01/12/19 0428  NA 133* 130* 132*  K 5.1 3.7 3.5  CL 100 102 104  CO2 23 23 23   GLUCOSE 137* 111* 94  BUN 28* 25* 20  CREATININE 0.78 0.49 0.37*  CALCIUM 8.0* 7.0* 7.0*   GFR Estimated Creatinine Clearance: 64 mL/min (A) (by C-G formula based on SCr of 0.37 mg/dL (L)). Liver Function Tests: Recent Labs  Lab 01/11/19 1923 01/11/19 2317 01/12/19 0428  AST 28 19 24   ALT 19 17 16   ALKPHOS 36* 33* 28*  BILITOT 0.2*  0.5 0.2* 0.4  PROT 4.9* 4.8* 4.3*  ALBUMIN 2.4* 2.3* 2.0*   Recent Labs  Lab 01/11/19 1923  LIPASE 34   No results for input(s):  AMMONIA in the last 168 hours. Coagulation profile Recent Labs  Lab 01/11/19 1923 01/12/19 0428  INR 1.1 1.1    CBC: Recent Labs  Lab 01/11/19 1923 01/11/19 2317 01/12/19 0428 01/12/19 0817 01/12/19 1847 01/12/19 2347  WBC 21.6* 18.3* 13.8*  --   --  13.2*  NEUTROABS 16.0* 14.5*  --   --   --  9.9*  HGB 5.9* 5.3* 6.1* 7.0* 9.1* 8.8*  HCT 19.0* 16.5* 18.5* 21.5* 27.7* 25.7*  MCV 91.8 88.2 88.1  --   --  88.0  PLT 376 304 271  --   --  201   Cardiac Enzymes: No results for input(s): CKTOTAL, CKMB, CKMBINDEX, TROPONINI in the last 168 hours. BNP: Invalid input(s): POCBNP CBG: No results for input(s): GLUCAP in the last 168 hours. D-Dimer No results for input(s): DDIMER in the last 72 hours. Hgb A1c No results for input(s): HGBA1C in the last 72 hours. Lipid Profile No results for input(s): CHOL, HDL, LDLCALC, TRIG, CHOLHDL, LDLDIRECT in the last 72 hours. Thyroid function studies No results for input(s): TSH, T4TOTAL, T3FREE, THYROIDAB in the last 72 hours.  Invalid input(s): FREET3 Anemia work up Recent Labs    01/11/19 2317  VITAMINB12 173*  FOLATE 10.3  FERRITIN 26  TIBC 279  IRON 32  RETICCTPCT 4.8*   Microbiology Recent Results (from the past 240 hour(s))  Culture, blood (Routine x 2)     Status: None (Preliminary result)   Collection Time: 01/11/19  7:23 PM   Specimen: BLOOD  Result Value Ref Range Status   Specimen Description BLOOD LEFT FOREARM  Final   Special Requests   Final    BOTTLES DRAWN  AEROBIC AND ANAEROBIC Blood Culture results may not be optimal due to an excessive volume of blood received in culture bottles   Culture   Final    NO GROWTH < 12 HOURS Performed at [facility], [address] [place], [place] [address]    Report Status PENDING  Incomplete  Culture, blood (Routine x 2)     Status: None (Preliminary result)   Collection Time: 01/11/19  7:41 PM   Specimen: BLOOD  Result Value Ref Range Status   Specimen  Description BLOOD LEFT FOREARM  Final   Special Requests   Final    BOTTLES DRAWN AEROBIC AND ANAEROBIC Blood Culture adequate volume   Culture   Final    NO GROWTH < 12 HOURS Performed at [facility], [address] [place], [place] [address]    Report Status PENDING  Incomplete  SARS CORONAVIRUS 2 (TAT 6-24 HRS) Nasopharyngeal Nasopharyngeal Swab     Status: None   Collection Time: 01/11/19  9:44 PM   Specimen: Nasopharyngeal Swab  Result Value Ref Range Status   SARS Coronavirus 2 NEGATIVE NEGATIVE Final    Comment: (NOTE) SARS-CoV-2 target nucleic acids are NOT DETECTED. The SARS-CoV-2 RNA is generally detectable in upper and lower respiratory specimens during the acute phase of infection. Negative results do not preclude SARS-CoV-2 infection, do not rule out co-infections with other pathogens, and should not be used as the sole basis for treatment or other patient management decisions. Negative results must be combined with clinical observations, patient history, and epidemiological information. The expected result is Negative. Fact Sheet for Patients: SugarRoll.be Fact Sheet for Healthcare Providers: https://www.woods-mathews.com/ This test is not yet approved or cleared by the [place] FDA and  has been authorized for detection and/or diagnosis of SARS-CoV-2 by FDA under an Emergency Use Authorization (EUA). This EUA will remain  in effect (meaning this test can be used) for the duration of the COVID-19 declaration under Section 56 4(b)(1) of the Act, 21 U.S.C. section 360bbb-3(b)(1), unless the authorization is terminated or revoked sooner. Performed at [facility], [place] [address], [place],  [address]      Discharge Instructions:    Allergies as of 01/13/2019   No Known Allergies     Medication List    STOP taking these medications   predniSONE 10 MG tablet Commonly known as: DELTASONE       TAKE these medications   Advair Diskus 250-50 MCG/DOSE Aepb Generic drug: Fluticasone-Salmeterol Inhale 1 puff into the lungs 2 (two) times daily.   albuterol 108 (90 Base) MCG/ACT inhaler Commonly known as: VENTOLIN HFA Inhale 1-2 puffs into the lungs every 4 (four) hours as needed for wheezing or shortness of breath.   alendronate 70 MG tablet Commonly known as: FOSAMAX Take 70 mg by mouth once a week.   ALPRAZolam 0.25 MG tablet Commonly known as: XANAX Take 1 tablet (0.25 mg total) by mouth 2 (two) times daily as needed for anxiety.   docusate sodium 100 MG capsule Commonly known as: COLACE Take 100 mg by mouth daily.   ipratropium-albuterol 0.5-2.5 (3) MG/3ML Soln Commonly known as: DUONEB Take 3 mLs by nebulization every 4 (four) hours as needed.   lisinopril 20 MG tablet Commonly known as: ZESTRIL Take 20 mg by mouth daily.   montelukast 10 MG tablet Commonly known as: SINGULAIR Take 10 mg by mouth at bedtime.   oxyCODONE 5 MG immediate release tablet Commonly known as: Oxy IR/ROXICODONE Take 5  mg by mouth 3 (three) times daily as needed for moderate pain or breakthrough pain.   sertraline 50 MG tablet Commonly known as: ZOLOFT Take 50 mg by mouth daily.   simvastatin 20 MG tablet Commonly known as: ZOCOR Take 20 mg by mouth daily.   tiotropium 18 MCG inhalation capsule Commonly known as: SPIRIVA Place 1 capsule (18 mcg total) into inhaler and inhale daily.         Time coordinating discharge: 50 minutes  Signed:  Rufina Falco, DNP, CCRN, FNP-C [facility] Nurse Practitioner Between 7pm to [place] - Pager [phone] Actively using [employer] secure chat messaging  After 7am go to [email or web address] - [email or web address]  Office  [phone]        \

## 2019-01-12 NOTE — H&P (Signed)
History and Physical    Alicia Holder [id number] DOB: 02-09-1950 DOA: 01/11/2019  PCP: Ellamae Sia, MD    Chief Complaint: Itchiness and weakness  HPI:68 year old female with a history of carcinoid tumor status post resection 2009, thoracic aortic aneurysm, hypertension, COPD, tobacco abuse as well as secondary polycythemia followed with hematologist Dr. Mike Gip for monthly CBC and phlebotomy who presented to the emergency room with a near syncopal episode associated with dizziness and right-sided abdominal pain. Patient reports that she was walking in the hallway when she suddenly felt lightheaded and thought she was going to pass out so she was able to sit down to avoid falling.  Brought in by EMS.  On arrival to the emergency room she was hypotensive with a blood pressure of 65/50, heart rate 101 and respirations 33.  She was alert and oriented.  Blood work significant for hemoglobin of 5.9, white cell count of 21,000.  In the emergency room added on IV fluids.  Chest x-ray showed trace bilateral pleural effusion and an old left-sided rib fracture.  Due to complaint of right upper quadrant pain she had an ultrasound that showed cholelithiasis without cholecystitis.  CT abdomen and pelvis showed no acute inflammation.  CT chest showed stable dilation of the ascending aorta without evidence of rupture.   Review of Systems: As per HPI otherwise 10 point review of systems negative.    Past Medical History:  Diagnosis Date  . AAA (abdominal aortic aneurysm) ([place])   . Colon cancer ([place])   . Hypertension     Past Surgical History:  Procedure Laterality Date  . ABDOMINAL HYSTERECTOMY    . APPENDECTOMY       reports that she has been smoking. She has a 41.00 pack-year smoking history. She has never used smokeless tobacco. She reports current alcohol use. No history on file for drug.  No Known Allergies  Family History  Problem Relation Age of Onset  . CAD Mother   . CAD  Father      Prior to Admission medications   Medication Sig Start Date End Date Taking? Authorizing Provider  ADVAIR DISKUS 250-50 MCG/DOSE AEPB Inhale 1 puff into the lungs 2 (two) times daily. 11/14/16   [provider]  albuterol (VENTOLIN HFA) 108 (90 Base) MCG/ACT inhaler Inhale 1-2 puffs into the lungs every 4 (four) hours as needed for wheezing or shortness of breath.  07/09/18   [provider]  alendronate (FOSAMAX) 70 MG tablet Take 70 mg by mouth once a week. 07/26/17   [provider]  ALPRAZolam Duanne Moron) 0.25 MG tablet Take 1 tablet (0.25 mg total) by mouth 2 (two) times daily as needed for anxiety. 01/05/19   Fritzi Mandes, MD  docusate sodium (COLACE) 100 MG capsule Take 100 mg by mouth daily.    [provider]  ipratropium-albuterol (DUONEB) 0.5-2.5 (3) MG/3ML SOLN Take 3 mLs by nebulization every 4 (four) hours as needed. 01/05/19   Fritzi Mandes, MD  lisinopril (ZESTRIL) 20 MG tablet Take 20 mg by mouth daily.     [provider]  montelukast (SINGULAIR) 10 MG tablet Take 10 mg by mouth at bedtime. 11/14/16   [provider]  oxyCODONE (OXY IR/ROXICODONE) 5 MG immediate release tablet Take 5 mg by mouth 3 (three) times daily as needed for moderate pain or breakthrough pain.     [provider]  predniSONE (DELTASONE) 10 MG tablet Take 6 tablets (60 mg total) by mouth daily with breakfast. Take 60  mg daily--taper by 10 mg daily then stop 01/06/19   Fritzi Mandes, MD  sertraline (ZOLOFT) 50 MG tablet Take 50 mg by mouth daily. 11/14/16   [provider]  simvastatin (ZOCOR) 20 MG tablet Take 20 mg by mouth daily. 11/14/16   [provider]  tiotropium (SPIRIVA) 18 MCG inhalation capsule Place 1 capsule (18 mcg total) into inhaler and inhale daily. 01/05/19   Fritzi Mandes, MD    Physical Exam: Vitals:   01/11/19 2310 01/11/19 2311 01/12/19 0203 01/12/19 0218  BP: 103/68  101/63 (!) 96/59  Pulse:  (!) 106 92 94    Resp: 20 20 (!) 24 16  Temp:   98.3 F (36.8 C) 98.3 F (36.8 C)  TempSrc:   Oral Oral  SpO2:  100% 100% 100%  Weight:      Height:        Constitutional: NAD, calm, comfortable Vitals:   01/11/19 2310 01/11/19 2311 01/12/19 0203 01/12/19 0218  BP: 103/68  101/63 (!) 96/59  Pulse:  (!) 106 92 94  Resp: 20 20 (!) 24 16  Temp:   98.3 F (36.8 C) 98.3 F (36.8 C)  TempSrc:   Oral Oral  SpO2:  100% 100% 100%  Weight:      Height:       Eyes: pale conjunctival ENMT: Mucous membranes are moist. Posterior pharynx clear of any exudate or lesions.Normal dentition.  Neck: normal, supple, no masses, no thyromegaly Respiratory: clear to auscultation bilaterally,tachypneic  Cardiovascular: Regular rate and rhythm, no murmurs / rubs / gallops. No extremity edema. 2+ pedal pulses. No carotid bruits.  Abdomen: mildly tender in RUQ Mild tenderness Musculoskeletal: no clubbing / cyanosis. No joint deformity upper and lower extremities. Good ROM, no contractures. Normal muscle tone.  Skin: no rashes, lesions, ulcers. No induration Neurologic: CN 2-12 grossly intact. Sensation intact, DTR normal. Strength 5/5 in all 4.  Psychiatric: Normal judgment and insight. Alert and oriented x 3. Normal mood.     Labs on Admission: I have personally reviewed following labs and imaging studies  CBC: Recent Labs  Lab 01/11/19 1923 01/11/19 2317  WBC 21.6* 18.3*  NEUTROABS 16.0* 14.5*  HGB 5.9* 5.3*  HCT 19.0* 16.5*  MCV 91.8 88.2  PLT 376 [id number]   Basic Metabolic Panel: Recent Labs  Lab 01/11/19 1923 01/11/19 2317  NA 133* 130*  K 5.1 3.7  CL 100 102  CO2 23 23  GLUCOSE 137* 111*  BUN 28* 25*  CREATININE 0.78 0.49  CALCIUM 8.0* 7.0*   GFR: Estimated Creatinine Clearance: 62.9 mL/min (by C-G formula based on SCr of 0.49 mg/dL). Liver Function Tests: Recent Labs  Lab 01/11/19 1923 01/11/19 2317  AST 28 19  ALT 19 17  ALKPHOS 36* 33*  BILITOT 0.2*  0.5 0.2*  PROT 4.9* 4.8*   ALBUMIN 2.4* 2.3*   Recent Labs  Lab 01/11/19 1923  LIPASE 34   No results for input(s): AMMONIA in the last 168 hours. Coagulation Profile: Recent Labs  Lab 01/11/19 1923  INR 1.1   Cardiac Enzymes: No results for input(s): CKTOTAL, CKMB, CKMBINDEX, TROPONINI in the last 168 hours. BNP (last 3 results) No results for input(s): PROBNP in the last 8760 hours. HbA1C: No results for input(s): HGBA1C in the last 72 hours. CBG: No results for input(s): GLUCAP in the last 168 hours. Lipid Profile: No results for input(s): CHOL, HDL, LDLCALC, TRIG, CHOLHDL, LDLDIRECT in the last 72 hours. Thyroid Function Tests: No results for input(s):  TSH, T4TOTAL, FREET4, T3FREE, THYROIDAB in the last 72 hours. Anemia Panel: Recent Labs    01/11/19 2317  FOLATE 10.3  FERRITIN 26  TIBC 279  IRON 32  RETICCTPCT 4.8*   Urine analysis:    Component Value Date/Time   COLORURINE YELLOW (A) 01/11/2019 2144   APPEARANCEUR CLEAR (A) 01/11/2019 2144   LABSPEC >1.046 (H) 01/11/2019 2144   PHURINE 6.0 01/11/2019 2144   GLUCOSEU NEGATIVE 01/11/2019 2144   HGBUR SMALL (A) 01/11/2019 2144   [place] NEGATIVE 01/11/2019 2144   KETONESUR 5 (A) 01/11/2019 2144   PROTEINUR NEGATIVE 01/11/2019 2144   NITRITE NEGATIVE 01/11/2019 2144   LEUKOCYTESUR NEGATIVE 01/11/2019 2144    Radiological Exams on Admission: CT Head Wo Contrast  Result Date: 01/11/2019 CLINICAL DATA:  Weakness, fall EXAM: CT HEAD WITHOUT CONTRAST TECHNIQUE: Contiguous axial images were obtained from the base of the skull through the vertex without intravenous contrast. COMPARISON:  None. FINDINGS: Brain: No evidence of acute infarction, hemorrhage, hydrocephalus, extra-axial collection or mass lesion/mass effect. Minimal scattered low-density changes within the periventricular and subcortical white matter suggesting sequela of chronic microvascular ischemic disease. Vascular: No hyperdense vessel or unexpected calcification.  Skull: Normal. Negative for fracture or focal lesion. Sinuses/Orbits: Mucosal thickening within the left sphenoid sinus. Orbital structures appear unremarkable. Other: None. IMPRESSION: 1. No acute intracranial findings. 2. Left sphenoid sinus disease. Electronically Signed   By: Davina Poke M.D.   On: 01/11/2019 23:01   CT Chest Wo Contrast  Result Date: 01/11/2019 CLINICAL DATA:  Weakness and fall with history of abdominal aortic aneurysm EXAM: CT CHEST WITHOUT CONTRAST TECHNIQUE: Multidetector CT imaging of the chest was performed following the standard protocol without IV contrast. COMPARISON:  01/01/2019 FINDINGS: Cardiovascular: Somewhat limited due to lack of IV contrast. Atherosclerotic calcifications of the thoracic aorta are noted. 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 Coronary calcifications are seen. No cardiac enlargement is noted. Mediastinum/Nodes: Thoracic inlet demonstrates a large multinodular goiter with extension into the superior mediastinum from the left lobe stable from the prior exam. Chronic hiatal hernia is seen. No hilar or mediastinal adenopathy is noted. Lungs/Pleura: The lungs are well aerated bilaterally. No focal infiltrate or sizable effusion is seen. No parenchymal nodules are noted. Chronic scarring in the lingula is seen. Upper Abdomen: Recently reported on CT of the abdomen and pelvis. Musculoskeletal: Degenerative changes of the thoracic spine are seen. Old rib fractures are noted anteriorly stable from the prior exam. Stable thoracic kyphosis is noted. No acute compression deformity is seen. 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 Recommend annual imaging followup by CTA or MRA. This recommendation follows 2010 ACCF/AHA/AATS/ACR/ASA/SCA/SCAI/SIR/STS/SVM Guidelines for the Diagnosis and Management of Patients with Thoracic Aortic Disease. Circulation. 2010; [id number]. 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 Hiatal hernia stable from the previous exam. Aortic Atherosclerosis (ICD10-I70.0). Electronically Signed   By: Inez Catalina M.D.   On: 01/11/2019 22:54   CT ABDOMEN PELVIS W CONTRAST  Result Date: 01/11/2019 CLINICAL DATA:  Acute abdominal pain. Intermittent right upper quadrant abdominal pain. EXAM: CT ABDOMEN AND PELVIS WITH CONTRAST TECHNIQUE: Multidetector CT imaging of the abdomen and pelvis was performed using the standard protocol following bolus administration of intravenous contrast. CONTRAST:  161mL OMNIPAQUE IOHEXOL 300 MG/ML  SOLN COMPARISON:  None. FINDINGS: Lower chest: The lung bases are clear. The heart size is normal. Hepatobiliary: The liver is normal. Cholelithiasis without acute inflammation.There is no  biliary ductal dilation. Pancreas: Normal contours without ductal dilatation. No peripancreatic fluid collection. Spleen: No splenic laceration or hematoma. Adrenals/Urinary Tract: --Adrenal glands: No adrenal hemorrhage. --Right kidney/ureter: No hydronephrosis or perinephric hematoma. --Left kidney/ureter: No hydronephrosis or perinephric hematoma. --Urinary bladder: Unremarkable. Stomach/Bowel: --Stomach/Duodenum: There is a large hiatal hernia. --Small bowel: No dilatation or inflammation. --Colon: There is a ventral wall hernia containing a loop of the transverse colon without evidence for an obstruction. There are postsurgical changes of the right hemicolon without evidence for obstruction. --Appendix: Likely surgically absent. Vascular/Lymphatic: Atherosclerotic calcification is present within the non-aneurysmal abdominal aorta, without hemodynamically significant stenosis. --No retroperitoneal lymphadenopathy. --there are some enlarged paraesophageal and gastrohepatic ligament lymph nodes --No pelvic or inguinal lymphadenopathy. Reproductive: There is a 1.6 cm left ovarian cystic structure. The patient is status post prior hysterectomy.  Other: No ascites or free air. The abdominal wall is normal. Musculoskeletal. There is age-indeterminate height loss of L3 vertebral body, presumably chronic. IMPRESSION: 1. No acute abdominopelvic process. 2. Ventral wall hernia containing a loop of transverse colon without evidence for obstruction. 3. Large hiatal hernia. 4. Cholelithiasis without acute inflammation. 5. A 1.6 cm left ovarian cystic structure. Further evaluation with a nonemergent outpatient pelvic ultrasound is recommended in 6 months. 6. Age-indeterminate L3 vertebral body height loss, presumably chronic. 7. Mildly enlarged paraesophageal lymph nodes, presumably reactive. Aortic Atherosclerosis (ICD10-I70.0). Electronically Signed   By: Constance Holster M.D.   On: 01/11/2019 22:54   DG Chest Port 1 View  Result Date: 01/11/2019 CLINICAL DATA:  Syncopal episode. Sepsis. EXAM: PORTABLE CHEST 1 VIEW COMPARISON:  January 01, 2019 FINDINGS: The heart size is stable from prior study. Aortic calcifications are noted. The thoracic aorta is tortuous. Hiatal hernia is again noted. There may be trace bilateral pleural effusions. There is an old fracture of the proximal left humerus. There are multiple old healed left-sided rib fractures. There is no pneumothorax. No large pleural effusion. IMPRESSION: 1. Possible trace bilateral pleural effusions. 2. Hiatal hernia. 3. No acute cardiopulmonary process. 4. Stable old left-sided rib fractures. Old left proximal humerus fracture. Electronically Signed   By: Constance Holster M.D.   On: 01/11/2019 19:54   US Abdomen Limited RUQ  Result Date: 01/11/2019 CLINICAL DATA:  Right upper quadrant pain and jaundice EXAM: ULTRASOUND ABDOMEN LIMITED RIGHT UPPER QUADRANT COMPARISON:  None. FINDINGS: Gallbladder: Gallbladder is well distended with evidence of gallstone within. No wall thickening or pericholecystic fluid is noted. Phrygian cap is seen with a large stone. Common bile duct: Diameter: 5 mm Liver: No  focal lesion identified. Within normal limits in parenchymal echogenicity. Portal vein is patent on color Doppler imaging with normal direction of blood flow towards the liver. Other: None. IMPRESSION: Cholelithiasis without complicating factors. A Phrygian cap is seen with a stone noted within. Electronically Signed   By: Inez Catalina M.D.   On: 01/11/2019 20:39      Assessment/Plan Severe symptomatic anemia --Hemoglobin 5.9, down from 12.810 days prior --Transfuse 2 units PRBC --GI consult for EGD/colonoscopy --Hold NSAIDs, steroids and aspirin --Case discussed with hematologist on-call Dr. Tasia Catchings was advised on further lab testing  Right upper quadrant pain --Patient had dilated gallbladder with a gallstone but no evidence of obstruction or cholecystitis  Leukocytosis --Possibly reactive but no definite source of infection --Urine and chest x-ray unremarkable. --Blood cultures --Got a dose of maxepime and flagyl in the ER --continue empiric maxepime   Polycythemia --Follow-up with hematology  Hypotension --Hold antihypertensives --IV hydration   Onalee Hua  Lucile Shutters MD [facility]   If 7PM-7AM, please contact night-coverage [email or web address] Password [email or web address]  01/12/2019, 4:06 AM

## 2019-01-12 NOTE — Consult Note (Signed)
Hematology/Oncology Consult note [facility] Telephone:([phone] [phone]  Patient Care Team: Ellamae Sia, MD as PCP - General (Internal Medicine) Lucilla Lame, MD as Consulting Physician (Gastroenterology)   Name of the patient: Alicia Holder  LC:[id number]  [birth year]   Date of visit: 01/12/19 REASON FOR COSULTATION:  Severe anemia History of presenting illness-  68 y.o. female with PMH listed at below who presents to ER for evaluation of near syncope episode associate with dizziness, right-sided abdominal pain. Patient was brought in by EMS.  On arrival, patient was hypotensive with blood pressure of 65 over 50s.  Heart rate 101, respiration 33. Blood work was significant for hemoglobin 5.9, white count 21,000.  Patient was started on IV fluid and vasopressor. I was called by ER nurse practitioner Benjamine Mola regarding patient's profoundly low hemoglobin.  Patient also noticed her skin is "jaundiced".  patient had a history of secondary Erythrocytosis and follows up with Dr. Mike Gip  For phlebotomy.  She was last seen by her on 11/22/2018.  At that time her hemoglobin was 14.7.  Bilirubin was normal. 01/01/2019, hemoglobin 14.4.  01/02/2019 hemoglobin 12.8. When I was contacted by emergency room provider, there was no report of any history of GI bleeding.  I suggested GI work-up as well as work-up of hemolysis.  CBC and CMP were repeated to ensure correctness.  Repeat CBC showed hemoglobin 5.3.  Reticulocyte showed increased reticulocyte count percentage 4.8, LDH is not increased.  Haptoglobin is pending.  Vitamin B12 was low at 173.  With these labs, I communicated with ER provider Rufina Falco that patient less likely has acute hemolysis.  I highly suspect that patient has acute blood loss from GI bleeding.  I suggest to consult gastroenterology immediately.  Recommend transfuse PRBCs to keep hemoglobin above 7.  Patient was Admitted overnight, to  ICU, IV fluid resuscitation, Blood transfusionand H&H monitoring.  GI was consulted. CT abdomen pelvis showed no acute abdominal or pelvic process.   CT chest showed  stable dilatation of the ascending aorta 4.2 cm. Chronic findings refer to CT report. Patient later had hematemesis and melena in the ER. Patient status postEGD today which showed red blood in the distal esophagus and blood clot in the gastric fundus.  Hemostatic spray applied.  Patient was seen today by me at the bedside.  She reports feeling quite tired.  She has a history of alcohol use.  Recently she drinks every few weeks to months.  Limited history was obtained due to patient's current condition, feeling very tired to provide detailed history is.  Review of Systems  Unable to perform ROS: Unstable vital signs  Constitutional: Positive for fatigue.  Respiratory: Negative for shortness of breath.   Gastrointestinal: Positive for abdominal pain and blood in stool.  Genitourinary: Negative for dysuria.   Psychiatric/Behavioral: Negative for confusion.    No Known Allergies  Patient Active Problem List   Diagnosis Date Noted  . Severe anemia 01/12/2019  . Hemorrhagic shock ([place]) 01/12/2019  . Esophageal hemorrhage   . Acute gastrointestinal hemorrhage   . Essential hypertension   . Hyperlipidemia   . Tobacco abuse counseling   . COPD with acute exacerbation ([place]) 01/01/2019  . Acute respiratory failure with hypoxia ([place]) 01/01/2019  . COPD exacerbation ([place]) 01/01/2019  . Goals of care, counseling/discussion 05/10/2017  . Aortic aneurysm, thoracic ([place]) 03/30/2017  . Thyroid nodule 03/30/2017  . 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  AAA (abdominal aortic aneurysm) ([place])   . Colon cancer ([place])   . Hypertension      Past Surgical History:  Procedure Laterality Date  . ABDOMINAL HYSTERECTOMY    . APPENDECTOMY      Social History   Socioeconomic History  .  Marital status: Widowed    Spouse name: Not on file  . Number of children: Not on file  . Years of education: Not on file  . Highest education level: Not on file  Occupational History  . Not on file  Tobacco Use  . Smoking status: Current Some Day Smoker    Packs/day: 1.00    Years: 41.00    Pack years: 41.00  . Smokeless tobacco: Never Used  Substance and Sexual Activity  . Alcohol use: Yes    Comment: Once every 3 weeks  . Drug use: Never  . Sexual activity: Not on file  Other Topics Concern  . Not on file  Social History Narrative  . Not on file   Social Determinants of Health   Financial Resource Strain:   . Difficulty of Paying Living Expenses: Not on file  Food Insecurity:   . Worried About Charity fundraiser in the Last Year: Not on file  . Ran Out of Food in the Last Year: Not on file  Transportation Needs:   . Lack of Transportation (Medical): Not on file  . Lack of Transportation (Non-Medical): Not on file  Physical Activity:   . Days of Exercise per Week: Not on file  . Minutes of Exercise per Session: Not on file  Stress:   . Feeling of Stress : Not on file  Social Connections:   . Frequency of Communication with Friends and Family: Not on file  . Frequency of Social Gatherings with Friends and Family: Not on file  . Attends Religious Services: Not on file  . Active Member of Clubs or Organizations: Not on file  . Attends Archivist Meetings: Not on file  . Marital Status: Not on file  Intimate Partner Violence:   . Fear of Current or Ex-Partner: Not on file  . Emotionally Abused: Not on file  . Physically Abused: Not on file  . Sexually Abused: Not on file     Family History  Problem Relation Age of Onset  . CAD Mother   . CAD Father      Current Facility-Administered Medications:  .  0.9 %  sodium chloride infusion (Manually program via Guardrails IV Fluids), , Intravenous, Once, Jennye Boroughs, MD .  0.9 %  sodium chloride  infusion (Manually program via Guardrails IV Fluids), , Intravenous, Once, Aleskerov, Fuad, MD .  0.9 %  sodium chloride infusion, , Intravenous, Continuous, Ouma, Bing Neighbors, NP, Last Rate: 75 mL/hr at 01/12/19 1802, New Bag at 01/12/19 1802 .  cefTRIAXone (ROCEPHIN) 1 g in sodium chloride 0.9 % 100 mL IVPB, 1 g, Intravenous, Q24H, Aleskerov, Fuad, MD, Last Rate: 200 mL/hr at 01/12/19 1610, 1 g at 01/12/19 1610 .  Chlorhexidine Gluconate Cloth 2 % PADS 6 each, 6 each, Topical, Daily, Ottie Glazier, MD, 6 each at 01/12/19 1549 .  cyanocobalamin ((VITAMIN B-12)) injection 1,000 mcg, 1,000 mcg, Intramuscular, Daily, Jennye Boroughs, MD .  iohexol (OMNIPAQUE) 9 MG/ML oral solution 500 mL, 500 mL, Oral, Once PRN, Nena Polio, MD, 500 mL at 01/11/19 2119 .  MEDLINE mouth rinse, 15 mL, Mouth Rinse, BID, Aleskerov, Fuad, MD .  norepinephrine (LEVOPHED) 4 mg in sodium  chloride 0.9 % 250 mL (0.016 mg/mL) infusion, 0-40 mcg/min, Intravenous, Titrated, Jennye Boroughs, MD, Stopped at 01/12/19 1638 .  pantoprazole (PROTONIX) 80 mg in sodium chloride 0.9 % 250 mL (0.32 mg/mL) infusion, 8 mg/hr, Intravenous, Continuous, Ouma, Bing Neighbors, NP, Last Rate: 25 mL/hr at 01/12/19 1026, 8 mg/hr at 01/12/19 1026 .  [START ON 01/15/2019] pantoprazole (PROTONIX) injection 40 mg, 40 mg, Intravenous, Q12H, Lang Snow, NP   Physical exam:  Vitals:   01/12/19 1700 01/12/19 1712 01/12/19 1715 01/12/19 1800  BP: 103/60  101/64 100/67  Pulse: 84 85 87 84  Resp: 18 17 (!) 21 (!) 22  Temp:   97.6 F (36.4 C)   TempSrc:   Oral   SpO2: 100% 100% 100% 100%  Weight:      Height:       Physical Exam  Constitutional: She is oriented to person, place, and time. No distress.  HENT:  Head: Normocephalic and atraumatic.  Mouth/Throat: No oropharyngeal exudate.  Eyes: Pupils are equal, round, and reactive to light. EOM are normal. No scleral icterus.  Cardiovascular: Normal rate and regular rhythm.    Pulmonary/Chest: Effort normal. No respiratory distress. She exhibits tenderness.  Abdominal: Soft. Bowel sounds are normal. She exhibits no distension.  Musculoskeletal:        General: No edema. Normal range of motion.     Cervical back: Normal range of motion and neck supple.  Neurological: She is alert and oriented to person, place, and time.  Skin: Skin is warm and dry. She is not diaphoretic. No erythema. There is pallor.  Psychiatric: Affect normal.        CMP Latest Ref Rng & Units 01/12/2019  Glucose 70 - 99 mg/dL 94  BUN 8 - 23 mg/dL 20  Creatinine 0.44 - 1.00 mg/dL 0.37(L)  Sodium 135 - 145 mmol/L 132(L)  Potassium 3.5 - 5.1 mmol/L 3.5  Chloride 98 - 111 mmol/L 104  CO2 22 - 32 mmol/L 23  Calcium 8.9 - 10.3 mg/dL 7.0(L)  Total Protein 6.5 - 8.1 g/dL 4.3(L)  Total Bilirubin 0.3 - 1.2 mg/dL 0.4  Alkaline Phos 38 - 126 U/L 28(L)  AST 15 - 41 U/L 24  ALT 0 - 44 U/L 16   CBC Latest Ref Rng & Units 01/12/2019  WBC 4.0 - 10.5 K/uL -  Hemoglobin 12.0 - 15.0 g/dL 9.1(L)  Hematocrit 36.0 - 46.0 % 27.7(L)  Platelets 150 - 400 K/uL -   RADIOGRAPHIC STUDIES: I have personally reviewed the radiological images as listed and agreed with the findings in the report.  CT Head Wo Contrast  Result Date: 01/11/2019 CLINICAL DATA:  Weakness, fall EXAM: CT HEAD WITHOUT CONTRAST TECHNIQUE: Contiguous axial images were obtained from the base of the skull through the vertex without intravenous contrast. COMPARISON:  None. FINDINGS: Brain: No evidence of acute infarction, hemorrhage, hydrocephalus, extra-axial collection or mass lesion/mass effect. Minimal scattered low-density changes within the periventricular and subcortical white matter suggesting sequela of chronic microvascular ischemic disease. Vascular: No hyperdense vessel or unexpected calcification. Skull: Normal. Negative for fracture or focal lesion. Sinuses/Orbits: Mucosal thickening within the left sphenoid sinus. Orbital  structures appear unremarkable. Other: None. IMPRESSION: 1. No acute intracranial findings. 2. Left sphenoid sinus disease. Electronically Signed   By: Davina Poke M.D.   On: 01/11/2019 23:01   CT Chest Wo Contrast  Result Date: 01/11/2019 CLINICAL DATA:  Weakness and fall with history of abdominal aortic aneurysm EXAM: CT CHEST WITHOUT CONTRAST TECHNIQUE: Multidetector  CT imaging of the chest was performed following the standard protocol without IV contrast. COMPARISON:  01/01/2019 FINDINGS: Cardiovascular: Somewhat limited due to lack of IV contrast. Atherosclerotic calcifications of the thoracic aorta are noted. 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 Coronary calcifications are seen. No cardiac enlargement is noted. Mediastinum/Nodes: Thoracic inlet demonstrates a large multinodular goiter with extension into the superior mediastinum from the left lobe stable from the prior exam. Chronic hiatal hernia is seen. No hilar or mediastinal adenopathy is noted. Lungs/Pleura: The lungs are well aerated bilaterally. No focal infiltrate or sizable effusion is seen. No parenchymal nodules are noted. Chronic scarring in the lingula is seen. Upper Abdomen: Recently reported on CT of the abdomen and pelvis. Musculoskeletal: Degenerative changes of the thoracic spine are seen. Old rib fractures are noted anteriorly stable from the prior exam. Stable thoracic kyphosis is noted. No acute compression deformity is seen. 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 Recommend annual imaging followup by CTA or MRA. This recommendation follows 2010 ACCF/AHA/AATS/ACR/ASA/SCA/SCAI/SIR/STS/SVM Guidelines for the Diagnosis and Management of Patients with Thoracic Aortic Disease. Circulation. 2010; [id number]. Aortic aneurysm NOS (ICD10-I71.9) Large multinodular goiter stable from the prior exam. Hiatal hernia stable from the previous exam. Aortic Atherosclerosis (ICD10-I70.0).  Electronically Signed   By: Inez Catalina M.D.   On: 01/11/2019 22:54   CT Angio Chest PE W and/or Wo Contrast  Result Date: 01/01/2019 CLINICAL DATA:  Chest pain and shortness of breath. The pain radiates to the back. Aneurysmal dilatation of the ascending thoracic aorta. Positive D-dimer. EXAM: CT ANGIOGRAPHY CHEST WITH CONTRAST TECHNIQUE: Multidetector CT imaging of the chest was performed using the standard protocol during bolus administration of intravenous contrast. Multiplanar CT image reconstructions and MIPs were obtained to evaluate the vascular anatomy. CONTRAST:  64mL OMNIPAQUE IOHEXOL 350 MG/ML SOLN COMPARISON:  CT scan dated 03/20/2018 FINDINGS: Cardiovascular: Aortic atherosclerosis. The ascending thoracic aorta is stable at 4.2 cm. Heart size is normal. Aberrant right subclavian artery. No aortic dissection. No pulmonary emboli. Mediastinum/Nodes: Stable multinodular goiter. Large chronic hiatal hernia, more prominent than on the prior study. Lungs/Pleura: Lungs are clear. No pleural effusion or pneumothorax. Upper Abdomen: No acute abnormality. Musculoskeletal: No chest wall abnormality. No acute or significant osseous findings. Chronic accentuation of the thoracic kyphosis. Review of the MIP images confirms the above findings. IMPRESSION: 1. No acute abnormalities. Specifically, no pulmonary emboli. 2. 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 3. Stable multinodular goiter. 4. Large chronic hiatal hernia, more prominent than on the prior study. 5. Aortic Atherosclerosis (ICD10-I70.0). Electronically Signed   By: Lorriane Shire M.D.   On: 01/01/2019 20:20   CT ABDOMEN PELVIS W CONTRAST  Result Date: 01/11/2019 CLINICAL DATA:  Acute abdominal pain. Intermittent right upper quadrant abdominal pain. EXAM: CT ABDOMEN AND PELVIS WITH CONTRAST TECHNIQUE: Multidetector CT imaging of the abdomen and pelvis was performed using the standard protocol following bolus  administration of intravenous contrast. CONTRAST:  1107mL OMNIPAQUE IOHEXOL 300 MG/ML  SOLN COMPARISON:  None. FINDINGS: Lower chest: The lung bases are clear. The heart size is normal. Hepatobiliary: The liver is normal. Cholelithiasis without acute inflammation.There is no biliary ductal dilation. Pancreas: Normal contours without ductal dilatation. No peripancreatic fluid collection. Spleen: No splenic laceration or hematoma. Adrenals/Urinary Tract: --Adrenal glands: No adrenal hemorrhage. --Right kidney/ureter: No hydronephrosis or perinephric hematoma. --Left kidney/ureter: No hydronephrosis or perinephric hematoma. --Urinary bladder: Unremarkable. Stomach/Bowel: --Stomach/Duodenum: There is a large hiatal hernia. --Small  bowel: No dilatation or inflammation. --Colon: There is a ventral wall hernia containing a loop of the transverse colon without evidence for an obstruction. There are postsurgical changes of the right hemicolon without evidence for obstruction. --Appendix: Likely surgically absent. Vascular/Lymphatic: Atherosclerotic calcification is present within the non-aneurysmal abdominal aorta, without hemodynamically significant stenosis. --No retroperitoneal lymphadenopathy. --there are some enlarged paraesophageal and gastrohepatic ligament lymph nodes --No pelvic or inguinal lymphadenopathy. Reproductive: There is a 1.6 cm left ovarian cystic structure. The patient is status post prior hysterectomy. Other: No ascites or free air. The abdominal wall is normal. Musculoskeletal. There is age-indeterminate height loss of L3 vertebral body, presumably chronic. IMPRESSION: 1. No acute abdominopelvic process. 2. Ventral wall hernia containing a loop of transverse colon without evidence for obstruction. 3. Large hiatal hernia. 4. Cholelithiasis without acute inflammation. 5. A 1.6 cm left ovarian cystic structure. Further evaluation with a nonemergent outpatient pelvic ultrasound is recommended in 6 months.  6. Age-indeterminate L3 vertebral body height loss, presumably chronic. 7. Mildly enlarged paraesophageal lymph nodes, presumably reactive. Aortic Atherosclerosis (ICD10-I70.0). Electronically Signed   By: Constance Holster M.D.   On: 01/11/2019 22:54   US Venous Img Lower Bilateral  Result Date: 01/01/2019 CLINICAL DATA:  Shortness of breath, elevated D-dimer. EXAM: BILATERAL LOWER EXTREMITY VENOUS DOPPLER ULTRASOUND TECHNIQUE: Gray-scale sonography with graded compression, as well as color Doppler and duplex ultrasound were performed to evaluate the lower extremity deep venous systems from the level of the common femoral vein and including the common femoral, femoral, profunda femoral, popliteal and calf veins including the posterior tibial, peroneal and gastrocnemius veins when visible. The superficial great saphenous vein was also interrogated. Spectral Doppler was utilized to evaluate flow at rest and with distal augmentation maneuvers in the common femoral, femoral and popliteal veins. COMPARISON:  None. FINDINGS: RIGHT LOWER EXTREMITY Common Femoral Vein: No evidence of thrombus. Normal compressibility, respiratory phasicity and response to augmentation. Saphenofemoral Junction: No evidence of thrombus. Normal compressibility and flow on color Doppler imaging. Profunda Femoral Vein: No evidence of thrombus. Normal compressibility and flow on color Doppler imaging. Femoral Vein: No evidence of thrombus. Normal compressibility, respiratory phasicity and response to augmentation. Popliteal Vein: No evidence of thrombus. Normal compressibility, respiratory phasicity and response to augmentation. Calf Veins: Limited visualization, unremarkable. Superficial Great Saphenous Vein: No evidence of thrombus. Normal compressibility. Venous Reflux:  None. Other Findings:  None. LEFT LOWER EXTREMITY Common Femoral Vein: No evidence of thrombus. Normal compressibility, respiratory phasicity and response to  augmentation. Saphenofemoral Junction: No evidence of thrombus. Normal compressibility and flow on color Doppler imaging. Profunda Femoral Vein: No evidence of thrombus. Normal compressibility and flow on color Doppler imaging. Femoral Vein: No evidence of thrombus. Normal compressibility, respiratory phasicity and response to augmentation. Popliteal Vein: No evidence of thrombus. Normal compressibility, respiratory phasicity and response to augmentation. Calf Veins: No evidence of thrombus. Normal compressibility and flow on color Doppler imaging. Superficial Great Saphenous Vein: No evidence of thrombus. Normal compressibility. Venous Reflux:  None. Other Findings:  None. IMPRESSION: No evidence of deep venous thrombosis in either lower extremity. Electronically Signed   By: Rolm Baptise M.D.   On: 01/01/2019 22:39   DG Chest Port 1 View  Result Date: 01/11/2019 CLINICAL DATA:  Syncopal episode. Sepsis. EXAM: PORTABLE CHEST 1 VIEW COMPARISON:  January 01, 2019 FINDINGS: The heart size is stable from prior study. Aortic calcifications are noted. The thoracic aorta is tortuous. Hiatal hernia is again noted. There may be trace bilateral pleural effusions. There  is an old fracture of the proximal left humerus. There are multiple old healed left-sided rib fractures. There is no pneumothorax. No large pleural effusion. IMPRESSION: 1. Possible trace bilateral pleural effusions. 2. Hiatal hernia. 3. No acute cardiopulmonary process. 4. Stable old left-sided rib fractures. Old left proximal humerus fracture. Electronically Signed   By: Constance Holster M.D.   On: 01/11/2019 19:54   DG Chest Port 1 View  Result Date: 01/01/2019 CLINICAL DATA:  Chest pain and shortness of breath. Symptoms for 1 week, worse yesterday. EXAM: PORTABLE CHEST 1 VIEW COMPARISON:  Chest CT 03/20/2018. most recent radiograph 04/08/2010. FINDINGS: Borderline cardiomegaly. Aortic atherosclerosis and tortuosity, similar to prior exams.  Retrocardiac hiatal hernia. Diffuse bronchial and interstitial thickening, likely smoking related. Streaky bibasilar atelectasis. No confluent airspace disease. No pleural effusion or pneumothorax. Remote left rib fractures. Bones are under mineralized. IMPRESSION: 1. Borderline cardiomegaly. 2. Aortic atherosclerosis and tortuosity, similar to prior exams. 3. Diffuse bronchial and interstitial thickening, likely smoking related. Streaky bibasilar atelectasis. Aortic Atherosclerosis (ICD10-I70.0). Electronically Signed   By: Keith Rake M.D.   On: 01/01/2019 18:42   US Abdomen Limited RUQ  Result Date: 01/11/2019 CLINICAL DATA:  Right upper quadrant pain and jaundice EXAM: ULTRASOUND ABDOMEN LIMITED RIGHT UPPER QUADRANT COMPARISON:  None. FINDINGS: Gallbladder: Gallbladder is well distended with evidence of gallstone within. No wall thickening or pericholecystic fluid is noted. Phrygian cap is seen with a large stone. Common bile duct: Diameter: 5 mm Liver: No focal lesion identified. Within normal limits in parenchymal echogenicity. Portal vein is patent on color Doppler imaging with normal direction of blood flow towards the liver. Other: None. IMPRESSION: Cholelithiasis without complicating factors. A Phrygian cap is seen with a stone noted within. Electronically Signed   By: Inez Catalina M.D.   On: 01/11/2019 20:39    Assessment and plan- Patient is a 68 y.o. female presented for evaluation for near syncope event, abdominal pain was found to have profoundly low hemoglobin level, hypotension  #Acute GI bleeding, Status post EGD today.  Esophagus and gastric area with blood, sprayed with hemostatic agent. Status post multiple PRBC transfusion with goal of maintain hemoglobin above 7. Close monitoring H&H every 6 hours. Labs reviewed.  No evidence of significant hemolysis.   Acute anemia secondary to blood loss due to GI bleed Continue Protonix gtt.   #Vitamin B12 deficiency Recommend to  start on parenteral  #Secondary erythrocytosis, outpatient follow-up with Dr. Mike Gip   Thank you for allowing me to participate in the care of this patient.    Earlie Server, MD, PhD Hematology Oncology [facility] at [facility] Pager- [phone] 01/12/2019

## 2019-01-12 NOTE — Progress Notes (Signed)
At approximately 1105 patient rang call bell c/o nausea and impending bowel movement. Patient began with two episodes bright red hematemesis and one large episode black bloody stool. Dr. Ronelle Nigh made aware at 1110 who explained they were waiting for COVID test to result before they took her, and she needed to be stable before the procedure (See Dr. Michele Mcalpine note). Patient's BP continued to drop steadily (see vital flowsheet) and charge RN Theadora Rama to the patient's room to assess. Patient transferred to room 17 and fluid resuscitation with an additional unit of PRBC initiated with Dr. Mal Misty at bedside. Report given and patient care handed over to [facility]., RN and [employer] RN.

## 2019-01-12 NOTE — Consult Note (Signed)
CRITICAL CARE PROGRESS NOTE    Name: Alicia Holder MRN: [id number] DOB: 12-06-1950     LOS: 1   SUBJECTIVE FINDINGS & SIGNIFICANT EVENTS   Patient description:  This is a pleasant 68 year old female with essential hypertension, COPD, dyslipidemia, large multinodular goiter, chronic hiatal hernia, 4 cm thoracic aortic aneurysm ,lifelong smoker who came in with hematemesis and melena admitted to hospitalist service for acute blood loss anemia secondary to upper GI bleed.  She was noted to be hypotensive while in the ED however incremented with blood transfusion without requirement of vasopressor support.  She had received 2 L IV fluids as well as blood with a net fluid balance +5 L.  Critical care consultation was placed by hospitalist service for further management of acute blood loss anemia secondary to GI bleed. Patient had EGD with blood found in esophagus and stomach with hemostatic spray applied. During my evaluation patient is hemodynamically stable, alert and oriented X3 in no distress.    Lines / Drains: PIV x2   Cultures / Sepsis markers: Blood cultures x2 COVID-19 negative  Antibiotics: Status post cefepime and Flagyl narrowed to Rocephin   Protocols / Consultants: GI, IR, vascular surgery, general surgery, hospitalist, PCCM     PAST MEDICAL HISTORY   Past Medical History:  Diagnosis Date  . AAA (abdominal aortic aneurysm) ([place])   . Colon cancer ([place])   . Hypertension      SURGICAL HISTORY   Past Surgical History:  Procedure Laterality Date  . ABDOMINAL HYSTERECTOMY    . APPENDECTOMY       FAMILY HISTORY   Family History  Problem Relation Age of Onset  . CAD Mother   . CAD Father      SOCIAL HISTORY   Social History   Tobacco Use  . Smoking status: Current Some Day  Smoker    Packs/day: 1.00    Years: 41.00    Pack years: 41.00  . Smokeless tobacco: Never Used  Substance Use Topics  . Alcohol use: Yes    Comment: Once every 3 weeks  . Drug use: Never     MEDICATIONS   Current Medication:  Current Facility-Administered Medications:  .  0.9 %  sodium chloride infusion (Manually program via Guardrails IV Fluids), , Intravenous, Once, Jennye Boroughs, MD .  0.9 %  sodium chloride infusion, , Intravenous, Continuous, [place], Bing Neighbors, NP, Stopped at 01/12/19 1213 .  ceFEPIme (MAXIPIME) 2 g in sodium chloride 0.9 % 100 mL IVPB, 2 g, Intravenous, Q12H, Jennye Boroughs, MD .  Chlorhexidine Gluconate Cloth 2 % PADS 6 each, 6 each, Topical, Daily, Aesha Agrawal, MD .  cyanocobalamin ((VITAMIN B-12)) injection 1,000 mcg, 1,000 mcg, Intramuscular, Daily, Jennye Boroughs, MD .  iohexol (OMNIPAQUE) 9 MG/ML oral solution 500 mL, 500 mL, Oral, Once PRN, Nena Polio, MD, 500 mL at 01/11/19 2119 .  norepinephrine (LEVOPHED) 4 mg in sodium chloride 0.9 % 250 mL (0.016 mg/mL) infusion, 0-40 mcg/min, Intravenous, Titrated, Jennye Boroughs, MD .  pantoprazole (PROTONIX) 80 mg in sodium chloride 0.9 % 250 mL (0.32 mg/mL) infusion, 8 mg/hr, Intravenous, Continuous, Ouma, Bing Neighbors, NP, Last Rate: 25 mL/hr at 01/12/19 1025, 8 mg/hr at 01/12/19 1025 .  [START ON 01/15/2019] pantoprazole (PROTONIX) injection 40 mg, 40 mg, Intravenous, Q12H, Ouma, Bing Neighbors, NP    ALLERGIES   Patient has no known allergies.    REVIEW OF SYSTEMS    10 point review of systems conducted is negative except as per  subjective findings  PHYSICAL EXAMINATION   Vital Signs: Temp:  [96.4 F (35.8 C)-98.8 F (37.1 C)] 98.3 F (36.8 C) (12/12 1415) Pulse Rate:  [71-121] 71 (12/12 1415) Resp:  [16-39] 29 (12/12 1415) BP: (65-121)/(46-82) 102/61 (12/12 1415) SpO2:  [92 %-100 %] 100 % (12/12 1415) Weight:  [68 kg-70.8 kg] 70.8 kg (12/12 1235)  GENERAL:  Chronically ill-appearing no apparent distress HEAD: Normocephalic, atraumatic.  EYES: Pupils equal, round, reactive to light.  No scleral icterus.  MOUTH: Moist mucosal membrane. NECK: Supple. No thyromegaly. No nodules. No JVD.  PULMONARY: There to auscultation bilaterally CARDIOVASCULAR: S1 and S2. Regular rate and rhythm. No murmurs, rubs, or gallops.  GASTROINTESTINAL: Soft, nontender, non-distended. No masses. Positive bowel sounds. No hepatosplenomegaly.  MUSCULOSKELETAL: No swelling, clubbing, or edema.  NEUROLOGIC: No focal neurologic deficits SKIN:intact,warm,dry   PERTINENT DATA     Infusions: . sodium chloride Stopped (01/12/19 1213)  . ceFEPime (MAXIPIME) IV    . norepinephrine (LEVOPHED) Adult infusion    . pantoprozole (PROTONIX) infusion 8 mg/hr (01/12/19 1025)   Scheduled Medications: . sodium chloride   Intravenous Once  . Chlorhexidine Gluconate Cloth  6 each Topical Daily  . cyanocobalamin  1,000 mcg Intramuscular Daily  . [START ON 01/15/2019] pantoprazole  40 mg Intravenous Q12H   PRN Medications: iohexol Hemodynamic parameters:   Intake/Output: 12/11 0701 - 12/12 0700 In: 1280 [Blood:1280] Out: -   Ventilator  Settings:       LAB RESULTS:  Basic Metabolic Panel: Recent Labs  Lab 01/11/19 1923 01/11/19 2317 01/12/19 0428  NA 133* 130* 132*  K 5.1 3.7 3.5  CL 100 102 104  CO2 23 23 23   GLUCOSE 137* 111* 94  BUN 28* 25* 20  CREATININE 0.78 0.49 0.37*  CALCIUM 8.0* 7.0* 7.0*   Liver Function Tests: Recent Labs  Lab 01/11/19 1923 01/11/19 2317 01/12/19 0428  AST 28 19 24   ALT 19 17 16   ALKPHOS 36* 33* 28*  BILITOT 0.2*  0.5 0.2* 0.4  PROT 4.9* 4.8* 4.3*  ALBUMIN 2.4* 2.3* 2.0*   Recent Labs  Lab 01/11/19 1923  LIPASE 34   No results for input(s): AMMONIA in the last 168 hours. CBC: Recent Labs  Lab 01/11/19 1923 01/11/19 2317 01/12/19 0428 01/12/19 0817  WBC 21.6* 18.3* 13.8*  --   NEUTROABS 16.0* 14.5*  --   --    HGB 5.9* 5.3* 6.1* 7.0*  HCT 19.0* 16.5* 18.5* 21.5*  MCV 91.8 88.2 88.1  --   PLT 376 304 271  --    Cardiac Enzymes: No results for input(s): CKTOTAL, CKMB, CKMBINDEX, TROPONINI in the last 168 hours. BNP: Invalid input(s): POCBNP CBG: No results for input(s): GLUCAP in the last 168 hours.   IMAGING RESULTS:  Imaging: CT Head Wo Contrast  Result Date: 01/11/2019 CLINICAL DATA:  Weakness, fall EXAM: CT HEAD WITHOUT CONTRAST TECHNIQUE: Contiguous axial images were obtained from the base of the skull through the vertex without intravenous contrast. COMPARISON:  None. FINDINGS: Brain: No evidence of acute infarction, hemorrhage, hydrocephalus, extra-axial collection or mass lesion/mass effect. Minimal scattered low-density changes within the periventricular and subcortical white matter suggesting sequela of chronic microvascular ischemic disease. Vascular: No hyperdense vessel or unexpected calcification. Skull: Normal. Negative for fracture or focal lesion. Sinuses/Orbits: Mucosal thickening within the left sphenoid sinus. Orbital structures appear unremarkable. Other: None. IMPRESSION: 1. No acute intracranial findings. 2. Left sphenoid sinus disease. Electronically Signed   By: Davina Poke M.D.   On: 01/11/2019  23:01   CT Chest Wo Contrast  Result Date: 01/11/2019 CLINICAL DATA:  Weakness and fall with history of abdominal aortic aneurysm EXAM: CT CHEST WITHOUT CONTRAST TECHNIQUE: Multidetector CT imaging of the chest was performed following the standard protocol without IV contrast. COMPARISON:  01/01/2019 FINDINGS: Cardiovascular: Somewhat limited due to lack of IV contrast. Atherosclerotic calcifications of the thoracic aorta are noted. 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 Coronary calcifications are seen. No cardiac enlargement is noted. Mediastinum/Nodes: Thoracic inlet demonstrates a large multinodular goiter with extension into the superior mediastinum  from the left lobe stable from the prior exam. Chronic hiatal hernia is seen. No hilar or mediastinal adenopathy is noted. Lungs/Pleura: The lungs are well aerated bilaterally. No focal infiltrate or sizable effusion is seen. No parenchymal nodules are noted. Chronic scarring in the lingula is seen. Upper Abdomen: Recently reported on CT of the abdomen and pelvis. Musculoskeletal: Degenerative changes of the thoracic spine are seen. Old rib fractures are noted anteriorly stable from the prior exam. Stable thoracic kyphosis is noted. No acute compression deformity is seen. 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 Recommend annual imaging followup by CTA or MRA. This recommendation follows 2010 ACCF/AHA/AATS/ACR/ASA/SCA/SCAI/SIR/STS/SVM Guidelines for the Diagnosis and Management of Patients with Thoracic Aortic Disease. Circulation. 2010; [id number]. Aortic aneurysm NOS (ICD10-I71.9) Large multinodular goiter stable from the prior exam. Hiatal hernia stable from the previous exam. Aortic Atherosclerosis (ICD10-I70.0). Electronically Signed   By: Inez Catalina M.D.   On: 01/11/2019 22:54   CT ABDOMEN PELVIS W CONTRAST  Result Date: 01/11/2019 CLINICAL DATA:  Acute abdominal pain. Intermittent right upper quadrant abdominal pain. EXAM: CT ABDOMEN AND PELVIS WITH CONTRAST TECHNIQUE: Multidetector CT imaging of the abdomen and pelvis was performed using the standard protocol following bolus administration of intravenous contrast. CONTRAST:  158mL OMNIPAQUE IOHEXOL 300 MG/ML  SOLN COMPARISON:  None. FINDINGS: Lower chest: The lung bases are clear. The heart size is normal. Hepatobiliary: The liver is normal. Cholelithiasis without acute inflammation.There is no biliary ductal dilation. Pancreas: Normal contours without ductal dilatation. No peripancreatic fluid collection. Spleen: No splenic laceration or hematoma. Adrenals/Urinary Tract: --Adrenal glands: No adrenal hemorrhage. --Right  kidney/ureter: No hydronephrosis or perinephric hematoma. --Left kidney/ureter: No hydronephrosis or perinephric hematoma. --Urinary bladder: Unremarkable. Stomach/Bowel: --Stomach/Duodenum: There is a large hiatal hernia. --Small bowel: No dilatation or inflammation. --Colon: There is a ventral wall hernia containing a loop of the transverse colon without evidence for an obstruction. There are postsurgical changes of the right hemicolon without evidence for obstruction. --Appendix: Likely surgically absent. Vascular/Lymphatic: Atherosclerotic calcification is present within the non-aneurysmal abdominal aorta, without hemodynamically significant stenosis. --No retroperitoneal lymphadenopathy. --there are some enlarged paraesophageal and gastrohepatic ligament lymph nodes --No pelvic or inguinal lymphadenopathy. Reproductive: There is a 1.6 cm left ovarian cystic structure. The patient is status post prior hysterectomy. Other: No ascites or free air. The abdominal wall is normal. Musculoskeletal. There is age-indeterminate height loss of L3 vertebral body, presumably chronic. IMPRESSION: 1. No acute abdominopelvic process. 2. Ventral wall hernia containing a loop of transverse colon without evidence for obstruction. 3. Large hiatal hernia. 4. Cholelithiasis without acute inflammation. 5. A 1.6 cm left ovarian cystic structure. Further evaluation with a nonemergent outpatient pelvic ultrasound is recommended in 6 months. 6. Age-indeterminate L3 vertebral body height loss, presumably chronic. 7. Mildly enlarged paraesophageal lymph nodes, presumably reactive. Aortic Atherosclerosis (ICD10-I70.0). Electronically Signed   By: Constance Holster M.D.   On: 01/11/2019  22:54   DG Chest Port 1 View  Result Date: 01/11/2019 CLINICAL DATA:  Syncopal episode. Sepsis. EXAM: PORTABLE CHEST 1 VIEW COMPARISON:  January 01, 2019 FINDINGS: The heart size is stable from prior study. Aortic calcifications are noted. The thoracic  aorta is tortuous. Hiatal hernia is again noted. There may be trace bilateral pleural effusions. There is an old fracture of the proximal left humerus. There are multiple old healed left-sided rib fractures. There is no pneumothorax. No large pleural effusion. IMPRESSION: 1. Possible trace bilateral pleural effusions. 2. Hiatal hernia. 3. No acute cardiopulmonary process. 4. Stable old left-sided rib fractures. Old left proximal humerus fracture. Electronically Signed   By: Constance Holster M.D.   On: 01/11/2019 19:54   US Abdomen Limited RUQ  Result Date: 01/11/2019 CLINICAL DATA:  Right upper quadrant pain and jaundice EXAM: ULTRASOUND ABDOMEN LIMITED RIGHT UPPER QUADRANT COMPARISON:  None. FINDINGS: Gallbladder: Gallbladder is well distended with evidence of gallstone within. No wall thickening or pericholecystic fluid is noted. Phrygian cap is seen with a large stone. Common bile duct: Diameter: 5 mm Liver: No focal lesion identified. Within normal limits in parenchymal echogenicity. Portal vein is patent on color Doppler imaging with normal direction of blood flow towards the liver. Other: None. IMPRESSION: Cholelithiasis without complicating factors. A Phrygian cap is seen with a stone noted within. Electronically Signed   By: Inez Catalina M.D.   On: 01/11/2019 20:39      ASSESSMENT AND PLAN      Acute blood loss anemia -Due to GI bleeding, yet unclear location -GI/surgery/IR/vascular on case appreciate input -Status post EGD-esophagus and gastric area with blood was sprayed with hemostatic agent -Status post PRBC transfusion -Close monitoring for hematemesis/melena -Repeat H&H every 6 hours -There is communication between GI and IR as well as vascular surgery and general surgery, pccm, hospitalist service for plan in case there is rebleeding due to inability to visualize exact nidus of hemorrhage. -Protonix gtt. -Empiric Rocephin-no history of varices    Chronic COPD -Continue  DuoNeb nebulizer  Moderate protein calorie malnutrition  -Hold p.o. nourishment at this point due to GI bleeding    ID -continue IV abx as prescibed -follow up cultures  GI/Nutrition GI PROPHYLAXIS as indicated DIET-->TF's as tolerated Constipation protocol as indicated  ENDO - ICU hypoglycemic\Hyperglycemia protocol -check FSBS per protocol   ELECTROLYTES -follow labs as needed -replace as needed -pharmacy consultation   DVT/GI PRX ordered -SCDs  TRANSFUSIONS AS NEEDED MONITOR FSBS ASSESS the need for LABS as needed   Critical care provider statement:    Critical care time (minutes):  32   Critical care time was exclusive of:  Separately billable procedures and treating other patients   Critical care was necessary to treat or prevent imminent or life-threatening deterioration of the following conditions:   Acute blood loss anemia, GI bleed, COPD, moderate protein calorie malnutrition multiple comorbid conditions   Critical care was time spent personally by me on the following activities:  Development of treatment plan with patient or surrogate, discussions with consultants, evaluation of patient's response to treatment, examination of patient, obtaining history from patient or surrogate, ordering and performing treatments and interventions, ordering and review of laboratory studies and re-evaluation of patient's condition.  I assumed direction of critical care for this patient from another provider in my specialty: no    This document was prepared using Dragon voice recognition software and may include unintentional dictation errors.    Ottie Glazier, M.D.  Division of  Pulmonary & [place]

## 2019-01-12 NOTE — Anesthesia Post-op Follow-up Note (Signed)
Anesthesia QCDR form completed.        

## 2019-01-12 NOTE — Progress Notes (Addendum)
  ADDENDUM: Assessment/plan per discussion with oncology please see detailed HPI per admitting   1. Severe symptomatic anemia - Unclear Etiology. Patient presenting with near syncopal episode associated with dizziness. Found to be hypotensive with hemoglobin 5.9 dropped from 12.8 10 days ago. CBC repeated for confirmation which showed hemoglobin 5.3.  - Transfusing 2 units of PRBCs - IVF resuscitation to maintain MAP>65  - H&H monitoring q6h .Transfuse PRN Hgb<7  - Pantoprazole 80mg  IV x1 then gtt 8mg /hr  - NPO - GI Consult for EGD/Colonoscopy  - Hold NSAIDs, steroids, ASA - Given concerns for possible hemolysis, case discussed with on-call hematologist/oncologist Dr. Tasia Catchings who advised to obtain further labs for evaluation. Per oncologist, hemolysis was thought to be less likely given normal LFTs, bilirubin and LDH  - B12, iron panel, ferritin, folate, Coombs test, haptoglobin, reticulocyte pending   2. Right upper quadrant pain -no evidence of obstructive jaundice or fever. Lipase normal  - LFTs and bilirubin normal - Ultrasound right upper quadrant shows cholelithiasis without evidence of cholecystitis  - CT abdomen pelvis also shows cholelithiasis without cholecystitis  3. Leukocytosis : WBC 21.6 suspect this is likely reactive in the setting of severe anemia however given elevated lactic acid of 4.1 which is now improved will continue work up for sepsis  - UA negative  - CXR negative - Empiric abx with cefepime for now may need to de-escalate - Blood Cultures pending  -Trend WBC's and Procalcitonin  4. Secondary polycythemia  - Follows with hematology/oncology Dr. Susy Manor for monthly CBC and phlebotomy  - Hematology/oncology consult placed   5. HTN -patient now hypotensive in the setting of severe anemia  - Hold BP meds for hypertension   6. HLD  + Goal LDL<100  - Simvastatin 20mg  PO qhs   7. COPD - no evidence of exacerbation  - Continue home inhalers   DVT prophylaxis -  Hold anti-coagulation due to active bleed. Will place SCDs     Rufina Falco, DNP, CCRN, FNP-C [facility] Nurse Practitioner Between 7pm to [place] - Pager [phone] Actively using [employer] secure chat messaging  After 7am go to [email or web address] - [email or web address]  [facility] [employer]  [phone]

## 2019-01-12 NOTE — Progress Notes (Signed)
Two hours post transfusion 2 units of PRBC, Hgb 7.0. Dr. Mal Misty aware, ordered one more unit of blood for this patient. Blood bank aware, will prepare PRBC for patient.

## 2019-01-12 NOTE — Progress Notes (Deleted)
TRANSFER REPORT   PATIENT NAME: Alicia Holder    MR#:  [id number]  DATE OF BIRTH:  [birth year]  SUBJECTIVE:  CHIEF COMPLAINT:   Chief Complaint  Patient presents with  . Fall   Brief HPI: 68 y.o. female with pertinent past medical history of carcinoid tumor status post resection (2009), ascending thoracic aortic aneurysm, hypertension, hyperlipidemia, osteoporosis, COPD, tobacco abuse, secondary polycythemia also with hematology/oncology Dr. Nolon Stalls for monthly CBC and phlebotomy presenting to the ED with near syncopal episode associated with dizziness and right sided abdominal pain.   ED course: On arrival to the ED, she was hypothermic with blood pressure 65/50 mm Hg, pulse rate 101 beats/min and RR 33. There were no focal neurological deficits; she was alert and oriented x4,but noted to be very pale. Labs revealed glucose 137, BUN 28, albumin 2.4, lactic acid 4.1, WBC 21.6, RBC 2.07, hemoglobin 5.9, hematocrit 19.0, lipase 34. Given initial concerns for possible sepsis patient received IV fluid bolus was started on empiric IV antibiotics. Chest x-ray was obtained which showed possible trace bilateral pleural effusion, old left-sided rib fractures and left proximal humerus fracture otherwise no active cardiopulmonary process. Ultrasound abdomen right upper quadrant showed cholelithiasis without cholecystitis. Follow-up CT abdomen pelvis again confirms cholelithiasis without acute inflammation otherwise no abnormality. CT chest showed a stable dilation of the ascending aorta without evidence of rupture.   Interim: During the course of her admission she continued to have large bloody stools and drop in her BP.  She was resuscitated with IV fluids and PRBC.  Patient was evaluated by GI who recommended urgent EGD given hemodynamic instability.  DRUG ALLERGIES:  No Known Allergies  VITALS:  Blood pressure 100/67, pulse 84, temperature 97.6 F (36.4 C), temperature source Oral,  resp. rate (!) 22, height 5\' 4"  (1.626 m), weight 70.8 kg, SpO2 100 %.  PHYSICAL EXAMINATION:  Physical Exam  GENERAL:  68 y.o.-year-old patient lying in the bed with no acute distress.  EYES: Pupils equal, round, reactive to light and accommodation. No scleral icterus. Extraocular muscles intact.  HEENT: Head atraumatic, normocephalic. Oropharynx and nasopharynx clear.  NECK:  Supple, no jugular venous distention. No thyroid enlargement, no tenderness.  LUNGS: Normal breath sounds bilaterally, no wheezing, rales,rhonchi or crepitation. No use of accessory muscles of respiration.  CARDIOVASCULAR: S1, S2 normal. No murmurs, rubs, or gallops.  ABDOMEN: Soft, nontender, nondistended. Bowel sounds present. No organomegaly or mass.  EXTREMITIES: No pedal edema, cyanosis, or clubbing.  NEUROLOGIC: Cranial nerves II through XII are intact. Muscle strength 5/5 in all extremities. Sensation intact. Gait not checked.  PSYCHIATRIC: The patient is alert and oriented x 3.  SKIN: No obvious rash, lesion, or ulcer.    LABORATORY PANEL:   CBC Recent Labs  Lab 01/12/19 0428 01/12/19 1847  WBC 13.8*  --   HGB 6.1* 9.1*  HCT 18.5* 27.7*  PLT 271  --    ------------------------------------------------------------------------------------------------------------------  Chemistries  Recent Labs  Lab 01/12/19 0428  NA 132*  K 3.5  CL 104  CO2 23  GLUCOSE 94  BUN 20  CREATININE 0.37*  CALCIUM 7.0*  AST 24  ALT 16  ALKPHOS 28*  BILITOT 0.4   ------------------------------------------------------------------------------------------------------------------  Cardiac Enzymes No results for input(s): TROPONINI in the last 168 hours. ------------------------------------------------------------------------------------------------------------------  RADIOLOGY:  CT Head Wo Contrast  Result Date: 01/11/2019 CLINICAL DATA:  Weakness, fall EXAM: CT HEAD WITHOUT CONTRAST TECHNIQUE: Contiguous  axial images were obtained from the base of the skull through the  vertex without intravenous contrast. COMPARISON:  None. FINDINGS: Brain: No evidence of acute infarction, hemorrhage, hydrocephalus, extra-axial collection or mass lesion/mass effect. Minimal scattered low-density changes within the periventricular and subcortical white matter suggesting sequela of chronic microvascular ischemic disease. Vascular: No hyperdense vessel or unexpected calcification. Skull: Normal. Negative for fracture or focal lesion. Sinuses/Orbits: Mucosal thickening within the left sphenoid sinus. Orbital structures appear unremarkable. Other: None. IMPRESSION: 1. No acute intracranial findings. 2. Left sphenoid sinus disease. Electronically Signed   By: Davina Poke M.D.   On: 01/11/2019 23:01   CT Chest Wo Contrast  Result Date: 01/11/2019 CLINICAL DATA:  Weakness and fall with history of abdominal aortic aneurysm EXAM: CT CHEST WITHOUT CONTRAST TECHNIQUE: Multidetector CT imaging of the chest was performed following the standard protocol without IV contrast. COMPARISON:  01/01/2019 FINDINGS: Cardiovascular: Somewhat limited due to lack of IV contrast. Atherosclerotic calcifications of the thoracic aorta are noted. 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 Coronary calcifications are seen. No cardiac enlargement is noted. Mediastinum/Nodes: Thoracic inlet demonstrates a large multinodular goiter with extension into the superior mediastinum from the left lobe stable from the prior exam. Chronic hiatal hernia is seen. No hilar or mediastinal adenopathy is noted. Lungs/Pleura: The lungs are well aerated bilaterally. No focal infiltrate or sizable effusion is seen. No parenchymal nodules are noted. Chronic scarring in the lingula is seen. Upper Abdomen: Recently reported on CT of the abdomen and pelvis. Musculoskeletal: Degenerative changes of the thoracic spine are seen. Old rib fractures are noted  anteriorly stable from the prior exam. Stable thoracic kyphosis is noted. No acute compression deformity is seen. 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 Recommend annual imaging followup by CTA or MRA. This recommendation follows 2010 ACCF/AHA/AATS/ACR/ASA/SCA/SCAI/SIR/STS/SVM Guidelines for the Diagnosis and Management of Patients with Thoracic Aortic Disease. Circulation. 2010; [id number]. Aortic aneurysm NOS (ICD10-I71.9) Large multinodular goiter stable from the prior exam. Hiatal hernia stable from the previous exam. Aortic Atherosclerosis (ICD10-I70.0). Electronically Signed   By: Inez Catalina M.D.   On: 01/11/2019 22:54   CT ABDOMEN PELVIS W CONTRAST  Result Date: 01/11/2019 CLINICAL DATA:  Acute abdominal pain. Intermittent right upper quadrant abdominal pain. EXAM: CT ABDOMEN AND PELVIS WITH CONTRAST TECHNIQUE: Multidetector CT imaging of the abdomen and pelvis was performed using the standard protocol following bolus administration of intravenous contrast. CONTRAST:  170mL OMNIPAQUE IOHEXOL 300 MG/ML  SOLN COMPARISON:  None. FINDINGS: Lower chest: The lung bases are clear. The heart size is normal. Hepatobiliary: The liver is normal. Cholelithiasis without acute inflammation.There is no biliary ductal dilation. Pancreas: Normal contours without ductal dilatation. No peripancreatic fluid collection. Spleen: No splenic laceration or hematoma. Adrenals/Urinary Tract: --Adrenal glands: No adrenal hemorrhage. --Right kidney/ureter: No hydronephrosis or perinephric hematoma. --Left kidney/ureter: No hydronephrosis or perinephric hematoma. --Urinary bladder: Unremarkable. Stomach/Bowel: --Stomach/Duodenum: There is a large hiatal hernia. --Small bowel: No dilatation or inflammation. --Colon: There is a ventral wall hernia containing a loop of the transverse colon without evidence for an obstruction. There are postsurgical changes of the right hemicolon without evidence for  obstruction. --Appendix: Likely surgically absent. Vascular/Lymphatic: Atherosclerotic calcification is present within the non-aneurysmal abdominal aorta, without hemodynamically significant stenosis. --No retroperitoneal lymphadenopathy. --there are some enlarged paraesophageal and gastrohepatic ligament lymph nodes --No pelvic or inguinal lymphadenopathy. Reproductive: There is a 1.6 cm left ovarian cystic structure. The patient is status post prior hysterectomy. Other: No ascites or free air. The abdominal wall is normal. Musculoskeletal. There  is age-indeterminate height loss of L3 vertebral body, presumably chronic. IMPRESSION: 1. No acute abdominopelvic process. 2. Ventral wall hernia containing a loop of transverse colon without evidence for obstruction. 3. Large hiatal hernia. 4. Cholelithiasis without acute inflammation. 5. A 1.6 cm left ovarian cystic structure. Further evaluation with a nonemergent outpatient pelvic ultrasound is recommended in 6 months. 6. Age-indeterminate L3 vertebral body height loss, presumably chronic. 7. Mildly enlarged paraesophageal lymph nodes, presumably reactive. Aortic Atherosclerosis (ICD10-I70.0). Electronically Signed   By: Constance Holster M.D.   On: 01/11/2019 22:54   DG Chest Port 1 View  Result Date: 01/11/2019 CLINICAL DATA:  Syncopal episode. Sepsis. EXAM: PORTABLE CHEST 1 VIEW COMPARISON:  January 01, 2019 FINDINGS: The heart size is stable from prior study. Aortic calcifications are noted. The thoracic aorta is tortuous. Hiatal hernia is again noted. There may be trace bilateral pleural effusions. There is an old fracture of the proximal left humerus. There are multiple old healed left-sided rib fractures. There is no pneumothorax. No large pleural effusion. IMPRESSION: 1. Possible trace bilateral pleural effusions. 2. Hiatal hernia. 3. No acute cardiopulmonary process. 4. Stable old left-sided rib fractures. Old left proximal humerus fracture.  Electronically Signed   By: Constance Holster M.D.   On: 01/11/2019 19:54   US Abdomen Limited RUQ  Result Date: 01/11/2019 CLINICAL DATA:  Right upper quadrant pain and jaundice EXAM: ULTRASOUND ABDOMEN LIMITED RIGHT UPPER QUADRANT COMPARISON:  None. FINDINGS: Gallbladder: Gallbladder is well distended with evidence of gallstone within. No wall thickening or pericholecystic fluid is noted. Phrygian cap is seen with a large stone. Common bile duct: Diameter: 5 mm Liver: No focal lesion identified. Within normal limits in parenchymal echogenicity. Portal vein is patent on color Doppler imaging with normal direction of blood flow towards the liver. Other: None. IMPRESSION: Cholelithiasis without complicating factors. A Phrygian cap is seen with a stone noted within. Electronically Signed   By: Inez Catalina M.D.   On: 01/11/2019 20:39    EKG:   Orders placed or performed in visit on 01/11/19  . EKG 12-Lead  . EKG 12-Lead  . EKG 12-Lead    ASSESSMENT AND PLAN:   68 y.o. female with pertinent past medical history of carcinoid tumor status post resection (2009), ascending thoracic aortic aneurysm, hypertension, hyperlipidemia, osteoporosis, COPD, tobacco abuse, secondary polycythemia presenting with near syncopal episode associated with dizziness. Found to be hypotensive with hemoglobin 5.9 dropped from 12.8 10 days ago. CBC repeated for confirmation which showed hemoglobin 5.3.initial concerns for acute hemolysis however ruled out normal LFTs, bilirubin and LDH.  1. Acute GI bleed - Upper/Lower, with evidence hemorrhagic shock Positive for hematemesis and  Hematochezia, Melena. S/p post EGD today showing red blood cells in the distal esophagus and blood clot in the gastric fundus - s/p 4 units of PRBCs - IVF resuscitation to maintain MAP>65  - H&H monitoring q6h .Transfuse PRN Hgb<7  - Pantoprazole 80mg  IV x1 then gtt 8mg /hr  - NPO - Hold NSAIDs, steroids, ASA - GI input appreciated - Given  continued bleed with increase risk hemodynamic instability, patient being transferred to [facility] for vascular intervention.  Case previously discussed with Dr. Liana Crocker, IR who is available for intervention.  2. Right upper quadrant pain -improved.  No evidence of obstructive jaundice or fever.  - LFTs and bilirubin normal - Ultrasound right upper quadrant shows cholelithiasis without evidence of cholecystitis  - CT abdomen pelvis also shows cholelithiasis without cholecystitis  3. Leukocytosis : WBC 21.6  suspect this is likely reactive in the setting of severe anemia.  Initial lactic acid of 4.1 now improved.  - UA negative  - CXR negative - Empiric abx with ceftriaxone - Blood Cultures pending  -Trend WBC's and Procalcitonin  4. Secondary polycythemia  - Follows with hematology/oncology Dr. Susy Manor for monthly CBC and phlebotomy   5. HTN -patient now hypotensive in the setting of severe anemia  - Hold BP meds for hypotension  6. HLD  + Goal LDL<100  - Simvastatin 20mg  PO qhs   7. COPD - no evidence of exacerbation  - Continue home inhalers   DVT prophylaxis - Hold anti-coagulation due to active bleed. Will place SCDs     Rufina Falco, DNP, CCRN, FNP-C [facility] Nurse Practitioner Between 7pm to Bernice - Pager [phone] Actively using [employer] secure chat messaging  After 7am go to [email or web address] - [email or web address] [place]  Office  [phone]     All the records are reviewed and case discussed with Care Management/Social Workerr. Management plans discussed with the patient, family and they are in agreement.  CODE STATUS: DNR  TOTAL TIME TAKING CARE OF THIS PATIENT: 50 minutes.    CC: Primary care physician; Ellamae Sia, MD

## 2019-01-12 NOTE — Progress Notes (Addendum)
Progress Note    Alicia Holder  [id number] DOB: May 13, 1950  DOA: 01/11/2019 PCP: Ellamae Sia, MD        Assessment/Plan:   Principal Problem:   Severe anemia   Body mass index is 26.78 kg/m.   Acute blood loss anemia: s/p transfusion of 4 units of packed red blood cells.  Acute GI bleeding: Treat with IV Protonix.  S/p EGD today which showed red blood in the distal esophagus and blood clot in the gastric fundus. Case was discussed with gastroenterologist, Dr. Enriqueta Shutter. She said that patient may need vascular intervention if she rebleeds. I have informed Dr. Liana Crocker, interventional radiologist. He said that he is available for vascular intervention should patient rebleed. However, he said patient will have to be transferred to [facility] without uppercase since he will not be able to perform any interventional procedure at [facility].  Severe hypotension/hemorrhagic shock: Patient systolic blood pressure dropped to 56 while I was at the bedside (for the second time).  2 L of normal saline bolus was ordered to run at the same time via pressure bag. She was also receiving blood at the same time. IV Levophed was started as well.  Leukocytosis: Probably reactive.  Continue empiric IV antibiotics for now.  COPD: Stable.  Bronchodilators as needed.  Case was also discussed with intensivist, Dr. Lanney Gins, and he has graciously accepted the patient in transfer under ICU service.  Total critical care time spent was 50 minutes.     Family Communication/Anticipated D/C date and plan/Code Status   DVT prophylaxis: SCD Code Status: DNR Family Communication: None Disposition Plan: To be determined      Subjective:   Patient was seen earlier this morning and she had no complaints.  She was feeling fine at that time.  She mentioned that she had one episode of hematemesis yesterday.  Later in the day, I was informed by her nurse that patient  had vomited blood and her past black tarry stools.  Her blood pressure had dropped into the 70s.  Objective:    Vitals:   01/12/19 1200 01/12/19 1206 01/12/19 1210 01/12/19 1235  BP: 121/67 121/67 112/63 105/67  Pulse:  93  96  Resp: (!) 27 (!) 23 20 20   Temp:  97.8 F (36.6 C)  (!) 96.4 F (35.8 C)  TempSrc:  Oral  Temporal  SpO2:  100%    Weight:    70.8 kg  Height:    5\' 4"  (1.626 m)    Intake/Output Summary (Last 24 hours) at 01/12/2019 1306 Last data filed at 01/12/2019 1216 Gross per 24 hour  Intake 4223 ml  Output --  Net 4223 ml   Filed Weights   01/11/19 1930 01/12/19 1235  Weight: 68 kg 70.8 kg    Exam:  GEN: NAD SKIN: No rash EYES: EOMI, pale, anicteric ENT: MMM CV: RRR PULM: CTA B ABD: soft, ND, NT, +BS CNS: AAO x 3, non focal EXT: No edema or tenderness   Data Reviewed:   I have personally reviewed following labs and imaging studies:  Labs: Labs show the following:   Basic Metabolic Panel: Recent Labs  Lab 01/11/19 1923 01/11/19 2317 01/12/19 0428  NA 133* 130* 132*  K 5.1 3.7 3.5  CL 100 102 104  CO2 23 23 23   GLUCOSE 137* 111* 94  BUN 28* 25* 20  CREATININE 0.78 0.49 0.37*  CALCIUM 8.0* 7.0* 7.0*   GFR Estimated Creatinine Clearance:  64 mL/min (A) (by C-G formula based on SCr of 0.37 mg/dL (L)). Liver Function Tests: Recent Labs  Lab 01/11/19 1923 01/11/19 2317 01/12/19 0428  AST 28 19 24   ALT 19 17 16   ALKPHOS 36* 33* 28*  BILITOT 0.2*  0.5 0.2* 0.4  PROT 4.9* 4.8* 4.3*  ALBUMIN 2.4* 2.3* 2.0*   Recent Labs  Lab 01/11/19 1923  LIPASE 34   No results for input(s): AMMONIA in the last 168 hours. Coagulation profile Recent Labs  Lab 01/11/19 1923 01/12/19 0428  INR 1.1 1.1    CBC: Recent Labs  Lab 01/11/19 1923 01/11/19 2317 01/12/19 0428 01/12/19 0817  WBC 21.6* 18.3* 13.8*  --   NEUTROABS 16.0* 14.5*  --   --   HGB 5.9* 5.3* 6.1* 7.0*  HCT 19.0* 16.5* 18.5* 21.5*  MCV 91.8 88.2 88.1  --   PLT  376 304 271  --    Cardiac Enzymes: No results for input(s): CKTOTAL, CKMB, CKMBINDEX, TROPONINI in the last 168 hours. BNP (last 3 results) No results for input(s): PROBNP in the last 8760 hours. CBG: No results for input(s): GLUCAP in the last 168 hours. D-Dimer: No results for input(s): DDIMER in the last 72 hours. Hgb A1c: No results for input(s): HGBA1C in the last 72 hours. Lipid Profile: No results for input(s): CHOL, HDL, LDLCALC, TRIG, CHOLHDL, LDLDIRECT in the last 72 hours. Thyroid function studies: No results for input(s): TSH, T4TOTAL, T3FREE, THYROIDAB in the last 72 hours.  Invalid input(s): FREET3 Anemia work up: Recent Labs    01/11/19 2317  VITAMINB12 173*  FOLATE 10.3  FERRITIN 26  TIBC 279  IRON 32  RETICCTPCT 4.8*   Sepsis Labs: Recent Labs  Lab 01/11/19 1923 01/11/19 2144 01/11/19 2317 01/12/19 0428  WBC 21.6*  --  18.3* 13.8*  LATICACIDVEN 4.1* 1.0  --   --     Microbiology Recent Results (from the past 240 hour(s))  Culture, blood (Routine x 2)     Status: None (Preliminary result)   Collection Time: 01/11/19  7:23 PM   Specimen: BLOOD  Result Value Ref Range Status   Specimen Description BLOOD LEFT FOREARM  Final   Special Requests   Final    BOTTLES DRAWN AEROBIC AND ANAEROBIC Blood Culture results may not be optimal due to an excessive volume of blood received in culture bottles   Culture   Final    NO GROWTH < 12 HOURS Performed at [facility], [address] [place], [place] [address]    Report Status PENDING  Incomplete  Culture, blood (Routine x 2)     Status: None (Preliminary result)   Collection Time: 01/11/19  7:41 PM   Specimen: BLOOD  Result Value Ref Range Status   Specimen Description BLOOD LEFT FOREARM  Final   Special Requests   Final    BOTTLES DRAWN AEROBIC AND ANAEROBIC Blood Culture adequate volume   Culture   Final    NO GROWTH < 12 HOURS Performed at [facility], [place]  [place],  [address]    Report Status PENDING  Incomplete  SARS CORONAVIRUS 2 (TAT 6-24 HRS) Nasopharyngeal Nasopharyngeal Swab     Status: None   Collection Time: 01/11/19  9:44 PM   Specimen: Nasopharyngeal Swab  Result Value Ref Range Status   SARS Coronavirus 2 NEGATIVE NEGATIVE Final    Comment: (NOTE) SARS-CoV-2 target nucleic acids are NOT DETECTED. The SARS-CoV-2 RNA is generally detectable in upper and  lower respiratory specimens during the acute phase of infection. Negative results do not preclude SARS-CoV-2 infection, do not rule out co-infections with other pathogens, and should not be used as the sole basis for treatment or other patient management decisions. Negative results must be combined with clinical observations, patient history, and epidemiological information. The expected result is Negative. Fact Sheet for Patients: SugarRoll.be Fact Sheet for Healthcare Providers: https://www.woods-mathews.com/ This test is not yet approved or cleared by the [place] FDA and  has been authorized for detection and/or diagnosis of SARS-CoV-2 by FDA under an Emergency Use Authorization (EUA). This EUA will remain  in effect (meaning this test can be used) for the duration of the COVID-19 declaration under Section 56 4(b)(1) of the Act, 21 U.S.C. section 360bbb-3(b)(1), unless the authorization is terminated or revoked sooner. Performed at [facility], [place] [address], [place], [place] [address]     Procedures and diagnostic studies:  CT Head Wo Contrast  Result Date: 01/11/2019 CLINICAL DATA:  Weakness, fall EXAM: CT HEAD WITHOUT CONTRAST TECHNIQUE: Contiguous axial images were obtained from the base of the skull through the vertex without intravenous contrast. COMPARISON:  None. FINDINGS: Brain: No evidence of acute infarction, hemorrhage, hydrocephalus, extra-axial collection or mass lesion/mass effect. Minimal scattered  low-density changes within the periventricular and subcortical white matter suggesting sequela of chronic microvascular ischemic disease. Vascular: No hyperdense vessel or unexpected calcification. Skull: Normal. Negative for fracture or focal lesion. Sinuses/Orbits: Mucosal thickening within the left sphenoid sinus. Orbital structures appear unremarkable. Other: None. IMPRESSION: 1. No acute intracranial findings. 2. Left sphenoid sinus disease. Electronically Signed   By: Davina Poke M.D.   On: 01/11/2019 23:01   CT Chest Wo Contrast  Result Date: 01/11/2019 CLINICAL DATA:  Weakness and fall with history of abdominal aortic aneurysm EXAM: CT CHEST WITHOUT CONTRAST TECHNIQUE: Multidetector CT imaging of the chest was performed following the standard protocol without IV contrast. COMPARISON:  01/01/2019 FINDINGS: Cardiovascular: Somewhat limited due to lack of IV contrast. Atherosclerotic calcifications of the thoracic aorta are noted. 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 Coronary calcifications are seen. No cardiac enlargement is noted. Mediastinum/Nodes: Thoracic inlet demonstrates a large multinodular goiter with extension into the superior mediastinum from the left lobe stable from the prior exam. Chronic hiatal hernia is seen. No hilar or mediastinal adenopathy is noted. Lungs/Pleura: The lungs are well aerated bilaterally. No focal infiltrate or sizable effusion is seen. No parenchymal nodules are noted. Chronic scarring in the lingula is seen. Upper Abdomen: Recently reported on CT of the abdomen and pelvis. Musculoskeletal: Degenerative changes of the thoracic spine are seen. Old rib fractures are noted anteriorly stable from the prior exam. Stable thoracic kyphosis is noted. No acute compression deformity is seen. 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 Recommend annual imaging followup by CTA or MRA. This recommendation follows 2010  ACCF/AHA/AATS/ACR/ASA/SCA/SCAI/SIR/STS/SVM Guidelines for the Diagnosis and Management of Patients with Thoracic Aortic Disease. Circulation. 2010; [id number]. Aortic aneurysm NOS (ICD10-I71.9) Large multinodular goiter stable from the prior exam. Hiatal hernia stable from the previous exam. Aortic Atherosclerosis (ICD10-I70.0). Electronically Signed   By: Inez Catalina M.D.   On: 01/11/2019 22:54   CT ABDOMEN PELVIS W CONTRAST  Result Date: 01/11/2019 CLINICAL DATA:  Acute abdominal pain. Intermittent right upper quadrant abdominal pain. EXAM: CT ABDOMEN AND PELVIS WITH CONTRAST TECHNIQUE: Multidetector CT imaging of the abdomen and pelvis was performed using the standard protocol following bolus  administration of intravenous contrast. CONTRAST:  168mL OMNIPAQUE IOHEXOL 300 MG/ML  SOLN COMPARISON:  None. FINDINGS: Lower chest: The lung bases are clear. The heart size is normal. Hepatobiliary: The liver is normal. Cholelithiasis without acute inflammation.There is no biliary ductal dilation. Pancreas: Normal contours without ductal dilatation. No peripancreatic fluid collection. Spleen: No splenic laceration or hematoma. Adrenals/Urinary Tract: --Adrenal glands: No adrenal hemorrhage. --Right kidney/ureter: No hydronephrosis or perinephric hematoma. --Left kidney/ureter: No hydronephrosis or perinephric hematoma. --Urinary bladder: Unremarkable. Stomach/Bowel: --Stomach/Duodenum: There is a large hiatal hernia. --Small bowel: No dilatation or inflammation. --Colon: There is a ventral wall hernia containing a loop of the transverse colon without evidence for an obstruction. There are postsurgical changes of the right hemicolon without evidence for obstruction. --Appendix: Likely surgically absent. Vascular/Lymphatic: Atherosclerotic calcification is present within the non-aneurysmal abdominal aorta, without hemodynamically significant stenosis. --No retroperitoneal lymphadenopathy. --there are some enlarged  paraesophageal and gastrohepatic ligament lymph nodes --No pelvic or inguinal lymphadenopathy. Reproductive: There is a 1.6 cm left ovarian cystic structure. The patient is status post prior hysterectomy. Other: No ascites or free air. The abdominal wall is normal. Musculoskeletal. There is age-indeterminate height loss of L3 vertebral body, presumably chronic. IMPRESSION: 1. No acute abdominopelvic process. 2. Ventral wall hernia containing a loop of transverse colon without evidence for obstruction. 3. Large hiatal hernia. 4. Cholelithiasis without acute inflammation. 5. A 1.6 cm left ovarian cystic structure. Further evaluation with a nonemergent outpatient pelvic ultrasound is recommended in 6 months. 6. Age-indeterminate L3 vertebral body height loss, presumably chronic. 7. Mildly enlarged paraesophageal lymph nodes, presumably reactive. Aortic Atherosclerosis (ICD10-I70.0). Electronically Signed   By: Constance Holster M.D.   On: 01/11/2019 22:54   DG Chest Port 1 View  Result Date: 01/11/2019 CLINICAL DATA:  Syncopal episode. Sepsis. EXAM: PORTABLE CHEST 1 VIEW COMPARISON:  January 01, 2019 FINDINGS: The heart size is stable from prior study. Aortic calcifications are noted. The thoracic aorta is tortuous. Hiatal hernia is again noted. There may be trace bilateral pleural effusions. There is an old fracture of the proximal left humerus. There are multiple old healed left-sided rib fractures. There is no pneumothorax. No large pleural effusion. IMPRESSION: 1. Possible trace bilateral pleural effusions. 2. Hiatal hernia. 3. No acute cardiopulmonary process. 4. Stable old left-sided rib fractures. Old left proximal humerus fracture. Electronically Signed   By: Constance Holster M.D.   On: 01/11/2019 19:54   US Abdomen Limited RUQ  Result Date: 01/11/2019 CLINICAL DATA:  Right upper quadrant pain and jaundice EXAM: ULTRASOUND ABDOMEN LIMITED RIGHT UPPER QUADRANT COMPARISON:  None. FINDINGS:  Gallbladder: Gallbladder is well distended with evidence of gallstone within. No wall thickening or pericholecystic fluid is noted. Phrygian cap is seen with a large stone. Common bile duct: Diameter: 5 mm Liver: No focal lesion identified. Within normal limits in parenchymal echogenicity. Portal vein is patent on color Doppler imaging with normal direction of blood flow towards the liver. Other: None. IMPRESSION: Cholelithiasis without complicating factors. A Phrygian cap is seen with a stone noted within. Electronically Signed   By: Inez Catalina M.D.   On: 01/11/2019 20:39    Medications:   . [MAR Hold] sodium chloride   Intravenous Once  . [MAR Hold] cyanocobalamin  1,000 mcg Intramuscular Daily  . [MAR Hold] pantoprazole  40 mg Intravenous Q12H   Continuous Infusions: . sodium chloride Stopped (01/12/19 1213)  . [MAR Hold] ceFEPime (MAXIPIME) IV    . [MAR Hold] norepinephrine (LEVOPHED) Adult infusion    .  pantoprozole (PROTONIX) infusion 8 mg/hr (01/12/19 1025)     LOS: 1 day   Donta Mcinroy  [facility]   *Please refer to [email or web address], password [email or web address] to get updated schedule on who will round on this patient, as hospitalists switch teams weekly. If 7PM-7AM, please contact night-coverage at [email or web address], password [email or web address] for any overnight needs.  01/12/2019, 1:06 PM

## 2019-01-12 NOTE — Anesthesia Preprocedure Evaluation (Addendum)
Anesthesia Evaluation  Patient identified by MRN, date of birth, ID band Patient awake    Reviewed: Allergy & Precautions, H&P , NPO status , reviewed documented beta blocker date and time   Airway Mallampati: II  TM Distance: >3 FB Neck ROM: full    Dental  (+) Edentulous Upper, Poor Dentition, Chipped, Missing   Pulmonary COPD,  COPD inhaler, Current Smoker and Patient abstained from smoking.,  Recent discharge from hospital for exacerbation of COPD    + decreased breath sounds      Cardiovascular hypertension, Normal cardiovascular exam     Neuro/Psych    GI/Hepatic neg GERD  ,Severe GI bleed, ? probable upper source. Given transfusion in ED   Endo/Other    Renal/GU      Musculoskeletal   Abdominal   Peds  Hematology  (+) Blood dyscrasia, anemia ,   Anesthesia Other Findings Past Medical History: No date: AAA (abdominal aortic aneurysm) (HCC) No date: Colon cancer ([place]) No date: Hypertension  Past Surgical History: No date: ABDOMINAL HYSTERECTOMY No date: APPENDECTOMY  BMI    Body Mass Index: 25.73 kg/m   ED: HPI:68 year old female with a history of carcinoid tumor status post resection 2009, thoracic aortic aneurysm, hypertension, COPD, tobacco abuse as well as secondary polycythemia followed with hematologist Dr. Mike Gip for monthly CBC and phlebotomy who presented to the emergency room with a near syncopal episode associated with dizziness and right-sided abdominal pain.     Reproductive/Obstetrics                         Anesthesia Physical Anesthesia Plan  ASA: IV and emergent  Anesthesia Plan: General   Post-op Pain Management:    Induction: Intravenous  PONV Risk Score and Plan: Treatment may vary due to age or medical condition and TIVA  Airway Management Planned: Nasal Cannula and Natural Airway  Additional Equipment:   Intra-op Plan:   Post-operative Plan:    Informed Consent: I have reviewed the patients History and Physical, chart, labs and discussed the procedure including the risks, benefits and alternatives for the proposed anesthesia with the patient or authorized representative who has indicated his/her understanding and acceptance.     Dental Advisory Given  Plan Discussed with: CRNA  Anesthesia Plan Comments:        Anesthesia Quick Evaluation

## 2019-01-13 ENCOUNTER — Inpatient Hospital Stay (HOSPITAL_COMMUNITY)
Admission: EM | Admit: 2019-01-13 | Discharge: 2019-01-18 | DRG: 381 | Disposition: A | Discharge status: Home / Self Care | Payer: Medicare HMO | Source: Other Acute Inpatient Hospital | Attending: Internal Medicine | Admitting: Internal Medicine

## 2019-01-13 DIAGNOSIS — I10 Essential (primary) hypertension: Secondary | ICD-10-CM

## 2019-01-13 DIAGNOSIS — F17200 Nicotine dependence, unspecified, uncomplicated: Secondary | ICD-10-CM | POA: Diagnosis present

## 2019-01-13 DIAGNOSIS — J449 Chronic obstructive pulmonary disease, unspecified: Secondary | ICD-10-CM | POA: Diagnosis present

## 2019-01-13 DIAGNOSIS — D45 Polycythemia vera: Secondary | ICD-10-CM

## 2019-01-13 DIAGNOSIS — Z66 Do not resuscitate: Secondary | ICD-10-CM | POA: Diagnosis present

## 2019-01-13 DIAGNOSIS — K449 Diaphragmatic hernia without obstruction or gangrene: Secondary | ICD-10-CM | POA: Diagnosis present

## 2019-01-13 DIAGNOSIS — Z716 Tobacco abuse counseling: Secondary | ICD-10-CM

## 2019-01-13 DIAGNOSIS — K3189 Other diseases of stomach and duodenum: Secondary | ICD-10-CM | POA: Diagnosis present

## 2019-01-13 DIAGNOSIS — K8020 Calculus of gallbladder without cholecystitis without obstruction: Secondary | ICD-10-CM | POA: Diagnosis present

## 2019-01-13 DIAGNOSIS — Z85038 Personal history of other malignant neoplasm of large intestine: Secondary | ICD-10-CM

## 2019-01-13 DIAGNOSIS — I959 Hypotension, unspecified: Secondary | ICD-10-CM | POA: Diagnosis not present

## 2019-01-13 DIAGNOSIS — K922 Gastrointestinal hemorrhage, unspecified: Secondary | ICD-10-CM | POA: Diagnosis present

## 2019-01-13 DIAGNOSIS — I714 Abdominal aortic aneurysm, without rupture: Secondary | ICD-10-CM | POA: Diagnosis present

## 2019-01-13 DIAGNOSIS — Z79899 Other long term (current) drug therapy: Secondary | ICD-10-CM | POA: Diagnosis not present

## 2019-01-13 DIAGNOSIS — E872 Acidosis: Secondary | ICD-10-CM | POA: Diagnosis present

## 2019-01-13 DIAGNOSIS — Z7983 Long term (current) use of bisphosphonates: Secondary | ICD-10-CM

## 2019-01-13 DIAGNOSIS — Z8249 Family history of ischemic heart disease and other diseases of the circulatory system: Secondary | ICD-10-CM | POA: Diagnosis not present

## 2019-01-13 DIAGNOSIS — K2211 Ulcer of esophagus with bleeding: Principal | ICD-10-CM | POA: Diagnosis present

## 2019-01-13 DIAGNOSIS — Z8719 Personal history of other diseases of the digestive system: Secondary | ICD-10-CM

## 2019-01-13 DIAGNOSIS — D751 Secondary polycythemia: Secondary | ICD-10-CM | POA: Diagnosis present

## 2019-01-13 DIAGNOSIS — K920 Hematemesis: Secondary | ICD-10-CM | POA: Diagnosis present

## 2019-01-13 DIAGNOSIS — J9611 Chronic respiratory failure with hypoxia: Secondary | ICD-10-CM | POA: Diagnosis present

## 2019-01-13 DIAGNOSIS — Z20828 Contact with and (suspected) exposure to other viral communicable diseases: Secondary | ICD-10-CM | POA: Diagnosis present

## 2019-01-13 DIAGNOSIS — D72829 Elevated white blood cell count, unspecified: Secondary | ICD-10-CM | POA: Diagnosis not present

## 2019-01-13 DIAGNOSIS — D62 Acute posthemorrhagic anemia: Secondary | ICD-10-CM | POA: Diagnosis present

## 2019-01-13 DIAGNOSIS — K921 Melena: Secondary | ICD-10-CM | POA: Diagnosis present

## 2019-01-13 DIAGNOSIS — E876 Hypokalemia: Secondary | ICD-10-CM | POA: Diagnosis present

## 2019-01-13 DIAGNOSIS — E785 Hyperlipidemia, unspecified: Secondary | ICD-10-CM | POA: Diagnosis present

## 2019-01-13 DIAGNOSIS — K2101 Gastro-esophageal reflux disease with esophagitis, with bleeding: Secondary | ICD-10-CM | POA: Diagnosis present

## 2019-01-13 LAB — TYPE AND SCREEN
ABO/RH(D): A POS
ABO/RH(D): A POS
Antibody Screen: NEGATIVE
Antibody Screen: NEGATIVE
Unit division: 0
Unit division: 0
Unit division: 0
Unit division: 0
Unit division: 0
Unit division: 0

## 2019-01-13 LAB — BPAM RBC
Blood Product Expiration Date: 202012242359
Blood Product Expiration Date: 202101052359
Blood Product Expiration Date: 202101052359
Blood Product Expiration Date: 202101062359
Blood Product Expiration Date: 202101082359
Blood Product Expiration Date: 202101082359
ISSUE DATE / TIME: 202012120158
ISSUE DATE / TIME: 202012120421
ISSUE DATE / TIME: 202012120928
ISSUE DATE / TIME: 202012121140
ISSUE DATE / TIME: 202012121330
ISSUE DATE / TIME: 202012122030
Unit Type and Rh: 600
Unit Type and Rh: 6200
Unit Type and Rh: 6200
Unit Type and Rh: 6200
Unit Type and Rh: 6200
Unit Type and Rh: 6200

## 2019-01-13 LAB — CBC
HCT: 23.4 % — ABNORMAL LOW (ref 36.0–46.0)
Hemoglobin: 8.3 g/dL — ABNORMAL LOW (ref 12.0–15.0)
MCH: 30 pg (ref 26.0–34.0)
MCHC: 35.5 g/dL (ref 30.0–36.0)
MCV: 84.5 fL (ref 80.0–100.0)
Platelets: 192 10*3/uL (ref 150–400)
RBC: 2.77 MIL/uL — ABNORMAL LOW (ref 3.87–5.11)
RDW: 15.1 % (ref 11.5–15.5)
WBC: 12.1 10*3/uL — ABNORMAL HIGH (ref 4.0–10.5)
nRBC: 0 % (ref 0.0–0.2)

## 2019-01-13 LAB — BASIC METABOLIC PANEL
Anion gap: 5 (ref 5–15)
BUN: 12 mg/dL (ref 8–23)
CO2: 19 mmol/L — ABNORMAL LOW (ref 22–32)
Calcium: 6.2 mg/dL — CL (ref 8.9–10.3)
Chloride: 111 mmol/L (ref 98–111)
Creatinine, Ser: 0.31 mg/dL — ABNORMAL LOW (ref 0.44–1.00)
GFR calc Af Amer: >60 mL/min (ref >60)
GFR calc non Af Amer: >60 mL/min (ref >60)
Glucose, Bld: 88 mg/dL (ref 70–99)
Potassium: 3.1 mmol/L — ABNORMAL LOW (ref 3.5–5.1)
Sodium: 135 mmol/L (ref 135–145)

## 2019-01-13 LAB — ABO/RH: ABO/RH(D): A POS

## 2019-01-13 LAB — PREPARE RBC (CROSSMATCH)

## 2019-01-13 LAB — HAPTOGLOBIN: Haptoglobin: 130 mg/dL (ref 37–355)

## 2019-01-13 LAB — HEMOGLOBIN AND HEMATOCRIT, BLOOD
HCT: 25.5 % — ABNORMAL LOW (ref 36.0–46.0)
HCT: 24.5 % — ABNORMAL LOW (ref 36.0–46.0)
HCT: 22.7 % — ABNORMAL LOW (ref 36.0–46.0)
Hemoglobin: 8.5 g/dL — ABNORMAL LOW (ref 12.0–15.0)
Hemoglobin: 8.4 g/dL — ABNORMAL LOW (ref 12.0–15.0)
Hemoglobin: 7.8 g/dL — ABNORMAL LOW (ref 12.0–15.0)

## 2019-01-13 MED ORDER — IPRATROPIUM-ALBUTEROL 0.5-2.5 (3) MG/3ML IN SOLN: 3.0000 mL | RESPIRATORY_TRACT | Status: DC | PRN

## 2019-01-13 MED ORDER — HYDROMORPHONE HCL 1 MG/ML IJ SOLN
1.0000 mg | INTRAMUSCULAR | Status: DC | PRN
  Administered 2019-01-13 (×3): 1 mg via INTRAVENOUS
  Filled 2019-01-13 (×3): qty 1

## 2019-01-13 MED ORDER — SODIUM CHLORIDE 0.9 % IV SOLN
80.0000 mg | Freq: Once | INTRAVENOUS | Status: DC
  Filled 2019-01-13: qty 80

## 2019-01-13 MED ORDER — SODIUM CHLORIDE 0.9 % IV BOLUS
1000.0000 mL | Freq: Once | INTRAVENOUS | Status: AC
  Administered 2019-01-13: 1000 mL via INTRAVENOUS

## 2019-01-13 MED ORDER — POTASSIUM CHLORIDE IN NACL 20-0.9 MEQ/L-% IV SOLN
INTRAVENOUS | Status: DC
  Administered 2019-01-13 (×2): via INTRAVENOUS
  Filled 2019-01-13 (×4): qty 1000

## 2019-01-13 MED ORDER — PANTOPRAZOLE SODIUM 40 MG IV SOLR: 40.0000 mg | Freq: Two times a day (BID) | INTRAVENOUS | Status: DC

## 2019-01-13 MED ORDER — MOMETASONE FURO-FORMOTEROL FUM 200-5 MCG/ACT IN AERO
2.0000 | INHALATION_SPRAY | Freq: Two times a day (BID) | RESPIRATORY_TRACT | Status: DC
  Administered 2019-01-13 – 2019-01-18 (×11): 2 via RESPIRATORY_TRACT
  Filled 2019-01-13: qty 8.8

## 2019-01-13 MED ORDER — OXYCODONE HCL 5 MG PO TABS
5.0000 mg | ORAL_TABLET | Freq: Three times a day (TID) | ORAL | Status: DC | PRN
  Administered 2019-01-14 – 2019-01-18 (×10): 5 mg via ORAL
  Filled 2019-01-13 (×11): qty 1

## 2019-01-13 MED ORDER — ONDANSETRON HCL 4 MG PO TABS: 4.0000 mg | ORAL_TABLET | Freq: Four times a day (QID) | ORAL | Status: DC | PRN

## 2019-01-13 MED ORDER — SODIUM CHLORIDE 0.9 % IV SOLN
8.0000 mg/h | INTRAVENOUS | Status: DC
  Administered 2019-01-13 – 2019-01-15 (×7): 8 mg/h via INTRAVENOUS
  Filled 2019-01-13 (×8): qty 80

## 2019-01-13 MED ORDER — MORPHINE SULFATE (PF) 2 MG/ML IV SOLN
INTRAVENOUS | Status: AC
  Administered 2019-01-13: 2 mg via INTRAVENOUS
  Filled 2019-01-13: qty 1

## 2019-01-13 MED ORDER — SERTRALINE HCL 50 MG PO TABS
50.0000 mg | ORAL_TABLET | Freq: Every day | ORAL | Status: DC
  Administered 2019-01-13 – 2019-01-18 (×6): 50 mg via ORAL
  Filled 2019-01-13 (×6): qty 1

## 2019-01-13 MED ORDER — ONDANSETRON HCL 4 MG/2ML IJ SOLN: 4.0000 mg | Freq: Four times a day (QID) | INTRAMUSCULAR | Status: DC | PRN

## 2019-01-13 MED ORDER — MORPHINE SULFATE (PF) 2 MG/ML IV SOLN
2.0000 mg | INTRAVENOUS | Status: DC | PRN
  Administered 2019-01-13: 2 mg via INTRAVENOUS
  Filled 2019-01-13: qty 1

## 2019-01-13 MED ORDER — UMECLIDINIUM BROMIDE 62.5 MCG/INH IN AEPB
1.0000 | INHALATION_SPRAY | Freq: Every day | RESPIRATORY_TRACT | Status: DC
  Administered 2019-01-13 – 2019-01-18 (×6): 1 via RESPIRATORY_TRACT
  Filled 2019-01-13: qty 7

## 2019-01-13 NOTE — Progress Notes (Signed)
Patient had 1 episode of bright red blood per rectum, 250 mL.

## 2019-01-13 NOTE — Consult Note (Signed)
Chief Complaint: Patient was seen in consultation today for mesenteric arteriogram with possible embolization at the request of Dr Aileen Fass   Supervising Physician: Corrie Mckusick  Patient Status: [facility] - In-pt  History of Present Illness: Alicia Holder is a 68 y.o. female   Tx to [facility] yesterday from [place] Emesis and BRBPR several times prior to endo Denies NSAIDs or ulcer disease Had an episode of bloody stool this am  Endo 12/12: Red blood was found in the distal esophagus. To stop active bleeding, hemostatic spray was deployed. There was no bleeding at the end of the procedure. Clotted blood was found in the gastric fundus. The scope was withdrawn and replaced with the dual channel endoscope because of bleeding and because of poor visibility. To stop active bleeding, hemostatic spray was deployed. There was no bleeding at the end of the procedure.  Has had no further bleeding since this am Has no pain Hg 8.4 this am BP and Pulse wnl  Request made for possible embolization Dr Earleen Newport has reviewed chart and Endo report  Dr Michail Sermon note from this am: GI bleed with EGD yesterday that showed a large amount of red blood in the distal esophagus and gastric fundus with the source not pinpointed. Hemostatic spray used in both areas by Dr. Bonna Gains. Patient transferred from [facility] after discussion with IR by [facility] docs for possible embolization. Consult called to me stating that IR not sure which site is bleeding and therefore does not want to pursue embolization without repeat EGD.  I suspect source is the gastric fundus. Medically manage with Protonix drip. EGD tomorrow but if hemostatic spray still in place it will be difficult to determine bleeding source. Ice chips and sips of water. NPO p MN for EGD by Dr. Therisa Doyne.     Past Medical History:  Diagnosis Date   AAA (abdominal aortic aneurysm) ([place])    Colon cancer ([place])    Hypertension     Past Surgical History:    Procedure Laterality Date   ABDOMINAL HYSTERECTOMY     APPENDECTOMY      Allergies: Patient has no known allergies.  Medications: Prior to Admission medications   Medication Sig Start Date End Date Taking? Authorizing Provider  acetaminophen (TYLENOL) 500 MG tablet Take 1,000 mg by mouth every 6 (six) hours as needed for mild pain.   Yes [provider]  ADVAIR DISKUS 250-50 MCG/DOSE AEPB Inhale 1 puff into the lungs 2 (two) times daily. 11/14/16  Yes [provider]  albuterol (VENTOLIN HFA) 108 (90 Base) MCG/ACT inhaler Inhale 1-2 puffs into the lungs every 4 (four) hours as needed for wheezing or shortness of breath.  07/09/18  Yes [provider]  alendronate (FOSAMAX) 70 MG tablet Take 70 mg by mouth once a week. 07/26/17  Yes [provider]  ALPRAZolam (XANAX) 0.25 MG tablet Take 1 tablet (0.25 mg total) by mouth 2 (two) times daily as needed for anxiety. 01/05/19  Yes Fritzi Mandes, MD  docusate sodium (COLACE) 100 MG capsule Take 100 mg by mouth daily.   Yes [provider]  ipratropium-albuterol (DUONEB) 0.5-2.5 (3) MG/3ML SOLN Take 3 mLs by nebulization every 4 (four) hours as needed. 01/05/19  Yes Fritzi Mandes, MD  lisinopril (ZESTRIL) 20 MG tablet Take 20 mg by mouth daily.    Yes [provider]  montelukast (SINGULAIR) 10 MG tablet Take 10 mg by mouth at bedtime. 11/14/16  Yes [provider]  oxyCODONE (OXY  IR/ROXICODONE) 5 MG immediate release tablet Take 5 mg by mouth 3 (three) times daily as needed for moderate pain or breakthrough pain.    Yes [provider]  sertraline (ZOLOFT) 50 MG tablet Take 50 mg by mouth daily. 11/14/16  Yes [provider]  simvastatin (ZOCOR) 20 MG tablet Take 20 mg by mouth daily. 11/14/16  Yes [provider]  tiotropium (SPIRIVA) 18 MCG inhalation capsule Place 1 capsule (18 mcg total) into inhaler and inhale daily. 01/05/19  Yes Fritzi Mandes, MD     Family  History  Problem Relation Age of Onset   CAD Mother    CAD Father     Social History   Socioeconomic History   Marital status: Widowed    Spouse name: Not on file   Number of children: Not on file   Years of education: Not on file   Highest education level: Not on file  Occupational History   Not on file  Tobacco Use   Smoking status: Current Some Day Smoker    Packs/day: 1.00    Years: 41.00    Pack years: 41.00   Smokeless tobacco: Never Used  Substance and Sexual Activity   Alcohol use: Yes    Comment: Once every 3 weeks   Drug use: Never   Sexual activity: Not on file  Other Topics Concern   Not on file  Social History Narrative   Not on file   Social Determinants of Health   Financial Resource Strain:    Difficulty of Paying Living Expenses: Not on file  Food Insecurity:    Worried About [place] in the Last Year: Not on file   Ran Out of Food in the Last Year: Not on file  Transportation Needs:    Lack of Transportation (Medical): Not on file   Lack of Transportation (Non-Medical): Not on file  Physical Activity:    Days of Exercise per Week: Not on file   Minutes of Exercise per Session: Not on file  Stress:    Feeling of Stress : Not on file  Social Connections:    Frequency of Communication with Friends and Family: Not on file   Frequency of Social Gatherings with Friends and Family: Not on file   Attends Religious Services: Not on file   Active Member of Clubs or Organizations: Not on file   Attends Archivist Meetings: Not on file   Marital Status: Not on file     Review of Systems: A 12 point ROS discussed and pertinent positives are indicated in the HPI above.  All other systems are negative.  Review of Systems  Constitutional: Positive for activity change and appetite change. Negative for fatigue and fever.  Respiratory: Negative for cough and shortness of breath.   Cardiovascular: Negative  for chest pain.  Gastrointestinal: Positive for blood in stool and nausea. Negative for abdominal pain.  Neurological: Positive for weakness.  Psychiatric/Behavioral: Negative for behavioral problems and confusion.    Vital Signs: BP 123/76 (BP Location: Right Wrist)    Pulse 84    Temp 99.6 F (37.6 C) (Oral)    Resp 18    SpO2 93%   Physical Exam Vitals reviewed.  Cardiovascular:     Rate and Rhythm: Normal rate and regular rhythm.     Heart sounds: Normal heart sounds.  Pulmonary:     Breath sounds: Normal breath sounds.  Abdominal:     Palpations: Abdomen is soft.  Musculoskeletal:  General: Normal range of motion.  Skin:    General: Skin is warm and dry.  Neurological:     Mental Status: She is alert and oriented to person, place, and time.  Psychiatric:        Mood and Affect: Mood normal.        Behavior: Behavior normal.        Thought Content: Thought content normal.        Judgment: Judgment normal.     Imaging: CT Head Wo Contrast  Result Date: 01/11/2019 CLINICAL DATA:  Weakness, fall EXAM: CT HEAD WITHOUT CONTRAST TECHNIQUE: Contiguous axial images were obtained from the base of the skull through the vertex without intravenous contrast. COMPARISON:  None. FINDINGS: Brain: No evidence of acute infarction, hemorrhage, hydrocephalus, extra-axial collection or mass lesion/mass effect. Minimal scattered low-density changes within the periventricular and subcortical white matter suggesting sequela of chronic microvascular ischemic disease. Vascular: No hyperdense vessel or unexpected calcification. Skull: Normal. Negative for fracture or focal lesion. Sinuses/Orbits: Mucosal thickening within the left sphenoid sinus. Orbital structures appear unremarkable. Other: None. IMPRESSION: 1. No acute intracranial findings. 2. Left sphenoid sinus disease. Electronically Signed   By: Davina Poke M.D.   On: 01/11/2019 23:01   CT Chest Wo Contrast  Result Date:  01/11/2019 CLINICAL DATA:  Weakness and fall with history of abdominal aortic aneurysm EXAM: CT CHEST WITHOUT CONTRAST TECHNIQUE: Multidetector CT imaging of the chest was performed following the standard protocol without IV contrast. COMPARISON:  01/01/2019 FINDINGS: Cardiovascular: Somewhat limited due to lack of IV contrast. Atherosclerotic calcifications of the thoracic aorta are noted. 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 Coronary calcifications are seen. No cardiac enlargement is noted. Mediastinum/Nodes: Thoracic inlet demonstrates a large multinodular goiter with extension into the superior mediastinum from the left lobe stable from the prior exam. Chronic hiatal hernia is seen. No hilar or mediastinal adenopathy is noted. Lungs/Pleura: The lungs are well aerated bilaterally. No focal infiltrate or sizable effusion is seen. No parenchymal nodules are noted. Chronic scarring in the lingula is seen. Upper Abdomen: Recently reported on CT of the abdomen and pelvis. Musculoskeletal: Degenerative changes of the thoracic spine are seen. Old rib fractures are noted anteriorly stable from the prior exam. Stable thoracic kyphosis is noted. No acute compression deformity is seen. 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 Recommend annual imaging followup by CTA or MRA. This recommendation follows 2010 ACCF/AHA/AATS/ACR/ASA/SCA/SCAI/SIR/STS/SVM Guidelines for the Diagnosis and Management of Patients with Thoracic Aortic Disease. Circulation. 2010; [id number]. Aortic aneurysm NOS (ICD10-I71.9) Large multinodular goiter stable from the prior exam. Hiatal hernia stable from the previous exam. Aortic Atherosclerosis (ICD10-I70.0). Electronically Signed   By: Inez Catalina M.D.   On: 01/11/2019 22:54   CT Angio Chest PE W and/or Wo Contrast  Result Date: 01/01/2019 CLINICAL DATA:  Chest pain and shortness of breath. The pain radiates to the back. Aneurysmal dilatation of  the ascending thoracic aorta. Positive D-dimer. EXAM: CT ANGIOGRAPHY CHEST WITH CONTRAST TECHNIQUE: Multidetector CT imaging of the chest was performed using the standard protocol during bolus administration of intravenous contrast. Multiplanar CT image reconstructions and MIPs were obtained to evaluate the vascular anatomy. CONTRAST:  29mL OMNIPAQUE IOHEXOL 350 MG/ML SOLN COMPARISON:  CT scan dated 03/20/2018 FINDINGS: Cardiovascular: Aortic atherosclerosis. The ascending thoracic aorta is stable at 4.2 cm. Heart size is normal. Aberrant right subclavian artery. No aortic dissection. No pulmonary emboli. Mediastinum/Nodes: Stable multinodular goiter. Large chronic hiatal  hernia, more prominent than on the prior study. Lungs/Pleura: Lungs are clear. No pleural effusion or pneumothorax. Upper Abdomen: No acute abnormality. Musculoskeletal: No chest wall abnormality. No acute or significant osseous findings. Chronic accentuation of the thoracic kyphosis. Review of the MIP images confirms the above findings. IMPRESSION: 1. No acute abnormalities. Specifically, no pulmonary emboli. 2. 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 3. Stable multinodular goiter. 4. Large chronic hiatal hernia, more prominent than on the prior study. 5. Aortic Atherosclerosis (ICD10-I70.0). Electronically Signed   By: Lorriane Shire M.D.   On: 01/01/2019 20:20   CT ABDOMEN PELVIS W CONTRAST  Result Date: 01/11/2019 CLINICAL DATA:  Acute abdominal pain. Intermittent right upper quadrant abdominal pain. EXAM: CT ABDOMEN AND PELVIS WITH CONTRAST TECHNIQUE: Multidetector CT imaging of the abdomen and pelvis was performed using the standard protocol following bolus administration of intravenous contrast. CONTRAST:  125mL OMNIPAQUE IOHEXOL 300 MG/ML  SOLN COMPARISON:  None. FINDINGS: Lower chest: The lung bases are clear. The heart size is normal. Hepatobiliary: The liver is normal. Cholelithiasis without acute  inflammation.There is no biliary ductal dilation. Pancreas: Normal contours without ductal dilatation. No peripancreatic fluid collection. Spleen: No splenic laceration or hematoma. Adrenals/Urinary Tract: --Adrenal glands: No adrenal hemorrhage. --Right kidney/ureter: No hydronephrosis or perinephric hematoma. --Left kidney/ureter: No hydronephrosis or perinephric hematoma. --Urinary bladder: Unremarkable. Stomach/Bowel: --Stomach/Duodenum: There is a large hiatal hernia. --Small bowel: No dilatation or inflammation. --Colon: There is a ventral wall hernia containing a loop of the transverse colon without evidence for an obstruction. There are postsurgical changes of the right hemicolon without evidence for obstruction. --Appendix: Likely surgically absent. Vascular/Lymphatic: Atherosclerotic calcification is present within the non-aneurysmal abdominal aorta, without hemodynamically significant stenosis. --No retroperitoneal lymphadenopathy. --there are some enlarged paraesophageal and gastrohepatic ligament lymph nodes --No pelvic or inguinal lymphadenopathy. Reproductive: There is a 1.6 cm left ovarian cystic structure. The patient is status post prior hysterectomy. Other: No ascites or free air. The abdominal wall is normal. Musculoskeletal. There is age-indeterminate height loss of L3 vertebral body, presumably chronic. IMPRESSION: 1. No acute abdominopelvic process. 2. Ventral wall hernia containing a loop of transverse colon without evidence for obstruction. 3. Large hiatal hernia. 4. Cholelithiasis without acute inflammation. 5. A 1.6 cm left ovarian cystic structure. Further evaluation with a nonemergent outpatient pelvic ultrasound is recommended in 6 months. 6. Age-indeterminate L3 vertebral body height loss, presumably chronic. 7. Mildly enlarged paraesophageal lymph nodes, presumably reactive. Aortic Atherosclerosis (ICD10-I70.0). Electronically Signed   By: Constance Holster M.D.   On: 01/11/2019  22:54   US Venous Img Lower Bilateral  Result Date: 01/01/2019 CLINICAL DATA:  Shortness of breath, elevated D-dimer. EXAM: BILATERAL LOWER EXTREMITY VENOUS DOPPLER ULTRASOUND TECHNIQUE: Gray-scale sonography with graded compression, as well as color Doppler and duplex ultrasound were performed to evaluate the lower extremity deep venous systems from the level of the common femoral vein and including the common femoral, femoral, profunda femoral, popliteal and calf veins including the posterior tibial, peroneal and gastrocnemius veins when visible. The superficial great saphenous vein was also interrogated. Spectral Doppler was utilized to evaluate flow at rest and with distal augmentation maneuvers in the common femoral, femoral and popliteal veins. COMPARISON:  None. FINDINGS: RIGHT LOWER EXTREMITY Common Femoral Vein: No evidence of thrombus. Normal compressibility, respiratory phasicity and response to augmentation. Saphenofemoral Junction: No evidence of thrombus. Normal compressibility and flow on color Doppler imaging. Profunda Femoral Vein: No evidence of thrombus. Normal compressibility and flow on color Doppler imaging. Femoral  Vein: No evidence of thrombus. Normal compressibility, respiratory phasicity and response to augmentation. Popliteal Vein: No evidence of thrombus. Normal compressibility, respiratory phasicity and response to augmentation. Calf Veins: Limited visualization, unremarkable. Superficial Great Saphenous Vein: No evidence of thrombus. Normal compressibility. Venous Reflux:  None. Other Findings:  None. LEFT LOWER EXTREMITY Common Femoral Vein: No evidence of thrombus. Normal compressibility, respiratory phasicity and response to augmentation. Saphenofemoral Junction: No evidence of thrombus. Normal compressibility and flow on color Doppler imaging. Profunda Femoral Vein: No evidence of thrombus. Normal compressibility and flow on color Doppler imaging. Femoral Vein: No evidence of  thrombus. Normal compressibility, respiratory phasicity and response to augmentation. Popliteal Vein: No evidence of thrombus. Normal compressibility, respiratory phasicity and response to augmentation. Calf Veins: No evidence of thrombus. Normal compressibility and flow on color Doppler imaging. Superficial Great Saphenous Vein: No evidence of thrombus. Normal compressibility. Venous Reflux:  None. Other Findings:  None. IMPRESSION: No evidence of deep venous thrombosis in either lower extremity. Electronically Signed   By: Rolm Baptise M.D.   On: 01/01/2019 22:39   DG Chest Port 1 View  Result Date: 01/11/2019 CLINICAL DATA:  Syncopal episode. Sepsis. EXAM: PORTABLE CHEST 1 VIEW COMPARISON:  January 01, 2019 FINDINGS: The heart size is stable from prior study. Aortic calcifications are noted. The thoracic aorta is tortuous. Hiatal hernia is again noted. There may be trace bilateral pleural effusions. There is an old fracture of the proximal left humerus. There are multiple old healed left-sided rib fractures. There is no pneumothorax. No large pleural effusion. IMPRESSION: 1. Possible trace bilateral pleural effusions. 2. Hiatal hernia. 3. No acute cardiopulmonary process. 4. Stable old left-sided rib fractures. Old left proximal humerus fracture. Electronically Signed   By: Constance Holster M.D.   On: 01/11/2019 19:54   DG Chest Port 1 View  Result Date: 01/01/2019 CLINICAL DATA:  Chest pain and shortness of breath. Symptoms for 1 week, worse yesterday. EXAM: PORTABLE CHEST 1 VIEW COMPARISON:  Chest CT 03/20/2018. most recent radiograph 04/08/2010. FINDINGS: Borderline cardiomegaly. Aortic atherosclerosis and tortuosity, similar to prior exams. Retrocardiac hiatal hernia. Diffuse bronchial and interstitial thickening, likely smoking related. Streaky bibasilar atelectasis. No confluent airspace disease. No pleural effusion or pneumothorax. Remote left rib fractures. Bones are under mineralized.  IMPRESSION: 1. Borderline cardiomegaly. 2. Aortic atherosclerosis and tortuosity, similar to prior exams. 3. Diffuse bronchial and interstitial thickening, likely smoking related. Streaky bibasilar atelectasis. Aortic Atherosclerosis (ICD10-I70.0). Electronically Signed   By: Keith Rake M.D.   On: 01/01/2019 18:42   US Abdomen Limited RUQ  Result Date: 01/11/2019 CLINICAL DATA:  Right upper quadrant pain and jaundice EXAM: ULTRASOUND ABDOMEN LIMITED RIGHT UPPER QUADRANT COMPARISON:  None. FINDINGS: Gallbladder: Gallbladder is well distended with evidence of gallstone within. No wall thickening or pericholecystic fluid is noted. Phrygian cap is seen with a large stone. Common bile duct: Diameter: 5 mm Liver: No focal lesion identified. Within normal limits in parenchymal echogenicity. Portal vein is patent on color Doppler imaging with normal direction of blood flow towards the liver. Other: None. IMPRESSION: Cholelithiasis without complicating factors. A Phrygian cap is seen with a stone noted within. Electronically Signed   By: Inez Catalina M.D.   On: 01/11/2019 20:39    Labs:  CBC: Recent Labs    01/11/19 2317 01/12/19 0428 01/12/19 2347 01/13/19 0245 01/13/19 0624 01/13/19 1029  WBC 18.3* 13.8* 13.2* 12.1*  --   --   HGB 5.3* 6.1* 8.8* 8.3* 8.5* 8.4*  HCT 16.5* 18.5* 25.7*  23.4* 25.5* 24.5*  PLT 304 271 201 192  --   --     COAGS: Recent Labs    01/01/19 1827 01/11/19 1923 01/11/19 2144 01/12/19 0428  INR 1.1 1.1  --  1.1  APTT 30  --  27 29    BMP: Recent Labs    01/11/19 1923 01/11/19 2317 01/12/19 0428 01/13/19 0245  NA 133* 130* 132* 135  K 5.1 3.7 3.5 3.1*  CL 100 102 104 111  CO2 23 23 23  19*  GLUCOSE 137* 111* 94 88  BUN 28* 25* 20 12  CALCIUM 8.0* 7.0* 7.0* 6.2*  CREATININE 0.78 0.49 0.37* 0.31*  GFRNONAA >60 >60 >60 >60  GFRAA >60 >60 >60 >60    LIVER FUNCTION TESTS: Recent Labs    01/02/19 0612 01/11/19 1923 01/11/19 2317 01/12/19 0428    BILITOT 0.7 0.2*   0.5 0.2* 0.4  AST 9* 28 19 24   ALT 7 19 17 16   ALKPHOS 41 36* 33* 28*  PROT 6.5 4.9* 4.8* 4.3*  ALBUMIN 3.0* 2.4* 2.3* 2.0*    TUMOR MARKERS: No results for input(s): AFPTM, CEA, CA199, CHROMGRNA in the last 8760 hours.  Assessment and Plan:  GI Bleed Tx from [facility] yesterday post Endo and 2 areas of hemostatic spray- showing no signs of bleeding Hemodynamically stable now No transfusion at this hospital Hg 8.4 For re Endo tomorrow Will follow result I have spoken to pt at length about mesenteric arteriogram and embolization if deemed necessary She would be willing to proceed if needed   Thank you for this interesting consult.  I greatly enjoyed meeting Alicia Holder and look forward to participating in their care.  A copy of this report was sent to the requesting provider on this date.  Electronically Signed: Lavonia Drafts, PA-C 01/13/2019, 12:28 PM   I spent a total of 40 Minutes    in face to face in clinical consultation, greater than 50% of which was counseling/coordinating care for consideration of mesenteric arteriogram with possible embolization

## 2019-01-13 NOTE — H&P (Addendum)
PCP:   Ellamae Sia, MD   Chief Complaint:  GI bleed  HPI: This is a 68 year old female was admitted at [facility] hospital with GI bleed.  She has severe anemia with initial hemoglobin is 5.9.  She also had right upper quadrant pain.  Since admission she has been transfused a total of 4 units packed red blood cells.  At [facility] she continuedto have large bloody stools and drops in her blood pressure. She was resuscitated with IV fluids and PRBC. Patient was evaluated by GI who recommended urgent EGD given hemodynamic instability. She underwent a EGD yesterday which showed red blood cells in the distal esophagus and blood clot in the gastric fundus but no definite source of her bleeding.  As patient is bleeding significantly with no source being found, she was transferred to [facility]  The patient was discussed with IR for vascular intervention Dr. Earleen Newport aware of the transfer.  Patient's right upper quadrant pain has improved. Her  LFTs and bilirubin normal . Ultrasound right upper quadrant shows cholelithiasis without evidence of cholecystitis. CT abdomen pelvis also shows cholelithiasis also without cholecystitis.  Last week she got SOB and a chest cold. She was hospitalized and discharged last Friday.  Friday she threw up blood, a lot. She got to where she couldn't walk. Her stool was a bit bloody. She had abdominal pain greater in her epigastric region and and right side. She has a h/o a hernia and GERD. She has no h/o stomach ulcers or GI bleed. She seldom eats spicy food. She does not use NSAIDs for  pain except her daily low dose ASA. She had a precancerous polp removed in 2012. Her last colonoscopy was 4 years ago. She is due for a repeat colonoscopy at 5 years. Currently she had a bit of melanotic stool yesteray. She states she has not vomited blood in 24 hours.  History provided by the patient who is weak but alert and oriented.   Review of Systems:  The patient denies anorexia,  fever, weight loss,, vision loss, decreased hearing, hoarseness, chest pain, syncope, dyspnea on exertion, peripheral edema, balance deficits, nausea and vomiting, hematemesis, hemoptysis, abdominal pain, melena, hematochezia, severe indigestion/heartburn, hematuria, incontinence, genital sores, muscle weakness, suspicious skin lesions, transient blindness, difficulty walking, depression, unusual weight change, abnormal bleeding, enlarged lymph nodes, angioedema, and breast masses.  Past Medical History: Past Medical History:  Diagnosis Date  . AAA (abdominal aortic aneurysm) ([place])   . Colon cancer ([place])   . Hypertension    Past Surgical History:  Procedure Laterality Date  . ABDOMINAL HYSTERECTOMY    . APPENDECTOMY      Medications: Prior to Admission medications   Medication Sig Start Date End Date Taking? Authorizing Provider  ADVAIR DISKUS 250-50 MCG/DOSE AEPB Inhale 1 puff into the lungs 2 (two) times daily. 11/14/16   [provider]  albuterol (VENTOLIN HFA) 108 (90 Base) MCG/ACT inhaler Inhale 1-2 puffs into the lungs every 4 (four) hours as needed for wheezing or shortness of breath.  07/09/18   [provider]  alendronate (FOSAMAX) 70 MG tablet Take 70 mg by mouth once a week. 07/26/17   [provider]  ALPRAZolam Duanne Moron) 0.25 MG tablet Take 1 tablet (0.25 mg total) by mouth 2 (two) times daily as needed for anxiety. 01/05/19   Fritzi Mandes, MD  docusate sodium (COLACE) 100 MG capsule Take 100 mg by mouth daily.    [provider]  ipratropium-albuterol (DUONEB) 0.5-2.5 (3) MG/3ML SOLN  Take 3 mLs by nebulization every 4 (four) hours as needed. 01/05/19   Fritzi Mandes, MD  lisinopril (ZESTRIL) 20 MG tablet Take 20 mg by mouth daily.     [provider]  montelukast (SINGULAIR) 10 MG tablet Take 10 mg by mouth at bedtime. 11/14/16   [provider]  oxyCODONE (OXY IR/ROXICODONE) 5 MG immediate release tablet Take 5 mg by mouth 3  (three) times daily as needed for moderate pain or breakthrough pain.     [provider]  sertraline (ZOLOFT) 50 MG tablet Take 50 mg by mouth daily. 11/14/16   [provider]  simvastatin (ZOCOR) 20 MG tablet Take 20 mg by mouth daily. 11/14/16   [provider]  tiotropium (SPIRIVA) 18 MCG inhalation capsule Place 1 capsule (18 mcg total) into inhaler and inhale daily. 01/05/19   Fritzi Mandes, MD    Allergies:  No Known Allergies  Social History:  reports that she has been smoking. She has a 41.00 pack-year smoking history. She has never used smokeless tobacco. She reports current alcohol use. She reports that she does not use drugs.  Family History: Family History  Problem Relation Age of Onset  . CAD Mother   . CAD Father     Physical Exam: Vitals:   01/13/19 0449  BP: 110/72  Pulse: 93  Resp: (!) 32  Temp: 99.6 F (37.6 C)  TempSrc: Oral  SpO2: 96%    General:  Alert and oriented times three, well developed and nourished, weak Eyes: PERRLA, pink conjunctiva, no scleral icterus ENT: Very dry oral mucosa, neck supple, no thyromegaly Lungs: clear to ascultation, no wheeze, no crackles, no use of accessory muscles Cardiovascular: regular rate and rhythm, no regurgitation, no gallops, no murmurs. No carotid bruits, no JVD Abdomen: soft, positive BS, positive tenderness to palpation generally but greater in the epigastric and right upper quadrant, non-distended, no organomegaly, not an acute abdomen GU: not examined Neuro: CN II - XII grossly intact, sensation intact Musculoskeletal: strength 5/5 all extremities, no clubbing, cyanosis or edema Skin: no rash, no subcutaneous crepitation, no decubitus Psych: appropriate patient   Labs on Admission:  Recent Labs    01/12/19 0428 01/13/19 0245  NA 132* 135  K 3.5 3.1*  CL 104 111  CO2 23 19*  GLUCOSE 94 88  BUN 20 12  CREATININE 0.37* 0.31*  CALCIUM 7.0* 6.2*   Recent Labs     01/11/19 2317 01/12/19 0428  AST 19 24  ALT 17 16  ALKPHOS 33* 28*  BILITOT 0.2* 0.4  PROT 4.8* 4.3*  ALBUMIN 2.3* 2.0*   Recent Labs    01/11/19 1923  LIPASE 34   Recent Labs    01/11/19 2317 01/12/19 2347 01/13/19 0245  WBC 18.3* 13.2* 12.1*  NEUTROABS 14.5* 9.9*  --   HGB 5.3* 8.8* 8.3*  HCT 16.5* 25.7* 23.4*  MCV 88.2 88.0 84.5  PLT 304 201 192   No results for input(s): CKTOTAL, CKMB, CKMBINDEX, TROPONINI in the last 72 hours. Invalid input(s): POCBNP No results for input(s): DDIMER in the last 72 hours. No results for input(s): HGBA1C in the last 72 hours. No results for input(s): CHOL, HDL, LDLCALC, TRIG, CHOLHDL, LDLDIRECT in the last 72 hours. No results for input(s): TSH, T4TOTAL, T3FREE, THYROIDAB in the last 72 hours.  Invalid input(s): FREET3 Recent Labs    01/11/19 2317  VITAMINB12 173*  FOLATE 10.3  FERRITIN 26  TIBC 279  IRON 32  RETICCTPCT 4.8*  Micro Results: Recent Results (from the past 240 hour(s))  Culture, blood (Routine x 2)     Status: None (Preliminary result)   Collection Time: 01/11/19  7:23 PM   Specimen: BLOOD  Result Value Ref Range Status   Specimen Description BLOOD LEFT FOREARM  Final   Special Requests   Final    BOTTLES DRAWN AEROBIC AND ANAEROBIC Blood Culture results may not be optimal due to an excessive volume of blood received in culture bottles   Culture   Final    NO GROWTH < 12 HOURS Performed at [facility], [address] [place], [place] [address]    Report Status PENDING  Incomplete  Culture, blood (Routine x 2)     Status: None (Preliminary result)   Collection Time: 01/11/19  7:41 PM   Specimen: BLOOD  Result Value Ref Range Status   Specimen Description BLOOD LEFT FOREARM  Final   Special Requests   Final    BOTTLES DRAWN AEROBIC AND ANAEROBIC Blood Culture adequate volume   Culture   Final    NO GROWTH < 12 HOURS Performed at [facility], [address] [place],  [place] [address]    Report Status PENDING  Incomplete  SARS CORONAVIRUS 2 (TAT 6-24 HRS) Nasopharyngeal Nasopharyngeal Swab     Status: None   Collection Time: 01/11/19  9:44 PM   Specimen: Nasopharyngeal Swab  Result Value Ref Range Status   SARS Coronavirus 2 NEGATIVE NEGATIVE Final    Comment: (NOTE) SARS-CoV-2 target nucleic acids are NOT DETECTED. The SARS-CoV-2 RNA is generally detectable in upper and lower respiratory specimens during the acute phase of infection. Negative results do not preclude SARS-CoV-2 infection, do not rule out co-infections with other pathogens, and should not be used as the sole basis for treatment or other patient management decisions. Negative results must be combined with clinical observations, patient history, and epidemiological information. The expected result is Negative. Fact Sheet for Patients: SugarRoll.be Fact Sheet for Healthcare Providers: https://www.woods-mathews.com/ This test is not yet approved or cleared by the [place] FDA and  has been authorized for detection and/or diagnosis of SARS-CoV-2 by FDA under an Emergency Use Authorization (EUA). This EUA will remain  in effect (meaning this test can be used) for the duration of the COVID-19 declaration under Section 56 4(b)(1) of the Act, 21 U.S.C. section 360bbb-3(b)(1), unless the authorization is terminated or revoked sooner. Performed at [facility], [place] [address], [place], [place] [address]      Radiological Exams on Admission: CT Head Wo Contrast  Result Date: 01/11/2019 CLINICAL DATA:  Weakness, fall EXAM: CT HEAD WITHOUT CONTRAST TECHNIQUE: Contiguous axial images were obtained from the base of the skull through the vertex without intravenous contrast. COMPARISON:  None. FINDINGS: Brain: No evidence of acute infarction, hemorrhage, hydrocephalus, extra-axial collection or mass lesion/mass effect. Minimal scattered low-density  changes within the periventricular and subcortical white matter suggesting sequela of chronic microvascular ischemic disease. Vascular: No hyperdense vessel or unexpected calcification. Skull: Normal. Negative for fracture or focal lesion. Sinuses/Orbits: Mucosal thickening within the left sphenoid sinus. Orbital structures appear unremarkable. Other: None. IMPRESSION: 1. No acute intracranial findings. 2. Left sphenoid sinus disease. Electronically Signed   By: Davina Poke M.D.   On: 01/11/2019 23:01   CT Chest Wo Contrast  Result Date: 01/11/2019 CLINICAL DATA:  Weakness and fall with history of abdominal aortic aneurysm EXAM: CT CHEST WITHOUT CONTRAST TECHNIQUE: Multidetector CT imaging of the chest was  performed following the standard protocol without IV contrast. COMPARISON:  01/01/2019 FINDINGS: Cardiovascular: Somewhat limited due to lack of IV contrast. Atherosclerotic calcifications of the thoracic aorta are noted. 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 Coronary calcifications are seen. No cardiac enlargement is noted. Mediastinum/Nodes: Thoracic inlet demonstrates a large multinodular goiter with extension into the superior mediastinum from the left lobe stable from the prior exam. Chronic hiatal hernia is seen. No hilar or mediastinal adenopathy is noted. Lungs/Pleura: The lungs are well aerated bilaterally. No focal infiltrate or sizable effusion is seen. No parenchymal nodules are noted. Chronic scarring in the lingula is seen. Upper Abdomen: Recently reported on CT of the abdomen and pelvis. Musculoskeletal: Degenerative changes of the thoracic spine are seen. Old rib fractures are noted anteriorly stable from the prior exam. Stable thoracic kyphosis is noted. No acute compression deformity is seen. 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 Recommend annual imaging followup by CTA or MRA. This recommendation follows 2010  ACCF/AHA/AATS/ACR/ASA/SCA/SCAI/SIR/STS/SVM Guidelines for the Diagnosis and Management of Patients with Thoracic Aortic Disease. Circulation. 2010; [id number]. Aortic aneurysm NOS (ICD10-I71.9) Large multinodular goiter stable from the prior exam. Hiatal hernia stable from the previous exam. Aortic Atherosclerosis (ICD10-I70.0). Electronically Signed   By: Inez Catalina M.D.   On: 01/11/2019 22:54   CT ABDOMEN PELVIS W CONTRAST  Result Date: 01/11/2019 CLINICAL DATA:  Acute abdominal pain. Intermittent right upper quadrant abdominal pain. EXAM: CT ABDOMEN AND PELVIS WITH CONTRAST TECHNIQUE: Multidetector CT imaging of the abdomen and pelvis was performed using the standard protocol following bolus administration of intravenous contrast. CONTRAST:  136mL OMNIPAQUE IOHEXOL 300 MG/ML  SOLN COMPARISON:  None. FINDINGS: Lower chest: The lung bases are clear. The heart size is normal. Hepatobiliary: The liver is normal. Cholelithiasis without acute inflammation.There is no biliary ductal dilation. Pancreas: Normal contours without ductal dilatation. No peripancreatic fluid collection. Spleen: No splenic laceration or hematoma. Adrenals/Urinary Tract: --Adrenal glands: No adrenal hemorrhage. --Right kidney/ureter: No hydronephrosis or perinephric hematoma. --Left kidney/ureter: No hydronephrosis or perinephric hematoma. --Urinary bladder: Unremarkable. Stomach/Bowel: --Stomach/Duodenum: There is a large hiatal hernia. --Small bowel: No dilatation or inflammation. --Colon: There is a ventral wall hernia containing a loop of the transverse colon without evidence for an obstruction. There are postsurgical changes of the right hemicolon without evidence for obstruction. --Appendix: Likely surgically absent. Vascular/Lymphatic: Atherosclerotic calcification is present within the non-aneurysmal abdominal aorta, without hemodynamically significant stenosis. --No retroperitoneal lymphadenopathy. --there are some enlarged  paraesophageal and gastrohepatic ligament lymph nodes --No pelvic or inguinal lymphadenopathy. Reproductive: There is a 1.6 cm left ovarian cystic structure. The patient is status post prior hysterectomy. Other: No ascites or free air. The abdominal wall is normal. Musculoskeletal. There is age-indeterminate height loss of L3 vertebral body, presumably chronic. IMPRESSION: 1. No acute abdominopelvic process. 2. Ventral wall hernia containing a loop of transverse colon without evidence for obstruction. 3. Large hiatal hernia. 4. Cholelithiasis without acute inflammation. 5. A 1.6 cm left ovarian cystic structure. Further evaluation with a nonemergent outpatient pelvic ultrasound is recommended in 6 months. 6. Age-indeterminate L3 vertebral body height loss, presumably chronic. 7. Mildly enlarged paraesophageal lymph nodes, presumably reactive. Aortic Atherosclerosis (ICD10-I70.0). Electronically Signed   By: Constance Holster M.D.   On: 01/11/2019 22:54   DG Chest Port 1 View  Result Date: 01/11/2019 CLINICAL DATA:  Syncopal episode. Sepsis. EXAM: PORTABLE CHEST 1 VIEW COMPARISON:  January 01, 2019 FINDINGS: The heart size is stable from prior  study. Aortic calcifications are noted. The thoracic aorta is tortuous. Hiatal hernia is again noted. There may be trace bilateral pleural effusions. There is an old fracture of the proximal left humerus. There are multiple old healed left-sided rib fractures. There is no pneumothorax. No large pleural effusion. IMPRESSION: 1. Possible trace bilateral pleural effusions. 2. Hiatal hernia. 3. No acute cardiopulmonary process. 4. Stable old left-sided rib fractures. Old left proximal humerus fracture. Electronically Signed   By: Constance Holster M.D.   On: 01/11/2019 19:54   US Abdomen Limited RUQ  Result Date: 01/11/2019 CLINICAL DATA:  Right upper quadrant pain and jaundice EXAM: ULTRASOUND ABDOMEN LIMITED RIGHT UPPER QUADRANT COMPARISON:  None. FINDINGS:  Gallbladder: Gallbladder is well distended with evidence of gallstone within. No wall thickening or pericholecystic fluid is noted. Phrygian cap is seen with a large stone. Common bile duct: Diameter: 5 mm Liver: No focal lesion identified. Within normal limits in parenchymal echogenicity. Portal vein is patent on color Doppler imaging with normal direction of blood flow towards the liver. Other: None. IMPRESSION: Cholelithiasis without complicating factors. A Phrygian cap is seen with a stone noted within. Electronically Signed   By: Inez Catalina M.D.   On: 01/11/2019 20:39    Assessment/Plan Present on Admission: GI bleed . Acute posthemorrhagic anemia -Patient was transferred from [facility]. -Serial H&H, and screen and transfuse as needed -IV Protonix drip -Consult IR in a.m. for the possible intervention.  Dr. Liana Crocker, IR aware -Patient S/ppost EGD  12/12 at [facility], showing red blood cells in the distal esophagus and blood clot in the gastric fundus -N.p.o., IV fluid hydration - IVF resuscitation to maintain MAP>65  - H&H monitoring q6h.Transfuse PRN Hgb<7  -Defer to a.m. team GI consult   Leukocytosis  -WBC 21.6 suspect reactive in the setting of severe anemia.Initial lactic acid of 4.1 now improved.  - Blood Cultures ordered and pending from  - UA negative, CXR at [facility] negative to date -Empiric antibiotics with elements.  None ordered here currently  RUQ TTP -Improved but no clear etiology.  No evidence of ascending cholangitis.  Patient without fevers. -LFTs are normal.  CT and right upper quadrant ultrasound are normal -Patient on as needed IV pain medications  Secondary polycythemia  Follows with hematology/oncology Dr. Susy Manor for monthly CBC and phlebotomy  HTN  -Hold BP meds for hypotension -patient now hypotensive in the setting of severe anemia  . Hyperlipidemia -Stable, home meds resumed  COPD -Stable, home meds resumed  1.6 cm left  ovarian cystic structure.  -Further evaluation with a nonemergent outpatient pelvic ultrasound is recommended in 6 months.   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  -Recommend annual imaging followup by CTA or MR  Miscellaneous: Patient has a time during her hospitalization asked to be a full code and DNR.  During my conversation patient wished to be full code however patient may be medically encephalopathic.  Please reclarify further with the patient in the a.m.  Patient's daughter is her power of attorney  SCDs only for DVT prophylaxis   Gopal Malter 01/13/2019, 5:16 AM

## 2019-01-13 NOTE — Consult Note (Signed)
Referring Provider: Dr. Ree Kida Primary Care Physician:  Ellamae Sia, MD Primary Gastroenterologist:  Althia Forts  Reason for Consultation:  GI bleed  HPI: Alicia Holder is a 68 y.o. female transferred from [facility] for embolization following an EGD yesterday where active bleeding was seen in the distal esophagus and gastric fundus. The source could not be pinpointed and GI doctor at [facility] sprayed hemostatic spray on both sites that reportedly stopped the bleeding. Prior to admission at [facility] she had fallen and after the fall she vomited up coffee grounds emesis X 1 and then had 3 bloody bowel movements in the [facility] ER. Hgb 5.3 and was transfused to 9.1. This morning she had a large amount of red blood per rectum (250 cc) without any hematemesis. Denies abdominal pain. Denies NSAIDs. Denies past history of peptic ulcer disease. Personal history of colon polyps with last colonoscopy 4 years ago. Hgb 8.4 this morning. History of carcinoid tumor with resection in 2009. Covid negative.  Past Medical History:  Diagnosis Date  . AAA (abdominal aortic aneurysm) ([place])   . Colon cancer ([place])   . Hypertension     Past Surgical History:  Procedure Laterality Date  . ABDOMINAL HYSTERECTOMY    . APPENDECTOMY      Prior to Admission medications   Medication Sig Start Date End Date Taking? Authorizing Provider  acetaminophen (TYLENOL) 500 MG tablet Take 1,000 mg by mouth every 6 (six) hours as needed for mild pain.   Yes [provider]  ADVAIR DISKUS 250-50 MCG/DOSE AEPB Inhale 1 puff into the lungs 2 (two) times daily. 11/14/16  Yes [provider]  albuterol (VENTOLIN HFA) 108 (90 Base) MCG/ACT inhaler Inhale 1-2 puffs into the lungs every 4 (four) hours as needed for wheezing or shortness of breath.  07/09/18  Yes [provider]  alendronate (FOSAMAX) 70 MG tablet Take 70 mg by mouth once a week. 07/26/17  Yes [provider]  ALPRAZolam (XANAX) 0.25 MG tablet Take  1 tablet (0.25 mg total) by mouth 2 (two) times daily as needed for anxiety. 01/05/19  Yes Fritzi Mandes, MD  docusate sodium (COLACE) 100 MG capsule Take 100 mg by mouth daily.   Yes [provider]  ipratropium-albuterol (DUONEB) 0.5-2.5 (3) MG/3ML SOLN Take 3 mLs by nebulization every 4 (four) hours as needed. 01/05/19  Yes Fritzi Mandes, MD  lisinopril (ZESTRIL) 20 MG tablet Take 20 mg by mouth daily.    Yes [provider]  montelukast (SINGULAIR) 10 MG tablet Take 10 mg by mouth at bedtime. 11/14/16  Yes [provider]  oxyCODONE (OXY IR/ROXICODONE) 5 MG immediate release tablet Take 5 mg by mouth 3 (three) times daily as needed for moderate pain or breakthrough pain.    Yes [provider]  sertraline (ZOLOFT) 50 MG tablet Take 50 mg by mouth daily. 11/14/16  Yes [provider]  simvastatin (ZOCOR) 20 MG tablet Take 20 mg by mouth daily. 11/14/16  Yes [provider]  tiotropium (SPIRIVA) 18 MCG inhalation capsule Place 1 capsule (18 mcg total) into inhaler and inhale daily. 01/05/19  Yes Fritzi Mandes, MD    Scheduled Meds: . mometasone-formoterol  2 puff Inhalation BID  . [START ON 01/16/2019] pantoprazole  40 mg Intravenous Q12H  . sertraline  50 mg Oral Daily  . umeclidinium bromide  1 puff Inhalation Daily   Continuous Infusions: . 0.9 % NaCl with KCl 20 mEq / L 100 mL/hr at 01/13/19 [id number]  . pantoprazole (PROTONIX)  IVPB    . pantoprozole (PROTONIX) infusion 8 mg/hr (01/13/19 0847)   PRN Meds:.HYDROmorphone (DILAUDID) injection, ipratropium-albuterol, ondansetron **OR** ondansetron (ZOFRAN) IV, oxyCODONE  Allergies as of 01/12/2019  . (No Known Allergies)    Family History  Problem Relation Age of Onset  . CAD Mother   . CAD Father     Social History   Socioeconomic History  . Marital status: Widowed    Spouse name: Not on file  . Number of children: Not on file  . Years of education: Not on file  . Highest education  level: Not on file  Occupational History  . Not on file  Tobacco Use  . Smoking status: Current Some Day Smoker    Packs/day: 1.00    Years: 41.00    Pack years: 41.00  . Smokeless tobacco: Never Used  Substance and Sexual Activity  . Alcohol use: Yes    Comment: Once every 3 weeks  . Drug use: Never  . Sexual activity: Not on file  Other Topics Concern  . Not on file  Social History Narrative  . Not on file   Social Determinants of Health   Financial Resource Strain:   . Difficulty of Paying Living Expenses: Not on file  Food Insecurity:   . Worried About Charity fundraiser in the Last Year: Not on file  . Ran Out of Food in the Last Year: Not on file  Transportation Needs:   . Lack of Transportation (Medical): Not on file  . Lack of Transportation (Non-Medical): Not on file  Physical Activity:   . Days of Exercise per Week: Not on file  . Minutes of Exercise per Session: Not on file  Stress:   . Feeling of Stress : Not on file  Social Connections:   . Frequency of Communication with Friends and Family: Not on file  . Frequency of Social Gatherings with Friends and Family: Not on file  . Attends Religious Services: Not on file  . Active Member of Clubs or Organizations: Not on file  . Attends Archivist Meetings: Not on file  . Marital Status: Not on file  Intimate Partner Violence:   . Fear of Current or Ex-Partner: Not on file  . Emotionally Abused: Not on file  . Physically Abused: Not on file  . Sexually Abused: Not on file    Review of Systems: All negative except as stated above in HPI.  Physical Exam: Vital signs: Vitals:   01/13/19 0739 01/13/19 0800  BP:  112/76  Pulse:  88  Resp:  (!) 28  Temp:    SpO2: 99% 97%  T 99.6  Last BM Date: 01/13/19 General:   Lethargic, elderly, Well-developed, well-nourished, pleasant and cooperative in NAD Head: normocephalic, atraumatic Eyes: anicteric sclera ENT: oropharynx clear Neck: supple,  nontender Lungs:  Clear throughout to auscultation.   No wheezes, crackles, or rhonchi. No acute distress. Heart:  Regular rate and rhythm; no murmurs, clicks, rubs,  or gallops. Abdomen: right-sided tenderness with guarding, soft, nondistended, +BS  Rectal:  Deferred Ext: +LE edema  GI:  Lab Results: Recent Labs    01/12/19 0428 01/12/19 2347 01/13/19 0245 01/13/19 0624 01/13/19 1029  WBC 13.8* 13.2* 12.1*  --   --   HGB 6.1* 8.8* 8.3* 8.5* 8.4*  HCT 18.5* 25.7* 23.4* 25.5* 24.5*  PLT 271 201 192  --   --    BMET Recent Labs    01/11/19 2317 01/12/19 0428 01/13/19 0245  NA 130* 132* 135  K 3.7 3.5 3.1*  CL 102 104 111  CO2 23 23 19*  GLUCOSE 111* 94 88  BUN 25* 20 12  CREATININE 0.49 0.37* 0.31*  CALCIUM 7.0* 7.0* 6.2*   LFT Recent Labs    01/11/19 1923 01/12/19 0428  PROT 4.9* 4.3*  ALBUMIN 2.4* 2.0*  AST 28 24  ALT 19 16  ALKPHOS 36* 28*  BILITOT 0.2*  0.5 0.4  BILIDIR <0.1  --   IBILI NOT CALCULATED  --    PT/INR Recent Labs    01/11/19 1923 01/12/19 0428  LABPROT 13.6 13.8  INR 1.1 1.1     Studies/Results: CT Head Wo Contrast  Result Date: 01/11/2019 CLINICAL DATA:  Weakness, fall EXAM: CT HEAD WITHOUT CONTRAST TECHNIQUE: Contiguous axial images were obtained from the base of the skull through the vertex without intravenous contrast. COMPARISON:  None. FINDINGS: Brain: No evidence of acute infarction, hemorrhage, hydrocephalus, extra-axial collection or mass lesion/mass effect. Minimal scattered low-density changes within the periventricular and subcortical white matter suggesting sequela of chronic microvascular ischemic disease. Vascular: No hyperdense vessel or unexpected calcification. Skull: Normal. Negative for fracture or focal lesion. Sinuses/Orbits: Mucosal thickening within the left sphenoid sinus. Orbital structures appear unremarkable. Other: None. IMPRESSION: 1. No acute intracranial findings. 2. Left sphenoid sinus disease.  Electronically Signed   By: Davina Poke M.D.   On: 01/11/2019 23:01   CT Chest Wo Contrast  Result Date: 01/11/2019 CLINICAL DATA:  Weakness and fall with history of abdominal aortic aneurysm EXAM: CT CHEST WITHOUT CONTRAST TECHNIQUE: Multidetector CT imaging of the chest was performed following the standard protocol without IV contrast. COMPARISON:  01/01/2019 FINDINGS: Cardiovascular: Somewhat limited due to lack of IV contrast. Atherosclerotic calcifications of the thoracic aorta are noted. 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 Coronary calcifications are seen. No cardiac enlargement is noted. Mediastinum/Nodes: Thoracic inlet demonstrates a large multinodular goiter with extension into the superior mediastinum from the left lobe stable from the prior exam. Chronic hiatal hernia is seen. No hilar or mediastinal adenopathy is noted. Lungs/Pleura: The lungs are well aerated bilaterally. No focal infiltrate or sizable effusion is seen. No parenchymal nodules are noted. Chronic scarring in the lingula is seen. Upper Abdomen: Recently reported on CT of the abdomen and pelvis. Musculoskeletal: Degenerative changes of the thoracic spine are seen. Old rib fractures are noted anteriorly stable from the prior exam. Stable thoracic kyphosis is noted. No acute compression deformity is seen. 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 Recommend annual imaging followup by CTA or MRA. This recommendation follows 2010 ACCF/AHA/AATS/ACR/ASA/SCA/SCAI/SIR/STS/SVM Guidelines for the Diagnosis and Management of Patients with Thoracic Aortic Disease. Circulation. 2010; [id number]. Aortic aneurysm NOS (ICD10-I71.9) Large multinodular goiter stable from the prior exam. Hiatal hernia stable from the previous exam. Aortic Atherosclerosis (ICD10-I70.0). Electronically Signed   By: Inez Catalina M.D.   On: 01/11/2019 22:54   CT ABDOMEN PELVIS W CONTRAST  Result Date:  01/11/2019 CLINICAL DATA:  Acute abdominal pain. Intermittent right upper quadrant abdominal pain. EXAM: CT ABDOMEN AND PELVIS WITH CONTRAST TECHNIQUE: Multidetector CT imaging of the abdomen and pelvis was performed using the standard protocol following bolus administration of intravenous contrast. CONTRAST:  158mL OMNIPAQUE IOHEXOL 300 MG/ML  SOLN COMPARISON:  None. FINDINGS: Lower chest: The lung bases are clear. The heart size is normal. Hepatobiliary: The liver is normal. Cholelithiasis without acute inflammation.There is no biliary ductal dilation. Pancreas: Normal contours without  ductal dilatation. No peripancreatic fluid collection. Spleen: No splenic laceration or hematoma. Adrenals/Urinary Tract: --Adrenal glands: No adrenal hemorrhage. --Right kidney/ureter: No hydronephrosis or perinephric hematoma. --Left kidney/ureter: No hydronephrosis or perinephric hematoma. --Urinary bladder: Unremarkable. Stomach/Bowel: --Stomach/Duodenum: There is a large hiatal hernia. --Small bowel: No dilatation or inflammation. --Colon: There is a ventral wall hernia containing a loop of the transverse colon without evidence for an obstruction. There are postsurgical changes of the right hemicolon without evidence for obstruction. --Appendix: Likely surgically absent. Vascular/Lymphatic: Atherosclerotic calcification is present within the non-aneurysmal abdominal aorta, without hemodynamically significant stenosis. --No retroperitoneal lymphadenopathy. --there are some enlarged paraesophageal and gastrohepatic ligament lymph nodes --No pelvic or inguinal lymphadenopathy. Reproductive: There is a 1.6 cm left ovarian cystic structure. The patient is status post prior hysterectomy. Other: No ascites or free air. The abdominal wall is normal. Musculoskeletal. There is age-indeterminate height loss of L3 vertebral body, presumably chronic. IMPRESSION: 1. No acute abdominopelvic process. 2. Ventral wall hernia containing a loop  of transverse colon without evidence for obstruction. 3. Large hiatal hernia. 4. Cholelithiasis without acute inflammation. 5. A 1.6 cm left ovarian cystic structure. Further evaluation with a nonemergent outpatient pelvic ultrasound is recommended in 6 months. 6. Age-indeterminate L3 vertebral body height loss, presumably chronic. 7. Mildly enlarged paraesophageal lymph nodes, presumably reactive. Aortic Atherosclerosis (ICD10-I70.0). Electronically Signed   By: Constance Holster M.D.   On: 01/11/2019 22:54   DG Chest Port 1 View  Result Date: 01/11/2019 CLINICAL DATA:  Syncopal episode. Sepsis. EXAM: PORTABLE CHEST 1 VIEW COMPARISON:  January 01, 2019 FINDINGS: The heart size is stable from prior study. Aortic calcifications are noted. The thoracic aorta is tortuous. Hiatal hernia is again noted. There may be trace bilateral pleural effusions. There is an old fracture of the proximal left humerus. There are multiple old healed left-sided rib fractures. There is no pneumothorax. No large pleural effusion. IMPRESSION: 1. Possible trace bilateral pleural effusions. 2. Hiatal hernia. 3. No acute cardiopulmonary process. 4. Stable old left-sided rib fractures. Old left proximal humerus fracture. Electronically Signed   By: Constance Holster M.D.   On: 01/11/2019 19:54   US Abdomen Limited RUQ  Result Date: 01/11/2019 CLINICAL DATA:  Right upper quadrant pain and jaundice EXAM: ULTRASOUND ABDOMEN LIMITED RIGHT UPPER QUADRANT COMPARISON:  None. FINDINGS: Gallbladder: Gallbladder is well distended with evidence of gallstone within. No wall thickening or pericholecystic fluid is noted. Phrygian cap is seen with a large stone. Common bile duct: Diameter: 5 mm Liver: No focal lesion identified. Within normal limits in parenchymal echogenicity. Portal vein is patent on color Doppler imaging with normal direction of blood flow towards the liver. Other: None. IMPRESSION: Cholelithiasis without complicating factors.  A Phrygian cap is seen with a stone noted within. Electronically Signed   By: Inez Catalina M.D.   On: 01/11/2019 20:39    Impression/Plan: GI bleed with EGD yesterday that showed a large amount of red blood in the distal esophagus and gastric fundus with the source not pinpointed. Hemostatic spray used in both areas by Dr. Bonna Gains. Patient transferred from [facility] after discussion with IR by [facility] docs for possible embolization. Consult called to me stating that IR not sure which site is bleeding and therefore does not want to pursue embolization without repeat EGD.  I suspect source is the gastric fundus. Medically manage with Protonix drip. EGD tomorrow but if hemostatic spray still in place it will be difficult to determine bleeding source. Ice chips and sips of water. NPO  p MN for EGD by Dr. Therisa Doyne.   LOS: 0 days   Lear Ng  01/13/2019, 11:12 AM  Questions please call [phone]

## 2019-01-13 NOTE — H&P (View-Only) (Signed)
Referring Provider: Dr. Ree Kida Primary Care Physician:  Ellamae Sia, MD Primary Gastroenterologist:  Althia Forts  Reason for Consultation:  GI bleed  HPI: Alicia Holder is a 68 y.o. female transferred from [facility] for embolization following an EGD yesterday where active bleeding was seen in the distal esophagus and gastric fundus. The source could not be pinpointed and GI doctor at [facility] sprayed hemostatic spray on both sites that reportedly stopped the bleeding. Prior to admission at [facility] she had fallen and after the fall she vomited up coffee grounds emesis X 1 and then had 3 bloody bowel movements in the [facility] ER. Hgb 5.3 and was transfused to 9.1. This morning she had a large amount of red blood per rectum (250 cc) without any hematemesis. Denies abdominal pain. Denies NSAIDs. Denies past history of peptic ulcer disease. Personal history of colon polyps with last colonoscopy 4 years ago. Hgb 8.4 this morning. History of carcinoid tumor with resection in 2009. Covid negative.  Past Medical History:  Diagnosis Date  . AAA (abdominal aortic aneurysm) ([place])   . Colon cancer ([place])   . Hypertension     Past Surgical History:  Procedure Laterality Date  . ABDOMINAL HYSTERECTOMY    . APPENDECTOMY      Prior to Admission medications   Medication Sig Start Date End Date Taking? Authorizing Provider  acetaminophen (TYLENOL) 500 MG tablet Take 1,000 mg by mouth every 6 (six) hours as needed for mild pain.   Yes [provider]  ADVAIR DISKUS 250-50 MCG/DOSE AEPB Inhale 1 puff into the lungs 2 (two) times daily. 11/14/16  Yes [provider]  albuterol (VENTOLIN HFA) 108 (90 Base) MCG/ACT inhaler Inhale 1-2 puffs into the lungs every 4 (four) hours as needed for wheezing or shortness of breath.  07/09/18  Yes [provider]  alendronate (FOSAMAX) 70 MG tablet Take 70 mg by mouth once a week. 07/26/17  Yes [provider]  ALPRAZolam (XANAX) 0.25 MG tablet Take  1 tablet (0.25 mg total) by mouth 2 (two) times daily as needed for anxiety. 01/05/19  Yes Fritzi Mandes, MD  docusate sodium (COLACE) 100 MG capsule Take 100 mg by mouth daily.   Yes [provider]  ipratropium-albuterol (DUONEB) 0.5-2.5 (3) MG/3ML SOLN Take 3 mLs by nebulization every 4 (four) hours as needed. 01/05/19  Yes Fritzi Mandes, MD  lisinopril (ZESTRIL) 20 MG tablet Take 20 mg by mouth daily.    Yes [provider]  montelukast (SINGULAIR) 10 MG tablet Take 10 mg by mouth at bedtime. 11/14/16  Yes [provider]  oxyCODONE (OXY IR/ROXICODONE) 5 MG immediate release tablet Take 5 mg by mouth 3 (three) times daily as needed for moderate pain or breakthrough pain.    Yes [provider]  sertraline (ZOLOFT) 50 MG tablet Take 50 mg by mouth daily. 11/14/16  Yes [provider]  simvastatin (ZOCOR) 20 MG tablet Take 20 mg by mouth daily. 11/14/16  Yes [provider]  tiotropium (SPIRIVA) 18 MCG inhalation capsule Place 1 capsule (18 mcg total) into inhaler and inhale daily. 01/05/19  Yes Fritzi Mandes, MD    Scheduled Meds: . mometasone-formoterol  2 puff Inhalation BID  . [START ON 01/16/2019] pantoprazole  40 mg Intravenous Q12H  . sertraline  50 mg Oral Daily  . umeclidinium bromide  1 puff Inhalation Daily   Continuous Infusions: . 0.9 % NaCl with KCl 20 mEq / L 100 mL/hr at 01/13/19 [id number]  . pantoprazole (PROTONIX)  IVPB    . pantoprozole (PROTONIX) infusion 8 mg/hr (01/13/19 0847)   PRN Meds:.HYDROmorphone (DILAUDID) injection, ipratropium-albuterol, ondansetron **OR** ondansetron (ZOFRAN) IV, oxyCODONE  Allergies as of 01/12/2019  . (No Known Allergies)    Family History  Problem Relation Age of Onset  . CAD Mother   . CAD Father     Social History   Socioeconomic History  . Marital status: Widowed    Spouse name: Not on file  . Number of children: Not on file  . Years of education: Not on file  . Highest education  level: Not on file  Occupational History  . Not on file  Tobacco Use  . Smoking status: Current Some Day Smoker    Packs/day: 1.00    Years: 41.00    Pack years: 41.00  . Smokeless tobacco: Never Used  Substance and Sexual Activity  . Alcohol use: Yes    Comment: Once every 3 weeks  . Drug use: Never  . Sexual activity: Not on file  Other Topics Concern  . Not on file  Social History Narrative  . Not on file   Social Determinants of Health   Financial Resource Strain:   . Difficulty of Paying Living Expenses: Not on file  Food Insecurity:   . Worried About Charity fundraiser in the Last Year: Not on file  . Ran Out of Food in the Last Year: Not on file  Transportation Needs:   . Lack of Transportation (Medical): Not on file  . Lack of Transportation (Non-Medical): Not on file  Physical Activity:   . Days of Exercise per Week: Not on file  . Minutes of Exercise per Session: Not on file  Stress:   . Feeling of Stress : Not on file  Social Connections:   . Frequency of Communication with Friends and Family: Not on file  . Frequency of Social Gatherings with Friends and Family: Not on file  . Attends Religious Services: Not on file  . Active Member of Clubs or Organizations: Not on file  . Attends Archivist Meetings: Not on file  . Marital Status: Not on file  Intimate Partner Violence:   . Fear of Current or Ex-Partner: Not on file  . Emotionally Abused: Not on file  . Physically Abused: Not on file  . Sexually Abused: Not on file    Review of Systems: All negative except as stated above in HPI.  Physical Exam: Vital signs: Vitals:   01/13/19 0739 01/13/19 0800  BP:  112/76  Pulse:  88  Resp:  (!) 28  Temp:    SpO2: 99% 97%  T 99.6  Last BM Date: 01/13/19 General:   Lethargic, elderly, Well-developed, well-nourished, pleasant and cooperative in NAD Head: normocephalic, atraumatic Eyes: anicteric sclera ENT: oropharynx clear Neck: supple,  nontender Lungs:  Clear throughout to auscultation.   No wheezes, crackles, or rhonchi. No acute distress. Heart:  Regular rate and rhythm; no murmurs, clicks, rubs,  or gallops. Abdomen: right-sided tenderness with guarding, soft, nondistended, +BS  Rectal:  Deferred Ext: +LE edema  GI:  Lab Results: Recent Labs    01/12/19 0428 01/12/19 2347 01/13/19 0245 01/13/19 0624 01/13/19 1029  WBC 13.8* 13.2* 12.1*  --   --   HGB 6.1* 8.8* 8.3* 8.5* 8.4*  HCT 18.5* 25.7* 23.4* 25.5* 24.5*  PLT 271 201 192  --   --    BMET Recent Labs    01/11/19 2317 01/12/19 0428 01/13/19 0245  NA 130* 132* 135  K 3.7 3.5 3.1*  CL 102 104 111  CO2 23 23 19*  GLUCOSE 111* 94 88  BUN 25* 20 12  CREATININE 0.49 0.37* 0.31*  CALCIUM 7.0* 7.0* 6.2*   LFT Recent Labs    01/11/19 1923 01/12/19 0428  PROT 4.9* 4.3*  ALBUMIN 2.4* 2.0*  AST 28 24  ALT 19 16  ALKPHOS 36* 28*  BILITOT 0.2*  0.5 0.4  BILIDIR <0.1  --   IBILI NOT CALCULATED  --    PT/INR Recent Labs    01/11/19 1923 01/12/19 0428  LABPROT 13.6 13.8  INR 1.1 1.1     Studies/Results: CT Head Wo Contrast  Result Date: 01/11/2019 CLINICAL DATA:  Weakness, fall EXAM: CT HEAD WITHOUT CONTRAST TECHNIQUE: Contiguous axial images were obtained from the base of the skull through the vertex without intravenous contrast. COMPARISON:  None. FINDINGS: Brain: No evidence of acute infarction, hemorrhage, hydrocephalus, extra-axial collection or mass lesion/mass effect. Minimal scattered low-density changes within the periventricular and subcortical white matter suggesting sequela of chronic microvascular ischemic disease. Vascular: No hyperdense vessel or unexpected calcification. Skull: Normal. Negative for fracture or focal lesion. Sinuses/Orbits: Mucosal thickening within the left sphenoid sinus. Orbital structures appear unremarkable. Other: None. IMPRESSION: 1. No acute intracranial findings. 2. Left sphenoid sinus disease.  Electronically Signed   By: Davina Poke M.D.   On: 01/11/2019 23:01   CT Chest Wo Contrast  Result Date: 01/11/2019 CLINICAL DATA:  Weakness and fall with history of abdominal aortic aneurysm EXAM: CT CHEST WITHOUT CONTRAST TECHNIQUE: Multidetector CT imaging of the chest was performed following the standard protocol without IV contrast. COMPARISON:  01/01/2019 FINDINGS: Cardiovascular: Somewhat limited due to lack of IV contrast. Atherosclerotic calcifications of the thoracic aorta are noted. 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 Coronary calcifications are seen. No cardiac enlargement is noted. Mediastinum/Nodes: Thoracic inlet demonstrates a large multinodular goiter with extension into the superior mediastinum from the left lobe stable from the prior exam. Chronic hiatal hernia is seen. No hilar or mediastinal adenopathy is noted. Lungs/Pleura: The lungs are well aerated bilaterally. No focal infiltrate or sizable effusion is seen. No parenchymal nodules are noted. Chronic scarring in the lingula is seen. Upper Abdomen: Recently reported on CT of the abdomen and pelvis. Musculoskeletal: Degenerative changes of the thoracic spine are seen. Old rib fractures are noted anteriorly stable from the prior exam. Stable thoracic kyphosis is noted. No acute compression deformity is seen. 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 Recommend annual imaging followup by CTA or MRA. This recommendation follows 2010 ACCF/AHA/AATS/ACR/ASA/SCA/SCAI/SIR/STS/SVM Guidelines for the Diagnosis and Management of Patients with Thoracic Aortic Disease. Circulation. 2010; [id number]. Aortic aneurysm NOS (ICD10-I71.9) Large multinodular goiter stable from the prior exam. Hiatal hernia stable from the previous exam. Aortic Atherosclerosis (ICD10-I70.0). Electronically Signed   By: Inez Catalina M.D.   On: 01/11/2019 22:54   CT ABDOMEN PELVIS W CONTRAST  Result Date:  01/11/2019 CLINICAL DATA:  Acute abdominal pain. Intermittent right upper quadrant abdominal pain. EXAM: CT ABDOMEN AND PELVIS WITH CONTRAST TECHNIQUE: Multidetector CT imaging of the abdomen and pelvis was performed using the standard protocol following bolus administration of intravenous contrast. CONTRAST:  131mL OMNIPAQUE IOHEXOL 300 MG/ML  SOLN COMPARISON:  None. FINDINGS: Lower chest: The lung bases are clear. The heart size is normal. Hepatobiliary: The liver is normal. Cholelithiasis without acute inflammation.There is no biliary ductal dilation. Pancreas: Normal contours without  ductal dilatation. No peripancreatic fluid collection. Spleen: No splenic laceration or hematoma. Adrenals/Urinary Tract: --Adrenal glands: No adrenal hemorrhage. --Right kidney/ureter: No hydronephrosis or perinephric hematoma. --Left kidney/ureter: No hydronephrosis or perinephric hematoma. --Urinary bladder: Unremarkable. Stomach/Bowel: --Stomach/Duodenum: There is a large hiatal hernia. --Small bowel: No dilatation or inflammation. --Colon: There is a ventral wall hernia containing a loop of the transverse colon without evidence for an obstruction. There are postsurgical changes of the right hemicolon without evidence for obstruction. --Appendix: Likely surgically absent. Vascular/Lymphatic: Atherosclerotic calcification is present within the non-aneurysmal abdominal aorta, without hemodynamically significant stenosis. --No retroperitoneal lymphadenopathy. --there are some enlarged paraesophageal and gastrohepatic ligament lymph nodes --No pelvic or inguinal lymphadenopathy. Reproductive: There is a 1.6 cm left ovarian cystic structure. The patient is status post prior hysterectomy. Other: No ascites or free air. The abdominal wall is normal. Musculoskeletal. There is age-indeterminate height loss of L3 vertebral body, presumably chronic. IMPRESSION: 1. No acute abdominopelvic process. 2. Ventral wall hernia containing a loop  of transverse colon without evidence for obstruction. 3. Large hiatal hernia. 4. Cholelithiasis without acute inflammation. 5. A 1.6 cm left ovarian cystic structure. Further evaluation with a nonemergent outpatient pelvic ultrasound is recommended in 6 months. 6. Age-indeterminate L3 vertebral body height loss, presumably chronic. 7. Mildly enlarged paraesophageal lymph nodes, presumably reactive. Aortic Atherosclerosis (ICD10-I70.0). Electronically Signed   By: Constance Holster M.D.   On: 01/11/2019 22:54   DG Chest Port 1 View  Result Date: 01/11/2019 CLINICAL DATA:  Syncopal episode. Sepsis. EXAM: PORTABLE CHEST 1 VIEW COMPARISON:  January 01, 2019 FINDINGS: The heart size is stable from prior study. Aortic calcifications are noted. The thoracic aorta is tortuous. Hiatal hernia is again noted. There may be trace bilateral pleural effusions. There is an old fracture of the proximal left humerus. There are multiple old healed left-sided rib fractures. There is no pneumothorax. No large pleural effusion. IMPRESSION: 1. Possible trace bilateral pleural effusions. 2. Hiatal hernia. 3. No acute cardiopulmonary process. 4. Stable old left-sided rib fractures. Old left proximal humerus fracture. Electronically Signed   By: Constance Holster M.D.   On: 01/11/2019 19:54   US Abdomen Limited RUQ  Result Date: 01/11/2019 CLINICAL DATA:  Right upper quadrant pain and jaundice EXAM: ULTRASOUND ABDOMEN LIMITED RIGHT UPPER QUADRANT COMPARISON:  None. FINDINGS: Gallbladder: Gallbladder is well distended with evidence of gallstone within. No wall thickening or pericholecystic fluid is noted. Phrygian cap is seen with a large stone. Common bile duct: Diameter: 5 mm Liver: No focal lesion identified. Within normal limits in parenchymal echogenicity. Portal vein is patent on color Doppler imaging with normal direction of blood flow towards the liver. Other: None. IMPRESSION: Cholelithiasis without complicating factors.  A Phrygian cap is seen with a stone noted within. Electronically Signed   By: Inez Catalina M.D.   On: 01/11/2019 20:39    Impression/Plan: GI bleed with EGD yesterday that showed a large amount of red blood in the distal esophagus and gastric fundus with the source not pinpointed. Hemostatic spray used in both areas by Dr. Bonna Gains. Patient transferred from [facility] after discussion with IR by [facility] docs for possible embolization. Consult called to me stating that IR not sure which site is bleeding and therefore does not want to pursue embolization without repeat EGD.  I suspect source is the gastric fundus. Medically manage with Protonix drip. EGD tomorrow but if hemostatic spray still in place it will be difficult to determine bleeding source. Ice chips and sips of water. NPO  p MN for EGD by Dr. Therisa Doyne.   LOS: 0 days   Lear Ng  01/13/2019, 11:12 AM  Questions please call [phone]

## 2019-01-13 NOTE — Anesthesia Postprocedure Evaluation (Signed)
Anesthesia Post Note  Patient: Sharonlee S Lackey  Procedure(s) Performed: ESOPHAGOGASTRODUODENOSCOPY (EGD) (N/A )  Patient location during evaluation: ICU Anesthesia Type: General Level of consciousness: awake and alert Pain management: pain level controlled Vital Signs Assessment: post-procedure vital signs reviewed and stable Respiratory status: spontaneous breathing, nonlabored ventilation, respiratory function stable and patient connected to nasal cannula oxygen Cardiovascular status: blood pressure returned to baseline and stable Postop Assessment: no apparent nausea or vomiting Anesthetic complications: no Comments: Initial hypotension post procedure, now off neo and stable and on      Last Vitals:  Vitals:   01/13/19 0315 01/13/19 0316  BP:  113/64  Pulse: 92 97  Resp: 17 20  Temp:  37 C  SpO2: 98% 98%    Last Pain:  Vitals:   01/13/19 0316  TempSrc: Oral  PainSc:                  Alphonsus Sias

## 2019-01-13 NOTE — Progress Notes (Signed)
PROGRESS NOTE    Alicia Holder  [id number] DOB: Sep 05, 1950 DOA: 01/13/2019 PCP: Alicia Sia, MD   Brief Narrative:  HPI on 01/13/2019 by Dr. Quintella Holder This is a 68 year old female was admitted at [facility] hospital with GI bleed.  She has severe anemia with initial hemoglobin is 5.9.  She also had right upper quadrant pain.  Since admission she has been transfused a total of 4 units packed red blood cells.  At [facility] she continuedto have large bloody stools and drops in her blood pressure. She was resuscitated with IV fluids and PRBC. Patient was evaluated by GI who recommended urgent EGD given hemodynamic instability. She underwent a EGD yesterday which showed red blood cells in the distal esophagus and blood clot in the gastric fundus but no definite source of her bleeding.  As patient is bleeding significantly with no source being found, she was transferred to [facility].  The patient was discussed with IR for vascular intervention Dr. Earleen Holder aware of the transfer.  Patient's right upper quadrant pain has improved. Her  LFTs and bilirubin normal . Ultrasound right upper quadrant shows cholelithiasis without evidence of cholecystitis. CT abdomen pelvis also shows cholelithiasis also without cholecystitis.  Last week she got SOB and a chest cold. She was hospitalized and discharged last Friday.  Friday she threw up blood, a lot. She got to where she couldn't walk. Her stool was a bit bloody. She had abdominal pain greater in her epigastric region and and right side. She has a h/o a hernia and GERD. She has no h/o stomach ulcers or GI bleed. She seldom eats spicy food. She does not use NSAIDs for  pain except her daily low dose ASA. She had a precancerous polp removed in 2012. Her last colonoscopy was 4 years ago. She is due for a repeat colonoscopy at 5 years. Currently she had a bit of melanotic stool yesteray. She states she has not vomited blood in 24 hours.  History provided  by the patient who is weak but alert and oriented.  Interim history Patient admitted with GI bleed from [facility].  IR as well as GI consulted and appreciated.  Continue to monitor H&H. Assessment & Plan   Patient admitted earlier today by Dr. Knox Holder, see H&P for details.  Acute GI bleed -Hemoglobin is noted to be as low as 5.3 on 01/11/2019 -Did receive blood transfusions -Current hemoglobin stable at 8.5 -Continue to monitor H&H -Continue IV Protonix -This morning, it was noted the patient had 250 cc of bright red blood per rectum.  She has not had any further hematemesis since admission -Interventional radiology consulted and appreciated -Patient status post EGD on 12/12 at [facility] which showed large amount of red blood in the distal esophagus and gastric fundus with source not pinpointed.  Patient did have hemostatic spray in both areas with no blood noted afterward. -Gastroenterology consulted and appreciated  Leukocytosis with lactic acidosis -Suspect reactive -Recent blood cultures -UA and chest x-ray showed no infection -WBC appears to be improving  RUQ TTP -Improved however no clear etiology -No evidence of a sending cholangitis -LFTs are normal -CT and right upper quadrant ultrasound unremarkable -Continue pain control  Secondary polycythemia -Patient follows with hematology oncology, Dr. 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 Pending further GI and IR recommendations. Dispo TBD  Consultants Gastroenterology Interventional Radiology  Procedures  None  Antibiotics   Anti-infectives (From admission, onward)   None      Subjective:   Alicia Holder seen and examined today.  Patient with lower abdominal pain, no nausea or vomiting.  Denies chest pain or shortness of breath.  Would like to have ice chips.  Objective:   Vitals:   01/13/19 0700 01/13/19 0739 01/13/19 0800 01/13/19 1143  BP: 118/72  112/76 123/76  Pulse: 89  88 84  Resp: 20  (!) 28 18  Temp:      TempSrc:      SpO2: 98% 99% 97% 93%    Intake/Output Summary (Last 24 hours) at 01/13/2019 1316 Last data filed at 01/13/2019 1144 Gross per 24 hour  Intake --  Output 1150 ml  Net -1150 ml   There were no vitals filed for this visit.  Exam See H&P   Data Reviewed: I have personally reviewed following labs and imaging studies  CBC: Recent Labs  Lab 01/11/19 1923 01/11/19 2317 01/12/19 0428 01/12/19 1847 01/12/19 2347 01/13/19 0245 01/13/19 0624 01/13/19 1029  WBC 21.6* 18.3* 13.8*  --  13.2* 12.1*  --   --   NEUTROABS 16.0* 14.5*  --   --  9.9*  --   --   --   HGB 5.9* 5.3* 6.1* 9.1* 8.8* 8.3* 8.5* 8.4*  HCT 19.0* 16.5* 18.5* 27.7* 25.7* 23.4* 25.5* 24.5*  MCV 91.8 88.2 88.1  --  88.0 84.5  --   --   PLT 376 304 271  --  201 192  --   --    Basic Metabolic Panel: Recent Labs  Lab 01/11/19 1923 01/11/19 2317 01/12/19 0428 01/13/19 0245  NA 133* 130* 132* 135  K 5.1 3.7 3.5 3.1*  CL 100 102 104 111  CO2 23 23 23  19*  GLUCOSE 137* 111* 94 88  BUN 28* 25* 20 12  CREATININE 0.78 0.49 0.37* 0.31*  CALCIUM 8.0* 7.0* 7.0*  6.2*   GFR: Estimated Creatinine Clearance: 64 mL/min (A) (by C-G formula based on SCr of 0.31 mg/dL (L)). Liver Function Tests: Recent Labs  Lab 01/11/19 1923 01/11/19 2317 01/12/19 0428  AST 28 19 24   ALT 19 17 16   ALKPHOS 36* 33* 28*  BILITOT 0.2*  0.5 0.2* 0.4  PROT 4.9* 4.8* 4.3*  ALBUMIN 2.4* 2.3* 2.0*   Recent Labs  Lab 01/11/19 1923  LIPASE 34   No results for input(s): AMMONIA in the last 168 hours. Coagulation Profile: Recent Labs  Lab 01/11/19 1923 01/12/19 0428  INR 1.1 1.1   Cardiac Enzymes: No results for input(s): CKTOTAL, CKMB, CKMBINDEX, TROPONINI in the last 168 hours. BNP (last 3 results) No results for input(s): PROBNP in the last 8760 hours. HbA1C: No results for input(s): HGBA1C in the last 72 hours. CBG: No results  for input(s): GLUCAP in the last 168 hours. Lipid Profile: No results for input(s): CHOL, HDL, LDLCALC, TRIG, CHOLHDL, LDLDIRECT in the last 72 hours. Thyroid Function Tests: No results for input(s): TSH, T4TOTAL, FREET4, T3FREE, THYROIDAB in the last 72 hours. Anemia Panel: Recent Labs    01/11/19 2317  VITAMINB12 173*  FOLATE 10.3  FERRITIN 26  TIBC 279  IRON 32  RETICCTPCT 4.8*   Urine analysis:    Component Value Date/Time   COLORURINE YELLOW (A) 01/11/2019 2144   APPEARANCEUR CLEAR (A) 01/11/2019 2144   LABSPEC >1.046 (H) 01/11/2019 2144   PHURINE 6.0 01/11/2019 2144   GLUCOSEU NEGATIVE 01/11/2019 2144   HGBUR SMALL (A) 01/11/2019 2144   [place] NEGATIVE 01/11/2019 2144   KETONESUR 5 (A) 01/11/2019 2144   PROTEINUR NEGATIVE 01/11/2019 2144   NITRITE NEGATIVE 01/11/2019 2144   LEUKOCYTESUR NEGATIVE 01/11/2019 2144   Sepsis Labs: @LABRCNTIP (procalcitonin:4,lacticidven:4)  ) Recent Results (from the past 240 hour(s))  Culture, blood (Routine x 2)     Status: None (Preliminary result)   Collection Time: 01/11/19  7:23 PM   Specimen: BLOOD  Result Value Ref Range Status   Specimen Description BLOOD  LEFT FOREARM  Final   Special Requests   Final    BOTTLES DRAWN AEROBIC AND ANAEROBIC Blood Culture results may not be optimal due to an excessive volume of blood received in culture bottles   Culture   Final    NO GROWTH 2 DAYS Performed at [facility], [address], [place], [place] [address]    Report Status PENDING  Incomplete  Culture, blood (Routine x 2)     Status: None (Preliminary result)   Collection Time: 01/11/19  7:41 PM   Specimen: BLOOD  Result Value Ref Range Status   Specimen Description BLOOD LEFT FOREARM  Final   Special Requests   Final    BOTTLES DRAWN AEROBIC AND ANAEROBIC Blood Culture adequate volume   Culture   Final    NO GROWTH 2 DAYS Performed at [facility], [address] [place], [place] [address]    Report Status PENDING  Incomplete  SARS CORONAVIRUS 2 (TAT 6-24 HRS) Nasopharyngeal Nasopharyngeal Swab     Status: None   Collection Time: 01/11/19  9:44 PM   Specimen: Nasopharyngeal Swab  Result Value Ref Range Status   SARS Coronavirus 2 NEGATIVE NEGATIVE Final    Comment: (NOTE) SARS-CoV-2 target nucleic acids are NOT DETECTED. The SARS-CoV-2 RNA is generally detectable in upper and lower respiratory specimens during the acute phase of infection. Negative results do not preclude SARS-CoV-2 infection, do not rule out co-infections with other pathogens, and should not be used as the sole basis for treatment or other patient management decisions. Negative results must be combined with clinical observations, patient history, and epidemiological information. The expected result is Negative. Fact Sheet for Patients: SugarRoll.be Fact Sheet for Healthcare Providers: https://www.woods-mathews.com/ This test is not yet approved or cleared by the [place] FDA and  has been authorized for detection and/or diagnosis of SARS-CoV-2 by FDA under an Emergency Use Authorization (EUA). This  EUA will remain  in effect (meaning this test can be used) for the duration of the COVID-19 declaration under Section 56 4(b)(1) of the Act, 21 U.S.C. section 360bbb-3(b)(1), unless the authorization is terminated or revoked sooner. Performed at [facility], [place] [address], [place], [place] [address]       Radiology Studies: CT Head Wo Contrast  Result Date: 01/11/2019 CLINICAL  DATA:  Weakness, fall EXAM: CT HEAD WITHOUT CONTRAST TECHNIQUE: Contiguous axial images were obtained from the base of the skull through the vertex without intravenous contrast. COMPARISON:  None. FINDINGS: Brain: No evidence of acute infarction, hemorrhage, hydrocephalus, extra-axial collection or mass lesion/mass effect. Minimal scattered low-density changes within the periventricular and subcortical white matter suggesting sequela of chronic microvascular ischemic disease. Vascular: No hyperdense vessel or unexpected calcification. Skull: Normal. Negative for fracture or focal lesion. Sinuses/Orbits: Mucosal thickening within the left sphenoid sinus. Orbital structures appear unremarkable. Other: None. IMPRESSION: 1. No acute intracranial findings. 2. Left sphenoid sinus disease. Electronically Signed   By: Davina Poke M.D.   On: 01/11/2019 23:01   CT Chest Wo Contrast  Result Date: 01/11/2019 CLINICAL DATA:  Weakness and fall with history of abdominal aortic aneurysm EXAM: CT CHEST WITHOUT CONTRAST TECHNIQUE: Multidetector CT imaging of the chest was performed following the standard protocol without IV contrast. COMPARISON:  01/01/2019 FINDINGS: Cardiovascular: Somewhat limited due to lack of IV contrast. Atherosclerotic calcifications of the thoracic aorta are noted. 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 Coronary calcifications are seen. No cardiac enlargement is noted. Mediastinum/Nodes: Thoracic inlet demonstrates a large multinodular goiter with extension into the superior  mediastinum from the left lobe stable from the prior exam. Chronic hiatal hernia is seen. No hilar or mediastinal adenopathy is noted. Lungs/Pleura: The lungs are well aerated bilaterally. No focal infiltrate or sizable effusion is seen. No parenchymal nodules are noted. Chronic scarring in the lingula is seen. Upper Abdomen: Recently reported on CT of the abdomen and pelvis. Musculoskeletal: Degenerative changes of the thoracic spine are seen. Old rib fractures are noted anteriorly stable from the prior exam. Stable thoracic kyphosis is noted. No acute compression deformity is seen. 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 Recommend annual imaging followup by CTA or MRA. This recommendation follows 2010 ACCF/AHA/AATS/ACR/ASA/SCA/SCAI/SIR/STS/SVM Guidelines for the Diagnosis and Management of Patients with Thoracic Aortic Disease. Circulation. 2010; [id number]. Aortic aneurysm NOS (ICD10-I71.9) Large multinodular goiter stable from the prior exam. Hiatal hernia stable from the previous exam. Aortic Atherosclerosis (ICD10-I70.0). Electronically Signed   By: Inez Catalina M.D.   On: 01/11/2019 22:54   CT ABDOMEN PELVIS W CONTRAST  Result Date: 01/11/2019 CLINICAL DATA:  Acute abdominal pain. Intermittent right upper quadrant abdominal pain. EXAM: CT ABDOMEN AND PELVIS WITH CONTRAST TECHNIQUE: Multidetector CT imaging of the abdomen and pelvis was performed using the standard protocol following bolus administration of intravenous contrast. CONTRAST:  143mL OMNIPAQUE IOHEXOL 300 MG/ML  SOLN COMPARISON:  None. FINDINGS: Lower chest: The lung bases are clear. The heart size is normal. Hepatobiliary: The liver is normal. Cholelithiasis without acute inflammation.There is no biliary ductal dilation. Pancreas: Normal contours without ductal dilatation. No peripancreatic fluid collection. Spleen: No splenic laceration or hematoma. Adrenals/Urinary Tract: --Adrenal glands: No adrenal hemorrhage.  --Right kidney/ureter: No hydronephrosis or perinephric hematoma. --Left kidney/ureter: No hydronephrosis or perinephric hematoma. --Urinary bladder: Unremarkable. Stomach/Bowel: --Stomach/Duodenum: There is a large hiatal hernia. --Small bowel: No dilatation or inflammation. --Colon: There is a ventral wall hernia containing a loop of the transverse colon without evidence for an obstruction. There are postsurgical changes of the right hemicolon without evidence for obstruction. --Appendix: Likely surgically absent. Vascular/Lymphatic: Atherosclerotic calcification is present within the non-aneurysmal abdominal aorta, without hemodynamically significant stenosis. --No retroperitoneal lymphadenopathy. --there are some enlarged paraesophageal and gastrohepatic ligament lymph nodes --No pelvic or inguinal lymphadenopathy. Reproductive: There is a 1.6 cm left  ovarian cystic structure. The patient is status post prior hysterectomy. Other: No ascites or free air. The abdominal wall is normal. Musculoskeletal. There is age-indeterminate height loss of L3 vertebral body, presumably chronic. IMPRESSION: 1. No acute abdominopelvic process. 2. Ventral wall hernia containing a loop of transverse colon without evidence for obstruction. 3. Large hiatal hernia. 4. Cholelithiasis without acute inflammation. 5. A 1.6 cm left ovarian cystic structure. Further evaluation with a nonemergent outpatient pelvic ultrasound is recommended in 6 months. 6. Age-indeterminate L3 vertebral body height loss, presumably chronic. 7. Mildly enlarged paraesophageal lymph nodes, presumably reactive. Aortic Atherosclerosis (ICD10-I70.0). Electronically Signed   By: Constance Holster M.D.   On: 01/11/2019 22:54   DG Chest Port 1 View  Result Date: 01/11/2019 CLINICAL DATA:  Syncopal episode. Sepsis. EXAM: PORTABLE CHEST 1 VIEW COMPARISON:  January 01, 2019 FINDINGS: The heart size is stable from prior study. Aortic calcifications are noted. The  thoracic aorta is tortuous. Hiatal hernia is again noted. There may be trace bilateral pleural effusions. There is an old fracture of the proximal left humerus. There are multiple old healed left-sided rib fractures. There is no pneumothorax. No large pleural effusion. IMPRESSION: 1. Possible trace bilateral pleural effusions. 2. Hiatal hernia. 3. No acute cardiopulmonary process. 4. Stable old left-sided rib fractures. Old left proximal humerus fracture. Electronically Signed   By: Constance Holster M.D.   On: 01/11/2019 19:54   US Abdomen Limited RUQ  Result Date: 01/11/2019 CLINICAL DATA:  Right upper quadrant pain and jaundice EXAM: ULTRASOUND ABDOMEN LIMITED RIGHT UPPER QUADRANT COMPARISON:  None. FINDINGS: Gallbladder: Gallbladder is well distended with evidence of gallstone within. No wall thickening or pericholecystic fluid is noted. Phrygian cap is seen with a large stone. Common bile duct: Diameter: 5 mm Liver: No focal lesion identified. Within normal limits in parenchymal echogenicity. Portal vein is patent on color Doppler imaging with normal direction of blood flow towards the liver. Other: None. IMPRESSION: Cholelithiasis without complicating factors. A Phrygian cap is seen with a stone noted within. Electronically Signed   By: Inez Catalina M.D.   On: 01/11/2019 20:39     Scheduled Meds: . mometasone-formoterol  2 puff Inhalation BID  . [START ON 01/16/2019] pantoprazole  40 mg Intravenous Q12H  . sertraline  50 mg Oral Daily  . umeclidinium bromide  1 puff Inhalation Daily   Continuous Infusions: . 0.9 % NaCl with KCl 20 mEq / L 100 mL/hr at 01/13/19 [id number]  . pantoprazole (PROTONIX) IVPB    . pantoprozole (PROTONIX) infusion 8 mg/hr (01/13/19 0847)     LOS: 0 days   Time Spent in minutes   45 minutes  Brave Dack D.O. on 01/13/2019 at 1:16 PM  Between 7am to 7pm - Please see pager noted on [email or web address]  After 7pm go to [email or web address]  And look for the night coverage  person covering for me after hours  [facility] Office  [phone]

## 2019-01-14 ENCOUNTER — Inpatient Hospital Stay (HOSPITAL_COMMUNITY): Payer: Medicare HMO | Admitting: Anesthesiology

## 2019-01-14 ENCOUNTER — Encounter (HOSPITAL_COMMUNITY): Payer: Self-pay | Admitting: Family Medicine

## 2019-01-14 ENCOUNTER — Encounter (HOSPITAL_COMMUNITY)
Admission: EM | Disposition: A | Discharge status: Home / Self Care | Payer: Self-pay | Source: Other Acute Inpatient Hospital | Attending: Internal Medicine

## 2019-01-14 DIAGNOSIS — Z85038 Personal history of other malignant neoplasm of large intestine: Secondary | ICD-10-CM

## 2019-01-14 HISTORY — PX: ESOPHAGOGASTRODUODENOSCOPY (EGD) WITH PROPOFOL: SHX5813

## 2019-01-14 HISTORY — PX: HEMOSTASIS CLIP PLACEMENT: SHX6857

## 2019-01-14 LAB — BASIC METABOLIC PANEL
Anion gap: 6 (ref 5–15)
BUN: 5 mg/dL — ABNORMAL LOW (ref 8–23)
CO2: 21 mmol/L — ABNORMAL LOW (ref 22–32)
Calcium: 6.7 mg/dL — ABNORMAL LOW (ref 8.9–10.3)
Chloride: 110 mmol/L (ref 98–111)
Creatinine, Ser: 0.32 mg/dL — ABNORMAL LOW (ref 0.44–1.00)
GFR calc Af Amer: >60 mL/min (ref >60)
GFR calc non Af Amer: >60 mL/min (ref >60)
Glucose, Bld: 75 mg/dL (ref 70–99)
Potassium: 3.2 mmol/L — ABNORMAL LOW (ref 3.5–5.1)
Sodium: 137 mmol/L (ref 135–145)

## 2019-01-14 LAB — CBC
HCT: 21.9 % — ABNORMAL LOW (ref 36.0–46.0)
Hemoglobin: 7.3 g/dL — ABNORMAL LOW (ref 12.0–15.0)
MCH: 30.5 pg (ref 26.0–34.0)
MCHC: 33.3 g/dL (ref 30.0–36.0)
MCV: 91.6 fL (ref 80.0–100.0)
Platelets: 216 10*3/uL (ref 150–400)
RBC: 2.39 MIL/uL — ABNORMAL LOW (ref 3.87–5.11)
RDW: 16.3 % — ABNORMAL HIGH (ref 11.5–15.5)
WBC: 10.9 10*3/uL — ABNORMAL HIGH (ref 4.0–10.5)
nRBC: 0 % (ref 0.0–0.2)

## 2019-01-14 LAB — HEMOGLOBIN AND HEMATOCRIT, BLOOD
HCT: 22 % — ABNORMAL LOW (ref 36.0–46.0)
HCT: 27.5 % — ABNORMAL LOW (ref 36.0–46.0)
HCT: 27.2 % — ABNORMAL LOW (ref 36.0–46.0)
HCT: 23.1 % — ABNORMAL LOW (ref 36.0–46.0)
Hemoglobin: 7.4 g/dL — ABNORMAL LOW (ref 12.0–15.0)
Hemoglobin: 9 g/dL — ABNORMAL LOW (ref 12.0–15.0)
Hemoglobin: 8.9 g/dL — ABNORMAL LOW (ref 12.0–15.0)
Hemoglobin: 7.9 g/dL — ABNORMAL LOW (ref 12.0–15.0)

## 2019-01-14 SURGERY — ESOPHAGOGASTRODUODENOSCOPY (EGD) WITH PROPOFOL
Anesthesia: Monitor Anesthesia Care

## 2019-01-14 MED ORDER — DOCUSATE SODIUM 100 MG PO CAPS
100.0000 mg | ORAL_CAPSULE | Freq: Every day | ORAL | Status: DC
  Administered 2019-01-15 – 2019-01-18 (×4): 100 mg via ORAL
  Filled 2019-01-14 (×4): qty 1

## 2019-01-14 MED ORDER — PROPOFOL 10 MG/ML IV BOLUS
INTRAVENOUS | Status: DC | PRN
  Administered 2019-01-14 (×2): 25 mg via INTRAVENOUS

## 2019-01-14 MED ORDER — ALBUTEROL SULFATE HFA 108 (90 BASE) MCG/ACT IN AERS: 1.0000 | INHALATION_SPRAY | RESPIRATORY_TRACT | Status: DC | PRN

## 2019-01-14 MED ORDER — MONTELUKAST SODIUM 10 MG PO TABS
10.0000 mg | ORAL_TABLET | Freq: Every day | ORAL | Status: DC
  Administered 2019-01-14 – 2019-01-17 (×4): 10 mg via ORAL
  Filled 2019-01-14 (×4): qty 1

## 2019-01-14 MED ORDER — SODIUM CHLORIDE 0.9 % IV SOLN
INTRAVENOUS | Status: AC | PRN
  Administered 2019-01-14: 1000 mL via INTRAVENOUS

## 2019-01-14 MED ORDER — CYANOCOBALAMIN 1000 MCG/ML IJ SOLN
1000.0000 ug | Freq: Once | INTRAMUSCULAR | Status: AC
  Administered 2019-01-14: 1000 ug via INTRAMUSCULAR
  Filled 2019-01-14 (×2): qty 1

## 2019-01-14 MED ORDER — ADULT MULTIVITAMIN W/MINERALS CH
1.0000 | ORAL_TABLET | Freq: Every day | ORAL | Status: DC
  Administered 2019-01-15 – 2019-01-18 (×4): 1 via ORAL
  Filled 2019-01-14 (×4): qty 1

## 2019-01-14 MED ORDER — ACETAMINOPHEN 500 MG PO TABS
1000.0000 mg | ORAL_TABLET | Freq: Four times a day (QID) | ORAL | Status: DC | PRN
  Administered 2019-01-14 – 2019-01-17 (×4): 1000 mg via ORAL
  Filled 2019-01-14 (×4): qty 2

## 2019-01-14 MED ORDER — PANTOPRAZOLE SODIUM 40 MG IV SOLR: 40.0000 mg | Freq: Two times a day (BID) | INTRAVENOUS | Status: DC

## 2019-01-14 MED ORDER — ALBUTEROL SULFATE (2.5 MG/3ML) 0.083% IN NEBU: 2.5000 mg | INHALATION_SOLUTION | RESPIRATORY_TRACT | Status: DC | PRN

## 2019-01-14 MED ORDER — PROPOFOL 500 MG/50ML IV EMUL
INTRAVENOUS | Status: DC | PRN
  Administered 2019-01-14: 100 ug/kg/min via INTRAVENOUS

## 2019-01-14 MED ORDER — ALPRAZOLAM 0.25 MG PO TABS: 0.2500 mg | ORAL_TABLET | Freq: Two times a day (BID) | ORAL | Status: DC | PRN

## 2019-01-14 MED ORDER — SUCRALFATE 1 GM/10ML PO SUSP
1.0000 g | Freq: Three times a day (TID) | ORAL | Status: DC
  Administered 2019-01-14 – 2019-01-18 (×17): 1 g via ORAL
  Filled 2019-01-14 (×17): qty 10

## 2019-01-14 MED ORDER — SIMVASTATIN 20 MG PO TABS
20.0000 mg | ORAL_TABLET | Freq: Every day | ORAL | Status: DC
  Administered 2019-01-14 – 2019-01-17 (×4): 20 mg via ORAL
  Filled 2019-01-14 (×4): qty 1

## 2019-01-14 MED ORDER — SODIUM CHLORIDE 0.9 % IV SOLN: INTRAVENOUS | Status: DC

## 2019-01-14 MED ORDER — BOOST / RESOURCE BREEZE PO LIQD CUSTOM
1.0000 | Freq: Three times a day (TID) | ORAL | Status: DC
  Administered 2019-01-14 – 2019-01-17 (×8): 1 via ORAL

## 2019-01-14 MED ORDER — LISINOPRIL 10 MG PO TABS
20.0000 mg | ORAL_TABLET | Freq: Every day | ORAL | Status: DC
  Administered 2019-01-14 – 2019-01-18 (×5): 20 mg via ORAL
  Filled 2019-01-14 (×5): qty 2

## 2019-01-14 SURGICAL SUPPLY — 15 items

## 2019-01-14 NOTE — Transfer of Care (Signed)
Immediate Anesthesia Transfer of Care Note  Patient: Lynnetta S Kunert  Procedure(s) Performed: ESOPHAGOGASTRODUODENOSCOPY (EGD) WITH PROPOFOL (N/A ) HEMOSTASIS CLIP PLACEMENT  Patient Location: Endoscopy Unit  Anesthesia Type:MAC  Level of Consciousness: awake, alert  and oriented  Airway & Oxygen Therapy: Patient Spontanous Breathing and Patient connected to nasal cannula oxygen  Post-op Assessment: Report given to RN and Post -op Vital signs reviewed and stable  Post vital signs: Reviewed and stable  Last Vitals:  Vitals Value Taken Time  BP 130/55 01/14/19 0948  Temp    Pulse 79 01/14/19 0948  Resp 21 01/14/19 0948  SpO2 100 % 01/14/19 0948  Vitals shown include unvalidated device data.  Last Pain:  Vitals:   01/14/19 0948  TempSrc:   PainSc: 0-No pain      Patients Stated Pain Goal: 0 (25/42/70 6237)  Complications: No apparent anesthesia complications

## 2019-01-14 NOTE — Anesthesia Preprocedure Evaluation (Addendum)
Anesthesia Evaluation  Patient identified by MRN, date of birth, ID band Patient awake    Reviewed: Allergy & Precautions, NPO status , Patient's Chart, lab work & pertinent test results  History of Anesthesia Complications Negative for: history of anesthetic complications  Airway Mallampati: II  TM Distance: >3 FB Neck ROM: Full    Dental  (+) Edentulous Upper, Poor Dentition, Chipped, Missing, Dental Advisory Given   Pulmonary COPD,  COPD inhaler, Current Smoker ("quit 2 weeks ago") and Patient abstained from smoking.,    Pulmonary exam normal        Cardiovascular hypertension, Pt. on medications (-) anginaNormal cardiovascular exam   Hx thoracic AAA    Neuro/Psych negative neurological ROS  negative psych ROS   GI/Hepatic Neg liver ROS, GERD  Controlled, Colon cancer    Endo/Other   Hypocalcemia Hypokalemia   Renal/GU negative Renal ROS     Musculoskeletal negative musculoskeletal ROS (+)   Abdominal   Peds  Hematology  (+) anemia ,  Polycythemia vera    Anesthesia Other Findings Covid neg 12/11 Carcinoid tumor s/p resection   Reproductive/Obstetrics                           Anesthesia Physical Anesthesia Plan  ASA: III  Anesthesia Plan: MAC   Post-op Pain Management:    Induction: Intravenous  PONV Risk Score and Plan: 2 and Propofol infusion and Treatment may vary due to age or medical condition  Airway Management Planned: Nasal Cannula and Natural Airway  Additional Equipment: None  Intra-op Plan:   Post-operative Plan:   Informed Consent: I have reviewed the patients History and Physical, chart, labs and discussed the procedure including the risks, benefits and alternatives for the proposed anesthesia with the patient or authorized representative who has indicated his/her understanding and acceptance.   Patient has DNR.  Discussed DNR with patient and  Suspend DNR.     Plan Discussed with: CRNA and Anesthesiologist  Anesthesia Plan Comments: ([place] with vasopressors, IV fluids, intubation, and "brief" chest compressions if arrest deemed reversible. Does not want "prolonged" resuscitation in the event of arrest)      Anesthesia Quick Evaluation

## 2019-01-14 NOTE — Progress Notes (Signed)
Initial Nutrition Assessment  DOCUMENTATION CODES:   Not applicable  INTERVENTION:    Boost Breeze po TID, each supplement provides 250 kcal and 9 grams of protein  MVI daily   NUTRITION DIAGNOSIS:   Increased nutrient needs related to acute illness as evidenced by estimated needs.  GOAL:   Patient will meet greater than or equal to 90% of their needs  MONITOR:   PO intake, Supplement acceptance, Diet advancement, Weight trends, Labs, I & O's  REASON FOR ASSESSMENT:   Malnutrition Screening Tool    ASSESSMENT:   Patient with PMH significant for colon cancer s/p resection and HTN. Presented to [facility] with GI bleed. Transferred to [facility] for embolization.   12/14- EGD found severe esophagitis with ulceration   Pt denies having loss of appetite PTA. Typically eats 6 smaller meals a day that consist of fruit, vegetables, and different types of meats. States she cannot eat large meals as it makes her nauseous. Does not use supplementation at home. Tolerating clears this am. RD to provide supplementation to maximize protein on clear liquid diet.   Pt endorses a UBW of 175 lb and an intentional wt loss of 20 lb within the last six months. Reports she has been trying to lose weight for the last 3 years (started at 300 lb) with lifestyle changes. Records indicate pt weighed 182 lb on 10/14/17 and 156 lb this admission.   I/O: +1,068 ml since admit UOP: 1,400 ml x 24 hrs    Drips: NS with 20 mEq KCL @ 100 ml/hr  Medications: Vit B12, colace, MVI with minerals  Labs: K 3.2 (L)   NUTRITION - FOCUSED PHYSICAL EXAM:    Most Recent Value  Orbital Region  No depletion  Upper Arm Region  No depletion  Thoracic and Lumbar Region  Unable to assess  Buccal Region  No depletion  Temple Region  Mild depletion  Clavicle Bone Region  No depletion  Clavicle and Acromion Bone Region  No depletion  Dorsal Hand  No depletion  Patellar Region  No depletion  Anterior Thigh Region  No  depletion  Posterior Calf Region  No depletion  Edema (RD Assessment)  Mild  Hair  Reviewed  Eyes  Reviewed  Mouth  Reviewed  Skin  Reviewed  Nails  Reviewed     Diet Order:   Diet Order            Diet clear liquid Room service appropriate? Yes; Fluid consistency: Thin  Diet effective now              EDUCATION NEEDS:   Education needs have been addressed  Skin:  Skin Assessment: Reviewed RN Assessment  Last BM:  12/13  Height:   Ht Readings from Last 1 Encounters:  01/14/19 5\' 4"  (1.626 m)    Weight:   Wt Readings from Last 1 Encounters:  01/14/19 70.8 kg    Ideal Body Weight:  54.5 kg  BMI:  Body mass index is 26.78 kg/m.  Estimated Nutritional Needs:   Kcal:  1750-1950 kcal  Protein:  85-100 grams  Fluid:  >/= 1.7 L/day   Mariana Single RD, LDN Clinical Nutrition Pager # - [phone]

## 2019-01-14 NOTE — Interval H&P Note (Signed)
History and Physical Interval Note: 68 year old female with coffee-ground emesis and melena, noted to have bleeding from distal esophagus and gastric fundus, clear source could not be identified during EGD on 01/12/2019, treated with hemospray, repeat EGD today.  01/14/2019 9:15 AM  Alicia Holder  has presented today for EGD, with the diagnosis of GI bleed.  The various methods of treatment have been discussed with the patient and family. After consideration of risks, benefits and other options for treatment, the patient has consented to  Procedure(s): ESOPHAGOGASTRODUODENOSCOPY (EGD) WITH PROPOFOL (N/A) as a surgical intervention.  The patient's history has been reviewed, patient examined, no change in status, stable for surgery.  I have reviewed the patient's chart and labs.  Questions were answered to the patient's satisfaction.     [name]

## 2019-01-14 NOTE — Brief Op Note (Signed)
01/13/2019 - 01/14/2019  9:47 AM  PATIENT:  Alicia Holder  68 y.o. female  PRE-OPERATIVE DIAGNOSIS:  GI bleed  POST-OPERATIVE DIAGNOSIS:  clip @ distal esophagus @ 63, severe esophagitis, esophageal ulcer, hiatal hernia   PROCEDURE:  Procedure(s): ESOPHAGOGASTRODUODENOSCOPY (EGD) WITH PROPOFOL (N/A) HEMOSTASIS CLIP PLACEMENT  SURGEON:  Surgeon(s) and Role:    [name], MD - Primary  PHYSICIAN ASSISTANT:   ASSISTANTS:Julie Gibson,RN,Guillaume Awaka,Tech   ANESTHESIA:   MAC  EBL: Minimal  BLOOD ADMINISTERED:none  DRAINS: none   LOCAL MEDICATIONS USED:  NONE  SPECIMEN:  No Specimen  DISPOSITION OF SPECIMEN:  N/A  COUNTS:  YES  TOURNIQUET:  * No tourniquets in log *  DICTATION: .Dragon Dictation  PLAN OF CARE: Admit to inpatient   PATIENT DISPOSITION:  PACU - hemodynamically stable.   Delay start of Pharmacological VTE agent (>24hrs) due to surgical blood loss or risk of bleeding: yes

## 2019-01-14 NOTE — Op Note (Signed)
[facility] Patient Name: Alicia Holder Procedure Date : 01/14/2019 MRN: [id number] Attending MD: [name] , MD Date of Birth: October 24, 1950 CSN: [id number] Age: 68 Admit Type: Inpatient Procedure:                Upper GI endoscopy Indications:              Coffee-ground emesis, Melena, EGD from outside                            facility showed active bleeding from distal                            esophagus and gastric fundus, treated with                            hemospray, exact site could not be identified Providers:                [name], MD, Glori Bickers, RN, William Dalton,                            Technician Referring MD:             [facility] Medicines:                Monitored Anesthesia Care Complications:            No immediate complications. Estimated blood loss:                            Minimal. Estimated Blood Loss:     Estimated blood loss was minimal. Procedure:                Pre-Anesthesia Assessment:                           - Prior to the procedure, a History and Physical                            was performed, and patient medications and                            allergies were reviewed. The patient's tolerance of                            previous anesthesia was also reviewed. The risks                            and benefits of the procedure and the sedation                            options and risks were discussed with the patient.                            All questions were answered, and informed consent  was obtained. Prior Anticoagulants: The patient has                            taken no previous anticoagulant or antiplatelet                            agents. ASA Grade Assessment: III - A patient with                            severe systemic disease. After reviewing the risks                            and benefits, the patient was deemed in                            satisfactory  condition to undergo the procedure.                           After obtaining informed consent, the endoscope was                            passed under direct vision. Throughout the                            procedure, the patient's blood pressure, pulse, and                            oxygen saturations were monitored continuously. The                            [id number]) Olympus gastroscope was                            introduced through the mouth, and advanced to the                            second part of duodenum. The upper GI endoscopy was                            accomplished without difficulty. The patient                            tolerated the procedure well. Scope In: Scope Out: Findings:      LA Grade D (one or more mucosal breaks involving at least 75% of       esophageal circumference) esophagitis with bleeding was found 25 to 35       cm from the incisors.      Many superficial esophageal ulcers with stigmata of recent bleeding, red       spot( at 34 cm from insertion), were found 25 to 35 cm from the       incisors. For hemostasis, one hemostatic clip was successfully placed       (MR conditional) at the site of red spot(34 cm from insertion, above the  GE junction), likely site of prior active bleeding. There was no       bleeding at the end of the procedure.      A 5 cm hiatal hernia was present.      The gastric mucosa in the cardia and fundus had thickened folds and were       friable with easy oozing noted.      Diffuse moderately erythematous mucosa without bleeding was found in the       cardia, in the gastric fundus, in the gastric body, at the incisura, in       the gastric antrum, in the prepyloric region of the stomach and at the       pylorus.      The examined duodenum was normal. Impression:               - LA Grade D esophagitis with bleeding.                           - Esophageal ulcers with stigmata of recent                             bleeding. Clip (MR conditional) was placed.                           - 5 cm hiatal hernia.                           - Erythematous mucosa in the cardia, gastric                            fundus, gastric body, incisura, antrum, prepyloric                            region of the stomach and pylorus.                           - Normal examined duodenum.                           - No specimens collected. Moderate Sedation:      Patient did not receive moderate sedation for this procedure, but       instead received monitored anesthesia care. Recommendation:           - Clear liquid diet.                           - Give Protonix (pantoprazole): 8 mg/hr IV by                            continuous infusion for 1 day. Thereafter, PPI BID                            for at least 2 months.                           - Use sucralfate suspension 1 gram PO QID for 2  weeks.                           - H pylori serology, treat if found.                           - Avoid ASA and NSAIDs.                           - Repeat upper endoscopy in 2 months to check                            healing. Procedure Code(s):        --- Professional ---                           [phone], Esophagogastroduodenoscopy, flexible,                            transoral; with control of bleeding, any method Diagnosis Code(s):        --- Professional ---                           K20.91, Esophagitis, unspecified with bleeding                           K22.11, Ulcer of esophagus with bleeding                           K44.9, Diaphragmatic hernia without obstruction or                            gangrene                           K31.89, Other diseases of stomach and duodenum                           K92.0, Hematemesis                           K92.1, Melena (includes Hematochezia) CPT copyright 2019 American Medical Association. All rights reserved. The codes documented in this report are  preliminary and upon coder review may  be revised to meet current compliance requirements. [name], MD 01/14/2019 9:55:57 AM This report has been signed electronically. Number of Addenda: 0

## 2019-01-14 NOTE — Anesthesia Postprocedure Evaluation (Signed)
Anesthesia Post Note  Patient: Alicia Holder  Procedure(s) Performed: ESOPHAGOGASTRODUODENOSCOPY (EGD) WITH PROPOFOL (N/A ) HEMOSTASIS CLIP PLACEMENT     Patient location during evaluation: PACU Anesthesia Type: MAC Level of consciousness: awake and alert Pain management: pain level controlled Vital Signs Assessment: post-procedure vital signs reviewed and stable Respiratory status: spontaneous breathing, nonlabored ventilation and respiratory function stable Cardiovascular status: stable and blood pressure returned to baseline Anesthetic complications: no    Last Vitals:  Vitals:   01/14/19 1000 01/14/19 1010  BP:  129/65  Pulse: 69 69  Resp: (!) 28 (!) 23  Temp:    SpO2: 100% 100%    Last Pain:  Vitals:   01/14/19 0948  TempSrc: Oral  PainSc: 0-No pain                 Audry Pili

## 2019-01-14 NOTE — Op Note (Signed)
EGD showed  Severe esophagitis and esophageal ulceration from 25 to 35 cm from insertion. There was an area of red spot, possible site of prior bleeding, needed with an Endo Clip placement. The junction was noted at 35 cm from insertion. 5 cm hiatal hernia was noted. Mucosa of the fundus and cardia appeared erythematous and had thickened folds. Diffuse erythema was noted in the gastric body, incisura, antrum and pylorus. Duodenal bulb and duodenum appeared unremarkable.   Recommendations: Continue PPI at 8 mg/hr IV for another 24 hours, then switch to PPI twice daily, needs to be continued at least for another 8 weeks. Start Carafate suspension 1 g 4 times daily, be continued for total 2 weeks. H. pylori serology, treat if found. Avoid aspirin and NSAIDs. Clear liquid diet for today.   [name], MD

## 2019-01-14 NOTE — Progress Notes (Addendum)
PROGRESS NOTE    Alicia Holder  [id number] DOB: 1951/01/22 DOA: 01/13/2019 PCP: Ellamae Sia, MD   Brief Narrative:  HPI on 01/13/2019 by Dr. Quintella Baton This is a 68 year old female was admitted at [facility] hospital with GI bleed.  She has severe anemia with initial hemoglobin is 5.9.  She also had right upper quadrant pain.  Since admission she has been transfused a total of 4 units packed red blood cells.  At [facility] she continuedto have large bloody stools and drops in her blood pressure. She was resuscitated with IV fluids and PRBC. Patient was evaluated by GI who recommended urgent EGD given hemodynamic instability. She underwent a EGD yesterday which showed red blood cells in the distal esophagus and blood clot in the gastric fundus but no definite source of her bleeding.  As patient is bleeding significantly with no source being found, she was transferred to [facility].  The patient was discussed with IR for vascular intervention Dr. Earleen Newport aware of the transfer.  Patient's right upper quadrant pain has improved. Her  LFTs and bilirubin normal . Ultrasound right upper quadrant shows cholelithiasis without evidence of cholecystitis. CT abdomen pelvis also shows cholelithiasis also without cholecystitis.  Last week she got SOB and a chest cold. She was hospitalized and discharged last Friday.  Friday she threw up blood, a lot. She got to where she couldn't walk. Her stool was a bit bloody. She had abdominal pain greater in her epigastric region and and right side. She has a h/o a hernia and GERD. She has no h/o stomach ulcers or GI bleed. She seldom eats spicy food. She does not use NSAIDs for  pain except her daily low dose ASA. She had a precancerous polp removed in 2012. Her last colonoscopy was 4 years ago. She is due for a repeat colonoscopy at 5 years. Currently she had a bit of melanotic stool yesteray. She states she has not vomited blood in 24 hours.  History provided  by the patient who is weak but alert and oriented.  Interim history Patient admitted with GI bleed from [place] [facility].  IR as well as GI consulted and appreciated.  Continue to monitor H&H. S/p EGD today.  Assessment & Plan   Acute GI bleed/acute blood loss anemia -Hemoglobin is noted to be as low as 5.3 on 01/11/2019 -Did receive blood transfusions -Current hemoglobin dropped this morning to 7.3 -Continue IV Protonix -Morning of admission to [facility], it was noted the patient had 250 cc of bright red blood per rectum.  She has not had any further hematemesis since admission -Interventional radiology consulted and appreciated -Patient status post EGD on 12/12 at [facility] which showed large amount of red blood in the distal esophagus and gastric fundus with source not pinpointed.  Patient did have hemostatic spray in both areas with no blood noted afterward. -Gastroenterology consulted and appreciated -s/p EGD today showing severe esophagitis with esophageal ulceration from 25 to 35 cm from insertion.  Spot was read and possible site of prior bleeding.  Endo Clip placed.  Diffuse erythema noted in the gastric body, incisura, antrum and pylorus. -Discussed with Dr. Parke Simmers, Gastroenterology, recommending additional Protonix drip for 24 hours then switch to PPI this daily for 8 weeks -Continue to monitor CBC, if drops below 7 will transfuse additional unit -Of note anemia panel showed adequate iron and iron storage however vitamin B12 was 173, will supplement  Leukocytosis with lactic acidosis -Suspect reactive, WBC count improving and has  now WNL; lactic acidosis resolved -Recent blood cultures [facility] show no growth to date -UA and chest x-ray showed no infection  RUQ TTP -Improved however no clear etiology -No evidence of a sending cholangitis -LFTs are normal -CT and right upper quadrant ultrasound unremarkable -Continue pain control  Secondary polycythemia -Patient follows with hematology  oncology, Dr. 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  Will continue IV Protonix for additional 24 hours, suspect discharged home within 1 to 2 days  Consultants Gastroenterology Interventional Radiology  Procedures  EGD  Antibiotics   Anti-infectives (From admission, onward)   None      Subjective:   Alicia Holder seen and examined today.  Patient with no abdominal pain this morning, denied any nausea or vomiting or further blood per rectum.  Denies current chest pain or shortness of breath, dizziness or headache.  Objective:   Vitals:   01/14/19 0948 01/14/19 0957 01/14/19 1000 01/14/19 1010  BP: (!) 130/55 125/62  129/65  Pulse: 78 73 69 69  Resp: (!) 21 (!) 27 (!) 28 (!) 23  Temp: 97.9 F (36.6 C)     TempSrc: Oral     SpO2: 100% 100% 100% 100%  Weight:      Height:        Intake/Output Summary (Last 24 hours) at 01/14/2019 1028 Last data filed at 01/14/2019 0944 Gross per 24 hour  Intake 2717.72 ml  Output 1400 ml  Net 1317.72 ml   Filed Weights   01/14/19 0844  Weight: 70.8 kg    Exam  General: Well developed, well nourished, elderly, NAD, appears stated age  2: NCAT, mucous membranes moist.   Cardiovascular: S1 S2 auscultated, RRR,   Respiratory: Clear to auscultation bilaterally   Abdomen: Soft, nontender, nondistended, + bowel sounds  Extremities: warm dry without cyanosis clubbing or edema  Neuro: AAOx3, nonfocal  Psych: pleasant   Data Reviewed: I have personally reviewed following labs and imaging studies  CBC: Recent Labs  Lab 01/11/19 1923 01/11/19 2317 01/12/19 0428 01/12/19 2347 01/13/19 0245 01/13/19 0624 01/13/19 1029 01/13/19 1656 01/13/19 2322 01/14/19 0335  WBC 21.6* 18.3* 13.8* 13.2* 12.1*  --   --   --   --  10.9*  NEUTROABS 16.0* 14.5*  --  9.9*  --   --   --   --   --   --   HGB 5.9* 5.3* 6.1* 8.8* 8.3* 8.5* 8.4* 7.8* 7.4* 7.3*  HCT 19.0* 16.5* 18.5* 25.7* 23.4* 25.5* 24.5* 22.7* 22.0* 21.9*  MCV 91.8 88.2 88.1 88.0 84.5  --   --   --   --  91.6  PLT 376 304 271 201 192  --   --   --   --  [id number]   Basic Metabolic Panel: Recent Labs  Lab 01/11/19 1923 01/11/19 2317 01/12/19 0428 01/13/19 0245 01/14/19 0335  NA 133* 130* 132* 135 137  K 5.1 3.7 3.5 3.1* 3.2*  CL 100 102 104 111 110  CO2 23 23 23  19* 21*  GLUCOSE 137* 111* 94 88 75  BUN 28* 25* 20 12 5*  CREATININE 0.78 0.49 0.37* 0.31* 0.32*  CALCIUM 8.0* 7.0* 7.0* 6.2* 6.7*   GFR: Estimated Creatinine Clearance: 64 mL/min (A) (by C-G formula based on SCr of 0.32 mg/dL (L)). Liver Function Tests: Recent Labs  Lab 01/11/19 1923 01/11/19 2317 01/12/19 0428  AST 28 19 24   ALT 19 17 16   ALKPHOS 36* 33* 28*  BILITOT 0.2*  0.5 0.2* 0.4  PROT 4.9* 4.8* 4.3*  ALBUMIN 2.4* 2.3* 2.0*   Recent Labs  Lab 01/11/19 1923  LIPASE 34   No results for input(s): AMMONIA in the last 168 hours. Coagulation Profile: Recent Labs  Lab 01/11/19 1923 01/12/19 0428  INR 1.1 1.1   Cardiac Enzymes: No results for input(s): CKTOTAL, CKMB, CKMBINDEX, TROPONINI in  the last 168 hours. BNP (last 3 results) No results for input(s): PROBNP in the last 8760 hours. HbA1C: No results for input(s): HGBA1C in the last 72 hours. CBG: No results for input(s): GLUCAP in the last 168 hours. Lipid Profile: No results for input(s): CHOL, HDL, LDLCALC, TRIG, CHOLHDL, LDLDIRECT in the last 72 hours. Thyroid Function Tests: No results for input(s): TSH, T4TOTAL, FREET4, T3FREE, THYROIDAB in the last 72 hours. Anemia Panel: Recent Labs    01/11/19 2317  VITAMINB12 173*  FOLATE 10.3  FERRITIN 26  TIBC 279  IRON 32  RETICCTPCT 4.8*   Urine analysis:    Component Value Date/Time   COLORURINE YELLOW (A) 01/11/2019 2144   APPEARANCEUR CLEAR (A) 01/11/2019 2144   LABSPEC >1.046 (H) 01/11/2019 2144   PHURINE 6.0 01/11/2019 2144   GLUCOSEU NEGATIVE 01/11/2019 2144   HGBUR SMALL (A) 01/11/2019 2144   [place] NEGATIVE 01/11/2019 2144   KETONESUR 5 (A) 01/11/2019 2144   PROTEINUR NEGATIVE 01/11/2019 2144   NITRITE NEGATIVE 01/11/2019 2144   LEUKOCYTESUR NEGATIVE 01/11/2019 2144   Sepsis Labs: @LABRCNTIP (procalcitonin:4,lacticidven:4)  ) Recent Results (from the past 240 hour(s))  Culture, blood (Routine x 2)     Status: None (Preliminary result)   Collection Time: 01/11/19  7:23 PM   Specimen: BLOOD  Result Value Ref Range Status   Specimen Description BLOOD LEFT FOREARM  Final   Special Requests   Final    BOTTLES DRAWN AEROBIC AND ANAEROBIC Blood Culture results may not be optimal due to an excessive volume of blood received in culture bottles   Culture   Final    NO GROWTH 3 DAYS Performed at [facility], [address] [place], [place] [address]    Report Status PENDING  Incomplete  Culture, blood (Routine x 2)     Status: None (Preliminary result)   Collection Time: 01/11/19  7:41 PM   Specimen: BLOOD  Result Value Ref Range Status   Specimen Description BLOOD LEFT FOREARM  Final   Special Requests   Final    BOTTLES DRAWN  AEROBIC AND ANAEROBIC Blood Culture adequate volume   Culture   Final    NO GROWTH 3 DAYS Performed at [facility], [address] [place],  [address]    Report Status PENDING  Incomplete  SARS CORONAVIRUS 2 (TAT 6-24 HRS) Nasopharyngeal Nasopharyngeal Swab  Status: None   Collection Time: 01/11/19  9:44 PM   Specimen: Nasopharyngeal Swab  Result Value Ref Range Status   SARS Coronavirus 2 NEGATIVE NEGATIVE Final    Comment: (NOTE) SARS-CoV-2 target nucleic acids are NOT DETECTED. The SARS-CoV-2 RNA is generally detectable in upper and lower respiratory specimens during the acute phase of infection. Negative results do not preclude SARS-CoV-2 infection, do not rule out co-infections with other pathogens, and should not be used as the sole basis for treatment or other patient management decisions. Negative results must be combined with clinical observations, patient history, and epidemiological information. The expected result is Negative. Fact Sheet for Patients: SugarRoll.be Fact Sheet for Healthcare Providers: https://www.woods-mathews.com/ This test is not yet approved or cleared by the [place] FDA and  has been authorized for detection and/or diagnosis of SARS-CoV-2 by FDA under an Emergency Use Authorization (EUA). This EUA will remain  in effect (meaning this test can be used) for the duration of the COVID-19 declaration under Section 56 4(b)(1) of the Act, 21 U.S.C. section 360bbb-3(b)(1), unless the authorization is terminated or revoked sooner. Performed at [facility], [place] [address], [place], [place] [address]       Radiology Studies: No results found.   Scheduled Meds: . mometasone-formoterol  2 puff Inhalation BID  . [START ON 01/16/2019] pantoprazole  40 mg Intravenous Q12H  . sertraline  50 mg Oral Daily  . umeclidinium bromide  1 puff Inhalation Daily   Continuous Infusions: .  0.9 % NaCl with KCl 20 mEq / L 100 mL/hr at 01/14/19 0200  . pantoprazole (PROTONIX) IVPB    . pantoprozole (PROTONIX) infusion 8 mg/hr (01/14/19 0422)     LOS: 1 day   Time Spent in minutes   45 minutes  Anastasios Melander D.O. on 01/14/2019 at 10:28 AM  Between 7am to 7pm - Please see pager noted on [email or web address]  After 7pm go to [email or web address]  And look for the night coverage person covering for me after hours  [facility] Office  [phone]

## 2019-01-14 NOTE — Anesthesia Procedure Notes (Signed)
Procedure Name: MAC Date/Time: 01/14/2019 9:37 AM Performed by: Imagene Riches, CRNA Pre-anesthesia Checklist: Patient identified, Emergency Drugs available, Suction available, Patient being monitored and Timeout performed Patient Re-evaluated:Patient Re-evaluated prior to induction Oxygen Delivery Method: Nasal cannula

## 2019-01-15 LAB — HEMOGLOBIN AND HEMATOCRIT, BLOOD
HCT: 25.2 % — ABNORMAL LOW (ref 36.0–46.0)
HCT: 28.1 % — ABNORMAL LOW (ref 36.0–46.0)
HCT: 25.3 % — ABNORMAL LOW (ref 36.0–46.0)
HCT: 26.1 % — ABNORMAL LOW (ref 36.0–46.0)
Hemoglobin: 8.3 g/dL — ABNORMAL LOW (ref 12.0–15.0)
Hemoglobin: 9.2 g/dL — ABNORMAL LOW (ref 12.0–15.0)
Hemoglobin: 8.3 g/dL — ABNORMAL LOW (ref 12.0–15.0)
Hemoglobin: 8.5 g/dL — ABNORMAL LOW (ref 12.0–15.0)

## 2019-01-15 LAB — COMPREHENSIVE METABOLIC PANEL
ALT: 10 U/L (ref 0–44)
AST: 13 U/L — ABNORMAL LOW (ref 15–41)
Albumin: 1.7 g/dL — ABNORMAL LOW (ref 3.5–5.0)
Alkaline Phosphatase: 27 U/L — ABNORMAL LOW (ref 38–126)
Anion gap: 6 (ref 5–15)
BUN: <5 mg/dL — ABNORMAL LOW (ref 8–23)
CO2: 25 mmol/L (ref 22–32)
Calcium: 7.4 mg/dL — ABNORMAL LOW (ref 8.9–10.3)
Chloride: 107 mmol/L (ref 98–111)
Creatinine, Ser: 0.38 mg/dL — ABNORMAL LOW (ref 0.44–1.00)
GFR calc Af Amer: >60 mL/min (ref >60)
GFR calc non Af Amer: >60 mL/min (ref >60)
Glucose, Bld: 89 mg/dL (ref 70–99)
Potassium: 2.5 mmol/L — CL (ref 3.5–5.1)
Sodium: 138 mmol/L (ref 135–145)
Total Bilirubin: 0.1 mg/dL — ABNORMAL LOW (ref 0.3–1.2)
Total Protein: 3.7 g/dL — ABNORMAL LOW (ref 6.5–8.1)

## 2019-01-15 LAB — MAGNESIUM: Magnesium: 1.4 mg/dL — ABNORMAL LOW (ref 1.7–2.4)

## 2019-01-15 MED ORDER — PANTOPRAZOLE SODIUM 40 MG IV SOLR
40.0000 mg | Freq: Two times a day (BID) | INTRAVENOUS | Status: DC
  Administered 2019-01-15: 40 mg via INTRAVENOUS
  Filled 2019-01-15 (×2): qty 40

## 2019-01-15 MED ORDER — PANTOPRAZOLE SODIUM 40 MG IV SOLR: 40.0000 mg | Freq: Two times a day (BID) | INTRAVENOUS | Status: DC

## 2019-01-15 MED ORDER — POTASSIUM CHLORIDE CRYS ER 20 MEQ PO TBCR
40.0000 meq | EXTENDED_RELEASE_TABLET | Freq: Two times a day (BID) | ORAL | Status: DC
  Administered 2019-01-15 (×2): 40 meq via ORAL
  Filled 2019-01-15 (×2): qty 2

## 2019-01-15 MED ORDER — MAGNESIUM SULFATE 4 GM/100ML IV SOLN
4.0000 g | Freq: Once | INTRAVENOUS | Status: AC
  Administered 2019-01-15: 4 g via INTRAVENOUS
  Filled 2019-01-15: qty 100

## 2019-01-15 NOTE — Progress Notes (Signed)
Subjective: Able to tolerate full liquid diet. States she had a bowel movement today which was not black or bloody. Denies nausea, vomiting or abdominal pain.  Objective: Vital signs in last 24 hours: Temp:  [97.8 F (36.6 C)-98.4 F (36.9 C)] 98.4 F (36.9 C) (12/15 0754) Pulse Rate:  [67-82] 82 (12/15 0754) Resp:  [18-20] 20 (12/15 1142) BP: (103-110)/(62-72) 110/72 (12/15 0801) SpO2:  [95 %-100 %] 95 % (12/15 0902) Weight change:  Last BM Date: 01/13/19  PE: Mild pallor GENERAL: No icterus, not in distress ABDOMEN: Soft, nondistended, nontender, normoactive bowel sounds EXTREMITIES: No deformity  Lab Results: Results for orders placed or performed during the hospital encounter of 01/13/19 (from the past 48 hour(s))  Hemoglobin and hematocrit, blood     Status: Abnormal   Collection Time: 01/13/19  4:56 PM  Result Value Ref Range   Hemoglobin 7.8 (L) 12.0 - 15.0 g/dL   HCT 22.7 (L) 36.0 - 46.0 %    Comment: Performed at [facility], [address] [place], [place] [address]  Hemoglobin and hematocrit, blood     Status: Abnormal   Collection Time: 01/13/19 11:22 PM  Result Value Ref Range   Hemoglobin 7.4 (L) 12.0 - 15.0 g/dL   HCT 22.0 (L) 36.0 - 46.0 %    Comment: Performed at [facility], [place] [address], [place], [place] [id number]  Basic metabolic panel     Status: Abnormal   Collection Time: 01/14/19  3:35 AM  Result Value Ref Range   Sodium 137 135 - 145 mmol/L   Potassium 3.2 (L) 3.5 - 5.1 mmol/L   Chloride 110 98 - 111 mmol/L   CO2 21 (L) 22 - 32 mmol/L   Glucose, Bld 75 70 - 99 mg/dL   BUN 5 (L) 8 - 23 mg/dL   Creatinine, Ser 0.32 (L) 0.44 - 1.00 mg/dL   Calcium 6.7 (L) 8.9 - 10.3 mg/dL   GFR calc non Af Amer >60 >60 mL/min   GFR calc Af Amer >60 >60 mL/min   Anion gap 6 5 - 15    Comment: Performed at [place] [address], [place], [place] [address]  [facility]     Status: Abnormal   Collection Time: 01/14/19  3:35 AM  Result  Value Ref Range   WBC 10.9 (H) 4.0 - 10.5 K/uL   RBC 2.39 (L) 3.87 - 5.11 MIL/uL   Hemoglobin 7.3 (L) 12.0 - 15.0 g/dL   HCT 21.9 (L) 36.0 - 46.0 %   MCV 91.6 80.0 - 100.0 fL   MCH 30.5 26.0 - 34.0 pg   MCHC 33.3 30.0 - 36.0 g/dL   RDW 16.3 (H) 11.5 - 15.5 %   Platelets 216 150 - 400 K/uL   nRBC 0.0 0.0 - 0.2 %    Comment: Performed at [facility], [place] [address] [place], [place] [address]  Hemoglobin and hematocrit, blood     Status: Abnormal   Collection Time: 01/14/19 12:08 PM  Result Value Ref Range   Hemoglobin 9.0 (L) 12.0 - 15.0 g/dL   HCT 27.5 (L) 36.0 - 46.0 %    Comment: Performed at [facility], [place] [address], [place], [place] [address]  Hemoglobin and hematocrit, blood     Status: Abnormal   Collection Time: 01/14/19  5:40 PM  Result Value Ref Range   Hemoglobin 8.9 (L) 12.0 - 15.0 g/dL   HCT 27.2 (L) 36.0 -  46.0 %    Comment: Performed at [facility], [place] [address], [place], [place] [address]  Hemoglobin and hematocrit, blood     Status: Abnormal   Collection Time: 01/14/19 11:25 PM  Result Value Ref Range   Hemoglobin 7.9 (L) 12.0 - 15.0 g/dL   HCT 23.1 (L) 36.0 - 46.0 %    Comment: Performed at [facility] [address], [place], [place] [address]  AM Hemoglobin and hematocrit, blood     Status: Abnormal   Collection Time: 01/15/19  6:28 AM  Result Value Ref Range   Hemoglobin 8.3 (L) 12.0 - 15.0 g/dL   HCT 25.2 (L) 36.0 - 46.0 %    Comment: Performed at [facility], [place] [address], [place], [place] [address]  Comprehensive metabolic panel     Status: Abnormal   Collection Time: 01/15/19  6:28 AM  Result Value Ref Range   Sodium 138 135 - 145 mmol/L   Potassium 2.5 (LL) 3.5 - 5.1 mmol/L    Comment: CRITICAL RESULT CALLED TO, READ BACK BY AND VERIFIED WITH: CHANEY,J RN @0828  ON [id number] BY FLEMINGS    Chloride 107 98 - 111 mmol/L   CO2 25 22 - 32 mmol/L   Glucose, Bld 89 70 - 99 mg/dL   BUN <5 (L) 8 - 23 mg/dL    Creatinine, Ser 0.38 (L) 0.44 - 1.00 mg/dL   Calcium 7.4 (L) 8.9 - 10.3 mg/dL   Total Protein 3.7 (L) 6.5 - 8.1 g/dL   Albumin 1.7 (L) 3.5 - 5.0 g/dL   AST 13 (L) 15 - 41 U/L   ALT 10 0 - 44 U/L   Alkaline Phosphatase 27 (L) 38 - 126 U/L   Total Bilirubin 0.1 (L) 0.3 - 1.2 mg/dL   GFR calc non Af Amer >60 >60 mL/min   GFR calc Af Amer >60 >60 mL/min   Anion gap 6 5 - 15    Comment: Performed at [facility], [place] [address] [place], [place] [address]  Hemoglobin and hematocrit, blood     Status: Abnormal   Collection Time: 01/15/19 11:12 AM  Result Value Ref Range   Hemoglobin 9.2 (L) 12.0 - 15.0 g/dL   HCT 28.1 (L) 36.0 - 46.0 %    Comment: Performed at [facility], [place] [address], [place], [place] [address]  Magnesium     Status: Abnormal   Collection Time: 01/15/19 11:12 AM  Result Value Ref Range   Magnesium 1.4 (L) 1.7 - 2.4 mg/dL    Comment: Performed at [facility] [address] [place], [place] [address]    Studies/Results: No results found.  Medications: I have reviewed the patient's current medications.  Assessment: Severe esophagitis and esophageal ulcer Hemoglobin stable at 9.2, BUN less than 5, no further evidence of GI bleed  Plan: We will start on regular diet. H. pylori stool antigen pending, will be followed as an outpatient. Will change PPI to twice daily, discussed with patient that she will need to continue PPI twice daily for at least another 2 months. On sucralfate 1 g 4 times a day, continue for 2 weeks. GI will sign off, Ok to DC from GI standpoint, recall if needed   [name], MD 01/15/2019, 4:06 PM

## 2019-01-15 NOTE — Progress Notes (Signed)
PROGRESS NOTE    Alicia Holder  [id number] DOB: 06/01/1950 DOA: 01/13/2019 PCP: Ellamae Sia, MD   Brief Narrative:  HPI on 01/13/2019 by Dr. Quintella Baton This is a 68 year old female was admitted at [facility] hospital with GI bleed.  She has severe anemia with initial hemoglobin is 5.9.  She also had right upper quadrant pain.  Since admission she has been transfused a total of 4 units packed red blood cells.  At [facility] she continuedto have large bloody stools and drops in her blood pressure. She was resuscitated with IV fluids and PRBC. Patient was evaluated by GI who recommended urgent EGD given hemodynamic instability. She underwent a EGD yesterday which showed red blood cells in the distal esophagus and blood clot in the gastric fundus but no definite source of her bleeding.  As patient is bleeding significantly with no source being found, she was transferred to [facility].  The patient was discussed with IR for vascular intervention Dr. Earleen Newport aware of the transfer.  Patient's right upper quadrant pain has improved. Her  LFTs and bilirubin normal . Ultrasound right upper quadrant shows cholelithiasis without evidence of cholecystitis. CT abdomen pelvis also shows cholelithiasis also without cholecystitis.  Last week she got SOB and a chest cold. She was hospitalized and discharged last Friday.  Friday she threw up blood, a lot. She got to where she couldn't walk. Her stool was a bit bloody. She had abdominal pain greater in her epigastric region and and right side. She has a h/o a hernia and GERD. She has no h/o stomach ulcers or GI bleed. She seldom eats spicy food. She does not use NSAIDs for  pain except her daily low dose ASA. She had a precancerous polp removed in 2012. Her last colonoscopy was 4 years ago. She is due for a repeat colonoscopy at 5 years. Currently she had a bit of melanotic stool yesteray. She states she has not vomited blood in 24 hours.  History provided  by the patient who is weak but alert and oriented.  Interim history Patient admitted with GI bleed from [place] [facility].  IR as well as GI consulted and appreciated.  Continue to monitor H&H. S/p EGD.  Assessment & Plan   Acute GI bleed/acute blood loss anemia -Hemoglobin is noted to be as low as 5.3 on 01/11/2019 -Did receive blood transfusions -Continue IV Protonix -Morning of admission to [facility], it was noted the patient had 250 cc of bright red blood per rectum.  She has not had any further hematemesis since admission -Interventional radiology consulted and appreciated -Patient status post EGD on 12/12 at [facility] which showed large amount of red blood in the distal esophagus and gastric fundus with source not pinpointed.  Patient did have hemostatic spray in both areas with no blood noted afterward. -Gastroenterology consulted and appreciated -s/p EGD today showing severe esophagitis with esophageal ulceration from 25 to 35 cm from insertion.  Spot was read and possible site of prior bleeding.  Endo Clip placed.  Diffuse erythema noted in the gastric body, incisura, antrum and pylorus. -Discussed with Dr. Parke Simmers, Gastroenterology, recommending additional Protonix drip for 24 hours then switch to PPI this daily for 8 weeks- have placed patient on protonix 40mg  BID -Of note anemia panel showed adequate iron and iron storage however vitamin B12 was 173, B12 supplemented -hemglobin up to 9.2 -monitor CBC -Patient tolerated clear liquid diet, will continue to advance diet as tolerated.  Leukocytosis with lactic acidosis -Suspect reactive,  WBC count improving and has now WNL; lactic acidosis resolved -Recent blood cultures [facility] show no growth to date -UA and chest x-ray showed no infection  Hypokalemia/hypomagnesemia -Potassium 3.5, magnesium 1.4 -Place and continue to monitor  RUQ TTP -Improved however no clear etiology -No evidence of a sending cholangitis -LFTs are normal -CT and right  upper quadrant ultrasound unremarkable -Continue pain control  Secondary polycythemia -Patient follows with hematology oncology, Dr. 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  Have transitioned over to IV Protonix 40 mg twice daily.  Replacing potassium of 2.5 and magnesium.  Suspect patient will be discharged within 1 to 2 days pending improvement of electrolyte abnormalities.  Consultants Gastroenterology Interventional Radiology  Procedures  EGD  Antibiotics   Anti-infectives (From admission, onward)   None      Subjective:   Joliene Class seen and examined today.  Patient with no abdominal pain this morning, no nausea or vomiting.  Was able to tolerate clear liquid diet.  Denies any further blood per rectum.  Denies current chest pain or shortness of breath, dizziness or headache. Objective:   Vitals:   01/15/19 0754 01/15/19 0801 01/15/19 0902 01/15/19 1142  BP: 110/72 110/72    Pulse: 82     Resp: 18   20  Temp: 98.4 F (36.9 C)     TempSrc: Oral     SpO2: 95%  95%   Weight:      Height:         Intake/Output Summary (Last 24 hours) at 01/15/2019 1234 Last data filed at 01/15/2019 1109 Gross per 24 hour  Intake 673.42 ml  Output 2950 ml  Net -2276.58 ml   Filed Weights   01/14/19 0844  Weight: 70.8 kg   Exam  General: Well developed, well nourished, elderly, NAD, appears stated age  55: NCAT, mucous membranes moist.   Cardiovascular: S1 S2 auscultated, RRR  Respiratory: Clear to auscultation bilaterally  Abdomen: Soft, nontender, nondistended, + bowel sounds  Extremities: warm dry without cyanosis clubbing or edema  Neuro: AAOx3, nonfocal  Psych: pleasant   Data Reviewed: I have personally reviewed following labs and imaging studies  CBC: Recent Labs  Lab 01/11/19 1923 01/11/19 2317 01/12/19 0428 01/12/19 2347 01/13/19 0245 01/14/19 0335 01/14/19 1208 01/14/19 1740 01/14/19 2325 01/15/19 0628 01/15/19 1112  WBC 21.6* 18.3* 13.8* 13.2* 12.1* 10.9*  --   --   --   --   --   NEUTROABS 16.0* 14.5*  --  9.9*  --   --   --   --   --   --   --   HGB 5.9* 5.3* 6.1* 8.8* 8.3* 7.3* 9.0*  8.9* 7.9* 8.3* 9.2*  HCT 19.0* 16.5* 18.5* 25.7* 23.4* 21.9* 27.5* 27.2* 23.1* 25.2* 28.1*  MCV 91.8 88.2 88.1 88.0 84.5 91.6  --   --   --   --   --   PLT 376 304 271 201 192 216  --   --   --   --   --    Basic Metabolic Panel: Recent Labs  Lab 01/11/19 2317 01/12/19 0428 01/13/19 0245 01/14/19 0335 01/15/19 0628 01/15/19 1112  NA 130* 132* 135 137 138  --   K 3.7 3.5 3.1* 3.2* 2.5*  --   CL 102 104 111 110 107  --   CO2 23 23 19* 21* 25  --   GLUCOSE 111* 94 88 75 89  --   BUN 25* 20 12 5* <5*  --   CREATININE 0.49 0.37* 0.31* 0.32* 0.38*  --   CALCIUM 7.0* 7.0* 6.2* 6.7* 7.4*  --   MG  --   --   --   --   --  1.4*   GFR: Estimated Creatinine Clearance: 64 mL/min (A) (by C-G formula based on SCr of 0.38 mg/dL (L)). Liver Function Tests: Recent Labs  Lab 01/11/19 1923 01/11/19 2317 01/12/19 0428 01/15/19 0628  AST 28 19 24  13*  ALT 19 17 16 10    ALKPHOS 36* 33* 28* 27*  BILITOT 0.2*  0.5 0.2* 0.4 0.1*  PROT 4.9* 4.8* 4.3* 3.7*  ALBUMIN 2.4* 2.3* 2.0* 1.7*   Recent Labs  Lab 01/11/19 1923  LIPASE 34   No results for input(s): AMMONIA in the last 168 hours. Coagulation Profile: Recent Labs  Lab 01/11/19 1923 01/12/19 0428  INR 1.1 1.1   Cardiac Enzymes: No results for input(s): CKTOTAL, CKMB, CKMBINDEX, TROPONINI in the last 168 hours. BNP (last 3 results) No results for input(s): PROBNP in the last 8760 hours. HbA1C: No results for input(s): HGBA1C in the last 72 hours. CBG: No results for input(s): GLUCAP in the last 168 hours. Lipid Profile: No results for input(s): CHOL, HDL, LDLCALC, TRIG, CHOLHDL, LDLDIRECT in the last 72 hours. Thyroid Function Tests: No results for input(s): TSH, T4TOTAL, FREET4, T3FREE, THYROIDAB in the last 72 hours. Anemia Panel: No results for input(s): VITAMINB12, FOLATE, FERRITIN, TIBC, IRON, RETICCTPCT in the last 72 hours. Urine analysis:    Component Value Date/Time   COLORURINE YELLOW (A) 01/11/2019 2144   APPEARANCEUR CLEAR (A) 01/11/2019 2144   LABSPEC >1.046 (H) 01/11/2019 2144   PHURINE 6.0 01/11/2019 2144   GLUCOSEU NEGATIVE 01/11/2019 2144   HGBUR SMALL (A) 01/11/2019 2144   [place] NEGATIVE 01/11/2019 2144   KETONESUR 5 (A) 01/11/2019 2144   PROTEINUR NEGATIVE 01/11/2019 2144   NITRITE NEGATIVE 01/11/2019 2144   LEUKOCYTESUR NEGATIVE 01/11/2019 2144   Sepsis Labs: @LABRCNTIP (procalcitonin:4,lacticidven:4)  ) Recent Results (from the past 240 hour(s))  Culture, blood (Routine x 2)     Status: None (Preliminary result)   Collection Time: 01/11/19  7:23 PM   Specimen: BLOOD  Result Value Ref Range Status   Specimen Description BLOOD LEFT FOREARM  Final   Special Requests   Final    BOTTLES DRAWN AEROBIC AND ANAEROBIC Blood Culture results may not be optimal due to an excessive volume of blood received in culture bottles   Culture   Final    NO GROWTH 4  DAYS Performed at [facility], [address], [place], [place] [address]    Report Status PENDING  Incomplete  Culture, blood (  Routine x 2)     Status: None (Preliminary result)   Collection Time: 01/11/19  7:41 PM   Specimen: BLOOD  Result Value Ref Range Status   Specimen Description BLOOD LEFT FOREARM  Final   Special Requests   Final    BOTTLES DRAWN AEROBIC AND ANAEROBIC Blood Culture adequate volume   Culture   Final    NO GROWTH 4 DAYS Performed at [facility], [address], [place], [place] [address]    Report Status PENDING  Incomplete  SARS CORONAVIRUS 2 (TAT 6-24 HRS) Nasopharyngeal Nasopharyngeal Swab     Status: None   Collection Time: 01/11/19  9:44 PM   Specimen: Nasopharyngeal Swab  Result Value Ref Range Status   SARS Coronavirus 2 NEGATIVE NEGATIVE Final    Comment: (NOTE) SARS-CoV-2 target nucleic acids are NOT DETECTED. The SARS-CoV-2 RNA is generally detectable in upper and lower respiratory specimens during the acute phase of infection. Negative results do not preclude SARS-CoV-2 infection, do not rule out co-infections with other pathogens, and should not be used as the sole basis for treatment or other patient management decisions. Negative results must be combined with clinical observations, patient history, and epidemiological information. The expected result is Negative. Fact Sheet for Patients: SugarRoll.be Fact Sheet for Healthcare Providers: https://www.woods-mathews.com/ This test is not yet approved or cleared by the [place] FDA and  has been authorized for detection and/or diagnosis of SARS-CoV-2 by FDA under an Emergency Use Authorization (EUA). This EUA will remain  in effect (meaning this test can be used) for the duration of the COVID-19 declaration under Section 56 4(b)(1) of the Act, 21 U.S.C. section 360bbb-3(b)(1), unless the authorization is terminated or revoked  sooner. Performed at [facility], [place] [address], [place], [place] [address]       Radiology Studies: No results found.   Scheduled Meds: . docusate sodium  100 mg Oral Daily  . feeding supplement  1 Container Oral TID BM  . lisinopril  20 mg Oral Daily  . mometasone-formoterol  2 puff Inhalation BID  . montelukast  10 mg Oral QHS  . multivitamin with minerals  1 tablet Oral Daily  . pantoprazole  40 mg Intravenous Q12H  . potassium chloride  40 mEq Oral BID  . sertraline  50 mg Oral Daily  . simvastatin  20 mg Oral q1800  . sucralfate  1 g Oral TID AC & HS  . umeclidinium bromide  1 puff Inhalation Daily   Continuous Infusions: . sodium chloride Stopped (01/14/19 1048)  . 0.9 % NaCl with KCl 20 mEq / L 100 mL/hr at 01/14/19 0200  . pantoprazole (PROTONIX) IVPB    . pantoprozole (PROTONIX) infusion 8 mg/hr (01/15/19 0906)     LOS: 2 days   Time Spent in minutes   45 minutes  Nirali Magouirk D.O. on 01/15/2019 at 12:34 PM  Between 7am to 7pm - Please see pager noted on [email or web address]  After 7pm go to [email or web address]  And look for the night coverage person covering for me after hours  [facility] Office  [phone]

## 2019-01-15 NOTE — Progress Notes (Signed)
[facility] notified RN of critical K+. Dr. Ree Kida notified.  Jerald Kief, RN

## 2019-01-16 DIAGNOSIS — I10 Essential (primary) hypertension: Secondary | ICD-10-CM

## 2019-01-16 DIAGNOSIS — D62 Acute posthemorrhagic anemia: Secondary | ICD-10-CM

## 2019-01-16 DIAGNOSIS — J449 Chronic obstructive pulmonary disease, unspecified: Secondary | ICD-10-CM

## 2019-01-16 DIAGNOSIS — Z716 Tobacco abuse counseling: Secondary | ICD-10-CM

## 2019-01-16 DIAGNOSIS — K922 Gastrointestinal hemorrhage, unspecified: Secondary | ICD-10-CM

## 2019-01-16 DIAGNOSIS — D45 Polycythemia vera: Secondary | ICD-10-CM

## 2019-01-16 LAB — CULTURE, BLOOD (ROUTINE X 2)
Culture: NO GROWTH
Culture: NO GROWTH
Special Requests: ADEQUATE

## 2019-01-16 LAB — HEMOGLOBIN AND HEMATOCRIT, BLOOD
HCT: 25.2 % — ABNORMAL LOW (ref 36.0–46.0)
HCT: 25 % — ABNORMAL LOW (ref 36.0–46.0)
Hemoglobin: 8.4 g/dL — ABNORMAL LOW (ref 12.0–15.0)
Hemoglobin: 8.1 g/dL — ABNORMAL LOW (ref 12.0–15.0)

## 2019-01-16 LAB — CBC
HCT: 22 % — ABNORMAL LOW (ref 36.0–46.0)
Hemoglobin: 7.4 g/dL — ABNORMAL LOW (ref 12.0–15.0)
MCH: 30.3 pg (ref 26.0–34.0)
MCHC: 33.6 g/dL (ref 30.0–36.0)
MCV: 90.2 fL (ref 80.0–100.0)
Platelets: 259 K/uL (ref 150–400)
RBC: 2.44 MIL/uL — ABNORMAL LOW (ref 3.87–5.11)
RDW: 16.2 % — ABNORMAL HIGH (ref 11.5–15.5)
WBC: 9.6 K/uL (ref 4.0–10.5)
nRBC: 0 % (ref 0.0–0.2)

## 2019-01-16 LAB — BASIC METABOLIC PANEL WITH GFR
Anion gap: 8 (ref 5–15)
BUN: <5 mg/dL — ABNORMAL LOW (ref 8–23)
CO2: 26 mmol/L (ref 22–32)
Calcium: 7.4 mg/dL — ABNORMAL LOW (ref 8.9–10.3)
Chloride: 104 mmol/L (ref 98–111)
Creatinine, Ser: 0.5 mg/dL (ref 0.44–1.00)
GFR calc Af Amer: >60 mL/min
GFR calc non Af Amer: >60 mL/min
Glucose, Bld: 95 mg/dL (ref 70–99)
Potassium: 2.8 mmol/L — ABNORMAL LOW (ref 3.5–5.1)
Sodium: 138 mmol/L (ref 135–145)

## 2019-01-16 LAB — MAGNESIUM: Magnesium: 1.4 mg/dL — ABNORMAL LOW (ref 1.7–2.4)

## 2019-01-16 MED ORDER — POTASSIUM CHLORIDE CRYS ER 20 MEQ PO TBCR
40.0000 meq | EXTENDED_RELEASE_TABLET | ORAL | Status: AC
  Administered 2019-01-16 (×3): 40 meq via ORAL
  Filled 2019-01-16 (×3): qty 2

## 2019-01-16 MED ORDER — MAGNESIUM SULFATE 4 GM/100ML IV SOLN
4.0000 g | Freq: Once | INTRAVENOUS | Status: AC
  Administered 2019-01-16: 4 g via INTRAVENOUS
  Filled 2019-01-16: qty 100

## 2019-01-16 MED ORDER — PANTOPRAZOLE SODIUM 40 MG PO TBEC
40.0000 mg | DELAYED_RELEASE_TABLET | Freq: Two times a day (BID) | ORAL | Status: DC
  Administered 2019-01-16 – 2019-01-18 (×5): 40 mg via ORAL
  Filled 2019-01-16 (×5): qty 1

## 2019-01-16 NOTE — Progress Notes (Signed)
PROGRESS NOTE    Alicia Holder  [id number] DOB: 1950/05/30 DOA: 01/13/2019 PCP: Ellamae Sia, MD   Brief Narrative:  HPI on 01/13/2019 by Dr. Quintella Baton This is a 68 year old female was admitted at [facility] hospital with GI bleed.  She has severe anemia with initial hemoglobin is 5.9.  She also had right upper quadrant pain.  Since admission she has been transfused a total of 4 units packed red blood cells.  At [facility] she continuedto have large bloody stools and drops in her blood pressure. She was resuscitated with IV fluids and PRBC. Patient was evaluated by GI who recommended urgent EGD given hemodynamic instability. She underwent a EGD yesterday which showed red blood cells in the distal esophagus and blood clot in the gastric fundus but no definite source of her bleeding.  As patient is bleeding significantly with no source being found, she was transferred to [facility].  The patient was discussed with IR for vascular intervention Dr. Earleen Newport aware of the transfer.  Patient's right upper quadrant pain has improved. Her  LFTs and bilirubin normal . Ultrasound right upper quadrant shows cholelithiasis without evidence of cholecystitis. CT abdomen pelvis also shows cholelithiasis also without cholecystitis.  Last week she got SOB and a chest cold. She was hospitalized and discharged last Friday.  Friday she threw up blood, a lot. She got to where she couldn't walk. Her stool was a bit bloody. She had abdominal pain greater in her epigastric region and and right side. She has a h/o a hernia and GERD. She has no h/o stomach ulcers or GI bleed. She seldom eats spicy food. She does not use NSAIDs for  pain except her daily low dose ASA. She had a precancerous polp removed in 2012. Her last colonoscopy was 4 years ago. She is due for a repeat colonoscopy at 5 years. Currently she had a bit of melanotic stool yesteray. She states she has not vomited blood in 24 hours.  History provided  by the patient who is weak but alert and oriented.  Interim history Patient admitted with GI bleed from [facility].  IR as well as GI consulted and appreciated.  Continue to monitor H&H. S/p EGD.  Assessment & Plan   Acute GI bleed/acute blood loss anemia Patient presenting as a transfer from [facility] for concern of acute GI bleed.  Hemoglobin noted to be 5.3 on 01/11/2019.  She was started on IV Protonix drip. Patient status post EGD on 12/12 at [facility] which showed large amount of red blood in the distal esophagus and gastric fundus with source not pinpointed.  Patient did have hemostatic spray in both areas with no blood noted afterward.  Due to continued bleeding, patient was transferred to [facility] for possible IR intervention.  Patient had a repeat EGD by GI, Dr. Therisa Doyne on 01/14/2019 with findings of severe esophagitis with esophageal ulceration with placement of a hemostatic clip. --iron studies with adequate iron but B12 173, B12 supplemental --Transfused total 6 units PRBCs on 01/12/2019 --IV Protonix drip was transitioned to Protonix 40 mg p.o. twice daily, plan for 8 weeks --Continue sulcralfate 1g PO QID for 2 weeks --Currently tolerating advance diet --Hgb 5.3-->9.1-->7.3-->9.0-->7.9-->8.3-->9.2-->8.3-->8.5-->7.4-->8.4 --continue to monitor Hgb, if remains stable, likely DC home tomorrow  Leukocytosis with lactic acidosis -Suspect reactive, WBC count improving and has now WNL; lactic acidosis resolved -Recent blood cultures [facility] show no growth to date -UA and chest x-ray showed no infection  Hypokalemia/hypomagnesemia -Potassium 3.5,  magnesium 1.4 -Place and continue to monitor  RUQ TTP -Improved however no clear etiology -No evidence of a sending cholangitis -LFTs are normal -CT and right upper quadrant ultrasound unremarkable -Continue pain control  Secondary polycythemia -Patient follows with hematology oncology, Dr. 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  Likely discharge home tomorrow if electrolytes improved and hemoglobin remained stable.  Consultants Gastroenterology Interventional Radiology  Procedures  EGD  Antibiotics   Anti-infectives (From admission, onward)   None      Subjective:   Alicia Holder was seen and examined at bedside this morning. Patient with no abdominal pain this morning, no nausea or vomiting.  Tolerating diet denies any further blood per rectum.  Denies current chest pain or shortness of breath, dizziness or headache.  No acute events overnight per nurse staff. Objective:   Vitals:   01/15/19 1945 01/16/19 0502 01/16/19 0831 01/16/19 1251  BP: 96/66 105/62  111/77  Pulse: 98 73  79  Resp: 17 16  (!) 24  Temp: 98 F (36.7 C) 98 F (36.7 C)  98.2 F (36.8 C)  TempSrc: Oral Oral  Oral  SpO2: 96% 98% 98% 99%  Weight:      Height:        Intake/Output Summary (Last 24 hours) at  01/16/2019 1528 Last data filed at 01/15/2019 1839 Gross per 24 hour  Intake 100 ml  Output --  Net 100 ml   Filed Weights   01/14/19 0844  Weight: 70.8 kg   Exam  General: Well developed, well nourished, elderly, NAD, appears stated age  40: NCAT, mucous membranes moist.   Cardiovascular: S1 S2 auscultated, RRR  Respiratory: Clear to auscultation bilaterally  Abdomen: Soft, nontender, nondistended, + bowel sounds  Extremities: warm dry without cyanosis clubbing or edema  Neuro: AAOx3, nonfocal  Psych: pleasant   Data Reviewed: I have personally reviewed following labs and imaging studies  CBC: Recent Labs  Lab 01/11/19 1923 01/11/19 2317 01/12/19 0428 01/12/19 2347 01/13/19 0245 01/14/19 0335 01/15/19 1112 01/15/19 1751 01/15/19 2320 01/16/19 0547 01/16/19 1052  WBC 21.6* 18.3* 13.8* 13.2* 12.1* 10.9*  --   --   --  9.6  --   NEUTROABS 16.0* 14.5*  --  9.9*  --   --   --   --   --   --   --   HGB 5.9* 5.3* 6.1* 8.8* 8.3* 7.3* 9.2* 8.3* 8.5* 7.4* 8.4*  HCT 19.0* 16.5* 18.5* 25.7* 23.4* 21.9* 28.1* 25.3* 26.1* 22.0* 25.2*  MCV 91.8 88.2 88.1 88.0 84.5 91.6  --   --   --  90.2  --   PLT 376 304 271 201 192 216  --   --   --  259  --    Basic Metabolic Panel: Recent Labs  Lab 01/12/19 0428 01/13/19 0245 01/14/19 0335 01/15/19 0628 01/15/19 1112 01/16/19 0547  NA 132* 135 137 138  --  138  K 3.5 3.1* 3.2* 2.5*  --  2.8*  CL 104 111 110 107  --  104  CO2 23 19* 21* 25  --  26  GLUCOSE 94 88 75 89  --  95  BUN 20 12 5* <5*  --  <5*  CREATININE 0.37* 0.31* 0.32* 0.38*  --  0.50  CALCIUM 7.0* 6.2* 6.7* 7.4*  --  7.4*  MG  --   --   --   --  1.4* 1.4*   GFR: Estimated Creatinine Clearance: 64 mL/min (by C-G formula based on SCr of 0.5 mg/dL). Liver Function Tests: Recent Labs  Lab 01/11/19 1923 01/11/19 2317 01/12/19 0428 01/15/19 0628  AST 28 19 24  13*  ALT 19 17 16 10   ALKPHOS 36* 33* 28* 27*  BILITOT 0.2*  0.5 0.2* 0.4 0.1*  PROT 4.9*  4.8* 4.3* 3.7*  ALBUMIN 2.4* 2.3* 2.0* 1.7*   Recent Labs  Lab 01/11/19 1923  LIPASE 34   No results for input(s): AMMONIA in the last 168 hours. Coagulation Profile: Recent Labs  Lab 01/11/19 1923 01/12/19 0428  INR 1.1 1.1   Cardiac Enzymes: No results for input(s): CKTOTAL, CKMB, CKMBINDEX, TROPONINI in the last 168 hours. BNP (last 3 results) No results for input(s): PROBNP in the last 8760 hours. HbA1C: No results for input(s): HGBA1C in the last 72 hours. CBG: No results for input(s): GLUCAP in the last 168 hours. Lipid Profile: No results for input(s): CHOL, HDL, LDLCALC, TRIG, CHOLHDL, LDLDIRECT in the last 72 hours. Thyroid Function Tests: No results for input(s): TSH, T4TOTAL, FREET4, T3FREE, THYROIDAB in the last 72 hours. Anemia Panel: No results for input(s): VITAMINB12, FOLATE, FERRITIN, TIBC, IRON, RETICCTPCT in the last 72 hours. Urine analysis:    Component Value Date/Time   COLORURINE YELLOW (A) 01/11/2019 2144   APPEARANCEUR CLEAR (A) 01/11/2019 2144   LABSPEC >1.046 (H) 01/11/2019 2144   PHURINE 6.0 01/11/2019 2144   GLUCOSEU NEGATIVE 01/11/2019 2144   HGBUR SMALL (A) 01/11/2019 2144   [place] NEGATIVE 01/11/2019 2144   KETONESUR 5 (A) 01/11/2019 2144   PROTEINUR NEGATIVE 01/11/2019 2144   NITRITE NEGATIVE 01/11/2019 2144   LEUKOCYTESUR NEGATIVE 01/11/2019 2144   Sepsis Labs: @LABRCNTIP (procalcitonin:4,lacticidven:4)  ) Recent Results (from the past 240 hour(s))  Culture, blood (Routine x 2)     Status: None   Collection Time: 01/11/19  7:23 PM   Specimen: BLOOD  Result Value Ref Range Status   Specimen Description BLOOD LEFT FOREARM  Final   Special Requests   Final    BOTTLES DRAWN AEROBIC AND ANAEROBIC Blood Culture results may not be optimal due to an excessive volume of blood received in culture bottles   Culture   Final    NO GROWTH 5 DAYS Performed at [facility], [address], [place], [place] [address]     Report Status 01/16/2019 FINAL  Final  Culture, blood (Routine x 2)     Status: None   Collection Time: 01/11/19  7:41 PM   Specimen: BLOOD  Result Value Ref Range Status   Specimen Description BLOOD LEFT FOREARM  Final   Special Requests   Final  BOTTLES DRAWN AEROBIC AND ANAEROBIC Blood Culture adequate volume   Culture   Final    NO GROWTH 5 DAYS Performed at [facility], [address] [place], [place] [address]    Report Status 01/16/2019 FINAL  Final  SARS CORONAVIRUS 2 (TAT 6-24 HRS) Nasopharyngeal Nasopharyngeal Swab     Status: None   Collection Time: 01/11/19  9:44 PM   Specimen: Nasopharyngeal Swab  Result Value Ref Range Status   SARS Coronavirus 2 NEGATIVE NEGATIVE Final    Comment: (NOTE) SARS-CoV-2 target nucleic acids are NOT DETECTED. The SARS-CoV-2 RNA is generally detectable in upper and lower respiratory specimens during the acute phase of infection. Negative results do not preclude SARS-CoV-2 infection, do not rule out co-infections with other pathogens, and should not be used as the sole basis for treatment or other patient management decisions. Negative results must be combined with clinical observations, patient history, and epidemiological information. The expected result is Negative. Fact Sheet for Patients: SugarRoll.be Fact Sheet for Healthcare Providers: https://www.woods-mathews.com/ This test is not yet approved or cleared by the [place] FDA and  has been authorized for detection and/or diagnosis of SARS-CoV-2 by FDA under an Emergency Use Authorization (EUA). This EUA will remain  in effect (meaning this test can be used) for the duration of the COVID-19 declaration under Section 56 4(b)(1) of the Act, 21 U.S.C. section 360bbb-3(b)(1), unless the authorization is terminated or revoked sooner. Performed at [facility], [place] [address] [place],  [address]        Radiology Studies: No results found.   Scheduled Meds: . docusate sodium  100 mg Oral Daily  . feeding supplement  1 Container Oral TID BM  . lisinopril  20 mg Oral Daily  . mometasone-formoterol  2 puff Inhalation BID  . montelukast  10 mg Oral QHS  . multivitamin with minerals  1 tablet Oral Daily  . pantoprazole  40 mg Oral BID  . potassium chloride  40 mEq Oral Q3H  . sertraline  50 mg Oral Daily  . simvastatin  20 mg Oral q1800  . sucralfate  1 g Oral TID AC & HS  . umeclidinium bromide  1 puff Inhalation Daily   Continuous Infusions: . sodium chloride Stopped (01/14/19 1048)  . 0.9 % NaCl with KCl 20 mEq / L 100 mL/hr at 01/14/19 0200     LOS: 3 days   Time Spent in minutes   34 minutes  Brenson Hartman J [place] D.O. on 01/16/2019 at 3:28 PM [facility]

## 2019-01-17 ENCOUNTER — Other Ambulatory Visit: Payer: Self-pay

## 2019-01-17 ENCOUNTER — Inpatient Hospital Stay: Payer: Medicare HMO

## 2019-01-17 ENCOUNTER — Encounter (HOSPITAL_COMMUNITY): Payer: Self-pay | Admitting: Family Medicine

## 2019-01-17 DIAGNOSIS — E785 Hyperlipidemia, unspecified: Secondary | ICD-10-CM

## 2019-01-17 LAB — CBC
HCT: 22.6 % — ABNORMAL LOW (ref 36.0–46.0)
Hemoglobin: 7.3 g/dL — ABNORMAL LOW (ref 12.0–15.0)
MCH: 29.9 pg (ref 26.0–34.0)
MCHC: 32.3 g/dL (ref 30.0–36.0)
MCV: 92.6 fL (ref 80.0–100.0)
Platelets: 235 10*3/uL (ref 150–400)
RBC: 2.44 MIL/uL — ABNORMAL LOW (ref 3.87–5.11)
RDW: 16.2 % — ABNORMAL HIGH (ref 11.5–15.5)
WBC: 9.4 10*3/uL (ref 4.0–10.5)
nRBC: 0 % (ref 0.0–0.2)

## 2019-01-17 LAB — MAGNESIUM: Magnesium: 1.8 mg/dL (ref 1.7–2.4)

## 2019-01-17 LAB — BASIC METABOLIC PANEL
Anion gap: 5 (ref 5–15)
BUN: <5 mg/dL — ABNORMAL LOW (ref 8–23)
CO2: 28 mmol/L (ref 22–32)
Calcium: 7.6 mg/dL — ABNORMAL LOW (ref 8.9–10.3)
Chloride: 106 mmol/L (ref 98–111)
Creatinine, Ser: 0.48 mg/dL (ref 0.44–1.00)
GFR calc Af Amer: >60 mL/min (ref >60)
GFR calc non Af Amer: >60 mL/min (ref >60)
Glucose, Bld: 92 mg/dL (ref 70–99)
Potassium: 3.7 mmol/L (ref 3.5–5.1)
Sodium: 139 mmol/L (ref 135–145)

## 2019-01-17 LAB — PREPARE RBC (CROSSMATCH)

## 2019-01-17 LAB — HEMOGLOBIN AND HEMATOCRIT, BLOOD
HCT: 23.4 % — ABNORMAL LOW (ref 36.0–46.0)
HCT: 28.2 % — ABNORMAL LOW (ref 36.0–46.0)
Hemoglobin: 7.6 g/dL — ABNORMAL LOW (ref 12.0–15.0)
Hemoglobin: 9.2 g/dL — ABNORMAL LOW (ref 12.0–15.0)

## 2019-01-17 MED ORDER — SODIUM CHLORIDE 0.9% IV SOLUTION: Freq: Once | INTRAVENOUS | Status: DC

## 2019-01-17 NOTE — Care Management Important Message (Signed)
Important Message  Patient Details  Name: Alicia Holder MRN: [id number] Date of Birth: [birth year]   Medicare Important Message Given:  Yes     [name] 01/17/2019, 1:01 PM

## 2019-01-17 NOTE — Progress Notes (Addendum)
PROGRESS NOTE    FRANKLYN DAMIEN  [id number] DOB: 1950/05/12 DOA: 01/13/2019 PCP: Ellamae Sia, MD   Brief Narrative:  HPI on 01/13/2019 by Dr. Quintella Baton This is a 68 year old female was admitted at [facility] with GI bleed.  She has severe anemia with initial hemoglobin is 5.9.  She also had right upper quadrant pain.  Since admission she has been transfused a total of 4 units packed red blood cells.  At [facility] she continuedto have large bloody stools and drops in her blood pressure. She was resuscitated with IV fluids and PRBC. Patient was evaluated by GI who recommended urgent EGD given hemodynamic instability. She underwent a EGD yesterday which showed red blood cells in the distal esophagus and blood clot in the gastric fundus but no definite source of her bleeding.  As patient is bleeding significantly with no source being found, she was transferred to [facility].  The patient was discussed with IR for vascular intervention Dr. Earleen Newport aware of the transfer.  Patient's right upper quadrant pain has improved. Her  LFTs and bilirubin normal . Ultrasound right upper quadrant shows cholelithiasis without evidence of cholecystitis. CT abdomen pelvis also shows cholelithiasis also without cholecystitis.  Last week she got SOB and a chest cold. She was hospitalized and discharged last Friday.  Friday she threw up blood, a lot. She got to where she couldn't walk. Her stool was a bit bloody. She had abdominal pain greater in her epigastric region and and right side. She has a h/o a hernia and GERD. She has no h/o stomach ulcers or GI bleed. She seldom eats spicy food. She does not use NSAIDs for  pain except her daily low dose ASA. She had a precancerous polp removed in 2012. Her last colonoscopy was 4 years ago. She is due for a repeat colonoscopy at 5 years. Currently she had a bit of melanotic stool yesteray. She states she has not vomited blood in 24 hours.  History provided  by the patient who is weak but alert and oriented.  Interim history Patient admitted with GI bleed from [facility].  IR as well as GI consulted and appreciated.  Continue to monitor H&H. S/p EGD.  Assessment & Plan   Acute GI bleed/acute blood loss anemia Patient presenting as a transfer from [facility] for concern of acute GI bleed.  Hemoglobin noted to be 5.3 on 01/11/2019.  She was started on IV Protonix drip. Patient status post EGD on 12/12 at [facility] which showed large amount of red blood in the distal esophagus and gastric fundus with source not pinpointed.  Patient did have hemostatic spray in both areas with no blood noted afterward.  Due to continued bleeding, patient was transferred to [facility] for possible IR intervention.  Patient had a repeat EGD by GI, Dr. Therisa Doyne on 01/14/2019 with findings of severe esophagitis with esophageal ulceration with placement of a hemostatic clip. --iron studies with adequate iron but B12 173, B12 supplemental --Transfused total 6 units PRBCs on 01/12/2019 --IV Protonix drip was transitioned to Protonix 40 mg p.o. twice daily, plan for 8 weeks --Continue sulcralfate 1g PO QID for 2 weeks --Currently tolerating advance diet --Hgb 5.3-->9.1-->7.3-->9.0-->7.9-->8.3-->9.2-->8.3-->8.5-->7.4-->8.4-->7.3 --transfuse 1u pRBC today; recheck Hgb following transfusion --repeat Hgb in am, likely DC home tomorrow if stable  Leukocytosis with lactic acidosis --Suspect reactive, WBC count improving and has now WNL; lactic acidosis resolved --Recent blood cultures [facility] show no growth to date --UA and chest x-ray  showed no infection  Hypokalemia/hypomagnesemia --Potassium 3.7, magnesium 1.8 today  RUQ TTP Improved however no clear etiology, No evidence of ascending cholangitis. LFTs are normal. CT and right upper quadrant ultrasound unremarkable --Continue pain control  Secondary polycythemia --Patient follows with hematology  oncology, Dr. Susy Manor, for monthly CBC and phlebotomy  Essential hypertension --Home meds currently held in the setting of anemia and well controlled BP  Hyperlipidemia --Statin held  COPD with chronic hypoxic respiratory failure Patient uses 3 L of home oxygen at baseline, currently stable on home regimen.  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 Discussed with patient she does agree to DNR status  DVT Prophylaxis  SCDs Code Status: DNR (Discussed with daughter- POA)  Family Communication: Discussed plan of care with patient's daughter, [name] via telephone this morning. Disposition Plan: Continue inpatient, needs further replacement of electrolytes, potassium and magnesium today, continue to monitor hemoglobin, down to 7.3 this morning; will transfuse 1 unit PRBC today. Likely discharge home tomorrow if hemoglobin stable.  Consultants Gastroenterology Interventional Radiology  Procedures  EGD  Antibiotics   Anti-infectives (From admission, onward)   None      Subjective:   Emberli Lingerfelt was seen and examined at bedside this morning. Patient with no abdominal pain this morning, no nausea or vomiting.  Reports mild dyspnea.  Although oxygenating well on her 3 L nasal cannula which is her baseline.  Tolerating diet denies any further blood per rectum.  Denies current chest pain or shortness of breath, dizziness or headache.  No acute events overnight per nurse staff. Objective:   Vitals:   01/17/19 0337 01/17/19 0400 01/17/19 0812 01/17/19 0813  BP: 116/68     Pulse: 79     Resp: 20 18    Temp: 98 F (36.7 C)     TempSrc: Oral     SpO2: 95%  96%  96%  Weight:      Height:        Intake/Output Summary (Last 24 hours) at 01/17/2019 1014 Last data filed at 01/17/2019 1000 Gross per 24 hour  Intake 720 ml  Output 400 ml  Net 320 ml   Filed Weights   01/14/19 0844  Weight: 70.8 kg   Exam  General: Well developed, well nourished, elderly, NAD, appears stated age  51: NCAT, mucous membranes moist.   Cardiovascular: S1 S2 auscultated, RRR  Respiratory: Clear to auscultation bilaterally  Abdomen: Soft, nontender, nondistended, + bowel sounds  Extremities: warm dry without cyanosis clubbing or edema  Neuro: AAOx3, nonfocal  Psych: pleasant   Data Reviewed: I have personally reviewed following labs and imaging studies  CBC: Recent Labs  Lab 01/11/19 1923 01/11/19 2317 01/12/19 2347 01/13/19 0245 01/14/19 0335 01/16/19 0547 01/16/19 1052 01/16/19 1731 01/16/19 2344 01/17/19 0607  WBC 21.6* 18.3* 13.2* 12.1* 10.9* 9.6  --   --   --  9.4  NEUTROABS 16.0* 14.5* 9.9*  --   --   --   --   --   --   --   HGB 5.9* 5.3* 8.8* 8.3* 7.3* 7.4* 8.4* 8.1* 7.6* 7.3*  HCT 19.0* 16.5* 25.7* 23.4* 21.9* 22.0* 25.2* 25.0* 23.4* 22.6*  MCV  91.8 88.2 88.0 84.5 91.6 90.2  --   --   --  92.6  PLT 376 304 201 192 216 259  --   --   --  [id number]   Basic Metabolic Panel: Recent Labs  Lab 01/13/19 0245 01/14/19 0335 01/15/19 0628 01/15/19 1112 01/16/19 0547 01/17/19 0607  NA 135 137 138  --  138 139  K 3.1* 3.2* 2.5*  --  2.8* 3.7  CL 111 110 107  --  104 106  CO2 19* 21* 25  --  26 28  GLUCOSE 88 75 89  --  95 92  BUN 12 5* <5*  --  <5* <5*  CREATININE 0.31* 0.32* 0.38*  --  0.50 0.48  CALCIUM 6.2* 6.7* 7.4*  --  7.4* 7.6*  MG  --   --   --  1.4* 1.4* 1.8   GFR: Estimated Creatinine Clearance: 64 mL/min (by C-G formula based on SCr of 0.48 mg/dL). Liver Function Tests: Recent Labs  Lab 01/11/19 1923 01/11/19 2317 01/12/19 0428 01/15/19 0628  AST 28 19 24  13*  ALT 19 17 16 10   ALKPHOS 36* 33* 28* 27*  BILITOT  0.2*  0.5 0.2* 0.4 0.1*  PROT 4.9* 4.8* 4.3* 3.7*  ALBUMIN 2.4* 2.3* 2.0* 1.7*   Recent Labs  Lab 01/11/19 1923  LIPASE 34   No results for input(s): AMMONIA in the last 168 hours. Coagulation Profile: Recent Labs  Lab 01/11/19 1923 01/12/19 0428  INR 1.1 1.1   Cardiac Enzymes: No results for input(s): CKTOTAL, CKMB, CKMBINDEX, TROPONINI in the last 168 hours. BNP (last 3 results) No results for input(s): PROBNP in the last 8760 hours. HbA1C: No results for input(s): HGBA1C in the last 72 hours. CBG: No results for input(s): GLUCAP in the last 168 hours. Lipid Profile: No results for input(s): CHOL, HDL, LDLCALC, TRIG, CHOLHDL, LDLDIRECT in the last 72 hours. Thyroid Function Tests: No results for input(s): TSH, T4TOTAL, FREET4, T3FREE, THYROIDAB in the last 72 hours. Anemia Panel: No results for input(s): VITAMINB12, FOLATE, FERRITIN, TIBC, IRON, RETICCTPCT in the last 72 hours. Urine analysis:    Component Value Date/Time   COLORURINE YELLOW (A) 01/11/2019 2144   APPEARANCEUR CLEAR (A) 01/11/2019 2144   LABSPEC >1.046 (H) 01/11/2019 2144   PHURINE 6.0 01/11/2019 2144   GLUCOSEU NEGATIVE 01/11/2019 2144   HGBUR SMALL (A) 01/11/2019 2144   [place] NEGATIVE 01/11/2019 2144   KETONESUR 5 (A) 01/11/2019 2144   PROTEINUR NEGATIVE 01/11/2019 2144   NITRITE NEGATIVE 01/11/2019 2144   LEUKOCYTESUR NEGATIVE 01/11/2019 2144   Sepsis Labs: @LABRCNTIP (procalcitonin:4,lacticidven:4)  ) Recent Results (from the past 240 hour(s))  Culture, blood (Routine x 2)     Status: None   Collection Time: 01/11/19  7:23 PM   Specimen: BLOOD  Result Value Ref Range Status   Specimen Description BLOOD LEFT FOREARM  Final   Special Requests   Final    BOTTLES DRAWN AEROBIC AND ANAEROBIC Blood Culture results may not be optimal due to an excessive volume of blood received in culture bottles   Culture   Final    NO GROWTH 5 DAYS Performed at [facility], [address], [place], [place] [address]    Report Status 01/16/2019 FINAL  Final  Culture, blood (Routine x 2)     Status: None   Collection Time: 01/11/19  7:41 PM   Specimen: BLOOD  Result Value Ref Range Status   Specimen Description BLOOD LEFT FOREARM  Final  Special Requests   Final    BOTTLES DRAWN AEROBIC AND ANAEROBIC Blood Culture adequate volume   Culture   Final    NO GROWTH 5 DAYS Performed at [facility], [address] [place], [place] [address]    Report Status 01/16/2019 FINAL  Final  SARS CORONAVIRUS 2 (TAT 6-24 HRS) Nasopharyngeal Nasopharyngeal Swab     Status: None   Collection Time: 01/11/19  9:44 PM   Specimen: Nasopharyngeal Swab  Result Value Ref Range Status   SARS Coronavirus 2 NEGATIVE NEGATIVE Final    Comment: (NOTE) SARS-CoV-2 target nucleic acids are NOT DETECTED. The SARS-CoV-2 RNA is generally detectable in upper and lower respiratory specimens during the acute phase of infection. Negative results do not preclude SARS-CoV-2 infection, do not rule out co-infections with other pathogens, and should not be used as the sole basis for treatment or other patient management decisions. Negative results must be combined with clinical observations, patient history, and epidemiological information. The expected result is Negative. Fact Sheet for Patients: SugarRoll.be Fact Sheet for Healthcare Providers: https://www.woods-mathews.com/ This test is not yet approved or cleared by the [place] FDA and  has been authorized for detection and/or diagnosis of SARS-CoV-2 by FDA under an Emergency Use Authorization (EUA). This EUA will remain  in effect (meaning this test can be used) for the duration of the COVID-19 declaration under Section 56 4(b)(1) of the Act, 21 U.S.C. section 360bbb-3(b)(1), unless the authorization is terminated or revoked sooner. Performed at [facility], [place] [address]  [place], [place] [address]       Radiology Studies: No results found.   Scheduled Meds: . sodium chloride   Intravenous Once  . docusate sodium  100 mg Oral Daily  . feeding supplement  1 Container Oral TID BM  . lisinopril  20 mg Oral Daily  . mometasone-formoterol  2 puff Inhalation BID  . montelukast  10 mg Oral QHS  . multivitamin with minerals  1 tablet Oral Daily  . pantoprazole  40 mg Oral BID  . sertraline  50 mg Oral Daily  . simvastatin  20 mg Oral q1800  . sucralfate  1 g Oral TID AC & HS  . umeclidinium bromide  1 puff Inhalation Daily   Continuous Infusions: . sodium chloride Stopped (01/14/19 1048)  . 0.9 % NaCl with KCl 20 mEq / L 100 mL/hr at 01/16/19 1830     LOS: 4 days   Time Spent in minutes   34 minutes  Rankin Coolman J [place] D.O. on 01/17/2019 at 10:14 AM [facility]

## 2019-01-18 LAB — CBC
HCT: 26.4 % — ABNORMAL LOW (ref 36.0–46.0)
Hemoglobin: 8.4 g/dL — ABNORMAL LOW (ref 12.0–15.0)
MCH: 29.7 pg (ref 26.0–34.0)
MCHC: 31.8 g/dL (ref 30.0–36.0)
MCV: 93.3 fL (ref 80.0–100.0)
Platelets: 230 10*3/uL (ref 150–400)
RBC: 2.83 MIL/uL — ABNORMAL LOW (ref 3.87–5.11)
RDW: 16.1 % — ABNORMAL HIGH (ref 11.5–15.5)
WBC: 8.8 10*3/uL (ref 4.0–10.5)
nRBC: 0 % (ref 0.0–0.2)

## 2019-01-18 LAB — BASIC METABOLIC PANEL
Anion gap: 8 (ref 5–15)
BUN: <5 mg/dL — ABNORMAL LOW (ref 8–23)
CO2: 28 mmol/L (ref 22–32)
Calcium: 7.9 mg/dL — ABNORMAL LOW (ref 8.9–10.3)
Chloride: 104 mmol/L (ref 98–111)
Creatinine, Ser: 0.54 mg/dL (ref 0.44–1.00)
GFR calc Af Amer: >60 mL/min (ref >60)
GFR calc non Af Amer: >60 mL/min (ref >60)
Glucose, Bld: 86 mg/dL (ref 70–99)
Potassium: 3.6 mmol/L (ref 3.5–5.1)
Sodium: 140 mmol/L (ref 135–145)

## 2019-01-18 LAB — BPAM RBC
Blood Product Expiration Date: 202101162359
ISSUE DATE / TIME: 202012171149
Unit Type and Rh: 6200

## 2019-01-18 LAB — TYPE AND SCREEN
ABO/RH(D): A POS
Antibody Screen: NEGATIVE
Unit division: 0

## 2019-01-18 MED ORDER — SUCRALFATE 1 GM/10ML PO SUSP: 1.0000 g | Freq: Three times a day (TID) | ORAL | 0 refills | Status: DC

## 2019-01-18 MED ORDER — PANTOPRAZOLE SODIUM 40 MG PO TBEC: 40.0000 mg | DELAYED_RELEASE_TABLET | Freq: Two times a day (BID) | ORAL | 0 refills | Status: DC

## 2019-01-18 NOTE — Discharge Instructions (Signed)
Gastritis, Adult  Gastritis is swelling (inflammation) of the stomach. Gastritis can develop quickly (acute). It can also develop slowly over time (chronic). It is important to get help for this condition. If you do not get help, your stomach can bleed, and you can get sores (ulcers) in your stomach. What are the causes? This condition may be caused by:  Germs that get to your stomach.  Drinking too much alcohol.  Medicines you are taking.  Too much acid in the stomach.  A disease of the intestines or stomach.  Stress.  An allergic reaction.  Crohn's disease.  Some cancer treatments (radiation). Sometimes the cause of this condition is not known. What are the signs or symptoms? Symptoms of this condition include:  Pain in your stomach.  A burning feeling in your stomach.  Feeling sick to your stomach (nauseous).  Throwing up (vomiting).  Feeling too full after you eat.  Weight loss.  Bad breath.  Throwing up blood.  Blood in your poop (stool). How is this diagnosed? This condition may be diagnosed with:  Your medical history and symptoms.  A physical exam.  Tests. These can include: ? Blood tests. ? Stool tests. ? A procedure to look inside your stomach (upper endoscopy). ? A test in which a sample of tissue is taken for testing (biopsy). How is this treated? Treatment for this condition depends on what caused it. You may be given:  Antibiotic medicine, if your condition was caused by germs.  H2 blockers and similar medicines, if your condition was caused by too much acid. Follow these instructions at home: Medicines  Take over-the-counter and prescription medicines only as told by your doctor.  If you were prescribed an antibiotic medicine, take it as told by your doctor. Do not stop taking it even if you start to feel better. Eating and drinking   Eat small meals often, instead of large meals.  Avoid foods and drinks that make your symptoms  worse.  Drink enough fluid to keep your pee (urine) pale yellow. Alcohol use  Do not drink alcohol if: ? Your doctor tells you not to drink. ? You are pregnant, may be pregnant, or are planning to become pregnant.  If you drink alcohol: ? Limit your use to:  0-1 drink a day for women.  0-2 drinks a day for men. ? Be aware of how much alcohol is in your drink. In the [place] one drink equals one 12 oz bottle of beer (355 mL), one 5 oz glass of wine (148 mL), or one 1 oz glass of hard liquor (44 mL). General instructions  Talk with your doctor about ways to manage stress. You can exercise or do deep breathing, meditation, or yoga.  Do not smoke or use products that have nicotine or tobacco. If you need help quitting, ask your doctor.  Keep all follow-up visits as told by your doctor. This is important. Contact a doctor if:  Your symptoms get worse.  Your symptoms go away and then come back. Get help right away if:  You throw up blood or something that looks like coffee grounds.  You have black or dark red poop.  You throw up any time you try to drink fluids.  Your stomach pain gets worse.  You have a fever.  You do not feel better after one week. Summary  Gastritis is swelling (inflammation) of the stomach.  You must get help for this condition. If you do not get help, your stomach   can bleed, and you can get sores (ulcers).  This condition is diagnosed with medical history, physical exam, or tests.  You can be treated with medicines for germs or medicines to block too much acid in your stomach. This information is not intended to replace advice given to you by your health care provider. Make sure you discuss any questions you have with your health care provider. Document Released: 07/06/2007 Document Revised: 06/06/2017 Document Reviewed: 06/06/2017 Elsevier Patient Education  [place]. Esophagitis  Esophagitis is inflammation of the esophagus. The  esophagus is the tube that carries food from your mouth to your stomach. Esophagitis can cause soreness or pain in the esophagus. This condition can make it difficult and painful to swallow. What are the causes? Most causes of esophagitis are not serious. Common causes of this condition include:  Gastroesophageal reflux disease (GERD). This is when stomach contents move back up into the esophagus (reflux).  Repeated vomiting.  An allergic reaction, especially caused by food allergies (eosinophilic esophagitis).  Injury to the esophagus by swallowing large pills with or without water, or swallowing certain types of medicines.  Swallowing (ingesting) harmful chemicals, such as household cleaning products.  Heavy alcohol use.  An infection of the esophagus. This most often occurs in people who have a weakened immune system.  Radiation or chemotherapy treatment for cancer.  Certain diseases such as sarcoidosis, Crohn's disease, and scleroderma. What are the signs or symptoms? Symptoms of this condition include:  Difficult or painful swallowing.  Pain with swallowing acidic liquids, such as citrus juices.  Pain with burping.  Chest pain.  Difficulty breathing.  Nausea.  Vomiting.  Pain in the abdomen.  Weight loss.  Ulcers in the mouth.  Patches of white material in the mouth (candidiasis).  Fever.  Coughing up blood or vomiting blood.  Stool that is black, tarry, or bright red. How is this diagnosed? Your health care provider will take a medical history and perform a physical exam. You may also have other tests, including:  An endoscopy to examine your esophagus and stomach with a small flexible tube with a camera.  A test that measures the acidity level in your esophagus.  A test that measures how much pressure is on your esophagus.  A barium swallow or modified barium swallow to show the shape, size, and functioning of your esophagus.  Allergy tests. How  is this treated? Treatment for this condition depends on the cause of your esophagitis. In some cases, steroids or other medicines may be given to help relieve your symptoms or to treat the underlying cause of your condition. You may have to make some lifestyle changes, such as:  Avoiding alcohol.  Quitting smoking.  Changing your diet.  Exercising.  Changing your sleep habits and your sleep environment. Follow these instructions at home: Medicines  Take over-the-counter and prescription medicines only as told by your health care provider.  Do not take aspirin, ibuprofen, or other NSAIDs unless your health care provider told you to do so.  If you have trouble taking pills: ? Use a pill splitter to decrease the size of the pill. This will decrease the chance of the pill getting stuck or injuring your esophagus. ? Drink water after you take a pill. Eating and drinking   Avoid foods and drinks that seem to make your symptoms worse.  Follow a diet as recommended by your health care provider. This may involve avoiding foods and drinks such as: ? Coffee and tea (with  or without caffeine). ? Drinks that contain alcohol. ? Energy drinks and sports drinks. ? Carbonated drinks or sodas. ? Chocolate and cocoa. ? Peppermint and mint flavorings. ? Garlic and onions. ? Horseradish. ? Spicy and acidic foods, including peppers, chili powder, curry powder, vinegar, hot sauces, and barbecue sauce. ? Citrus fruit juices and citrus fruits, such as oranges, lemons, and limes. ? Tomato-based foods, such as red sauce, chili, salsa, and pizza with red sauce. ? Fried and fatty foods, such as donuts, french fries, potato chips, and high-fat dressings. ? High-fat meats, such as hot dogs and fatty cuts of red and white meats, such as rib eye steak, sausage, ham, and bacon. ? High-fat dairy items, such as whole milk, butter, and cream cheese. Lifestyle  Eat small, frequent meals instead of large  meals.  Avoid drinking large amounts of liquid with your meals.  Avoid eating meals during the 2-3 hours before bedtime.  Avoid lying down right after you eat.  Do not exercise right after you eat.  Do not use any products that contain nicotine or tobacco, such as cigarettes and e-cigarettes. If you need help quitting, ask your health care provider. General instructions   Pay attention to any changes in your symptoms. Let your health care provider know about them.  Wear loose-fitting clothing. Do not wear anything tight around your waist that causes pressure on your abdomen.  Raise (elevate) the head of your bed about 6 inches (15 cm).  Try relaxation strategies such as yoga, deep breathing, or meditation to manage stress. If you need help reducing stress, ask your health care provider.  If you are overweight, reduce your weight to an amount that is healthy for you. Ask your health care provider for guidance about a safe weight loss goal.  Keep all follow-up visits as told by your health care provider. This is important. Contact a health care provider if:  You have new symptoms.  You have unexplained weight loss.  You have difficulty swallowing, or it hurts to swallow.  You have wheezing or a cough that does not go away.  Your symptoms do not improve with treatment.  You have frequent heartburn for more than two weeks. Get help right away if:  You have severe pain in your arms, neck, jaw, teeth, or back.  You feel sweaty, dizzy, or light-headed.  You have chest pain or shortness of breath.  You vomit and your vomit looks like blood or coffee grounds.  Your stool is bloody or black.  You have a fever.  You cannot swallow, drink, or eat. Summary  Esophagitis is inflammation of the esophagus.  Most causes of esophagitis are not serious.  Follow your health care provider's instructions about eating and drinking. Follow instructions on medicines.  Contact a  health care provider if you have new symptoms, have weight loss, or coughing that does not stop.  Get help right away if you have severe pain in the arms, neck, jaw, teeth or back, or if you have chest pain, shortness of breath, or fever. This information is not intended to replace advice given to you by your health care provider. Make sure you discuss any questions you have with your health care provider. Document Released: 02/25/2004 Document Revised: 09/08/2017 Document Reviewed: 09/08/2017 Elsevier Patient Education  2020 [place]. Peptic Ulcer  A peptic ulcer is a painful sore in the lining of your stomach or the first part of your small intestine. What are the causes? Common causes  of this condition include:  An infection.  Using certain pain medicines too often or too much. What increases the risk? You are more likely to get this condition if you:  Smoke.  Have a family history of ulcer disease.  Drink alcohol.  Have been hospitalized in an intensive care unit (ICU). What are the signs or symptoms? Symptoms include:  Burning pain in the area between the chest and the belly button. The pain may: ? Not go away (be persistent). ? Be worse when your stomach is empty. ? Be worse at night.  Heartburn.  Feeling sick to your stomach (nauseous) and throwing up (vomiting).  Bloating. If the ulcer results in bleeding, it can cause you to:  Have poop (stool) that is black and looks like tar.  Throw up bright red blood.  Throw up material that looks like coffee grounds. How is this treated? Treatment for this condition may include:  Stopping things that can cause the ulcer, such as: ? Smoking. ? Using pain medicines.  Medicines to reduce stomach acid.  Antibiotic medicines if the ulcer is caused by an infection.  A procedure that is done using a small, flexible tube that has a camera at the end (upper endoscopy). This may be done if you have a bleeding  ulcer.  Surgery. This may be needed if: ? You have a lot of bleeding. ? The ulcer caused a hole somewhere in the digestive system. Follow these instructions at home:  Do not drink alcohol if your doctor tells you not to drink.  Limit how much caffeine you take in.  Do not use any products that contain nicotine or tobacco, such as cigarettes, e-cigarettes, and chewing tobacco. If you need help quitting, ask your doctor.  Take over-the-counter and prescription medicines only as told by your doctor. ? Do not stop or change your medicines unless you talk with your doctor about it first. ? Do not take aspirin, ibuprofen, or other NSAIDs unless your doctor told you to do so.  Keep all follow-up visits as told by your doctor. This is important. Contact a doctor if:  You do not get better in 7 days after you start treatment.  You keep having an upset stomach (indigestion) or heartburn. Get help right away if:  You have sudden, sharp pain in your belly (abdomen).  You have belly pain that does not go away.  You have bloody poop (stool) or black, tarry poop.  You throw up blood. It may look like coffee grounds.  You feel light-headed or feel like you may pass out (faint).  You get weak.  You get sweaty or feel sticky and cold to the touch (clammy). Summary  Symptoms of a peptic ulcer include burning pain in the area between the chest and the belly button.  Take medicines only as told by your doctor.  Limit how much alcohol and caffeine you have.  Keep all follow-up visits as told by your doctor. This information is not intended to replace advice given to you by your health care provider. Make sure you discuss any questions you have with your health care provider. Document Released: 04/13/2009 Document Revised: 07/25/2017 Document Reviewed: 07/25/2017 Elsevier Patient Education  2020 [employer].

## 2019-01-18 NOTE — Progress Notes (Signed)
Discharge AVS meds take and those due reviewed with pt. Follow up appointments and when to call MD reviewed. All questions and concerns addressed. No further questions at this time. D/c IV and TELE, CCMD notified. D/C home per orders. Pt home O2 tank not working properly. CM notified, [facility] to bring new tank to bedside prior to d/c.

## 2019-01-18 NOTE — Discharge Summary (Signed)
Physician Discharge Summary  Alicia Holder [id number] DOB: 1950/07/07 DOA: 01/13/2019  PCP: Ellamae Sia, MD  Admit date: 01/13/2019 Discharge date: 01/18/2019  Admitted From:  Disposition:    Recommendations for Outpatient Follow-up:  1. Follow up with PCP in 1-2 weeks 2. Follow-up with gastroenterology, Dr. Allen Norris in 3 weeks; will need repeat EGD 2 months to check for healing per GI 3. Discharged home on Protonix 40 mg p.o. twice daily x8 weeks and Carafate 1 g p.o. QID x 2 weeks per GI recommendations 4. Please obtain BMP/CBC in one week 5. Recommend continued surveillance of ascending aorta aneurysm annually 6. Ovarian cystic lesion, 1.6 cm.  Recommend outpatient pelvic ultrasound.   Home Health: No Equipment/Devices: None  Discharge Condition: Stable CODE STATUS: DNR Diet recommendation: Soft diet  History of present illness:  This is a 68 year old female was admitted at [facility] with GI bleed. She has severe anemiawith initial hemoglobin is 5.9. She also had right upper quadrant pain. Since admission shehas been transfused a total of4 units packed red blood cells.At [facility] continuedto have large bloody stools and dropsin her blood pressure. She was resuscitated with IV fluids and PRBC. Patient was evaluated by GI who recommended urgent EGD given hemodynamic instability.She underwent a EGDyesterdaywhich showedred blood cells in the distal esophagus and blood clot in the gastric fundus but no definite source of her bleeding.As patient is bleeding significantly withno source beingfound, she was transferred to [facility]. The patient was discussed with IR for vascular interventionDr. Wagneraware of the transfer.  Patient's right upper quadrant pain has improved.HerLFTs and bilirubin normal .Ultrasound right upper quadrant shows cholelithiasis without evidence ofcholecystitis.CT abdomen pelvis also shows cholelithiasisalsowithout  cholecystitis.  Last week she got SOB and a chest cold. She was hospitalized and discharged last Friday.  Friday she threw up blood, a lot. She got to where she couldn't walk. Her stool was a bit bloody. She hadabdominal pain greater inherepigastric region andand rightside. She has ah/o ahernia and GERD. She has no h/ostomachulcers or GI bleed.She seldom eats spicy food. She does not use NSAIDs for pain except her daily low dose ASA. She had a precancerous polp removed in 2012. Her last colonoscopy was 4 years ago. She is due for a repeat colonoscopy at 5 years. Currently she hada bit of melanotic stool yesteray. She states she has not vomited blood in 24 hours.  History provided by the patient who isweak butalert and oriented.  Hospital course:  Acute GI bleed/acute blood loss anemia 2/2 gastritis, esophagitis, esophageal ulcer Patient presenting as a transfer from [facility] for concern of acute GI bleed.  Hemoglobin noted to be 5.3 on 01/11/2019.  She was started on IV Protonix drip. Patient status post EGD on 12/12 at [facility] which showed large amount of red blood in the distal esophagus and gastric fundus with source not pinpointed.  Patient did have hemostatic spray in both areas with no blood noted afterward.  Due to continued bleeding, patient was transferred to [facility] for possible IR intervention.  Patient had a repeat EGD by GI, Dr. Therisa Doyne on 01/14/2019 with findings of severe esophagitis with esophageal ulceration with placement of a hemostatic clip. Iron studies with adequate iron but B12 173, B12 supplemental given while inpatient. Transfused total 6 units PRBCs on 01/12/2019; and 1 additional unit on 01/17/2019.  Tolerating diet.  Continue Protonix 40 mg p.o. twice daily x8 weeks and sulcralfate 1g PO QID x 2 weeks per GI recommendation.  Hemoglobin at time of discharge 8.4.  Recommend repeat CBC in 1 week.  Outpatient follow-up with her primary  gastroenterologist, Dr. Allen Norris.  Leukocytosis with lactic acidosis: Resolved Suspect reactive, WBC count improving and has now WNL; lactic acidosis resolved. Blood cultures [facility] show no growth to date and UA and chest x-ray showed no infection  Hypokalemia/hypomagnesemia Repleted during hospitalization and resolved.  RUQ TTP No clear etiology, no evidence of ascending cholangitis, LFTs are normal, CT and right upper quadrant ultrasound unremarkable.  Resolved.  Secondary polycythemia Patient follows with hematology oncology, Dr. Susy Manor, for monthly CBC and phlebotomy  Essential hypertension Resume home medications  Hyperlipidemia Resume home statin  COPD with chronic hypoxic respiratory failure Continues on 3 L nasal cannula, which is her home baseline.  Continue home inhalers.  Ovarian cystic structure Noted to have 1.6 cm left ovarian cystic structure. 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 Recommend annual imaging followup by CTA or MR  Discharge Diagnoses:  Active Problems:   HTN (hypertension)   Hyperlipidemia   Tobacco abuse counseling   GI bleed   Acute posthemorrhagic anemia   COPD (chronic obstructive pulmonary disease) (HCC)   H/O colon cancer, stage II   Polycythemia vera ([place])    Discharge Instructions  Discharge Instructions    Call MD for:  difficulty breathing, headache or visual disturbances   Complete by: As directed    Call MD for:  extreme fatigue   Complete by: As directed    Call MD for:  persistant dizziness or light-headedness   Complete by: As directed    Call MD for:  persistant nausea and vomiting   Complete by: As directed    Call MD for:  severe uncontrolled pain   Complete by: As directed    Call MD for:  temperature >100.4   Complete by: As directed    Diet - low sodium heart healthy   Complete by: As directed    Increase activity slowly    Complete by: As directed      Allergies as of 01/18/2019   No Known Allergies     Medication List    TAKE these medications   acetaminophen 500 MG tablet Commonly known as: TYLENOL Take 1,000 mg by mouth every 6 (six) hours as needed for mild pain.   Advair Diskus 250-50 MCG/DOSE Aepb Generic drug: Fluticasone-Salmeterol Inhale 1 puff into the lungs 2 (two) times daily.   albuterol 108 (90 Base) MCG/ACT inhaler Commonly known as: VENTOLIN HFA Inhale 1-2 puffs into the lungs every 4 (four) hours as needed for wheezing or shortness of breath.   alendronate 70 MG tablet Commonly known as: FOSAMAX Take 70 mg by mouth once a week.   ALPRAZolam 0.25 MG tablet Commonly known as: XANAX Take 1 tablet (0.25 mg total) by mouth 2 (two) times daily as needed for anxiety.   docusate sodium 100 MG capsule Commonly known as: COLACE Take 100 mg by mouth daily.   ipratropium-albuterol 0.5-2.5 (3) MG/3ML Soln Commonly known as: DUONEB Take 3 mLs by nebulization every 4 (four) hours as needed.   lisinopril 20 MG tablet Commonly known as: ZESTRIL Take 20 mg by mouth daily.   montelukast 10 MG tablet Commonly known as: SINGULAIR Take 10 mg by mouth at bedtime.   oxyCODONE 5 MG immediate release tablet Commonly known as: Oxy IR/ROXICODONE Take 5 mg by mouth 3 (three) times daily as needed  for moderate pain or breakthrough pain.   pantoprazole 40 MG tablet Commonly known as: PROTONIX Take 1 tablet (40 mg total) by mouth 2 (two) times daily.   sertraline 50 MG tablet Commonly known as: ZOLOFT Take 50 mg by mouth daily.   simvastatin 20 MG tablet Commonly known as: ZOCOR Take 20 mg by mouth daily.   sucralfate 1 GM/10ML suspension Commonly known as: CARAFATE Take 10 mLs (1 g total) by mouth 4 (four) times daily -  before meals and at bedtime for 14 days.   tiotropium 18 MCG inhalation capsule Commonly known as: SPIRIVA Place 1 capsule (18 mcg total) into inhaler and inhale  daily.      Follow-up Information    Burns, Harriett P, MD. Schedule an appointment as soon as possible for a visit in 1 week(s).   Specialty: Internal Medicine Contact information: [place] [address] [phone]        Lucilla Lame, MD. Schedule an appointment as soon as possible for a visit in 3 week(s).   Specialty: Gastroenterology Contact information: [place] [address] [phone]          No Known Allergies  Consultations:  [facility] - Dr. Therisa Doyne   Procedures/Studies: CT Head Wo Contrast  Result Date: 01/11/2019 CLINICAL DATA:  Weakness, fall EXAM: CT HEAD WITHOUT CONTRAST TECHNIQUE: Contiguous axial images were obtained from the base of the skull through the vertex without intravenous contrast. COMPARISON:  None. FINDINGS: Brain: No evidence of acute infarction, hemorrhage, hydrocephalus, extra-axial collection or mass lesion/mass effect. Minimal scattered low-density changes within the periventricular and subcortical white matter suggesting sequela of chronic microvascular ischemic disease. Vascular: No hyperdense vessel or unexpected calcification. Skull: Normal. Negative for fracture or focal lesion. Sinuses/Orbits: Mucosal thickening within the left sphenoid sinus. Orbital structures appear unremarkable. Other: None. IMPRESSION: 1. No acute intracranial findings. 2. Left sphenoid sinus disease. Electronically Signed   By: Davina Poke M.D.   On: 01/11/2019 23:01   CT Chest Wo Contrast  Result Date: 01/11/2019 CLINICAL DATA:  Weakness and fall with history of abdominal aortic aneurysm EXAM: CT CHEST WITHOUT CONTRAST TECHNIQUE: Multidetector CT imaging of the chest was performed following the standard protocol without IV contrast. COMPARISON:  01/01/2019 FINDINGS: Cardiovascular: Somewhat limited due to lack of IV contrast. Atherosclerotic calcifications of the thoracic aorta are noted. Stable dilatation of the  ascending aorta is noted to 4.2 cm. Coronary calcifications are seen. No cardiac enlargement is noted. Mediastinum/Nodes: Thoracic inlet demonstrates a large multinodular goiter with extension into the superior mediastinum from the left lobe stable from the prior exam. Chronic hiatal hernia is seen. No hilar or mediastinal adenopathy is noted. Lungs/Pleura: The lungs are well aerated bilaterally. No focal infiltrate or sizable effusion is seen. No parenchymal nodules are noted. Chronic scarring in the lingula is seen. Upper Abdomen: Recently reported on CT of the abdomen and pelvis. Musculoskeletal: Degenerative changes of the thoracic spine are seen. Old rib fractures are noted anteriorly stable from the prior exam. Stable thoracic kyphosis is noted. No acute compression deformity is seen. 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 Recommend annual imaging followup by CTA or MRA. This recommendation follows 2010 ACCF/AHA/AATS/ACR/ASA/SCA/SCAI/SIR/STS/SVM Guidelines for the Diagnosis and Management of Patients with Thoracic Aortic Disease. Circulation. 2010; [id number]. Aortic aneurysm NOS (ICD10-I71.9) Large multinodular goiter stable from the prior exam. Hiatal hernia stable from the previous exam. Aortic Atherosclerosis (ICD10-I70.0). Electronically Signed   By: Elta Guadeloupe  Lukens M.D.   On: 01/11/2019 22:54   CT Angio Chest PE W and/or Wo Contrast  Result Date: 01/01/2019 CLINICAL DATA:  Chest pain and shortness of breath. The pain radiates to the back. Aneurysmal dilatation of the ascending thoracic aorta. Positive D-dimer. EXAM: CT ANGIOGRAPHY CHEST WITH CONTRAST TECHNIQUE: Multidetector CT imaging of the chest was performed using the standard protocol during bolus administration of intravenous contrast. Multiplanar CT image reconstructions and MIPs were obtained to evaluate the vascular anatomy. CONTRAST:  65mL OMNIPAQUE IOHEXOL 350 MG/ML SOLN COMPARISON:  CT scan dated 03/20/2018  FINDINGS: Cardiovascular: Aortic atherosclerosis. The ascending thoracic aorta is stable at 4.2 cm. Heart size is normal. Aberrant right subclavian artery. No aortic dissection. No pulmonary emboli. Mediastinum/Nodes: Stable multinodular goiter. Large chronic hiatal hernia, more prominent than on the prior study. Lungs/Pleura: Lungs are clear. No pleural effusion or pneumothorax. Upper Abdomen: No acute abnormality. Musculoskeletal: No chest wall abnormality. No acute or significant osseous findings. Chronic accentuation of the thoracic kyphosis. Review of the MIP images confirms the above findings. IMPRESSION: 1. No acute abnormalities. Specifically, no pulmonary emboli. 2. 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 3. Stable multinodular goiter. 4. Large chronic hiatal hernia, more prominent than on the prior study. 5. Aortic Atherosclerosis (ICD10-I70.0). Electronically Signed   By: Lorriane Shire M.D.   On: 01/01/2019 20:20   CT ABDOMEN PELVIS W CONTRAST  Result Date: 01/11/2019 CLINICAL DATA:  Acute abdominal pain. Intermittent right upper quadrant abdominal pain. EXAM: CT ABDOMEN AND PELVIS WITH CONTRAST TECHNIQUE: Multidetector CT imaging of the abdomen and pelvis was performed using the standard protocol following bolus administration of intravenous contrast. CONTRAST:  132mL OMNIPAQUE IOHEXOL 300 MG/ML  SOLN COMPARISON:  None. FINDINGS: Lower chest: The lung bases are clear. The heart size is normal. Hepatobiliary: The liver is normal. Cholelithiasis without acute inflammation.There is no biliary ductal dilation. Pancreas: Normal contours without ductal dilatation. No peripancreatic fluid collection. Spleen: No splenic laceration or hematoma. Adrenals/Urinary Tract: --Adrenal glands: No adrenal hemorrhage. --Right kidney/ureter: No hydronephrosis or perinephric hematoma. --Left kidney/ureter: No hydronephrosis or perinephric hematoma. --Urinary bladder: Unremarkable.  Stomach/Bowel: --Stomach/Duodenum: There is a large hiatal hernia. --Small bowel: No dilatation or inflammation. --Colon: There is a ventral wall hernia containing a loop of the transverse colon without evidence for an obstruction. There are postsurgical changes of the right hemicolon without evidence for obstruction. --Appendix: Likely surgically absent. Vascular/Lymphatic: Atherosclerotic calcification is present within the non-aneurysmal abdominal aorta, without hemodynamically significant stenosis. --No retroperitoneal lymphadenopathy. --there are some enlarged paraesophageal and gastrohepatic ligament lymph nodes --No pelvic or inguinal lymphadenopathy. Reproductive: There is a 1.6 cm left ovarian cystic structure. The patient is status post prior hysterectomy. Other: No ascites or free air. The abdominal wall is normal. Musculoskeletal. There is age-indeterminate height loss of L3 vertebral body, presumably chronic. IMPRESSION: 1. No acute abdominopelvic process. 2. Ventral wall hernia containing a loop of transverse colon without evidence for obstruction. 3. Large hiatal hernia. 4. Cholelithiasis without acute inflammation. 5. A 1.6 cm left ovarian cystic structure. Further evaluation with a nonemergent outpatient pelvic ultrasound is recommended in 6 months. 6. Age-indeterminate L3 vertebral body height loss, presumably chronic. 7. Mildly enlarged paraesophageal lymph nodes, presumably reactive. Aortic Atherosclerosis (ICD10-I70.0). Electronically Signed   By: Constance Holster M.D.   On: 01/11/2019 22:54   US Venous Img Lower Bilateral  Result Date: 01/01/2019 CLINICAL DATA:  Shortness of breath, elevated D-dimer. EXAM: BILATERAL LOWER EXTREMITY VENOUS DOPPLER ULTRASOUND TECHNIQUE: Gray-scale sonography with graded  compression, as well as color Doppler and duplex ultrasound were performed to evaluate the lower extremity deep venous systems from the level of the common femoral vein and including the  common femoral, femoral, profunda femoral, popliteal and calf veins including the posterior tibial, peroneal and gastrocnemius veins when visible. The superficial great saphenous vein was also interrogated. Spectral Doppler was utilized to evaluate flow at rest and with distal augmentation maneuvers in the common femoral, femoral and popliteal veins. COMPARISON:  None. FINDINGS: RIGHT LOWER EXTREMITY Common Femoral Vein: No evidence of thrombus. Normal compressibility, respiratory phasicity and response to augmentation. Saphenofemoral Junction: No evidence of thrombus. Normal compressibility and flow on color Doppler imaging. Profunda Femoral Vein: No evidence of thrombus. Normal compressibility and flow on color Doppler imaging. Femoral Vein: No evidence of thrombus. Normal compressibility, respiratory phasicity and response to augmentation. Popliteal Vein: No evidence of thrombus. Normal compressibility, respiratory phasicity and response to augmentation. Calf Veins: Limited visualization, unremarkable. Superficial Great Saphenous Vein: No evidence of thrombus. Normal compressibility. Venous Reflux:  None. Other Findings:  None. LEFT LOWER EXTREMITY Common Femoral Vein: No evidence of thrombus. Normal compressibility, respiratory phasicity and response to augmentation. Saphenofemoral Junction: No evidence of thrombus. Normal compressibility and flow on color Doppler imaging. Profunda Femoral Vein: No evidence of thrombus. Normal compressibility and flow on color Doppler imaging. Femoral Vein: No evidence of thrombus. Normal compressibility, respiratory phasicity and response to augmentation. Popliteal Vein: No evidence of thrombus. Normal compressibility, respiratory phasicity and response to augmentation. Calf Veins: No evidence of thrombus. Normal compressibility and flow on color Doppler imaging. Superficial Great Saphenous Vein: No evidence of thrombus. Normal compressibility. Venous Reflux:  None. Other  Findings:  None. IMPRESSION: No evidence of deep venous thrombosis in either lower extremity. Electronically Signed   By: Rolm Baptise M.D.   On: 01/01/2019 22:39   DG Chest Port 1 View  Result Date: 01/11/2019 CLINICAL DATA:  Syncopal episode. Sepsis. EXAM: PORTABLE CHEST 1 VIEW COMPARISON:  January 01, 2019 FINDINGS: The heart size is stable from prior study. Aortic calcifications are noted. The thoracic aorta is tortuous. Hiatal hernia is again noted. There may be trace bilateral pleural effusions. There is an old fracture of the proximal left humerus. There are multiple old healed left-sided rib fractures. There is no pneumothorax. No large pleural effusion. IMPRESSION: 1. Possible trace bilateral pleural effusions. 2. Hiatal hernia. 3. No acute cardiopulmonary process. 4. Stable old left-sided rib fractures. Old left proximal humerus fracture. Electronically Signed   By: Constance Holster M.D.   On: 01/11/2019 19:54   DG Chest Port 1 View  Result Date: 01/01/2019 CLINICAL DATA:  Chest pain and shortness of breath. Symptoms for 1 week, worse yesterday. EXAM: PORTABLE CHEST 1 VIEW COMPARISON:  Chest CT 03/20/2018. most recent radiograph 04/08/2010. FINDINGS: Borderline cardiomegaly. Aortic atherosclerosis and tortuosity, similar to prior exams. Retrocardiac hiatal hernia. Diffuse bronchial and interstitial thickening, likely smoking related. Streaky bibasilar atelectasis. No confluent airspace disease. No pleural effusion or pneumothorax. Remote left rib fractures. Bones are under mineralized. IMPRESSION: 1. Borderline cardiomegaly. 2. Aortic atherosclerosis and tortuosity, similar to prior exams. 3. Diffuse bronchial and interstitial thickening, likely smoking related. Streaky bibasilar atelectasis. Aortic Atherosclerosis (ICD10-I70.0). Electronically Signed   By: Keith Rake M.D.   On: 01/01/2019 18:42   US Abdomen Limited RUQ  Result Date: 01/11/2019 CLINICAL DATA:  Right upper quadrant  pain and jaundice EXAM: ULTRASOUND ABDOMEN LIMITED RIGHT UPPER QUADRANT COMPARISON:  None. FINDINGS: Gallbladder: Gallbladder is well distended with evidence  of gallstone within. No wall thickening or pericholecystic fluid is noted. Phrygian cap is seen with a large stone. Common bile duct: Diameter: 5 mm Liver: No focal lesion identified. Within normal limits in parenchymal echogenicity. Portal vein is patent on color Doppler imaging with normal direction of blood flow towards the liver. Other: None. IMPRESSION: Cholelithiasis without complicating factors. A Phrygian cap is seen with a stone noted within. Electronically Signed   By: Inez Catalina M.D.   On: 01/11/2019 20:39     EGD 01/14/2019: Impression - LA Grade D esophagitis with bleeding. - Esophageal ulcers with stigmata of recent bleeding. Clip (MR conditional) was placed. - 5 cm hiatal hernia. - Erythematous mucosa in the cardia, gastric fundus, gastric body, incisura, antrum, prepyloric region of the stomach and pylorus. - Normal examined duodenum. - No specimens collected. Recommendations: - Clear liquid diet. - Give Protonix (pantoprazole): 8 mg/hr IV by continuous infusion for 1 day. Thereafter, PPI BID for at least 2 months. - Use sucralfate suspension 1 gram PO QID for 2 weeks. - H pylori serology, treat if found. - Avoid ASA and NSAIDs. - Repeat upper endoscopy in 2 months to check healing.  Subjective: Patient seen and examined bedside, resting comfortably.  No complaints this morning.  Ready for discharge home.  Hemoglobin up to 8.4.  Denies headache, no dizziness, no chest pain, no palpitations, no nausea/vomiting/diarrhea, no abdominal pain, no weakness, no fatigue, no paresthesias.  No acute events overnight per nursing staff.   Discharge Exam: Vitals:   01/18/19 0613 01/18/19 0754  BP: 102/69   Pulse: 76   Resp: 20   Temp: 98.2 F (36.8 C)   SpO2: 97% 97%   Vitals:   01/17/19 1937 01/17/19 1959 01/18/19 0613  01/18/19 0754  BP:  113/80 102/69   Pulse:  69 76   Resp:  20 20   Temp:  98.2 F (36.8 C) 98.2 F (36.8 C)   TempSrc:  Oral Oral   SpO2: 100% 99% 97% 97%  Weight:      Height:        General: Pt is alert, awake, not in acute distress Cardiovascular: RRR, S1/S2 +, no rubs, no gallops Respiratory: CTA bilaterally, no wheezing, no rhonchi Abdominal: Soft, NT, ND, bowel sounds + Extremities: no edema, no cyanosis    The results of significant diagnostics from this hospitalization (including imaging, microbiology, ancillary and laboratory) are listed below for reference.     Microbiology: Recent Results (from the past 240 hour(s))  Culture, blood (Routine x 2)     Status: None   Collection Time: 01/11/19  7:23 PM   Specimen: BLOOD  Result Value Ref Range Status   Specimen Description BLOOD LEFT FOREARM  Final   Special Requests   Final    BOTTLES DRAWN AEROBIC AND ANAEROBIC Blood Culture results may not be optimal due to an excessive volume of blood received in culture bottles   Culture   Final    NO GROWTH 5 DAYS Performed at [facility], [address], [place], [place] [address]    Report Status 01/16/2019 FINAL  Final  Culture, blood (Routine x 2)     Status: None   Collection Time: 01/11/19  7:41 PM   Specimen: BLOOD  Result Value Ref Range Status   Specimen Description BLOOD LEFT FOREARM  Final   Special Requests   Final    BOTTLES DRAWN AEROBIC AND ANAEROBIC Blood Culture adequate volume   Culture   Final  NO GROWTH 5 DAYS Performed at [facility], [address] [place], [place] [address]    Report Status 01/16/2019 FINAL  Final  SARS CORONAVIRUS 2 (TAT 6-24 HRS) Nasopharyngeal Nasopharyngeal Swab     Status: None   Collection Time: 01/11/19  9:44 PM   Specimen: Nasopharyngeal Swab  Result Value Ref Range Status   SARS Coronavirus 2 NEGATIVE NEGATIVE Final    Comment: (NOTE) SARS-CoV-2 target nucleic acids are NOT DETECTED. The  SARS-CoV-2 RNA is generally detectable in upper and lower respiratory specimens during the acute phase of infection. Negative results do not preclude SARS-CoV-2 infection, do not rule out co-infections with other pathogens, and should not be used as the sole basis for treatment or other patient management decisions. Negative results must be combined with clinical observations, patient history, and epidemiological information. The expected result is Negative. Fact Sheet for Patients: SugarRoll.be Fact Sheet for Healthcare Providers: https://www.woods-mathews.com/ This test is not yet approved or cleared by the [place] FDA and  has been authorized for detection and/or diagnosis of SARS-CoV-2 by FDA under an Emergency Use Authorization (EUA). This EUA will remain  in effect (meaning this test can be used) for the duration of the COVID-19 declaration under Section 56 4(b)(1) of the Act, 21 U.S.C. section 360bbb-3(b)(1), unless the authorization is terminated or revoked sooner. Performed at [facility], [place] [address] [place], [place] [address]      Labs: BNP (last 3 results) No results for input(s): BNP in the last 8760 hours. Basic Metabolic Panel: Recent Labs  Lab 01/14/19 0335 01/15/19 0628 01/15/19 1112 01/16/19 0547 01/17/19 0607 01/18/19 0331  NA 137 138  --  138 139 140  K 3.2* 2.5*  --  2.8* 3.7 3.6  CL 110 107  --  104 106 104  CO2 21* 25  --  26 28 28   GLUCOSE 75 89  --  95 92 86  BUN 5* <5*  --  <5* <5* <5*  CREATININE 0.32* 0.38*  --  0.50 0.48 0.54  CALCIUM 6.7* 7.4*  --  7.4* 7.6* 7.9*  MG  --   --  1.4* 1.4* 1.8  --    Liver Function Tests: Recent Labs  Lab 01/11/19 1923 01/11/19 2317 01/12/19 0428 01/15/19 0628  AST 28 19 24  13*  ALT 19 17 16 10   ALKPHOS 36* 33* 28* 27*  BILITOT 0.2*  0.5 0.2* 0.4 0.1*  PROT 4.9* 4.8* 4.3* 3.7*  ALBUMIN 2.4* 2.3* 2.0* 1.7*   Recent Labs  Lab 01/11/19 1923   LIPASE 34   No results for input(s): AMMONIA in the last 168 hours. CBC: Recent Labs  Lab 01/11/19 1923 01/11/19 2317 01/12/19 2347 01/13/19 0245 01/14/19 0335 01/16/19 0547 01/16/19 1731 01/16/19 2344 01/17/19 0607 01/17/19 1746 01/18/19 0331  WBC 21.6* 18.3* 13.2* 12.1* 10.9* 9.6  --   --  9.4  --  8.8  NEUTROABS 16.0* 14.5* 9.9*  --   --   --   --   --   --   --   --   HGB 5.9* 5.3* 8.8* 8.3* 7.3* 7.4* 8.1* 7.6* 7.3* 9.2* 8.4*  HCT 19.0* 16.5* 25.7* 23.4* 21.9* 22.0* 25.0* 23.4* 22.6* 28.2* 26.4*  MCV 91.8 88.2 88.0 84.5 91.6 90.2  --   --  92.6  --  93.3  PLT 376 304 201 192 216 259  --   --  235  --  230   Cardiac Enzymes: No results  for input(s): CKTOTAL, CKMB, CKMBINDEX, TROPONINI in the last 168 hours. BNP: Invalid input(s): POCBNP CBG: No results for input(s): GLUCAP in the last 168 hours. D-Dimer No results for input(s): DDIMER in the last 72 hours. Hgb A1c No results for input(s): HGBA1C in the last 72 hours. Lipid Profile No results for input(s): CHOL, HDL, LDLCALC, TRIG, CHOLHDL, LDLDIRECT in the last 72 hours. Thyroid function studies No results for input(s): TSH, T4TOTAL, T3FREE, THYROIDAB in the last 72 hours.  Invalid input(s): FREET3 Anemia work up No results for input(s): VITAMINB12, FOLATE, FERRITIN, TIBC, IRON, RETICCTPCT in the last 72 hours. Urinalysis    Component Value Date/Time   COLORURINE YELLOW (A) 01/11/2019 2144   APPEARANCEUR CLEAR (A) 01/11/2019 2144   LABSPEC >1.046 (H) 01/11/2019 2144   PHURINE 6.0 01/11/2019 2144   GLUCOSEU NEGATIVE 01/11/2019 2144   HGBUR SMALL (A) 01/11/2019 2144   BILIRUBINUR NEGATIVE 01/11/2019 2144   KETONESUR 5 (A) 01/11/2019 2144   PROTEINUR NEGATIVE 01/11/2019 2144   NITRITE NEGATIVE 01/11/2019 2144   LEUKOCYTESUR NEGATIVE 01/11/2019 2144   Sepsis Labs Invalid input(s): PROCALCITONIN,  WBC,  LACTICIDVEN Microbiology Recent Results (from the past 240 hour(s))  Culture, blood (Routine x 2)      Status: None   Collection Time: 01/11/19  7:23 PM   Specimen: BLOOD  Result Value Ref Range Status   Specimen Description BLOOD LEFT FOREARM  Final   Special Requests   Final    BOTTLES DRAWN AEROBIC AND ANAEROBIC Blood Culture results may not be optimal due to an excessive volume of blood received in culture bottles   Culture   Final    NO GROWTH 5 DAYS Performed at [facility], [address] [place], [place] [address]    Report Status 01/16/2019 FINAL  Final  Culture, blood (Routine x 2)     Status: None   Collection Time: 01/11/19  7:41 PM   Specimen: BLOOD  Result Value Ref Range Status   Specimen Description BLOOD LEFT FOREARM  Final   Special Requests   Final    BOTTLES DRAWN AEROBIC AND ANAEROBIC Blood Culture adequate volume   Culture   Final    NO GROWTH 5 DAYS Performed at [facility], [address], [place], [place] [address]    Report Status 01/16/2019 FINAL  Final  SARS CORONAVIRUS 2 (TAT 6-24 HRS) Nasopharyngeal Nasopharyngeal Swab     Status: None   Collection Time: 01/11/19  9:44 PM   Specimen: Nasopharyngeal Swab  Result Value Ref Range Status   SARS Coronavirus 2 NEGATIVE NEGATIVE Final    Comment: (NOTE) SARS-CoV-2 target nucleic acids are NOT DETECTED. The SARS-CoV-2 RNA is generally detectable in upper and lower respiratory specimens during the acute phase of infection. Negative results do not preclude SARS-CoV-2 infection, do not rule out co-infections with other pathogens, and should not be used as the sole basis for treatment or other patient management decisions. Negative results must be combined with clinical observations, patient history, and epidemiological information. The expected result is Negative. Fact Sheet for Patients: SugarRoll[employer]be Fact Sheet for Healthcare Providers: https://www.woods-mathews.com/ This test is not yet approved or cleared by the [place] FDA and  has  been authorized for detection and/or diagnosis of SARS-CoV-2 by FDA under an Emergency Use Authorization (EUA). This EUA will remain  in effect (meaning this test can be used) for the duration of the COVID-19 declaration under Section 56 4(b)(1) of the Act, 21 U.S.C. section 360bbb-3(b)(1), unless the authorization  is terminated or revoked sooner. Performed at [facility], [place] [address] [place], [place] [address]      Time coordinating discharge: Over 30 minutes  SIGNED:   Eric J [place] DO  [facility] 01/18/2019, 8:54 AM

## 2019-02-12 ENCOUNTER — Other Ambulatory Visit: Payer: Self-pay

## 2019-02-12 NOTE — Progress Notes (Signed)
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  HPI: The patient was last seen in the hematology clinic on 11/22/2018. At that time, she felt better. Weight was down 19 pounds intentionally.  Exam revealed soft wheeze. Hematocrit was 46.3, hemoglobin 14.7, platelets 199,000, WBC 7,500.  She was smoking 7 cigarettes/day.  Smoking cessation was encouraged.   Labs on 12/20/2018: Hematocrit 44.5, hemoglobin 14.1, platelets 212,000, WBC 9,800.   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 Oxygen saturation was 85% on room air and 89-92% on 3 liters/min nasal cannula. She received IV Solu-Medrol then was switched to oral steroid taper.  Patient was advised to continue nebulizer, inhalers and Spiriva.  Home oxygen was recommended.   She was admitted to [facility] from 01/11/2019 - 01/13/2019 at [facility] with GI bleeding.  She was hypothermic and hypotensive.  Hematocrit was 19.0 and hemoglobin 5.9.  Abdomen and pelvis CT confirmed cholelithiasis without acute inflammation otherwise no abnormality.  Chest CT revealed a stable dilation of the ascending aorta without evidence of rupture.   During her hospitalization, she had large bloody stools.  EGD on 01/12/2019 by Dr Bonna Gains revealed red blood in the distal esophagus and large blood clots in the gastric fundus.  Hemospray was used to treat the distal esophagus and gastric fundus.  She received 6 units of PRBCs.  She received a Protonix drip. NSAIDs, steroids, and ASA were held. Given ongoing bleeding with increase riskhemodynamic instability, patient was transferred to [facility] on  01/13/2019 for vascular intervention.  She was admitted to [facility] from 01/13/2019 - 01/18/2019.  EGD on 01/14/2019 by Dr. Therisa Doyne revealed severe (LA grade D) esophagitis with bleeding.  There were esophageal ulcers with active bleeding.  Hemostatic clip was placed. There was erythematous mucosa in the cardia, gastric fundus, gastric body, incisura, antrum, and prepyloric region of the stomach and pylorus.  She received 1 unit of PRBCs on 01/17/2019.  Labs on 01/11/2019 revealed a ferritin 26 with an iron saturation 12% with a TIBC 247.  B12 was 173 (low) and folate 10.3 (normal).  Coombs was negative.  Haptoglobin was 130 (normal) and LDH 55 (low).  Retic was 4.8%.  She was to continue Protonix 40 mg p.o. twice daily x 8 weeks and sucralfate 1g PO QID x 2 weeks per GI.  Hemoglobin at discharge was 8.4.  She was to follow-up with Dr Allen Norris, her primary gastroenterologist.  CBC on 02/13/2019 revealed a hmatocrit 32.7, hemoglobin 10.0, platelets 223,000, WBC 4,500.  Creatinine was 0.54, calcium 8.6, albumin 3.4. TSH was 0.284 (0.350 - 4.50). Chromogranin A is pending.  During the interim, she has felt "much better". She received a B12 injection on 01/14/2019 while hospitalized. She would like to continue receiving B12 injections here at the clinic.  We discussed IV iron to replete her iron stores.  She is on 3 liters/min of portable oxygen. She uses inhalers and receives nebulized treatments prn.  She remains on Protonix 40 mg. She has finished sucralfate. She will see Dr. Therisa Doyne in 03/2019 in [place], [place] for a follow up procedure.  Patient prefers to be seen by Dr. Allen Norris because he is closer.   Her weight  is down 4 pounds (intentional). She is currently on a soft diet. She has been 45 days without a cigarette. She denies any melena since hospitalization. She has no bloody stool.    Past Medical History:  Diagnosis Date  . AAA (abdominal aortic aneurysm) ([place])   . Colon cancer ([place])   .  Hypertension     Past Surgical History:  Procedure Laterality Date  . ABDOMINAL HYSTERECTOMY    . APPENDECTOMY    . ESOPHAGOGASTRODUODENOSCOPY N/A 01/12/2019   Procedure: ESOPHAGOGASTRODUODENOSCOPY (EGD);  Surgeon: Virgel Manifold, MD;  Location: [facility] ENDOSCOPY;  Service: Endoscopy;  Laterality: N/A;  . ESOPHAGOGASTRODUODENOSCOPY (EGD) WITH PROPOFOL N/A 01/14/2019   Procedure: ESOPHAGOGASTRODUODENOSCOPY (EGD) WITH PROPOFOL;  Surgeon: [name], MD;  Location: [facility];  Service: Gastroenterology;  Laterality: N/A;  . HEMOSTASIS CLIP PLACEMENT  01/14/2019   Procedure: HEMOSTASIS CLIP PLACEMENT;  Surgeon: [name], MD;  Location: [facility] ENDOSCOPY;  Service: Gastroenterology;;    Family History  Problem Relation Age of Onset  . CAD Mother   . CAD Father     Social History:  reports that she quit smoking about 6 weeks ago. She has a 41.00 pack-year smoking history. She has never used smokeless tobacco. She reports current alcohol use. She reports that she does not use drugs. She states that she smokes 3 ppd x 20 years. She now smokes7cigarettes a day. She drinks 1-2 alcoholic drinks/month. 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 Patient denies known exposures to radiation or toxins. She lives with her mentally challenged daughter, [name]. Her other daughter, [name], helps out. Her first cousin is Burgess Estelle, Therapist, music). She has 8 great grandchildren. Patient does not drive. She lives in a trailer in [place] that is near her daughter, [name]. She has several grandchildren. She has a GED and geriatric nursing assistant degree.She has stayed safe during the pandemic, and is taking appropriate precautions. The patient is alone today.  Allergies:  Allergies  Allergen Reactions  . Aspirin     Current Medications: Current Outpatient Medications  Medication Sig Dispense Refill  . acetaminophen (TYLENOL) 500 MG tablet Take  1,000 mg by mouth every 6 (six) hours as needed for mild pain.    Marland Kitchen ADVAIR DISKUS 250-50 MCG/DOSE AEPB Inhale 1 puff into the lungs 2 (two) times daily.    Marland Kitchen albuterol (VENTOLIN HFA) 108 (90 Base) MCG/ACT inhaler Inhale 1-2 puffs into the lungs every 4 (four) hours as needed for wheezing or shortness of breath.     Marland Kitchen alendronate (FOSAMAX) 70 MG tablet Take 70 mg by mouth once a week.    . docusate sodium (COLACE) 100 MG capsule Take 100 mg by mouth daily.    Marland Kitchen ipratropium-albuterol (DUONEB) 0.5-2.5 (3) MG/3ML SOLN Take 3 mLs by nebulization every 4 (four) hours as needed. 360 mL 0  . lisinopril (ZESTRIL) 20 MG tablet Take 20 mg by mouth daily.     . montelukast (SINGULAIR) 10 MG tablet Take 10 mg by mouth at bedtime.    Marland Kitchen oxyCODONE (OXY IR/ROXICODONE) 5 MG immediate release tablet Take 5 mg by mouth 3 (three) times daily as needed for moderate pain or breakthrough pain.     . pantoprazole (PROTONIX) 40 MG tablet Take 1 tablet (40 mg total) by mouth 2 (two) times daily. 120 tablet 0  . sertraline (ZOLOFT) 50 MG tablet Take 100 mg by mouth daily.     . simvastatin (ZOCOR) 20 MG tablet Take  20 mg by mouth daily.    Marland Kitchen tiotropium (SPIRIVA) 18 MCG inhalation capsule Place 1 capsule (18 mcg total) into inhaler and inhale daily. 30 capsule 12   No current facility-administered medications for this visit.    Review of Systems  Constitutional: Positive for weight loss (4 pounds; intentional). Negative for chills, diaphoresis, fever and malaise/fatigue.       Feeling "much better".   HENT: Negative.  Negative for congestion, ear pain, hearing loss, nosebleeds, sinus pain and sore throat.   Eyes: Negative.  Negative for blurred vision and double vision.  Respiratory: Negative.  Negative for cough, hemoptysis and shortness of breath.        On 3 liters/min of oxygen.  Cardiovascular: Negative.  Negative for chest pain, palpitations, orthopnea and PND.  Gastrointestinal: Negative.  Negative for abdominal  pain, blood in stool, constipation, diarrhea, melena, nausea and vomiting.       Eating well. Soft diet.  Genitourinary: Negative.  Negative for dysuria, frequency, hematuria and urgency.  Musculoskeletal: Negative for back pain, joint pain (chronic right kneee pain), myalgias and neck pain.  Skin: Negative.  Negative for rash.  Neurological: Negative.  Negative for dizziness, tingling, sensory change, focal weakness, weakness and headaches.  Endo/Heme/Allergies: Negative.  Does not bruise/bleed easily.  Psychiatric/Behavioral: Negative.  Negative for depression and memory loss. The patient is not nervous/anxious and does not have insomnia.   All other systems reviewed and are negative.  Performance status (ECOG): 1  Vitals Blood pressure 132/88, pulse 78, temperature (!) 97.2 F (36.2 C), temperature source Tympanic, resp. rate 18, weight 153 lb 5.3 oz (69.6 kg), SpO2 100 %.   Physical Exam  Constitutional: She is oriented to person, place, and time. She appears well-developed and well-nourished. No distress.  HENT:  Head: Normocephalic and atraumatic.  Mouth/Throat: Oropharynx is clear and moist. No oropharyngeal exudate.  [place]/brown styled hair. Edentulous.  [place] in place.  Mask.  Eyes: Pupils are equal, round, and reactive to light. Conjunctivae and EOM are normal. No scleral icterus.  Blue eyes.  Neck: No JVD present.  Cardiovascular: Normal rate, regular rhythm and normal heart sounds.  No murmur heard. Pulmonary/Chest: Effort normal and breath sounds normal. No respiratory distress. She has no wheezes. She has no rales.  Abdominal: Soft. Bowel sounds are normal. She exhibits no distension and no mass. There is no hepatosplenomegaly. There is no abdominal tenderness. There is no rebound and no guarding.  Musculoskeletal:        General: No edema. Normal range of motion.     Cervical back: Normal range of motion and neck supple.  Lymphadenopathy:       Head (right side): No  preauricular, no posterior auricular and no occipital adenopathy present.       Head (left side): No preauricular, no posterior auricular and no occipital adenopathy present.    She has no cervical adenopathy.    She has no axillary adenopathy.       Right: No inguinal and no supraclavicular adenopathy present.       Left: No inguinal and no supraclavicular adenopathy present.  Neurological: She is alert and oriented to person, place, and time.  Skin: Skin is warm and dry. Ecchymosis (right lower extremity s/p fall) noted. No rash noted. She is not diaphoretic. No erythema. No pallor.  Nicotine stained nails.  Psychiatric: She has a normal mood and affect. Her behavior is normal. Judgment and thought content normal.  Nursing note and vitals reviewed.  Appointment on 02/13/2019  Component Date Value Ref Range Status  . Sodium 02/13/2019 140  135 - 145 mmol/L Final  . Potassium 02/13/2019 4.0  3.5 - 5.1 mmol/L Final  . Chloride 02/13/2019 109  98 - 111 mmol/L Final  . CO2 02/13/2019 25  22 - 32 mmol/L Final  . Glucose, Bld 02/13/2019 93  70 - 99 mg/dL Final  . BUN 02/13/2019 6* 8 - 23 mg/dL Final  . Creatinine, Ser 02/13/2019 0.54  0.44 - 1.00 mg/dL Final  . Calcium 02/13/2019 8.6* 8.9 - 10.3 mg/dL Final  . Total Protein 02/13/2019 6.5  6.5 - 8.1 g/dL Final  . Albumin 02/13/2019 3.4* 3.5 - 5.0 g/dL Final  . AST 02/13/2019 18  15 - 41 U/L Final  . ALT 02/13/2019 11  0 - 44 U/L Final  . Alkaline Phosphatase 02/13/2019 40  38 - 126 U/L Final  . Total Bilirubin 02/13/2019 0.3  0.3 - 1.2 mg/dL Final  . GFR calc non Af Amer 02/13/2019 >60  >60 mL/min Final  . GFR calc Af Amer 02/13/2019 >60  >60 mL/min Final  . Anion gap 02/13/2019 6  5 - 15 Final   Performed at [facility], [address], [place], [place] [address]  . WBC 02/13/2019 4.5  4.0 - 10.5 K/uL Final  . RBC 02/13/2019 3.67* 3.87 - 5.11 MIL/uL Final  . Hemoglobin 02/13/2019 10.0* 12.0 - 15.0 g/dL Final  . HCT  02/13/2019 32.7* 36.0 - 46.0 % Final  . MCV 02/13/2019 89.1  80.0 - 100.0 fL Final  . MCH 02/13/2019 27.2  26.0 - 34.0 pg Final  . MCHC 02/13/2019 30.6  30.0 - 36.0 g/dL Final  . RDW 02/13/2019 17.2* 11.5 - 15.5 % Final  . Platelets 02/13/2019 223  150 - 400 K/uL Final  . nRBC 02/13/2019 0.0  0.0 - 0.2 % Final  . Neutrophils Relative % 02/13/2019 53  % Final  . Neutro Abs 02/13/2019 2.4  1.7 - 7.7 K/uL Final  . Lymphocytes Relative 02/13/2019 36  % Final  . Lymphs Abs 02/13/2019 1.6  0.7 - 4.0 K/uL Final  . Monocytes Relative 02/13/2019 8  % Final  . Monocytes Absolute 02/13/2019 0.4  0.1 - 1.0 K/uL Final  . Eosinophils Relative 02/13/2019 2  % Final  . Eosinophils Absolute 02/13/2019 0.1  0.0 - 0.5 K/uL Final  . Basophils Relative 02/13/2019 1  % Final  . Basophils Absolute 02/13/2019 0.0  0.0 - 0.1 K/uL Final  . Immature Granulocytes 02/13/2019 0  % Final  . Abs Immature Granulocytes 02/13/2019 0.01  0.00 - 0.07 K/uL Final   Performed at [facility], [address], [place], [place] [address]  . TSH 02/13/2019 0.284* 0.350 - 4.500 uIU/mL Final   Comment: Performed by a 3rd Generation assay with a functional sensitivity of <=0.01 uIU/mL. 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 She has a 60+ pack year smoking history. She denies sleep apnea.   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 Last colonoscopywas 10/2012 (due in 2019 per patient). She has lost 120 pounds in 3 years with portion control and food choices.  Work-upon 01/04/2017 revealed a hematocrit of 50.8, hemoglobin 17, MCV 94.7, platelets 173,000, white count 7400 with an ANC of 5100. Differential was normal. Normal labs included CMP, epo level (  10.7), ferritin (27), with an iron saturation (6%), JAK2 V617F and exons 12-15 were negative. BCR-ABL testing was  negative for e1a2 (p190), e13a2 (b2a2, p210) and e14a2 (b3a2, p210) fusion transcripts. Carbon monoxide levelwas 17% (<9% smokers).   Low dose chest CT on 03/16/2017 was lung-RADS 2, benign appearance or behavior. There was left main and two-vessel coronary atherosclerosis. There was a 4.2 cm ascending thoracic aortic aneurysmwith recommendation for annual imaging followup by CTA or MRA. 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 There was a multinodular goiter. Suspected dominant 4.5 cm inferior left thyroid lobe nodule. There was a small to moderate hiatal hernia. Low dose chest CTon 03/20/2018 revealed benign appearance or behavior. Lung-RADS score was 2.  Thyroid ultrasound on 04/03/2017 revealed 7 nodules. Left mid thyroid nodule (labeled 4) met criteria for biopsy. The left inferior thyroid nodule (labeled 7) would meet criteria for biopsy, however, was favored to be a pseudo nodule and extension of thyroid goiter into the mediastinum given the appearance on prior CT. Left thyroid nodules (labeled 5 and 6) both met criteria for surveillance. None of the right-sided nodules met criteria for surveillance or biopsy.  Thyroid ultrasound on 09/10/2018 revealed thyromegaly with bilateral nodules, stable. None met criteria for biopsy. Recommended annual/biennial ultrasound follow-up   FNA thyroid biopsy x 2 on 04/20/2017 of the left mid thyroid nodule and left inferior thyroid nodule were benign (Bethesda category II) and c/w benign follicular nodule.   She began a phlebotomy programon 02/08/2017 (last 10/12/2018). She undergoes small volume phlebotomy (300 cc)if her hematocrit is >47.  She was admitted to [facility] from 01/11/2019 - 01/13/2019 then transferred to [facility] from 01/13/2019 - 01/18/2019 for GI bleeding.  Hemoglobin was 5.9.  EGD on 01/12/2019 revealed red blood in the distal esophagus and large blood clots in the gastric fundus. Hemospray was used to treat the distal  esophagus and gastric fundus.  She received 6 units of PRBCs.  She received a Protonix drip. She was transferred to [facility] for vascular intervention.  EGD on 01/14/2019 revealed severe (LA grade D) esophagitis with bleeding.  There were esophageal ulcers with active bleeding.  Hemostatic clip was placed. There was erythematous mucosa in the cardia, gastric fundus, gastric body, incisura, antrum, and prepyloric region of the stomach and pylorus.  She received 1 unit of PRBCs on 01/17/2019.  She has iron deficiency anemia s/p massive GI bleeding.  Labs on 01/11/2019 revealed a ferritin 26 with an iron saturation 12% with a TIBC 247.  B12 was 173 (low) and folate 10.3 (normal).  Hemolysis evaluation (Coombs, haptoglobin, LDH) was normal.  Retic was 4.8%.  Symptomatically, she is feeling better.  She denies any melena or hematochezia.  Exam is stable.  Plan: 1.   Review labs from 02/13/2019. 2.   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 Hematocrit 32.7  Hemoglobin 10.0.  MCV 89.1.  She stopped smoking 45 days ago.   Encouragement provided. 3.  GI bleed  Details of recent hospitalization at [facility] reviewed.  EGD on 01/14/2019 revealed severe esophagitis with ulcers and active bleeding.  She completed a course of Carafate and remains on Protonix.  She is due for a repeat upper endoscopy.   Patient wishes to stay local rather than to go to [facility]. 4.   Iron deficiency  Hematocrit 32.7  Hemoglobin 10.0.  MCV 89.1.  Ferritin 26 with an iron saturation of 12% and a TIBC of 247.  Discuss consideration  of IV iron.   Dr Allen Norris contacted and agrees oral iron may be problematic given her esophageal ulceration.  Preauth Venofer. 5.   B12 deficiency  B12 was 173 on 01/11/2019.  She received B12 on 01/14/2019.  Patient wishes to continue B12 injections.  Preauth B12.  Begin B12 today and monthly x 6. 6.  History of  carcinoid tumor  Check chromogranin today. 7.   Thyroid nodules Thyroid ultrasound on 09/10/2018 revealed stable bilateral thyroid nodules.             Continue to monitor. 8.Weight loss             Patient notes ongoing intentional weight loss.             Goal weight 145 pounds.             Continue to monitor. 9.   RTC in 1 month for MD assessment and labs (CBC with diff).  Addendum:  Chromogranin was 926.7 (0-101.8).  Unclear if elevation due to recurrent carcinoid, chronic atrophic gastritis or PPI.  Check urine for 5HIAA.  Consider octreotide scan.  I discussed the assessment and treatment plan with the patient.  The patient was provided an opportunity to ask questions and all were answered.  The patient agreed with the plan and demonstrated an understanding of the instructions.  The patient was advised to call back if the symptoms worsen or if the condition fails to improve as anticipated.  I provided 20 minutes of face-to-face time during this this encounter and > 50% was spent counseling as documented under my assessment and plan.  Additional 15 minutes was spent reviewing hospital records.   Lequita Asal, MD, PhD    02/14/2019, 2:27 PM  I, Selena Batten, am acting as scribe for [employer]. Mike Gip, MD, PhD.  I, Kaycee Haycraft C. Mike Gip, MD, have reviewed the above documentation for accuracy and completeness, and I agree with the above.

## 2019-02-13 ENCOUNTER — Inpatient Hospital Stay: Payer: Medicare HMO | Attending: Hematology and Oncology

## 2019-02-13 DIAGNOSIS — D751 Secondary polycythemia: Secondary | ICD-10-CM | POA: Insufficient documentation

## 2019-02-13 DIAGNOSIS — E041 Nontoxic single thyroid nodule: Secondary | ICD-10-CM

## 2019-02-13 DIAGNOSIS — Z90710 Acquired absence of both cervix and uterus: Secondary | ICD-10-CM | POA: Insufficient documentation

## 2019-02-13 DIAGNOSIS — Z79899 Other long term (current) drug therapy: Secondary | ICD-10-CM | POA: Insufficient documentation

## 2019-02-13 DIAGNOSIS — Z7951 Long term (current) use of inhaled steroids: Secondary | ICD-10-CM | POA: Insufficient documentation

## 2019-02-13 DIAGNOSIS — Z85038 Personal history of other malignant neoplasm of large intestine: Secondary | ICD-10-CM | POA: Insufficient documentation

## 2019-02-13 DIAGNOSIS — D509 Iron deficiency anemia, unspecified: Secondary | ICD-10-CM | POA: Diagnosis present

## 2019-02-13 DIAGNOSIS — K2211 Ulcer of esophagus with bleeding: Secondary | ICD-10-CM | POA: Diagnosis not present

## 2019-02-13 DIAGNOSIS — I10 Essential (primary) hypertension: Secondary | ICD-10-CM | POA: Diagnosis not present

## 2019-02-13 DIAGNOSIS — E538 Deficiency of other specified B group vitamins: Secondary | ICD-10-CM | POA: Diagnosis not present

## 2019-02-13 DIAGNOSIS — R062 Wheezing: Secondary | ICD-10-CM | POA: Insufficient documentation

## 2019-02-13 DIAGNOSIS — E042 Nontoxic multinodular goiter: Secondary | ICD-10-CM | POA: Diagnosis not present

## 2019-02-13 DIAGNOSIS — F17210 Nicotine dependence, cigarettes, uncomplicated: Secondary | ICD-10-CM | POA: Diagnosis not present

## 2019-02-13 LAB — COMPREHENSIVE METABOLIC PANEL
ALT: 11 U/L (ref 0–44)
AST: 18 U/L (ref 15–41)
Albumin: 3.4 g/dL — ABNORMAL LOW (ref 3.5–5.0)
Alkaline Phosphatase: 40 U/L (ref 38–126)
Anion gap: 6 (ref 5–15)
BUN: 6 mg/dL — ABNORMAL LOW (ref 8–23)
CO2: 25 mmol/L (ref 22–32)
Calcium: 8.6 mg/dL — ABNORMAL LOW (ref 8.9–10.3)
Chloride: 109 mmol/L (ref 98–111)
Creatinine, Ser: 0.54 mg/dL (ref 0.44–1.00)
GFR calc Af Amer: >60 mL/min (ref >60)
GFR calc non Af Amer: >60 mL/min (ref >60)
Glucose, Bld: 93 mg/dL (ref 70–99)
Potassium: 4 mmol/L (ref 3.5–5.1)
Sodium: 140 mmol/L (ref 135–145)
Total Bilirubin: 0.3 mg/dL (ref 0.3–1.2)
Total Protein: 6.5 g/dL (ref 6.5–8.1)

## 2019-02-13 LAB — CBC WITH DIFFERENTIAL/PLATELET
Abs Immature Granulocytes: 0.01 10*3/uL (ref 0.00–0.07)
Basophils Absolute: 0 10*3/uL (ref 0.0–0.1)
Basophils Relative: 1 %
Eosinophils Absolute: 0.1 10*3/uL (ref 0.0–0.5)
Eosinophils Relative: 2 %
HCT: 32.7 % — ABNORMAL LOW (ref 36.0–46.0)
Hemoglobin: 10 g/dL — ABNORMAL LOW (ref 12.0–15.0)
Immature Granulocytes: 0 %
Lymphocytes Relative: 36 %
Lymphs Abs: 1.6 10*3/uL (ref 0.7–4.0)
MCH: 27.2 pg (ref 26.0–34.0)
MCHC: 30.6 g/dL (ref 30.0–36.0)
MCV: 89.1 fL (ref 80.0–100.0)
Monocytes Absolute: 0.4 10*3/uL (ref 0.1–1.0)
Monocytes Relative: 8 %
Neutro Abs: 2.4 10*3/uL (ref 1.7–7.7)
Neutrophils Relative %: 53 %
Platelets: 223 10*3/uL (ref 150–400)
RBC: 3.67 MIL/uL — ABNORMAL LOW (ref 3.87–5.11)
RDW: 17.2 % — ABNORMAL HIGH (ref 11.5–15.5)
WBC: 4.5 10*3/uL (ref 4.0–10.5)
nRBC: 0 % (ref 0.0–0.2)

## 2019-02-13 LAB — TSH: TSH: 0.284 u[IU]/mL — ABNORMAL LOW (ref 0.350–4.500)

## 2019-02-14 ENCOUNTER — Other Ambulatory Visit: Payer: Medicare HMO

## 2019-02-14 ENCOUNTER — Other Ambulatory Visit: Payer: Self-pay

## 2019-02-14 ENCOUNTER — Inpatient Hospital Stay (HOSPITAL_BASED_OUTPATIENT_CLINIC_OR_DEPARTMENT_OTHER): Payer: Medicare HMO | Admitting: Hematology and Oncology

## 2019-02-14 ENCOUNTER — Inpatient Hospital Stay: Payer: Medicare HMO

## 2019-02-14 ENCOUNTER — Encounter: Payer: Self-pay | Admitting: Hematology and Oncology

## 2019-02-14 ENCOUNTER — Other Ambulatory Visit: Payer: Self-pay | Admitting: Hematology and Oncology

## 2019-02-14 VITALS — BP 132/88 | HR 78 | Temp 97.2°F | Resp 18 | Wt 153.3 lb

## 2019-02-14 DIAGNOSIS — E538 Deficiency of other specified B group vitamins: Secondary | ICD-10-CM | POA: Diagnosis not present

## 2019-02-14 DIAGNOSIS — D751 Secondary polycythemia: Secondary | ICD-10-CM

## 2019-02-14 DIAGNOSIS — D500 Iron deficiency anemia secondary to blood loss (chronic): Secondary | ICD-10-CM

## 2019-02-14 DIAGNOSIS — Z0000 Encounter for general adult medical examination without abnormal findings: Secondary | ICD-10-CM

## 2019-02-14 DIAGNOSIS — Z85110 Personal history of malignant carcinoid tumor of bronchus and lung: Secondary | ICD-10-CM

## 2019-02-14 DIAGNOSIS — R7989 Other specified abnormal findings of blood chemistry: Secondary | ICD-10-CM

## 2019-02-14 DIAGNOSIS — Z122 Encounter for screening for malignant neoplasm of respiratory organs: Secondary | ICD-10-CM

## 2019-02-14 DIAGNOSIS — D509 Iron deficiency anemia, unspecified: Secondary | ICD-10-CM | POA: Diagnosis not present

## 2019-02-14 LAB — IRON AND TIBC
Iron: 19 ug/dL — ABNORMAL LOW (ref 28–170)
Saturation Ratios: 4 % — ABNORMAL LOW (ref 10.4–31.8)
TIBC: 434 ug/dL (ref 250–450)
UIBC: 415 ug/dL

## 2019-02-14 LAB — CHROMOGRANIN A: Chromogranin A (ng/mL): 926.7 ng/mL — ABNORMAL HIGH (ref 0.0–101.8)

## 2019-02-14 LAB — FERRITIN: Ferritin: 11 ng/mL (ref 11–307)

## 2019-02-14 LAB — T4, FREE: Free T4: 0.81 ng/dL (ref 0.61–1.12)

## 2019-02-14 LAB — FOLATE: Folate: 22 ng/mL (ref >5.9)

## 2019-02-14 MED ORDER — CYANOCOBALAMIN 1000 MCG/ML IJ SOLN
1000.0000 ug | Freq: Once | INTRAMUSCULAR | Status: AC
  Administered 2019-02-14: 1000 ug via INTRAMUSCULAR

## 2019-02-14 NOTE — Patient Instructions (Signed)

## 2019-02-14 NOTE — Progress Notes (Signed)
Patient here for follow up. Denies any concerns. Patient states she has quit smoking. States it has been approx. 45 days.

## 2019-02-14 NOTE — Progress Notes (Signed)
Verified no prior auth needed per Elliot Gault for patient to receive B12 injection today

## 2019-02-18 ENCOUNTER — Other Ambulatory Visit: Payer: Self-pay

## 2019-02-18 ENCOUNTER — Inpatient Hospital Stay: Payer: Medicare HMO

## 2019-02-18 VITALS — BP 122/74 | HR 75 | Temp 98.0°F | Resp 20

## 2019-02-18 DIAGNOSIS — D509 Iron deficiency anemia, unspecified: Secondary | ICD-10-CM | POA: Diagnosis not present

## 2019-02-18 DIAGNOSIS — D751 Secondary polycythemia: Secondary | ICD-10-CM

## 2019-02-18 MED ORDER — IRON SUCROSE 20 MG/ML IV SOLN
200.0000 mg | Freq: Once | INTRAVENOUS | Status: AC
  Administered 2019-02-18: 200 mg via INTRAVENOUS

## 2019-02-18 MED ORDER — SODIUM CHLORIDE 0.9 % IV SOLN: 200.0000 mg | Freq: Once | INTRAVENOUS | Status: DC

## 2019-02-18 MED ORDER — SODIUM CHLORIDE 0.9 % IV SOLN
Freq: Once | INTRAVENOUS | Status: AC
  Filled 2019-02-18: qty 250

## 2019-02-18 NOTE — Patient Instructions (Signed)

## 2019-02-21 ENCOUNTER — Encounter: Payer: Self-pay | Admitting: Emergency Medicine

## 2019-02-21 ENCOUNTER — Emergency Department
Admission: EM | Admit: 2019-02-21 | Discharge: 2019-02-21 | Disposition: A | Discharge status: Home / Self Care | Payer: Medicare HMO | Attending: Emergency Medicine | Admitting: Emergency Medicine

## 2019-02-21 ENCOUNTER — Other Ambulatory Visit: Payer: Self-pay

## 2019-02-21 DIAGNOSIS — Z79899 Other long term (current) drug therapy: Secondary | ICD-10-CM | POA: Insufficient documentation

## 2019-02-21 DIAGNOSIS — R1013 Epigastric pain: Secondary | ICD-10-CM | POA: Diagnosis present

## 2019-02-21 DIAGNOSIS — J449 Chronic obstructive pulmonary disease, unspecified: Secondary | ICD-10-CM | POA: Diagnosis not present

## 2019-02-21 DIAGNOSIS — Z85038 Personal history of other malignant neoplasm of large intestine: Secondary | ICD-10-CM | POA: Diagnosis not present

## 2019-02-21 DIAGNOSIS — Z87891 Personal history of nicotine dependence: Secondary | ICD-10-CM | POA: Diagnosis not present

## 2019-02-21 DIAGNOSIS — I10 Essential (primary) hypertension: Secondary | ICD-10-CM | POA: Insufficient documentation

## 2019-02-21 DIAGNOSIS — K2970 Gastritis, unspecified, without bleeding: Secondary | ICD-10-CM | POA: Diagnosis not present

## 2019-02-21 LAB — COMPREHENSIVE METABOLIC PANEL
ALT: 13 U/L (ref 0–44)
AST: 21 U/L (ref 15–41)
Albumin: 3.7 g/dL (ref 3.5–5.0)
Alkaline Phosphatase: 43 U/L (ref 38–126)
Anion gap: 7 (ref 5–15)
BUN: 5 mg/dL — ABNORMAL LOW (ref 8–23)
CO2: 27 mmol/L (ref 22–32)
Calcium: 8.8 mg/dL — ABNORMAL LOW (ref 8.9–10.3)
Chloride: 106 mmol/L (ref 98–111)
Creatinine, Ser: 0.59 mg/dL (ref 0.44–1.00)
GFR calc Af Amer: >60 mL/min (ref >60)
GFR calc non Af Amer: >60 mL/min (ref >60)
Glucose, Bld: 90 mg/dL (ref 70–99)
Potassium: 4.6 mmol/L (ref 3.5–5.1)
Sodium: 140 mmol/L (ref 135–145)
Total Bilirubin: 0.5 mg/dL (ref 0.3–1.2)
Total Protein: 6.9 g/dL (ref 6.5–8.1)

## 2019-02-21 LAB — URINALYSIS, COMPLETE (UACMP) WITH MICROSCOPIC
Bilirubin Urine: NEGATIVE
Glucose, UA: NEGATIVE mg/dL
Hgb urine dipstick: NEGATIVE
Ketones, ur: NEGATIVE mg/dL
Nitrite: NEGATIVE
Protein, ur: NEGATIVE mg/dL
Specific Gravity, Urine: 1.006 (ref 1.005–1.030)
pH: 6 (ref 5.0–8.0)

## 2019-02-21 LAB — CBC
HCT: 33.8 % — ABNORMAL LOW (ref 36.0–46.0)
Hemoglobin: 10 g/dL — ABNORMAL LOW (ref 12.0–15.0)
MCH: 26.2 pg (ref 26.0–34.0)
MCHC: 29.6 g/dL — ABNORMAL LOW (ref 30.0–36.0)
MCV: 88.7 fL (ref 80.0–100.0)
Platelets: 221 10*3/uL (ref 150–400)
RBC: 3.81 MIL/uL — ABNORMAL LOW (ref 3.87–5.11)
RDW: 17.5 % — ABNORMAL HIGH (ref 11.5–15.5)
WBC: 7.2 10*3/uL (ref 4.0–10.5)
nRBC: 0 % (ref 0.0–0.2)

## 2019-02-21 LAB — LIPASE, BLOOD: Lipase: 21 U/L (ref 11–51)

## 2019-02-21 MED ORDER — LIDOCAINE VISCOUS HCL 2 % MT SOLN
15.0000 mL | Freq: Once | OROMUCOSAL | Status: AC
  Administered 2019-02-21: 15 mL via ORAL
  Filled 2019-02-21: qty 15

## 2019-02-21 MED ORDER — SUCRALFATE 1 G PO TABS: 1.0000 g | ORAL_TABLET | Freq: Four times a day (QID) | ORAL | 0 refills | Status: DC

## 2019-02-21 MED ORDER — ALUM & MAG HYDROXIDE-SIMETH 200-200-20 MG/5ML PO SUSP
30.0000 mL | Freq: Once | ORAL | Status: AC
  Administered 2019-02-21: 30 mL via ORAL
  Filled 2019-02-21: qty 30

## 2019-02-21 NOTE — ED Provider Notes (Signed)
[facility] Emergency Department Provider Note   ____________________________________________    I have reviewed the triage vital signs and the nursing notes.   HISTORY  Chief Complaint Abdominal Pain     HPI Alicia Holder is a 69 y.o. female who presents with complaints of epigastric discomfort.  She reports the discomfort is mild to moderate however she is concerned because she had a bleeding esophageal ulcer in early December which led to her being hospitalized and receiving PRBCs.  She reports her stool has been normal.  She describes a burning sensation in her epigastrium, no vomiting.  Has been taking her medications as prescribed.  No fevers or chills.  She reports she is primarily here to have her hemoglobin checked to make sure it has not dropped  Past Medical History:  Diagnosis Date  . AAA (abdominal aortic aneurysm) ([place])   . Colon cancer ([place])   . Hypertension     Patient Active Problem List   Diagnosis Date Noted  . Acute posthemorrhagic anemia 01/13/2019  . COPD (chronic obstructive pulmonary disease) ([place]) 01/13/2019  . H/O colon cancer, stage II 01/13/2019  . Polycythemia vera ([place]) 01/13/2019  . Severe anemia 01/12/2019  . Hemorrhagic shock ([place]) 01/12/2019  . Esophageal hemorrhage   . GI bleed   . Vitamin B12 deficiency   . HTN (hypertension)   . Hyperlipidemia   . Tobacco abuse counseling   . COPD with acute exacerbation ([place]) 01/01/2019  . Acute respiratory failure with hypoxia ([place]) 01/01/2019  . COPD exacerbation ([place]) 01/01/2019  . Goals of care, counseling/discussion 05/10/2017  . Aortic aneurysm, thoracic ([place]) 03/30/2017  . Thyroid nodule 03/30/2017  . 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 ABDOMINAL HYSTERECTOMY    . APPENDECTOMY    . ESOPHAGOGASTRODUODENOSCOPY N/A 01/12/2019   Procedure: ESOPHAGOGASTRODUODENOSCOPY (EGD);  Surgeon: Virgel Manifold, MD;  Location: [facility] ENDOSCOPY;  Service: Endoscopy;  Laterality: N/A;  . ESOPHAGOGASTRODUODENOSCOPY (EGD) WITH PROPOFOL N/A 01/14/2019   Procedure: ESOPHAGOGASTRODUODENOSCOPY (EGD) WITH PROPOFOL;  Surgeon: [name], MD;  Location: [place];  Service: Gastroenterology;  Laterality: N/A;  . HEMOSTASIS CLIP PLACEMENT  01/14/2019   Procedure: HEMOSTASIS CLIP PLACEMENT;  Surgeon: [name], MD;  Location: [place];  Service: Gastroenterology;;    Prior to Admission medications   Medication Sig Start Date End Date Taking? Authorizing Provider  acetaminophen (TYLENOL) 500 MG tablet Take 1,000 mg by mouth every 6 (six) hours as needed for mild pain.    [provider]  ADVAIR DISKUS 250-50 MCG/DOSE AEPB Inhale 1 puff into the lungs 2 (two) times daily. 11/14/16   [provider]  albuterol (VENTOLIN HFA) 108 (90 Base) MCG/ACT inhaler Inhale 1-2 puffs into the lungs every 4 (four) hours as needed for wheezing or shortness of breath.  07/09/18   [provider]  alendronate (FOSAMAX) 70 MG tablet Take 70 mg by mouth once a week. 07/26/17   [provider]  docusate sodium (COLACE) 100 MG capsule Take 100 mg by mouth daily.    [provider]  ipratropium-albuterol (DUONEB) 0.5-2.5 (3) MG/3ML SOLN Take 3 mLs by nebulization every 4 (four) hours as needed. 01/05/19   Fritzi Mandes, MD  lisinopril (ZESTRIL) 20 MG tablet Take 20 mg by mouth daily.     [provider]  montelukast (SINGULAIR) 10 MG tablet Take 10 mg by mouth at bedtime. 11/14/16   [provider]  oxyCODONE (OXY  IR/ROXICODONE) 5 MG immediate release tablet Take 5 mg by mouth 3 (three) times daily as needed for moderate pain or breakthrough pain.     [provider]  pantoprazole (PROTONIX) 40 MG tablet Take 1 tablet (40 mg total) by mouth 2 (two) times daily. 01/18/19 03/19/19  [place] Donnamarie Poag, DO  sertraline (ZOLOFT) 50 MG tablet Take 100 mg by mouth daily.   11/14/16   [provider]  simvastatin (ZOCOR) 20 MG tablet Take 20 mg by mouth daily. 11/14/16   [provider]  sucralfate (CARAFATE) 1 g tablet Take 1 tablet (1 g total) by mouth 4 (four) times daily for 15 days. 02/21/19 03/08/19  Lavonia Drafts, MD  tiotropium (SPIRIVA) 18 MCG inhalation capsule Place 1 capsule (18 mcg total) into inhaler and inhale daily. 01/05/19   Fritzi Mandes, MD     Allergies Aspirin  Family History  Problem Relation Age of Onset  . CAD Mother   . CAD Father     Social History Social History   Tobacco Use  . Smoking status: Former Smoker    Packs/day: 1.00    Years: 41.00    Pack years: 41.00    Quit date: 01/01/2019    Years since quitting: 0.1  . Smokeless tobacco: Never Used  Substance Use Topics  . Alcohol use: Yes    Comment: Once every 3 weeks  . Drug use: Never    Review of Systems  Constitutional: No fever/chills Eyes: No visual changes.  ENT: No sore throat. Cardiovascular: Denies chest pain. Respiratory: Denies shortness of breath. Gastrointestinal: As above Genitourinary: Negative for dysuria. Musculoskeletal: Negative for back pain. Skin: Negative for rash. Neurological: Negative for headaches or weakness   ____________________________________________   PHYSICAL EXAM:  VITAL SIGNS: ED Triage Vitals  Enc Vitals Group     BP 02/21/19 0943 119/79     Pulse Rate 02/21/19 0943 85     Resp 02/21/19 0943 18     Temp 02/21/19 0943 98.6 F (37 C)     Temp Source 02/21/19 0943 Oral     SpO2 02/21/19 0943 100 %     Weight 02/21/19 0941 68 kg (150 lb)     Height 02/21/19 0941 1.626 m (5\' 4" )     Head Circumference --      Peak Flow --      Pain Score 02/21/19 0941 7     Pain Loc --      Pain Edu? --      Excl. in [place]? --     Constitutional: Alert and oriented. No acute distress. Eyes: Conjunctivae are normal.  Head: Atraumatic.  Mouth/Throat: Mucous membranes are moist.   Neck:  Painless  ROM Cardiovascular: Normal rate, regular rhythm.  Good peripheral circulation. Respiratory: Normal respiratory effort.  No retractions.  Gastrointestinal: Soft and nontender. No distention.  No CVA tenderness.  Reassuring exam  Musculoskeletal: Warm and well perfused Neurologic:  Normal speech and language. No gross focal neurologic deficits are appreciated.  Skin:  Skin is warm, dry and intact. No rash noted. Psychiatric: Mood and affect are normal. Speech and behavior are normal.  ____________________________________________   LABS (all labs ordered are listed, but only abnormal results are displayed)  Labs Reviewed  COMPREHENSIVE METABOLIC PANEL - Abnormal; Notable for the following components:      Result Value   BUN 5 (*)    Calcium 8.8 (*)    All other components within normal limits  CBC - Abnormal; Notable  for the following components:   RBC 3.81 (*)    Hemoglobin 10.0 (*)    HCT 33.8 (*)    MCHC 29.6 (*)    RDW 17.5 (*)    All other components within normal limits  URINALYSIS, COMPLETE (UACMP) WITH MICROSCOPIC - Abnormal; Notable for the following components:   Color, Urine YELLOW (*)    APPearance CLEAR (*)    Leukocytes,Ua SMALL (*)    Bacteria, UA RARE (*)    All other components within normal limits  LIPASE, BLOOD   ____________________________________________  EKG  ED ECG REPORT I, Lavonia Drafts, the attending physician, personally viewed and interpreted this ECG.  Date: 02/21/2019  Rhythm: normal sinus rhythm QRS Axis: normal Intervals: normal ST/T Wave abnormalities: normal Narrative Interpretation: no evidence of acute ischemia  ____________________________________________  RADIOLOGY   ____________________________________________   PROCEDURES  Procedure(s) performed: No  Procedures   Critical Care performed: No ____________________________________________   INITIAL IMPRESSION / ASSESSMENT AND PLAN / ED COURSE  Pertinent labs &  imaging results that were available during my care of the patient were reviewed by me and considered in my medical decision making (see chart for details).  Patient well-appearing and in no acute distress, she is greatly relieved to hear that her hemoglobin is the same as a week ago.  Given normal stools and no change in hemoglobin doubt any bleeding at this time.  She was treated with a GI cocktail which resolved her pain as well.  She may be having some discomfort from her esophagitis, she does see Dr. Allen Norris.  The rest of her lab work is unremarkable.  Given that she is feeling better, no evidence of bleeding with stable hemoglobin appropriate for discharge at this time with close follow-up with her gastroenterologist, we did discuss return precautions including any bleeding, vomiting, dark stools    ____________________________________________   FINAL CLINICAL IMPRESSION(S) / ED DIAGNOSES  Final diagnoses:  Gastritis without bleeding, unspecified chronicity, unspecified gastritis type        Note:  This document was prepared using Dragon voice recognition software and may include unintentional dictation errors.   Lavonia Drafts, MD 02/21/19 [id number]

## 2019-02-21 NOTE — ED Triage Notes (Signed)
Patient presents to the ED with upper abdominal pain that began yesterday with dizziness today.  Patient had a GI bleed in December.  Patient is on 3L of oxygen [place] at baseline.  Patient is alert and oriented x 4 at this time.

## 2019-02-25 ENCOUNTER — Other Ambulatory Visit: Payer: Self-pay

## 2019-02-25 ENCOUNTER — Inpatient Hospital Stay: Payer: Medicare HMO

## 2019-02-25 VITALS — BP 118/72 | HR 69 | Temp 97.4°F | Resp 18

## 2019-02-25 DIAGNOSIS — D509 Iron deficiency anemia, unspecified: Secondary | ICD-10-CM | POA: Diagnosis not present

## 2019-02-25 DIAGNOSIS — D751 Secondary polycythemia: Secondary | ICD-10-CM

## 2019-02-25 MED ORDER — IRON SUCROSE 20 MG/ML IV SOLN
200.0000 mg | Freq: Once | INTRAVENOUS | Status: AC
  Administered 2019-02-25: 200 mg via INTRAVENOUS

## 2019-02-25 MED ORDER — SODIUM CHLORIDE 0.9 % IV SOLN
Freq: Once | INTRAVENOUS | Status: AC
  Filled 2019-02-25: qty 250

## 2019-02-25 MED ORDER — SODIUM CHLORIDE 0.9 % IV SOLN: 200.0000 mg | Freq: Once | INTRAVENOUS | Status: DC

## 2019-02-25 NOTE — Patient Instructions (Signed)

## 2019-03-04 ENCOUNTER — Other Ambulatory Visit: Payer: Self-pay

## 2019-03-04 ENCOUNTER — Inpatient Hospital Stay: Payer: Medicare HMO

## 2019-03-04 ENCOUNTER — Inpatient Hospital Stay: Payer: Medicare HMO | Attending: Hematology and Oncology

## 2019-03-04 VITALS — BP 120/70 | HR 65 | Temp 96.5°F | Resp 17

## 2019-03-04 DIAGNOSIS — D509 Iron deficiency anemia, unspecified: Secondary | ICD-10-CM | POA: Insufficient documentation

## 2019-03-04 DIAGNOSIS — E041 Nontoxic single thyroid nodule: Secondary | ICD-10-CM

## 2019-03-04 DIAGNOSIS — D751 Secondary polycythemia: Secondary | ICD-10-CM

## 2019-03-04 DIAGNOSIS — E538 Deficiency of other specified B group vitamins: Secondary | ICD-10-CM | POA: Insufficient documentation

## 2019-03-04 LAB — CBC
HCT: 36.9 % (ref 36.0–46.0)
Hemoglobin: 11 g/dL — ABNORMAL LOW (ref 12.0–15.0)
MCH: 26.3 pg (ref 26.0–34.0)
MCHC: 29.8 g/dL — ABNORMAL LOW (ref 30.0–36.0)
MCV: 88.3 fL (ref 80.0–100.0)
Platelets: 148 10*3/uL — ABNORMAL LOW (ref 150–400)
RBC: 4.18 MIL/uL (ref 3.87–5.11)
RDW: 19.4 % — ABNORMAL HIGH (ref 11.5–15.5)
WBC: 5.9 10*3/uL (ref 4.0–10.5)
nRBC: 0 % (ref 0.0–0.2)

## 2019-03-04 MED ORDER — SODIUM CHLORIDE 0.9 % IV SOLN: 200.0000 mg | Freq: Once | INTRAVENOUS | Status: DC

## 2019-03-04 MED ORDER — IRON SUCROSE 20 MG/ML IV SOLN
200.0000 mg | Freq: Once | INTRAVENOUS | Status: AC
  Administered 2019-03-04: 14:00:00 200 mg via INTRAVENOUS
  Filled 2019-03-04: qty 10

## 2019-03-04 MED ORDER — SODIUM CHLORIDE 0.9 % IV SOLN
Freq: Once | INTRAVENOUS | Status: AC
  Filled 2019-03-04: qty 250

## 2019-03-04 NOTE — Patient Instructions (Signed)

## 2019-03-08 ENCOUNTER — Ambulatory Visit (INDEPENDENT_AMBULATORY_CARE_PROVIDER_SITE_OTHER): Payer: Medicare HMO | Admitting: Gastroenterology

## 2019-03-08 ENCOUNTER — Other Ambulatory Visit: Payer: Self-pay

## 2019-03-08 ENCOUNTER — Encounter: Payer: Self-pay | Admitting: Gastroenterology

## 2019-03-08 VITALS — BP 139/70 | HR 70 | Temp 96.6°F | Ht 64.0 in | Wt 156.4 lb

## 2019-03-08 DIAGNOSIS — K228 Other specified diseases of esophagus: Secondary | ICD-10-CM | POA: Diagnosis not present

## 2019-03-08 DIAGNOSIS — K2289 Other specified disease of esophagus: Secondary | ICD-10-CM

## 2019-03-08 NOTE — Progress Notes (Signed)
Primary Care Physician: Ellamae Sia, MD  Primary Gastroenterologist:  Dr. Lucilla Lame  Chief Complaint  Patient presents with  . Gastritis    HPI: Alicia Holder is a 69 y.o. female here for follow-up after being discharged from the hospital.  The patient was admitted to [facility] for a upper GI bleed and underwent an upper endoscopy by Dr. Bonna Gains.  At the time of her endoscopy she could not see where the bleeding was coming from and transferred the patient to [facility] for possible interventional radiology embolization.  The patient underwent a another upper endoscopy by one of the gastroenterologist at [facility] and a red spot was seen in the esophagus at 34 cm from the incisors that was believed to be a visible vessel and the source of the patient's bleeding.  The lesion was clipped with a Endo Clip.  The patient was then discharged when stable and sent back to [place] for outpatient follow-up post discharge. The patient denies any black stools or bloody stools.  She also denies any abdominal pain nausea vomiting fevers or chills.   Past Medical History:  Diagnosis Date  . AAA (abdominal aortic aneurysm) ([place])   . Colon cancer ([place])   . Hypertension     Current Outpatient Medications  Medication Sig Dispense Refill  . acetaminophen (TYLENOL) 500 MG tablet Take 1,000 mg by mouth every 6 (six) hours as needed for mild pain.    Marland Kitchen albuterol (VENTOLIN HFA) 108 (90 Base) MCG/ACT inhaler Inhale 1-2 puffs into the lungs every 4 (four) hours as needed for wheezing or shortness of breath.     Marland Kitchen alendronate (FOSAMAX) 70 MG tablet Take 70 mg by mouth once a week.    . docusate sodium (COLACE) 100 MG capsule Take 100 mg by mouth daily.    Marland Kitchen ipratropium-albuterol (DUONEB) 0.5-2.5 (3) MG/3ML SOLN Take 3 mLs by nebulization every 4 (four) hours as needed. 360 mL 0  . lisinopril (ZESTRIL) 20 MG tablet Take 20 mg by mouth daily.     . montelukast (SINGULAIR) 10 MG tablet Take 10 mg by  mouth at bedtime.    Marland Kitchen oxyCODONE (OXY IR/ROXICODONE) 5 MG immediate release tablet Take 5 mg by mouth 3 (three) times daily as needed for moderate pain or breakthrough pain.     . pantoprazole (PROTONIX) 40 MG tablet Take 1 tablet (40 mg total) by mouth 2 (two) times daily. 120 tablet 0  . sertraline (ZOLOFT) 50 MG tablet Take 100 mg by mouth daily.     . simvastatin (ZOCOR) 20 MG tablet Take 20 mg by mouth daily.    . sucralfate (CARAFATE) 1 g tablet Take 1 tablet (1 g total) by mouth 4 (four) times daily for 15 days. 60 tablet 0  . TRELEGY ELLIPTA 100-62.5-25 MCG/INH AEPB     . ADVAIR DISKUS 250-50 MCG/DOSE AEPB Inhale 1 puff into the lungs 2 (two) times daily.    Marland Kitchen buPROPion (WELLBUTRIN XL) 150 MG 24 hr tablet     . tiotropium (SPIRIVA) 18 MCG inhalation capsule Place 1 capsule (18 mcg total) into inhaler and inhale daily. (Patient not taking: Reported on 03/08/2019) 30 capsule 12   No current facility-administered medications for this visit.    Allergies as of 03/08/2019 - Review Complete 03/08/2019  Allergen Reaction Noted  . Aspirin  02/14/2019    ROS:  General: Negative for anorexia, weight loss, fever, chills, fatigue, weakness. ENT: Negative for hoarseness, difficulty swallowing , nasal  congestion. CV: Negative for chest pain, angina, palpitations, dyspnea on exertion, peripheral edema.  Respiratory: Negative for dyspnea at rest, dyspnea on exertion, cough, sputum, wheezing.  GI: See history of present illness. GU:  Negative for dysuria, hematuria, urinary incontinence, urinary frequency, nocturnal urination.  Endo: Negative for unusual weight change.    Physical Examination:   BP 139/70   Pulse 70   Temp (!) 96.6 F (35.9 C) (Temporal)   Ht 5\' 4"  (1.626 m)   Wt 156 lb 6.4 oz (70.9 kg)   BMI 26.85 kg/m   General: Well-nourished, well-developed in no acute distress.  Eyes: No icterus. Conjunctivae pink. Lungs: Clear to auscultation bilaterally. Non-labored. Heart:  Regular rate and rhythm, no murmurs rubs or gallops.  Abdomen: Bowel sounds are normal, nontender, nondistended, no hepatosplenomegaly or masses, no abdominal bruits or hernia , no rebound or guarding.   Extremities: No lower extremity edema. No clubbing or deformities. Neuro: Alert and oriented x 3.  Grossly intact. Skin: Warm and dry, no jaundice.   Psych: Alert and cooperative, normal mood and affect.  Labs:    Imaging Studies: No results found.  Assessment and Plan:   Alicia Holder is a 69 y.o. y/o female who comes in for follow-up after being discharged from the hospital.  The patient was found to have a bleeding vessel of the esophagus that was clipped in [place].  The patient has had no further sign of any GI bleeding.  The patient has been told to contact me if she has any signs of black stools or bloody stools.  She appears stable since discharge and I do not recommend any further intervention at this time.     Lucilla Lame, MD. [name]    Note: This dictation was prepared with Dragon dictation along with smaller phrase technology. Any transcriptional errors that result from this process are unintentional.

## 2019-03-11 NOTE — Progress Notes (Signed)
[facility]  [address], [address] [place], [place] [address] Phone: [phone]  Fax: [phone]   Clinic Day:  03/14/2019  Referring physician: Ellamae Sia, MD  Chief Complaint: Alicia Holder is a 69 y.o. female with h/o carcinoid tumor, secondary polycythemia, iron deficiency s/p GI bleed and B12 deficiency who is seen for a 1 month assessment.  HPI: The patient was last seen in the hematology clinic on 02/14/2019. At that time, she was feeling better.  She denied any melena or hematochezia.  Exam was stable. Hematocrit was 32.7 and hemoglobin 10.0.  Ferritin was 11 with an iron saturation of 4% and a TIBC of 434.  TSH was 0.284 (low ) and free T4 0.81 (normal). Folate was 22.0. Chromogranin was 926.7 on 02/13/2019.  She received a B12 injection.  24 hour urine for 5HIAA was requested.  She received weekly Venofer x 3 (02/18/2019 - 03/04/2019).   CBC on 03/04/2019 revealed a hematocrit 36.9, hemoglobin 11.0, MCV 88.3, platelets 148,000, WBC 5,900.  She was seen by Dr. Allen Norris on 03/08/2019. The patient denied any black stools or bloody stools. She denied any abdominal pain, nausea, vomiting, fevers or chills. She had no further signs of any GI bleeding.  She was to follow-up if she had recurrent back or bloody stools.   During the interim, she has felt better. She notes trying to lose weight. Her goal weight is 145 pounds. She has not had a cigarette in 73 days. She reports running out of Protonix and Carafate. I contacted Dr. Allen Norris and he clarified medication and dosage during visit. Patient understood.   She denies any B symptoms. She denies any black or bloody stool. Patient is eating well. She is not eating greasy fried foods or spicy foods. She has chronic right knee pain. She is on 3 liters of oxygen vi nasal cannula.   Patient agreed to a 24 hour urine test for 5HIAA and will turn it in on 03/18/2019. She will have an eye appointment in 04/2019.      Her BP was 150/86 (repeat 150/86) in the clinic today.   Past Medical History:  Diagnosis Date  . AAA (abdominal aortic aneurysm) ([place])   . Colon cancer ([place])   . Hypertension     Past Surgical History:  Procedure Laterality Date  . ABDOMINAL HYSTERECTOMY    . APPENDECTOMY    . ESOPHAGOGASTRODUODENOSCOPY N/A 01/12/2019   Procedure: ESOPHAGOGASTRODUODENOSCOPY (EGD);  Surgeon: Virgel Manifold, MD;  Location: [facility] ENDOSCOPY;  Service: Endoscopy;  Laterality: N/A;  . ESOPHAGOGASTRODUODENOSCOPY (EGD) WITH PROPOFOL N/A 01/14/2019   Procedure: ESOPHAGOGASTRODUODENOSCOPY (EGD) WITH PROPOFOL;  Surgeon: [name], MD;  Location: [facility];  Service: Gastroenterology;  Laterality: N/A;  . HEMOSTASIS CLIP PLACEMENT  01/14/2019   Procedure: HEMOSTASIS CLIP PLACEMENT;  Surgeon: [name], MD;  Location: [facility] ENDOSCOPY;  Service: Gastroenterology;;    Family History  Problem Relation Age of Onset  . CAD Mother   . CAD Father     Social History:  reports that she quit smoking about 2 months ago. She has a 41.00 pack-year smoking history. She has never used smokeless tobacco. She reports current alcohol use. She reports that she does not use drugs.The patient is accompanied by her daughter on the ipad today.  Allergies:  Allergies  Allergen Reactions  . Aspirin     Current Medications: Current Outpatient Medications  Medication Sig Dispense Refill  . acetaminophen (TYLENOL) 500 MG tablet Take 1,000 mg by  mouth every 6 (six) hours as needed for mild pain.    Marland Kitchen albuterol (VENTOLIN HFA) 108 (90 Base) MCG/ACT inhaler Inhale 1-2 puffs into the lungs every 4 (four) hours as needed for wheezing or shortness of breath.     Marland Kitchen alendronate (FOSAMAX) 70 MG tablet Take 70 mg by mouth once a week.    Marland Kitchen buPROPion (WELLBUTRIN XL) 150 MG 24 hr tablet Take 150 mg by mouth daily.     Marland Kitchen docusate sodium (COLACE) 100 MG capsule Take 100 mg by mouth daily.    Marland Kitchen ipratropium-albuterol (DUONEB)  0.5-2.5 (3) MG/3ML SOLN Take 3 mLs by nebulization every 4 (four) hours as needed. 360 mL 0  . lisinopril (ZESTRIL) 20 MG tablet Take 20 mg by mouth daily.     . montelukast (SINGULAIR) 10 MG tablet Take 10 mg by mouth at bedtime.    Marland Kitchen oxyCODONE (OXY IR/ROXICODONE) 5 MG immediate release tablet Take 5 mg by mouth 3 (three) times daily as needed for moderate pain or breakthrough pain.     Marland Kitchen sertraline (ZOLOFT) 50 MG tablet Take 100 mg by mouth daily.     . simvastatin (ZOCOR) 20 MG tablet Take 20 mg by mouth daily.    . TRELEGY ELLIPTA 100-62.5-25 MCG/INH AEPB Inhale 1 puff into the lungs daily.      No current facility-administered medications for this visit.    Review of Systems  Constitutional: Negative for chills, diaphoresis, fever, malaise/fatigue and weight loss (up 4 pounds; goal weight 145 lbs).       Feels "better".   HENT: Negative.  Negative for congestion, ear pain, hearing loss, nosebleeds, sinus pain and sore throat.   Eyes: Negative.  Negative for blurred vision and double vision.  Respiratory: Negative.  Negative for cough, hemoptysis and shortness of breath.        On 3 liters/min of oxygen.  Cardiovascular: Negative.  Negative for chest pain, palpitations, orthopnea and PND.  Gastrointestinal: Negative.  Negative for abdominal pain, blood in stool, constipation, diarrhea, melena, nausea and vomiting.       Eating well. Soft diet. No spicy or greasy fried food.  Genitourinary: Negative.  Negative for dysuria, frequency, hematuria and urgency.  Musculoskeletal: Positive for joint pain (chronic right knee pain). Negative for back pain, myalgias and neck pain.  Skin: Negative.  Negative for rash.  Neurological: Negative.  Negative for dizziness, tingling, sensory change, focal weakness, weakness and headaches.  Endo/Heme/Allergies: Negative.  Does not bruise/bleed easily.  Psychiatric/Behavioral: Negative.  Negative for depression and memory loss. The patient is not  nervous/anxious and does not have insomnia.   All other systems reviewed and are negative.  Performance status (ECOG):  1  Vitals Blood pressure (!) 155/91, pulse 70, temperature (!) 96.6 F (35.9 C), temperature source Tympanic, resp. rate 18, weight 157 lb 8.3 oz (71.5 kg), SpO2 97 %.   Physical Exam  Constitutional: She is oriented to person, place, and time. She appears well-developed and well-nourished. No distress.  She has a portable oxygen tank by her side.  HENT:  Head: Normocephalic and atraumatic.  Mouth/Throat: Oropharynx is clear and moist. No oropharyngeal exudate.  Auburn/brown styled hair. Edentulous.  Cumming in place.  Mask.  Eyes: Pupils are equal, round, and reactive to light. Conjunctivae and EOM are normal. No scleral icterus.  Blue eyes.  Neck: No JVD present.  Cardiovascular: Normal rate, regular rhythm and normal heart sounds.  No murmur heard. Pulmonary/Chest: Effort normal and breath sounds normal. No  respiratory distress. She has no wheezes. She has no rales.  Abdominal: Soft. Bowel sounds are normal. She exhibits no distension and no mass. There is no hepatosplenomegaly. There is no abdominal tenderness. There is no rebound and no guarding.  Musculoskeletal:        General: No edema. Normal range of motion.     Cervical back: Normal range of motion and neck supple.  Lymphadenopathy:       Head (right side): No preauricular, no posterior auricular and no occipital adenopathy present.       Head (left side): No preauricular, no posterior auricular and no occipital adenopathy present.    She has no cervical adenopathy.    She has no axillary adenopathy.       Right: No inguinal and no supraclavicular adenopathy present.       Left: No inguinal and no supraclavicular adenopathy present.  Neurological: She is alert and oriented to person, place, and time.  Skin: Skin is warm and dry. She is not diaphoretic. No pallor.  Nicotine stained nails.  Psychiatric: She  has a normal mood and affect. Her behavior is normal. Judgment and thought content normal.  Nursing note and vitals reviewed.   Appointment on 03/14/2019  Component Date Value Ref Range Status  . WBC 03/14/2019 6.6  4.0 - 10.5 K/uL Final  . RBC 03/14/2019 4.13  3.87 - 5.11 MIL/uL Final  . Hemoglobin 03/14/2019 11.0* 12.0 - 15.0 g/dL Final  . HCT 03/14/2019 37.2  36.0 - 46.0 % Final  . MCV 03/14/2019 90.1  80.0 - 100.0 fL Final  . MCH 03/14/2019 26.6  26.0 - 34.0 pg Final  . MCHC 03/14/2019 29.6* 30.0 - 36.0 g/dL Final  . RDW 03/14/2019 19.9* 11.5 - 15.5 % Final  . Platelets 03/14/2019 214  150 - 400 K/uL Final  . nRBC 03/14/2019 0.0  0.0 - 0.2 % Final  . Neutrophils Relative % 03/14/2019 59  % Final  . Neutro Abs 03/14/2019 3.9  1.7 - 7.7 K/uL Final  . Lymphocytes Relative 03/14/2019 29  % Final  . Lymphs Abs 03/14/2019 1.9  0.7 - 4.0 K/uL Final  . Monocytes Relative 03/14/2019 9  % Final  . Monocytes Absolute 03/14/2019 0.6  0.1 - 1.0 K/uL Final  . Eosinophils Relative 03/14/2019 2  % Final  . Eosinophils Absolute 03/14/2019 0.1  0.0 - 0.5 K/uL Final  . Basophils Relative 03/14/2019 1  % Final  . Basophils Absolute 03/14/2019 0.0  0.0 - 0.1 K/uL Final  . Immature Granulocytes 03/14/2019 0  % Final  . 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 She has a 60+ pack year smoking history. She denies sleep apnea.   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 Last colonoscopywas 10/[phone] per patient). She has lost 120 pounds in 3 years with portion control and food choices.  Work-upon 01/04/2017 revealed a hematocrit of 50.8, hemoglobin 17, MCV 94.7, platelets 173,000, white count 7400 with an ANC of 5100. Differential was normal. Normal labs included CMP,  epo level (10.7), ferritin (27), with an iron saturation (6%), JAK2 V617F and exons 12-15 were negative. BCR-ABL testing was negative for e1a2 (p190), e13a2 (b2a2, p210) and e14a2 (b3a2, p210) fusion transcripts. Carbon monoxide levelwas 17% (<9% smokers).  Low dose chest CT on 03/16/2017 was lung-RADS 2, benign appearance or behavior. There was left main and two-vessel coronary atherosclerosis. There was a 4.2 cm ascending thoracic aortic aneurysmwith recommendation for annual imaging followup by CTA or MRA. 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 There was a multinodular goiter. Suspected dominant 4.5 cm inferior left thyroid lobe nodule. There was a small to moderate hiatal hernia. Low dose chest CTon 03/20/2018 revealed benign appearance or behavior. Lung-RADS score was 2.  Thyroid ultrasound on 04/03/2017 revealed 7 nodules. Left mid thyroid nodule (labeled 4) met criteria for biopsy. The left inferior thyroid nodule (labeled 7) would meet criteria for biopsy, however, was favored to be a pseudo nodule and extension of thyroid goiter into the mediastinum given the appearance on prior CT. Left thyroid nodules (labeled 5 and 6) both met criteria for surveillance. None of the right-sided nodules met criteria for surveillance or biopsy.Thyroid ultrasoundon 09/10/2018 revealed thyromegaly with bilateral nodules, stable. None metcriteria for biopsy. Recommended annual/biennial ultrasound follow-up   FNA thyroid biopsy x 2 on 04/20/2017 of the left mid thyroid nodule and left inferior thyroid nodule were benign (Bethesda category II) and c/w benign follicular nodule.   She began a phlebotomy programon 02/08/2017 (last 10/12/2018). She undergoes small volume phlebotomy (300 cc)if her hematocrit is >47.  She was admitted to [facility] from 01/11/2019 - 01/13/2019 then transferred to [facility] from 01/13/2019 - 01/18/2019 for GI bleeding.  Hemoglobin was 5.9.  EGD on 01/12/2019  revealed red blood in the distal esophagus and large blood clots in the gastric fundus. Hemospray was used to treat the distal esophagus and gastric fundus.  She received 6 units of PRBCs.  She received a Protonix drip. She was transferred to [facility] for vascular intervention.  EGD on 01/14/2019 revealed severe (LA grade D) esophagitis with bleeding.  There were esophageal ulcers with active bleeding.  Hemostatic clip was placed.There was erythematous mucosa in the cardia, gastric fundus, gastric body, incisura, antrum, and prepyloric region of the stomach and pylorus.  She received 1 unit of PRBCs on 01/17/2019.  She has iron deficiency anemia s/p massive GI bleeding.  Labs on 01/11/2019 revealed a ferritin 26 with an iron saturation 12% with a TIBC 247.  B12 was 173 (low) and folate 10.3 (normal).  Hemolysis evaluation (Coombs, haptoglobin, LDH) was normal.  Retic was 4.8%.  She received weekly Venofer x 3 (02/18/2019 - 03/04/2019).   She has B12 deficiency.  B12 was 173 on 01/11/2019.  She receives monthly B12 injections (began 01/14/2019; last 02/14/2019).  She has a history of carcinoid tumor.  Chromogranin was 926.7 (0-101.8) on 02/13/2019.  Symptomatically, she feels better.  She has not smoked in 73 days.  Exam is stable.  Plan: 1.   Labs today: CBC with diff. 2.   Secondary erythrocytosis Patient no longer has erythrocytosis secondary to GI bleed.  She has also stopped smoking. Hematocrit 32.7  Hemoglobin 10.0.  MCV 89.1 on 02/13/2019.  Hematocrit 37.2  Hemoglobin 11.0.  MCV 90.1 on 03/14/2019.  Continue to monitor, 3.  GI bleed             EGD on 01/14/2019 revealed severe esophagitis with ulcers and active bleeding.             Phone follow-up with Dr Allen Norris re: Protonix and Carafate.  She completed a course of Carafate and remains on Protonix.             Refill Protonix.  She is  due for a repeat upper endoscopy.                         Patient wishes  to stay local rather than to go to [facility]. 4.   Iron deficiency             Hematocrit 37.3  Hemoglobin 11.0.  MCV 90.1 on 03/14/2019.             Ferritin 11 on 02/14/2019.             She completed Venofer x 3 on 03/04/2019.  Continue to monitor. 5.   B12 deficiency             B12 was 173 on 01/11/2019.             She received B12 on 02/14/2019.             B12 today and monthly x 6. 6.  History of carcinoid tumor             Chromogranin was 926.7 (high) on 02/13/2019.  Unclear if elevation due to recurrent carcinoid, chronic atrophic gastritis or PPI.  Patient to turn in 24 hour urine for 5HIAA.   RN to call patient with results.  Consider octreotide scan vs DOTATE PET scan. 7.   Thyroid nodules Thyroid ultrasoundon 09/10/2018 revealed stable bilateral thyroid nodules. Continue to monitor. 8.Weight loss Weight up 4 pounds. Patient confirms plan to lose weight.   Goal weight 145 pounds. Continue to monitor. 9.     RTC in 2 months for labs (CBC with diff, ferritin, chromogranin) and B12. 10.   RTC in 4 months for MD assessment, labs (CBC with diff, ferritin, iron studies- day before), B12 and +/- Venofer.  I discussed the assessment and treatment plan with the patient.  The patient was provided an opportunity to ask questions and all were answered.  The patient agreed with the plan and demonstrated an understanding of the instructions.  The patient was advised to call back if the symptoms worsen or if the condition fails to improve as anticipated.  I provided 18 minutes of face-to-face time during this this encounter and > 50% was spent counseling as documented under my assessment and plan.  An additional 10-12 minutes were spent reviewing her chart (Epic and Care Everywhere) including notes, labs, and imaging studies.  Dr Allen Norris was contacted.  Lequita Asal, MD, PhD    03/14/2019, 3:01 PM  I, Selena Batten, am acting as scribe for [employer]. Mike Gip, MD, PhD.  I, Kiriana Worthington C. Mike Gip, MD, have reviewed the above documentation for accuracy and completeness, and I agree with the above.

## 2019-03-13 ENCOUNTER — Other Ambulatory Visit: Payer: Self-pay | Admitting: Internal Medicine

## 2019-03-13 ENCOUNTER — Other Ambulatory Visit: Payer: Self-pay

## 2019-03-13 NOTE — Progress Notes (Signed)
Confirmed name and DOB. Denies any concerns.  

## 2019-03-14 ENCOUNTER — Inpatient Hospital Stay: Payer: Medicare HMO

## 2019-03-14 ENCOUNTER — Inpatient Hospital Stay (HOSPITAL_BASED_OUTPATIENT_CLINIC_OR_DEPARTMENT_OTHER): Payer: Medicare HMO | Admitting: Hematology and Oncology

## 2019-03-14 ENCOUNTER — Encounter: Payer: Self-pay | Admitting: Hematology and Oncology

## 2019-03-14 VITALS — BP 150/86 | HR 70 | Temp 96.6°F | Resp 18 | Wt 157.5 lb

## 2019-03-14 DIAGNOSIS — K228 Other specified diseases of esophagus: Secondary | ICD-10-CM | POA: Diagnosis not present

## 2019-03-14 DIAGNOSIS — D500 Iron deficiency anemia secondary to blood loss (chronic): Secondary | ICD-10-CM | POA: Diagnosis not present

## 2019-03-14 DIAGNOSIS — Z85110 Personal history of malignant carcinoid tumor of bronchus and lung: Secondary | ICD-10-CM

## 2019-03-14 DIAGNOSIS — D751 Secondary polycythemia: Secondary | ICD-10-CM

## 2019-03-14 DIAGNOSIS — E538 Deficiency of other specified B group vitamins: Secondary | ICD-10-CM | POA: Diagnosis not present

## 2019-03-14 DIAGNOSIS — R634 Abnormal weight loss: Secondary | ICD-10-CM | POA: Insufficient documentation

## 2019-03-14 DIAGNOSIS — K2289 Other specified disease of esophagus: Secondary | ICD-10-CM

## 2019-03-14 DIAGNOSIS — D509 Iron deficiency anemia, unspecified: Secondary | ICD-10-CM | POA: Diagnosis not present

## 2019-03-14 LAB — CBC WITH DIFFERENTIAL/PLATELET
Abs Immature Granulocytes: 0.02 10*3/uL (ref 0.00–0.07)
Basophils Absolute: 0 10*3/uL (ref 0.0–0.1)
Basophils Relative: 1 %
Eosinophils Absolute: 0.1 10*3/uL (ref 0.0–0.5)
Eosinophils Relative: 2 %
HCT: 37.2 % (ref 36.0–46.0)
Hemoglobin: 11 g/dL — ABNORMAL LOW (ref 12.0–15.0)
Immature Granulocytes: 0 %
Lymphocytes Relative: 29 %
Lymphs Abs: 1.9 10*3/uL (ref 0.7–4.0)
MCH: 26.6 pg (ref 26.0–34.0)
MCHC: 29.6 g/dL — ABNORMAL LOW (ref 30.0–36.0)
MCV: 90.1 fL (ref 80.0–100.0)
Monocytes Absolute: 0.6 10*3/uL (ref 0.1–1.0)
Monocytes Relative: 9 %
Neutro Abs: 3.9 10*3/uL (ref 1.7–7.7)
Neutrophils Relative %: 59 %
Platelets: 214 10*3/uL (ref 150–400)
RBC: 4.13 MIL/uL (ref 3.87–5.11)
RDW: 19.9 % — ABNORMAL HIGH (ref 11.5–15.5)
WBC: 6.6 10*3/uL (ref 4.0–10.5)
nRBC: 0 % (ref 0.0–0.2)

## 2019-03-14 MED ORDER — PANTOPRAZOLE SODIUM 40 MG PO TBEC: 40.0000 mg | DELAYED_RELEASE_TABLET | Freq: Every day | ORAL | 0 refills | Status: DC

## 2019-03-14 MED ORDER — CYANOCOBALAMIN 1000 MCG/ML IJ SOLN
1000.0000 ug | Freq: Once | INTRAMUSCULAR | Status: AC
  Administered 2019-03-14: 15:00:00 1000 ug via INTRAMUSCULAR

## 2019-03-14 NOTE — Progress Notes (Signed)
No new changes noted today 

## 2019-03-14 NOTE — Patient Instructions (Signed)

## 2019-03-17 DIAGNOSIS — D509 Iron deficiency anemia, unspecified: Secondary | ICD-10-CM | POA: Diagnosis not present

## 2019-03-21 ENCOUNTER — Telehealth: Payer: Self-pay | Admitting: *Deleted

## 2019-03-21 NOTE — Telephone Encounter (Signed)
(  03/21/2019) Left message for patient to notify them that it is time to schedule annual low dose lung cancer screening CT scan. Instructed patient to call back to verify information prior to the scan being scheduled SRW

## 2019-03-23 LAB — 5 HIAA, QUANTITATIVE, URINE, 24 HOUR
5-HIAA, Ur: 1.5 mg/L
5-HIAA,Quant.,24 Hr Urine: 2.4 mg/24 hr (ref 0.0–14.9)
Total Volume: 1600

## 2019-04-11 ENCOUNTER — Inpatient Hospital Stay: Payer: Medicare HMO | Attending: Nurse Practitioner

## 2019-04-11 ENCOUNTER — Other Ambulatory Visit: Payer: Self-pay

## 2019-04-11 VITALS — BP 169/95 | HR 70 | Temp 96.7°F | Resp 20

## 2019-04-11 DIAGNOSIS — E538 Deficiency of other specified B group vitamins: Secondary | ICD-10-CM | POA: Diagnosis present

## 2019-04-11 DIAGNOSIS — D751 Secondary polycythemia: Secondary | ICD-10-CM

## 2019-04-11 MED ORDER — CYANOCOBALAMIN 1000 MCG/ML IJ SOLN
1000.0000 ug | Freq: Once | INTRAMUSCULAR | Status: AC
  Administered 2019-04-11: 1000 ug via INTRAMUSCULAR

## 2019-04-11 NOTE — Patient Instructions (Signed)

## 2019-04-15 ENCOUNTER — Telehealth: Payer: Self-pay

## 2019-04-15 DIAGNOSIS — Z87891 Personal history of nicotine dependence: Secondary | ICD-10-CM

## 2019-04-15 NOTE — Telephone Encounter (Signed)
Patient has been notified that lung cancer screening CT scan is due currently or will be in near future.  Confirmed that patient is within the appropriate age range and asymptomatic, (no signs or symptoms of lung cancer).  Patient denies illness that would prevent curative treatment for lung cancer if found.  Verified smoking history (former smoker quit 01/2019 with 41 year history).  Patient is agreeable for CT scan being scheduled.  She prefers afternoon appointments.

## 2019-04-17 ENCOUNTER — Other Ambulatory Visit: Payer: Self-pay | Admitting: Internal Medicine

## 2019-04-17 DIAGNOSIS — N83202 Unspecified ovarian cyst, left side: Secondary | ICD-10-CM

## 2019-04-17 NOTE — Addendum Note (Signed)
Addended by: Lieutenant Diego on: 04/17/2019 12:45 PM   Modules accepted: Orders

## 2019-04-17 NOTE — Telephone Encounter (Signed)
Smoking history: former, quit 2020, 41 pack year

## 2019-04-19 ENCOUNTER — Ambulatory Visit
Admission: RE | Admit: 2019-04-19 | Discharge: 2019-04-19 | Disposition: A | Discharge status: Home / Self Care | Payer: Medicare HMO | Source: Ambulatory Visit | Attending: Oncology | Admitting: Oncology

## 2019-04-19 ENCOUNTER — Other Ambulatory Visit: Payer: Self-pay

## 2019-04-19 DIAGNOSIS — Z87891 Personal history of nicotine dependence: Secondary | ICD-10-CM | POA: Diagnosis present

## 2019-04-22 ENCOUNTER — Ambulatory Visit
Admission: RE | Admit: 2019-04-22 | Discharge: 2019-04-22 | Disposition: A | Discharge status: Home / Self Care | Payer: Medicare HMO | Source: Ambulatory Visit | Attending: Internal Medicine | Admitting: Internal Medicine

## 2019-04-22 ENCOUNTER — Other Ambulatory Visit: Payer: Self-pay

## 2019-04-22 DIAGNOSIS — N83202 Unspecified ovarian cyst, left side: Secondary | ICD-10-CM | POA: Insufficient documentation

## 2019-04-22 NOTE — Progress Notes (Signed)
Bridgett Larsson, NP 04/22/2019 9:03 AM

## 2019-04-23 ENCOUNTER — Telehealth: Payer: Self-pay | Admitting: *Deleted

## 2019-04-23 NOTE — Telephone Encounter (Signed)
Attempted to contact and review lung screening results. However, there is no voicemail or answer.

## 2019-04-24 NOTE — Telephone Encounter (Signed)
Attempted to contact to review results. Unable to connect with patient. Spoke with daughter and will call patient in the morning per her request.

## 2019-04-25 ENCOUNTER — Telehealth: Payer: Self-pay | Admitting: *Deleted

## 2019-04-25 NOTE — Telephone Encounter (Signed)
After discussion with Dr. Mike Gip, Notified patient of LDCT lung cancer screening program results with recommendation for 6 week follow up imaging. Also notified of incidental findings noted below and is encouraged to discuss further with PCP who will receive a copy of this note and/or the CT report. Patient verbalizes understanding.   IMPRESSION: 1. 8.0 mm left upper lobe nodule and 7.2 mm right upper lobe nodule are new from comparison exams, including the most recent study of 01/11/2019, suggesting an infectious or inflammatory etiology. Malignancy certainly cannot be excluded. By size, the largest lesion is characterized as Lung-RADS 4B, suspicious. Nodules are too small for PET resolution. Consider further evaluation with CT chest without contrast in 4-6 weeks and/or Pulmonology or Thoracic Surgery consultation. These results will be called to the ordering clinician or representative by the Radiologist Assistant, and communication documented in the PACS or [employer]. 2. Asymmetrically enlarged and nodular left lobe of the thyroid. Typically, thyroid ultrasound would be recommended. However, the patient did undergo sonographic evaluation and biopsy 04/03/2017 and 04/20/2017, respectively, and may not require further imaging at this time. (Ref: J Am Coll Radiol. 2015 Feb;12(2): 143-50). 3. Ascending Aortic aneurysm NOS (ICD10-I71.9), stable from 03/16/2017. Recommend annual imaging followup by CTA or MRA. This recommendation follows 2010 ACCF/AHA/AATS/ACR/ASA/SCA/SCAI/SIR/STS/SVM Guidelines for the Diagnosis and Management of Patients with Thoracic Aortic Disease. Circulation. 2010; [id number]. Aortic aneurysm NOS (ICD10-I71.9). 4. Aortic atherosclerosis (ICD10-I70.0). Coronary artery calcification. 5.  Emphysema (ICD10-J43.9).

## 2019-05-09 ENCOUNTER — Inpatient Hospital Stay: Payer: Medicare HMO

## 2019-05-09 ENCOUNTER — Other Ambulatory Visit: Payer: Self-pay

## 2019-05-09 ENCOUNTER — Inpatient Hospital Stay: Payer: Medicare HMO | Attending: Nurse Practitioner

## 2019-05-09 VITALS — BP 117/79 | HR 80 | Temp 97.2°F | Resp 18

## 2019-05-09 DIAGNOSIS — D500 Iron deficiency anemia secondary to blood loss (chronic): Secondary | ICD-10-CM | POA: Diagnosis not present

## 2019-05-09 DIAGNOSIS — Z85110 Personal history of malignant carcinoid tumor of bronchus and lung: Secondary | ICD-10-CM | POA: Diagnosis not present

## 2019-05-09 DIAGNOSIS — E538 Deficiency of other specified B group vitamins: Secondary | ICD-10-CM | POA: Diagnosis not present

## 2019-05-09 DIAGNOSIS — D751 Secondary polycythemia: Secondary | ICD-10-CM

## 2019-05-09 DIAGNOSIS — E041 Nontoxic single thyroid nodule: Secondary | ICD-10-CM

## 2019-05-09 LAB — CBC WITH DIFFERENTIAL/PLATELET
Abs Immature Granulocytes: 0.02 10*3/uL (ref 0.00–0.07)
Basophils Absolute: 0.1 10*3/uL (ref 0.0–0.1)
Basophils Relative: 1 %
Eosinophils Absolute: 0.1 10*3/uL (ref 0.0–0.5)
Eosinophils Relative: 1 %
HCT: 37.4 % (ref 36.0–46.0)
Hemoglobin: 11.3 g/dL — ABNORMAL LOW (ref 12.0–15.0)
Immature Granulocytes: 0 %
Lymphocytes Relative: 24 %
Lymphs Abs: 1.4 10*3/uL (ref 0.7–4.0)
MCH: 27.2 pg (ref 26.0–34.0)
MCHC: 30.2 g/dL (ref 30.0–36.0)
MCV: 89.9 fL (ref 80.0–100.0)
Monocytes Absolute: 0.4 10*3/uL (ref 0.1–1.0)
Monocytes Relative: 7 %
Neutro Abs: 4.1 10*3/uL (ref 1.7–7.7)
Neutrophils Relative %: 67 %
Platelets: 171 10*3/uL (ref 150–400)
RBC: 4.16 MIL/uL (ref 3.87–5.11)
RDW: 19.1 % — ABNORMAL HIGH (ref 11.5–15.5)
WBC: 6 10*3/uL (ref 4.0–10.5)
nRBC: 0 % (ref 0.0–0.2)

## 2019-05-09 LAB — FERRITIN: Ferritin: 12 ng/mL (ref 11–307)

## 2019-05-09 MED ORDER — CYANOCOBALAMIN 1000 MCG/ML IJ SOLN
1000.0000 ug | Freq: Once | INTRAMUSCULAR | Status: AC
  Administered 2019-05-09: 1000 ug via INTRAMUSCULAR

## 2019-05-09 NOTE — Patient Instructions (Signed)

## 2019-05-10 LAB — CHROMOGRANIN A: Chromogranin A (ng/mL): 330.8 ng/mL — ABNORMAL HIGH (ref 0.0–101.8)

## 2019-05-22 ENCOUNTER — Encounter: Payer: Self-pay | Admitting: Ophthalmology

## 2019-05-22 ENCOUNTER — Other Ambulatory Visit: Payer: Self-pay

## 2019-05-24 ENCOUNTER — Other Ambulatory Visit
Admission: RE | Admit: 2019-05-24 | Discharge: 2019-05-24 | Disposition: A | Discharge status: Home / Self Care | Payer: Medicare HMO | Source: Ambulatory Visit | Attending: Ophthalmology | Admitting: Ophthalmology

## 2019-05-24 ENCOUNTER — Other Ambulatory Visit: Payer: Self-pay

## 2019-05-24 DIAGNOSIS — Z20822 Contact with and (suspected) exposure to covid-19: Secondary | ICD-10-CM | POA: Diagnosis not present

## 2019-05-24 DIAGNOSIS — Z01812 Encounter for preprocedural laboratory examination: Secondary | ICD-10-CM | POA: Diagnosis present

## 2019-05-24 LAB — SARS CORONAVIRUS 2 (TAT 6-24 HRS): SARS Coronavirus 2: NEGATIVE

## 2019-05-24 NOTE — Discharge Instructions (Signed)

## 2019-05-27 ENCOUNTER — Encounter: Payer: Self-pay | Admitting: Ophthalmology

## 2019-05-28 ENCOUNTER — Encounter
Admission: RE | Disposition: A | Discharge status: Home / Self Care | Payer: Self-pay | Source: Home / Self Care | Attending: Ophthalmology

## 2019-05-28 ENCOUNTER — Ambulatory Visit: Payer: Medicare HMO | Admitting: Anesthesiology

## 2019-05-28 ENCOUNTER — Encounter: Payer: Self-pay | Admitting: Ophthalmology

## 2019-05-28 ENCOUNTER — Ambulatory Visit
Admission: RE | Admit: 2019-05-28 | Discharge: 2019-05-28 | Disposition: A | Discharge status: Home / Self Care | Payer: Medicare HMO | Attending: Ophthalmology | Admitting: Ophthalmology

## 2019-05-28 ENCOUNTER — Other Ambulatory Visit: Payer: Self-pay

## 2019-05-28 DIAGNOSIS — Z7983 Long term (current) use of bisphosphonates: Secondary | ICD-10-CM | POA: Diagnosis not present

## 2019-05-28 DIAGNOSIS — Z79891 Long term (current) use of opiate analgesic: Secondary | ICD-10-CM | POA: Insufficient documentation

## 2019-05-28 DIAGNOSIS — Z9981 Dependence on supplemental oxygen: Secondary | ICD-10-CM | POA: Diagnosis not present

## 2019-05-28 DIAGNOSIS — F329 Major depressive disorder, single episode, unspecified: Secondary | ICD-10-CM | POA: Insufficient documentation

## 2019-05-28 DIAGNOSIS — Z79899 Other long term (current) drug therapy: Secondary | ICD-10-CM | POA: Diagnosis not present

## 2019-05-28 DIAGNOSIS — J45909 Unspecified asthma, uncomplicated: Secondary | ICD-10-CM | POA: Diagnosis not present

## 2019-05-28 DIAGNOSIS — K219 Gastro-esophageal reflux disease without esophagitis: Secondary | ICD-10-CM | POA: Diagnosis not present

## 2019-05-28 DIAGNOSIS — H2512 Age-related nuclear cataract, left eye: Secondary | ICD-10-CM | POA: Diagnosis not present

## 2019-05-28 DIAGNOSIS — I10 Essential (primary) hypertension: Secondary | ICD-10-CM | POA: Diagnosis not present

## 2019-05-28 DIAGNOSIS — J439 Emphysema, unspecified: Secondary | ICD-10-CM | POA: Diagnosis not present

## 2019-05-28 DIAGNOSIS — M810 Age-related osteoporosis without current pathological fracture: Secondary | ICD-10-CM | POA: Diagnosis not present

## 2019-05-28 DIAGNOSIS — Z7951 Long term (current) use of inhaled steroids: Secondary | ICD-10-CM | POA: Insufficient documentation

## 2019-05-28 DIAGNOSIS — E538 Deficiency of other specified B group vitamins: Secondary | ICD-10-CM | POA: Diagnosis not present

## 2019-05-28 DIAGNOSIS — E7800 Pure hypercholesterolemia, unspecified: Secondary | ICD-10-CM | POA: Diagnosis not present

## 2019-05-28 DIAGNOSIS — Z85030 Personal history of malignant carcinoid tumor of large intestine: Secondary | ICD-10-CM | POA: Insufficient documentation

## 2019-05-28 DIAGNOSIS — I712 Thoracic aortic aneurysm, without rupture: Secondary | ICD-10-CM | POA: Diagnosis not present

## 2019-05-28 HISTORY — DX: Unspecified asthma, uncomplicated: J45.909

## 2019-05-28 HISTORY — DX: Sleep apnea, unspecified: G47.30

## 2019-05-28 HISTORY — DX: Unspecified osteoarthritis, unspecified site: M19.90

## 2019-05-28 HISTORY — DX: Chronic obstructive pulmonary disease, unspecified: J44.9

## 2019-05-28 HISTORY — PX: CATARACT EXTRACTION W/PHACO: SHX586

## 2019-05-28 HISTORY — DX: Gastro-esophageal reflux disease without esophagitis: K21.9

## 2019-05-28 SURGERY — PHACOEMULSIFICATION, CATARACT, WITH IOL INSERTION
Anesthesia: Monitor Anesthesia Care | Site: Eye | Laterality: Left

## 2019-05-28 MED ORDER — BRIMONIDINE TARTRATE-TIMOLOL 0.2-0.5 % OP SOLN
OPHTHALMIC | Status: DC | PRN
  Administered 2019-05-28: 1 [drp] via OPHTHALMIC

## 2019-05-28 MED ORDER — FENTANYL CITRATE (PF) 100 MCG/2ML IJ SOLN
INTRAMUSCULAR | Status: DC | PRN
  Administered 2019-05-28: 50 ug via INTRAVENOUS

## 2019-05-28 MED ORDER — MOXIFLOXACIN HCL 0.5 % OP SOLN
OPHTHALMIC | Status: DC | PRN
  Administered 2019-05-28: 0.2 mL via OPHTHALMIC

## 2019-05-28 MED ORDER — TRYPAN BLUE 0.06 % OP SOLN
OPHTHALMIC | Status: DC | PRN
  Administered 2019-05-28: 0.5 mL via INTRAOCULAR

## 2019-05-28 MED ORDER — TETRACAINE HCL 0.5 % OP SOLN
1.0000 [drp] | OPHTHALMIC | Status: DC | PRN
  Administered 2019-05-28 (×3): 1 [drp] via OPHTHALMIC

## 2019-05-28 MED ORDER — ARMC OPHTHALMIC DILATING DROPS
1.0000 "application " | OPHTHALMIC | Status: DC | PRN
  Administered 2019-05-28 (×2): 1 via OPHTHALMIC

## 2019-05-28 MED ORDER — LIDOCAINE HCL (PF) 2 % IJ SOLN
INTRAOCULAR | Status: DC | PRN
  Administered 2019-05-28: 2 mL

## 2019-05-28 MED ORDER — OXYCODONE HCL 5 MG PO TABS: 5.0000 mg | ORAL_TABLET | Freq: Once | ORAL | Status: DC | PRN

## 2019-05-28 MED ORDER — MIDAZOLAM HCL 2 MG/2ML IJ SOLN
INTRAMUSCULAR | Status: DC | PRN
  Administered 2019-05-28: 1 mg via INTRAVENOUS

## 2019-05-28 MED ORDER — EPINEPHRINE PF 1 MG/ML IJ SOLN
INTRAOCULAR | Status: DC | PRN
  Administered 2019-05-28: 82 mL via OPHTHALMIC

## 2019-05-28 MED ORDER — OXYCODONE HCL 5 MG/5ML PO SOLN: 5.0000 mg | Freq: Once | ORAL | Status: DC | PRN

## 2019-05-28 MED ORDER — NA CHONDROIT SULF-NA HYALURON 40-17 MG/ML IO SOLN
INTRAOCULAR | Status: DC | PRN
  Administered 2019-05-28: 1 mL via INTRAOCULAR

## 2019-05-28 SURGICAL SUPPLY — 21 items
CANNULA ANT/CHMB 27G (MISCELLANEOUS) ×2 IMPLANT
CANNULA ANT/CHMB 27GA (MISCELLANEOUS) ×6 IMPLANT
DISSECTOR HYDRO NUCLEUS 50X22 (MISCELLANEOUS) ×3 IMPLANT
GLOVE SURG LX 8.0 MICRO (GLOVE) ×2
GLOVE SURG LX STRL 8.0 MICRO (GLOVE) ×1 IMPLANT
GLOVE SURG TRIUMPH 8.0 PF LTX (GLOVE) ×3 IMPLANT
GOWN STRL REUS W/ TWL LRG LVL3 (GOWN DISPOSABLE) ×2 IMPLANT
GOWN STRL REUS W/TWL LRG LVL3 (GOWN DISPOSABLE) ×6
LENS IOL TECNIS ITEC 18.0 (Intraocular Lens) ×2 IMPLANT
MARKER SKIN DUAL TIP RULER LAB (MISCELLANEOUS) ×3 IMPLANT
NDL FILTER BLUNT 18X1 1/2 (NEEDLE) ×1 IMPLANT
NEEDLE FILTER BLUNT 18X 1/2SAF (NEEDLE) ×2
NEEDLE FILTER BLUNT 18X1 1/2 (NEEDLE) ×1 IMPLANT
PACK EYE AFTER SURG (MISCELLANEOUS) ×3 IMPLANT
PACK OPTHALMIC (MISCELLANEOUS) ×3 IMPLANT
PACK PORFILIO (MISCELLANEOUS) ×3 IMPLANT
RING MALYGIN (MISCELLANEOUS) ×2 IMPLANT
SYR 3ML LL SCALE MARK (SYRINGE) ×3 IMPLANT
SYR TB 1ML LUER SLIP (SYRINGE) ×3 IMPLANT
WATER STERILE IRR 250ML POUR (IV SOLUTION) ×3 IMPLANT
WIPE NON LINTING 3.25X3.25 (MISCELLANEOUS) ×3 IMPLANT

## 2019-05-28 NOTE — Anesthesia Procedure Notes (Signed)
Procedure Name: MAC Performed by: Antinio Sanderfer, CRNA Pre-anesthesia Checklist: Patient identified, Emergency Drugs available, Suction available, Timeout performed and Patient being monitored Patient Re-evaluated:Patient Re-evaluated prior to induction Oxygen Delivery Method: Nasal cannula Placement Confirmation: positive ETCO2       

## 2019-05-28 NOTE — Anesthesia Preprocedure Evaluation (Signed)
Anesthesia Evaluation  Patient identified by MRN, date of birth, ID band Patient awake    Reviewed: NPO status   Airway Mallampati: II  TM Distance: >3 FB Neck ROM: full    Dental  (+) Poor Dentition, Missing   Pulmonary neg pulmonary ROS, asthma , neg sleep apnea, COPD (3L [place]),  COPD inhaler and oxygen dependent, former smoker (quit 5 mos ago.),    Pulmonary exam normal        Cardiovascular Exercise Tolerance: Good hypertension, Normal cardiovascular exam  AAA (abdominal aortic aneurysm) : stable   Neuro/Psych Anxiety Chronic pain: knees    GI/Hepatic Neg liver ROS, GERD  Controlled,  Endo/Other  negative endocrine ROS  Renal/GU negative Renal ROS  negative genitourinary   Musculoskeletal  (+) Arthritis ,   Abdominal   Peds  Hematology  (+) Blood dyscrasia (Secondary erythrocytosis), anemia , onc stable: Lequita Asal, MD at 04/14/2019  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    Anesthesia Other Findings Covid: NEG.  Reproductive/Obstetrics                             Anesthesia Physical Anesthesia Plan  ASA: III  Anesthesia Plan: MAC   Post-op Pain Management:    Induction:   PONV Risk Score and Plan: 2 and TIVA and Midazolam  Airway Management Planned:   Additional Equipment:   Intra-op Plan:   Post-operative Plan:   Informed Consent: I have reviewed the patients History and Physical, chart, labs and discussed the procedure including the risks, benefits and alternatives for the proposed anesthesia with the patient or authorized representative who has indicated his/her understanding and acceptance.       Plan Discussed with: CRNA  Anesthesia Plan Comments:         Anesthesia Quick Evaluation

## 2019-05-28 NOTE — H&P (Signed)
All labs reviewed. Abnormal studies sent to patients PCP when indicated.  Previous H&P reviewed, patient examined, there are NO CHANGES.  Alicia Barrales Porfilio4/27/202111:40 AM

## 2019-05-28 NOTE — Anesthesia Postprocedure Evaluation (Signed)
Anesthesia Post Note  Patient: Alicia Holder  Procedure(s) Performed: CATARACT EXTRACTION PHACO AND INTRAOCULAR LENS PLACEMENT (IOC) LEFT VISION BLUE 31.19 02:18.2 (Left Eye)     Patient location during evaluation: PACU Anesthesia Type: MAC Level of consciousness: awake and alert Pain management: pain level controlled Vital Signs Assessment: post-procedure vital signs reviewed and stable Respiratory status: spontaneous breathing, nonlabored ventilation, respiratory function stable and patient connected to nasal cannula oxygen Cardiovascular status: stable and blood pressure returned to baseline Postop Assessment: no apparent nausea or vomiting Anesthetic complications: no    Antonios Ostrow

## 2019-05-28 NOTE — Transfer of Care (Signed)
Immediate Anesthesia Transfer of Care Note  Patient: Alicia Holder  Procedure(s) Performed: CATARACT EXTRACTION PHACO AND INTRAOCULAR LENS PLACEMENT (IOC) LEFT VISION BLUE 31.19 02:18.2 (Left Eye)  Patient Location: PACU  Anesthesia Type: MAC  Level of Consciousness: awake, alert  and patient cooperative  Airway and Oxygen Therapy: Patient Spontanous Breathing and Patient connected to supplemental oxygen  Post-op Assessment: Post-op Vital signs reviewed, Patient's Cardiovascular Status Stable, Respiratory Function Stable, Patent Airway and No signs of Nausea or vomiting  Post-op Vital Signs: Reviewed and stable  Complications: No apparent anesthesia complications

## 2019-05-28 NOTE — Op Note (Signed)
PREOPERATIVE DIAGNOSIS:  Nuclear sclerotic cataract of the left eye.   POSTOPERATIVE DIAGNOSIS:  Nuclear sclerotic cataract of the left eye.   OPERATIVE PROCEDURE:@   SURGEON:  Birder Robson, MD.   ANESTHESIA:  Anesthesiologist: Fidel Levy, MD CRNA: Mayme Genta, CRNA; Cameron Ali, CRNA  1.      Managed anesthesia care. 2.     0.22ml of Shugarcaine was instilled following the paracentesis   COMPLICATIONS:  None.   TECHNIQUE:   Stop and chop   DESCRIPTION OF PROCEDURE:  The patient was examined and consented in the preoperative holding area where the aforementioned topical anesthesia was applied to the left eye and then brought back to the Operating Room where the left eye was prepped and draped in the usual sterile ophthalmic fashion and a lid speculum was placed. A paracentesis was created with the side port blade and the anterior chamber was filled with viscoelastic. A near clear corneal incision was performed with the steel keratome. A continuous curvilinear capsulorrhexis was performed with a cystotome followed by the capsulorrhexis forceps. Hydrodissection and hydrodelineation were carried out with BSS on a blunt cannula. The lens was removed in a stop and chop  technique and the remaining cortical material was removed with the irrigation-aspiration handpiece. The capsular bag was inflated with viscoelastic and the Technis [id number] lens was placed in the capsular bag without complication. The remaining viscoelastic was removed from the eye with the irrigation-aspiration handpiece. The wounds were hydrated. The anterior chamber was flushed with BSS and the eye was inflated to physiologic pressure. 0.57ml Vigamox was placed in the anterior chamber. The wounds were found to be water tight. The eye was dressed with Combigan. The patient was given protective glasses to wear throughout the day and a shield with which to sleep tonight. The patient was also given drops with which to begin a  drop regimen today and will follow-up with me in one day. Implant Name Type Inv. Item Serial No. Manufacturer Lot No. LRB No. Used Action  LENS IOL DIOP 18.0 - [id number] Intraocular Lens LENS IOL DIOP 18.0 [id number]  Left 1 Implanted    Procedure(s): CATARACT EXTRACTION PHACO AND INTRAOCULAR LENS PLACEMENT (IOC) LEFT VISION BLUE 31.19 02:18.2 (Left)  Electronically signed: Birder Robson 05/28/2019 12:10 PM

## 2019-05-29 ENCOUNTER — Encounter: Payer: Self-pay | Admitting: *Deleted

## 2019-05-30 ENCOUNTER — Other Ambulatory Visit: Payer: Self-pay | Admitting: Hematology and Oncology

## 2019-06-06 ENCOUNTER — Inpatient Hospital Stay: Payer: Medicare HMO | Attending: Nurse Practitioner

## 2019-06-06 ENCOUNTER — Other Ambulatory Visit: Payer: Self-pay

## 2019-06-06 VITALS — BP 117/80 | HR 76 | Temp 97.5°F | Resp 20

## 2019-06-06 DIAGNOSIS — E538 Deficiency of other specified B group vitamins: Secondary | ICD-10-CM | POA: Insufficient documentation

## 2019-06-06 DIAGNOSIS — D751 Secondary polycythemia: Secondary | ICD-10-CM

## 2019-06-06 MED ORDER — CYANOCOBALAMIN 1000 MCG/ML IJ SOLN
1000.0000 ug | Freq: Once | INTRAMUSCULAR | Status: AC
  Administered 2019-06-06: 15:00:00 1000 ug via INTRAMUSCULAR

## 2019-06-06 NOTE — Patient Instructions (Signed)

## 2019-06-10 ENCOUNTER — Encounter: Payer: Self-pay | Admitting: Obstetrics and Gynecology

## 2019-06-10 ENCOUNTER — Ambulatory Visit (INDEPENDENT_AMBULATORY_CARE_PROVIDER_SITE_OTHER): Payer: Medicare HMO | Admitting: Obstetrics and Gynecology

## 2019-06-10 ENCOUNTER — Other Ambulatory Visit: Payer: Self-pay

## 2019-06-10 VITALS — BP 128/88 | Ht 64.0 in | Wt 160.0 lb

## 2019-06-10 DIAGNOSIS — N83202 Unspecified ovarian cyst, left side: Secondary | ICD-10-CM

## 2019-06-10 NOTE — Progress Notes (Signed)
Obstetrics & Gynecology Office Visit   Chief Complaint  Patient presents with   Referral  The patient is seen in referral at the request of Burns, Ala Dach, MD from [facility] for left ovarian cyst.   History of Present Illness: 69 y.o. female who is seen in referral from Ellamae Sia, MD from [facility] for left ovarian cyst.  The cyst measures 1.7 x 1.0 x 1.0 cm with a possible single internal septation.  This was noted during a follow up ultrasound on 04/22/19.  She is having no pain.  She had been hospitalized due to an esophageal bleed.  There are no other abnormal findings on ultrasound.  She had a CT of pelvis on 01/11/2019 that showed a 1.6 cm left ovarian cyst. Of note, she is status post hysterectomy.  Her ovaries remain.   Past Medical History:  Diagnosis Date   AAA (abdominal aortic aneurysm) (HCC)    Arthritis    knees, elbows   Asthma    Colon cancer ([place])    colon   COPD (chronic obstructive pulmonary disease) (HCC)    GERD (gastroesophageal reflux disease)    Hypertension    Sleep apnea     Past Surgical History:  Procedure Laterality Date   ABDOMINAL HYSTERECTOMY     APPENDECTOMY     CATARACT EXTRACTION W/PHACO Left 05/28/2019   Procedure: CATARACT EXTRACTION PHACO AND INTRAOCULAR LENS PLACEMENT ([place]) LEFT VISION BLUE 31.19 02:18.2;  Surgeon: Birder Robson, MD;  Location: [facility];  Service: Ophthalmology;  Laterality: Left;   ESOPHAGOGASTRODUODENOSCOPY N/A 01/12/2019   Procedure: ESOPHAGOGASTRODUODENOSCOPY (EGD);  Surgeon: Virgel Manifold, MD;  Location: [facility] ENDOSCOPY;  Service: Endoscopy;  Laterality: N/A;   ESOPHAGOGASTRODUODENOSCOPY (EGD) WITH PROPOFOL N/A 01/14/2019   Procedure: ESOPHAGOGASTRODUODENOSCOPY (EGD) WITH PROPOFOL;  Surgeon: [name], MD;  Location: [facility];  Service: Gastroenterology;  Laterality: N/A;   HEMOSTASIS CLIP PLACEMENT  01/14/2019   Procedure:  HEMOSTASIS CLIP PLACEMENT;  Surgeon: [name], MD;  Location: [facility] ENDOSCOPY;  Service: Gastroenterology;;   KNEE SURGERY Right    fracture repair   TONSILLECTOMY     TUBAL LIGATION     TUMOR EXCISION     colon    Gynecologic History: No LMP recorded. Patient has had a hysterectomy.   Family History  Problem Relation Age of Onset   CAD Mother    CAD Father     Social History   Socioeconomic History   Marital status: Widowed    Spouse name: Not on file   Number of children: Not on file   Years of education: Not on file   Highest education level: Not on file  Occupational History   Not on file  Tobacco Use   Smoking status: Former Smoker    Packs/day: 1.00    Years: 41.00    Pack years: 41.00    Quit date: 01/01/2019    Years since quitting: 0.4   Smokeless tobacco: Never Used  Substance and Sexual Activity   Alcohol use: Yes    Comment: rare- couple times per year   Drug use: Never   Sexual activity: Not on file  Other Topics Concern   Not on file  Social History Narrative   Not on file   Social Determinants of Health   Financial Resource Strain:    Difficulty of Paying Living Expenses:   Food Insecurity:    Worried About [place] in the Last Year:  Ran Out of Food in the Last Year:   Transportation Needs:    Film/video editor (Medical):    Lack of Transportation (Non-Medical):   Physical Activity:    Days of Exercise per Week:    Minutes of Exercise per Session:   Stress:    Feeling of Stress :   Social Connections:    Frequency of Communication with Friends and Family:    Frequency of Social Gatherings with Friends and Family:    Attends Religious Services:    Active Member of Clubs or Organizations:    Attends Archivist Meetings:    Marital Status:   Intimate Partner Violence:    Fear of Current or Ex-Partner:    Emotionally Abused:    Physically Abused:    Sexually Abused:      Allergies  Allergen Reactions   Aspirin     Hx of esophageal bleed    Prior to Admission medications   Medication Sig Start Date End Date Taking? Authorizing Provider  pantoprazole (PROTONIX) 40 MG tablet TAKE 1 TABLET BY MOUTH EVERY DAY 05/30/19   Lequita Asal, MD  acetaminophen (TYLENOL) 500 MG tablet Take 1,000 mg by mouth every 6 (six) hours as needed for mild pain.    [provider]  albuterol (VENTOLIN HFA) 108 (90 Base) MCG/ACT inhaler Inhale 1-2 puffs into the lungs every 4 (four) hours as needed for wheezing or shortness of breath.  07/09/18   [provider]  alendronate (FOSAMAX) 70 MG tablet Take 70 mg by mouth once a week. 07/26/17   [provider]  buPROPion (WELLBUTRIN XL) 150 MG 24 hr tablet Take 150 mg by mouth daily.  02/15/19   [provider]  docusate sodium (COLACE) 100 MG capsule Take 100 mg by mouth daily.    [provider]  ipratropium-albuterol (DUONEB) 0.5-2.5 (3) MG/3ML SOLN Take 3 mLs by nebulization every 4 (four) hours as needed. 01/05/19   Fritzi Mandes, MD  lisinopril (ZESTRIL) 20 MG tablet Take 20 mg by mouth daily.     [provider]  montelukast (SINGULAIR) 10 MG tablet Take 10 mg by mouth at bedtime. 11/14/16   [provider]  oxyCODONE (OXY IR/ROXICODONE) 5 MG immediate release tablet Take 5 mg by mouth 3 (three) times daily as needed for moderate pain or breakthrough pain.     [provider]  sertraline (ZOLOFT) 50 MG tablet Take 100 mg by mouth daily.  11/14/16   [provider]  simvastatin (ZOCOR) 20 MG tablet Take 20 mg by mouth daily. 11/14/16   [provider]  TRELEGY ELLIPTA 100-62.5-25 MCG/INH AEPB Inhale 1 puff into the lungs daily.  02/20/19   [provider]    Review of Systems  Constitutional: Negative.   HENT: Negative.   Eyes: Negative.   Respiratory: Negative.   Cardiovascular: Negative.   Gastrointestinal: Negative.    Genitourinary: Negative.   Musculoskeletal: Negative.   Skin: Negative.   Neurological: Negative.   Psychiatric/Behavioral: Negative.      Physical Exam BP 128/88    Ht 5\' 4"  (1.626 m)    Wt 160 lb (72.6 kg)    BMI 27.46 kg/m  No LMP recorded. Patient has had a hysterectomy. Physical Exam Constitutional:      General: She is not in acute distress.    Appearance: Normal appearance.     Comments: Wearing supplemental oxygen mask  HENT:     Head: Normocephalic and atraumatic.  Eyes:  General: No scleral icterus.    Conjunctiva/sclera: Conjunctivae normal.  Neurological:     General: No focal deficit present.     Mental Status: She is alert and oriented to person, place, and time.     Cranial Nerves: No cranial nerve deficit.  Psychiatric:        Mood and Affect: Mood normal.        Behavior: Behavior normal.        Judgment: Judgment normal.     Female chaperone present for pelvic and breast  portions of the physical exam  Assessment: 69 y.o. No obstetric history on file. female here for  1. Left ovarian cyst      Plan: Problem List Items Addressed This Visit    None    Visit Diagnoses    Left ovarian cyst    -  Primary   Relevant Orders   US PELVIS TRANSVAGINAL NON-OB (TV ONLY)     Given the size of her cyst and the stability of her cyst since her CT scan in December as compared to her pelvic ultrasound in March, the cyst is likely benign. I discussed with her that we will follow this up with another ultrasound in a couple of months to assess clinical stability.  Given her multiple comorbid conditions, she would be a high-risk surgical candidate.  I discussed that the only way to know for sure about this cyst would be to remove it surgically. However, in weighing the risks of a surgery for her against the likelihood that this cyst is malignant, the potential for harm is high.  Therefore, a monitoring approach is recommended.  She voiced understanding and agreement  with this plan.  See discussion below.    1. Cysts ?1 cm: Are clinically inconsequential; at the discretion of the interpreting physician whether or not to describe them in the imaging report; do not need follow-up. 2. Cysts >1 and ?7 cm: Should be described in the imaging report with statement that they are almost certainly benign; yearly follow-up, at least initially, with [place] recommended. Some practices may opt to increase the lower size threshold for follow-up from 1 cm to as high as 3 cm. One may opt to continue follow-up annually or to decrease the frequency of follow-up once stability or decrease in size has been confirmed. Cysts in the larger end of this range should still generally be followed on a regular basis. 3. Cysts >7 cm: Since these may be difficult to assess completely with [place], further imaging with MR or surgical evaluation should be considered.  Gordy Levan et al. Management of Asymptomatic Ovarian and Other Adnexal Cysts Imaged at [place]: Society of Radiologists in [place] Statement 2010. Radiology 256 (Sept 2010): 943-954.).  A total of 40 minutes were spent face-to-face with the patient as well as preparation, review, communication, and documentation during this encounter.  I personally reviewed the images.    Prentice Docker, MD 06/10/2019 4:27 PM    CC: Ellamae Sia, MD [address] [place],  [place] [address]

## 2019-06-11 ENCOUNTER — Other Ambulatory Visit: Payer: Self-pay

## 2019-06-11 ENCOUNTER — Encounter: Payer: Self-pay | Admitting: Ophthalmology

## 2019-06-14 ENCOUNTER — Other Ambulatory Visit: Payer: Self-pay

## 2019-06-14 ENCOUNTER — Other Ambulatory Visit
Admission: RE | Admit: 2019-06-14 | Discharge: 2019-06-14 | Disposition: A | Discharge status: Home / Self Care | Payer: Medicare HMO | Source: Ambulatory Visit | Attending: Ophthalmology | Admitting: Ophthalmology

## 2019-06-14 DIAGNOSIS — Z01812 Encounter for preprocedural laboratory examination: Secondary | ICD-10-CM | POA: Diagnosis present

## 2019-06-14 DIAGNOSIS — Z20822 Contact with and (suspected) exposure to covid-19: Secondary | ICD-10-CM | POA: Diagnosis not present

## 2019-06-14 LAB — SARS CORONAVIRUS 2 (TAT 6-24 HRS): SARS Coronavirus 2: NEGATIVE

## 2019-06-17 NOTE — Discharge Instructions (Signed)

## 2019-06-18 ENCOUNTER — Encounter: Payer: Self-pay | Admitting: Ophthalmology

## 2019-06-18 ENCOUNTER — Ambulatory Visit: Payer: Medicare HMO | Admitting: Anesthesiology

## 2019-06-18 ENCOUNTER — Other Ambulatory Visit: Payer: Self-pay

## 2019-06-18 ENCOUNTER — Encounter
Admission: RE | Disposition: A | Discharge status: Home / Self Care | Payer: Self-pay | Source: Home / Self Care | Attending: Ophthalmology

## 2019-06-18 ENCOUNTER — Ambulatory Visit
Admission: RE | Admit: 2019-06-18 | Discharge: 2019-06-18 | Disposition: A | Discharge status: Home / Self Care | Payer: Medicare HMO | Attending: Ophthalmology | Admitting: Ophthalmology

## 2019-06-18 DIAGNOSIS — Z9842 Cataract extraction status, left eye: Secondary | ICD-10-CM | POA: Diagnosis not present

## 2019-06-18 DIAGNOSIS — J449 Chronic obstructive pulmonary disease, unspecified: Secondary | ICD-10-CM | POA: Diagnosis not present

## 2019-06-18 DIAGNOSIS — F419 Anxiety disorder, unspecified: Secondary | ICD-10-CM | POA: Insufficient documentation

## 2019-06-18 DIAGNOSIS — Z9981 Dependence on supplemental oxygen: Secondary | ICD-10-CM | POA: Diagnosis not present

## 2019-06-18 DIAGNOSIS — F329 Major depressive disorder, single episode, unspecified: Secondary | ICD-10-CM | POA: Diagnosis not present

## 2019-06-18 DIAGNOSIS — Z85038 Personal history of other malignant neoplasm of large intestine: Secondary | ICD-10-CM | POA: Diagnosis not present

## 2019-06-18 DIAGNOSIS — I739 Peripheral vascular disease, unspecified: Secondary | ICD-10-CM | POA: Insufficient documentation

## 2019-06-18 DIAGNOSIS — M1990 Unspecified osteoarthritis, unspecified site: Secondary | ICD-10-CM | POA: Diagnosis not present

## 2019-06-18 DIAGNOSIS — Z87891 Personal history of nicotine dependence: Secondary | ICD-10-CM | POA: Diagnosis not present

## 2019-06-18 DIAGNOSIS — I10 Essential (primary) hypertension: Secondary | ICD-10-CM | POA: Insufficient documentation

## 2019-06-18 DIAGNOSIS — H2511 Age-related nuclear cataract, right eye: Secondary | ICD-10-CM | POA: Diagnosis present

## 2019-06-18 DIAGNOSIS — Z85118 Personal history of other malignant neoplasm of bronchus and lung: Secondary | ICD-10-CM | POA: Diagnosis not present

## 2019-06-18 DIAGNOSIS — Z9049 Acquired absence of other specified parts of digestive tract: Secondary | ICD-10-CM | POA: Insufficient documentation

## 2019-06-18 DIAGNOSIS — Z7951 Long term (current) use of inhaled steroids: Secondary | ICD-10-CM | POA: Insufficient documentation

## 2019-06-18 DIAGNOSIS — Z79899 Other long term (current) drug therapy: Secondary | ICD-10-CM | POA: Diagnosis not present

## 2019-06-18 DIAGNOSIS — E785 Hyperlipidemia, unspecified: Secondary | ICD-10-CM | POA: Diagnosis not present

## 2019-06-18 DIAGNOSIS — K219 Gastro-esophageal reflux disease without esophagitis: Secondary | ICD-10-CM | POA: Insufficient documentation

## 2019-06-18 HISTORY — PX: CATARACT EXTRACTION W/PHACO: SHX586

## 2019-06-18 SURGERY — PHACOEMULSIFICATION, CATARACT, WITH IOL INSERTION
Anesthesia: Monitor Anesthesia Care | Site: Eye | Laterality: Right

## 2019-06-18 MED ORDER — ONDANSETRON HCL 4 MG/2ML IJ SOLN: 4.0000 mg | Freq: Once | INTRAMUSCULAR | Status: DC | PRN

## 2019-06-18 MED ORDER — MIDAZOLAM HCL 2 MG/2ML IJ SOLN
INTRAMUSCULAR | Status: DC | PRN
  Administered 2019-06-18: .5 mg via INTRAVENOUS
  Administered 2019-06-18: 1 mg via INTRAVENOUS

## 2019-06-18 MED ORDER — NA CHONDROIT SULF-NA HYALURON 40-17 MG/ML IO SOLN
INTRAOCULAR | Status: DC | PRN
  Administered 2019-06-18: 1 mL via INTRAOCULAR

## 2019-06-18 MED ORDER — LACTATED RINGERS IV SOLN: 100.0000 mL/h | INTRAVENOUS | Status: DC

## 2019-06-18 MED ORDER — BRIMONIDINE TARTRATE-TIMOLOL 0.2-0.5 % OP SOLN
OPHTHALMIC | Status: DC | PRN
  Administered 2019-06-18: 1 [drp] via OPHTHALMIC

## 2019-06-18 MED ORDER — FENTANYL CITRATE (PF) 100 MCG/2ML IJ SOLN
INTRAMUSCULAR | Status: DC | PRN
  Administered 2019-06-18: 50 ug via INTRAVENOUS

## 2019-06-18 MED ORDER — TETRACAINE HCL 0.5 % OP SOLN
1.0000 [drp] | OPHTHALMIC | Status: DC | PRN
  Administered 2019-06-18 (×3): 1 [drp] via OPHTHALMIC

## 2019-06-18 MED ORDER — LIDOCAINE HCL (PF) 2 % IJ SOLN
INTRAOCULAR | Status: DC | PRN
  Administered 2019-06-18: 1 mL

## 2019-06-18 MED ORDER — ACETAMINOPHEN 10 MG/ML IV SOLN: 1000.0000 mg | Freq: Once | INTRAVENOUS | Status: DC | PRN

## 2019-06-18 MED ORDER — EPINEPHRINE PF 1 MG/ML IJ SOLN
INTRAOCULAR | Status: DC | PRN
  Administered 2019-06-18: 84 mL via OPHTHALMIC

## 2019-06-18 MED ORDER — ARMC OPHTHALMIC DILATING DROPS
1.0000 "application " | OPHTHALMIC | Status: DC | PRN
  Administered 2019-06-18 (×3): 1 via OPHTHALMIC

## 2019-06-18 MED ORDER — MOXIFLOXACIN HCL 0.5 % OP SOLN
OPHTHALMIC | Status: DC | PRN
  Administered 2019-06-18: 0.2 mL via OPHTHALMIC

## 2019-06-18 SURGICAL SUPPLY — 20 items
CANNULA ANT/CHMB 27G (MISCELLANEOUS) ×2 IMPLANT
CANNULA ANT/CHMB 27GA (MISCELLANEOUS) ×6 IMPLANT
GLOVE SURG LX 8.0 MICRO (GLOVE) ×2
GLOVE SURG LX STRL 8.0 MICRO (GLOVE) ×1 IMPLANT
GLOVE SURG TRIUMPH 8.0 PF LTX (GLOVE) ×3 IMPLANT
GOWN STRL REUS W/ TWL LRG LVL3 (GOWN DISPOSABLE) ×2 IMPLANT
GOWN STRL REUS W/TWL LRG LVL3 (GOWN DISPOSABLE) ×6
LENS IOL DIOP 17.5 (Intraocular Lens) ×3 IMPLANT
LENS IOL TECNIS MONO 17.5 (Intraocular Lens) IMPLANT
MARKER SKIN DUAL TIP RULER LAB (MISCELLANEOUS) ×3 IMPLANT
NDL FILTER BLUNT 18X1 1/2 (NEEDLE) ×1 IMPLANT
NEEDLE FILTER BLUNT 18X 1/2SAF (NEEDLE) ×2
NEEDLE FILTER BLUNT 18X1 1/2 (NEEDLE) ×1 IMPLANT
PACK EYE AFTER SURG (MISCELLANEOUS) ×3 IMPLANT
PACK OPTHALMIC (MISCELLANEOUS) ×3 IMPLANT
PACK PORFILIO (MISCELLANEOUS) ×3 IMPLANT
SYR 3ML LL SCALE MARK (SYRINGE) ×3 IMPLANT
SYR TB 1ML LUER SLIP (SYRINGE) ×3 IMPLANT
WATER STERILE IRR 250ML POUR (IV SOLUTION) ×3 IMPLANT
WIPE NON LINTING 3.25X3.25 (MISCELLANEOUS) ×3 IMPLANT

## 2019-06-18 NOTE — Anesthesia Preprocedure Evaluation (Signed)
Anesthesia Evaluation  Patient identified by MRN, date of birth, ID band Patient awake    Reviewed: Allergy & Precautions, NPO status , Patient's Chart, lab work & pertinent test results, reviewed documented beta blocker date and time   History of Anesthesia Complications Negative for: history of anesthetic complications  Airway Mallampati: II  TM Distance: >3 FB Neck ROM: full    Dental  (+) Poor Dentition, Missing   Pulmonary asthma , neg sleep apnea, COPD (3L [place]),  COPD inhaler and oxygen dependent, former smoker,    Pulmonary exam normal breath sounds clear to auscultation       Cardiovascular Exercise Tolerance: Poor hypertension, (-) angina+ Peripheral Vascular Disease (AAA)  Normal cardiovascular exam Rhythm:Regular Rate:Normal   HLD   Neuro/Psych Anxiety Chronic pain: knees    GI/Hepatic GERD  Controlled,  Endo/Other    Renal/GU      Musculoskeletal  (+) Arthritis ,   Abdominal   Peds  Hematology  (+) Blood dyscrasia (Secondary erythrocytosis), anemia , onc stable: Lequita Asal, MD at 04/14/2019  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    Anesthesia Other Findings Colon cancer Lung cancer  Reproductive/Obstetrics                            Anesthesia Physical  Anesthesia Plan  ASA: III  Anesthesia Plan: MAC   Post-op Pain Management:    Induction: Intravenous  PONV Risk Score and Plan: 2 and TIVA, Midazolam and Treatment may vary due to age or medical condition  Airway Management Planned: Nasal Cannula  Additional Equipment:   Intra-op Plan:   Post-operative Plan:   Informed Consent: I have reviewed the patients History and Physical, chart, labs and discussed the procedure including the risks, benefits and alternatives for the proposed anesthesia with the patient or authorized representative who has indicated his/her understanding and  acceptance.       Plan Discussed with: CRNA  Anesthesia Plan Comments:         Anesthesia Quick Evaluation

## 2019-06-18 NOTE — Anesthesia Postprocedure Evaluation (Signed)
Anesthesia Post Note  Patient: Alicia Holder  Procedure(s) Performed: CATARACT EXTRACTION PHACO AND INTRAOCULAR LENS PLACEMENT (IOC) RIGHT 34.59  02:31.7 (Right Eye)     Patient location during evaluation: PACU Anesthesia Type: MAC Level of consciousness: awake and alert Pain management: pain level controlled Vital Signs Assessment: post-procedure vital signs reviewed and stable Respiratory status: spontaneous breathing, nonlabored ventilation, respiratory function stable and patient connected to nasal cannula oxygen Cardiovascular status: stable and blood pressure returned to baseline Postop Assessment: no apparent nausea or vomiting Anesthetic complications: no    Asmi Fugere A  Daionna Crossland

## 2019-06-18 NOTE — Anesthesia Procedure Notes (Signed)
Procedure Name: MAC Date/Time: 06/18/2019 9:31 AM Performed by: Vanetta Shawl, CRNA Pre-anesthesia Checklist: Patient identified, Emergency Drugs available, Suction available, Timeout performed and Patient being monitored Patient Re-evaluated:Patient Re-evaluated prior to induction Oxygen Delivery Method: Nasal cannula Placement Confirmation: positive ETCO2

## 2019-06-18 NOTE — H&P (Signed)
All labs reviewed. Abnormal studies sent to patients PCP when indicated.  Previous H&P reviewed, patient examined, there are NO CHANGES.  Alicia Holder Porfilio5/18/20219:19 AM

## 2019-06-18 NOTE — Transfer of Care (Signed)
Immediate Anesthesia Transfer of Care Note  Patient: Alicia Holder  Procedure(s) Performed: CATARACT EXTRACTION PHACO AND INTRAOCULAR LENS PLACEMENT (IOC) RIGHT 34.59  02:31.7 (Right Eye)  Patient Location: PACU  Anesthesia Type: MAC  Level of Consciousness: awake, alert  and patient cooperative  Airway and Oxygen Therapy: Patient Spontanous Breathing and Patient connected to supplemental oxygen  Post-op Assessment: Post-op Vital signs reviewed, Patient's Cardiovascular Status Stable, Respiratory Function Stable, Patent Airway and No signs of Nausea or vomiting  Post-op Vital Signs: Reviewed and stable  Complications: No apparent anesthesia complications

## 2019-06-18 NOTE — Op Note (Signed)
PREOPERATIVE DIAGNOSIS:  Nuclear sclerotic cataract of the right eye.   POSTOPERATIVE DIAGNOSIS:  H25.11 Cataract   OPERATIVE PROCEDURE:@   SURGEON:  Birder Robson, MD.   ANESTHESIA:  Anesthesiologist: Heniser, Fredric Dine, MD CRNA: Vanetta Shawl, CRNA  1.      Managed anesthesia care. 2.      0.22ml of Shugarcaine was instilled in the eye following the paracentesis.   COMPLICATIONS:  None.   TECHNIQUE:   Stop and chop   DESCRIPTION OF PROCEDURE:  The patient was examined and consented in the preoperative holding area where the aforementioned topical anesthesia was applied to the right eye and then brought back to the Operating Room where the right eye was prepped and draped in the usual sterile ophthalmic fashion and a lid speculum was placed. A paracentesis was created with the side port blade and the anterior chamber was filled with viscoelastic. A near clear corneal incision was performed with the steel keratome. A continuous curvilinear capsulorrhexis was performed with a cystotome followed by the capsulorrhexis forceps. Hydrodissection and hydrodelineation were carried out with BSS on a blunt cannula. The lens was removed in a stop and chop  technique and the remaining cortical material was removed with the irrigation-aspiration handpiece. The capsular bag was inflated with viscoelastic and the Technis [id number]  lens was placed in the capsular bag without complication. The remaining viscoelastic was removed from the eye with the irrigation-aspiration handpiece. The wounds were hydrated. The anterior chamber was flushed with BSS and the eye was inflated to physiologic pressure. 0.63ml of Vigamox was placed in the anterior chamber. The wounds were found to be water tight. The eye was dressed with Combigan. The patient was given protective glasses to wear throughout the day and a shield with which to sleep tonight. The patient was also given drops with which to begin a drop regimen today and will  follow-up with me in one day. Implant Name Type Inv. Item Serial No. Manufacturer Lot No. LRB No. Used Action  LENS IOL DIOP 17.5 - [id number] Intraocular Lens LENS IOL DIOP 17.5 [id number]  Right 1 Implanted   Procedure(s) with comments: CATARACT EXTRACTION PHACO AND INTRAOCULAR LENS PLACEMENT (IOC) RIGHT 34.59  02:31.7 (Right) - sleep apnea  Electronically signed: Birder Robson 06/18/2019 9:51 AM

## 2019-06-19 ENCOUNTER — Encounter: Payer: Self-pay | Admitting: *Deleted

## 2019-06-26 ENCOUNTER — Other Ambulatory Visit: Payer: Self-pay | Admitting: *Deleted

## 2019-06-26 DIAGNOSIS — D500 Iron deficiency anemia secondary to blood loss (chronic): Secondary | ICD-10-CM

## 2019-07-03 ENCOUNTER — Telehealth: Payer: Self-pay

## 2019-07-03 ENCOUNTER — Inpatient Hospital Stay: Payer: Medicare HMO | Attending: Hematology and Oncology

## 2019-07-03 DIAGNOSIS — D509 Iron deficiency anemia, unspecified: Secondary | ICD-10-CM | POA: Insufficient documentation

## 2019-07-03 DIAGNOSIS — Z90710 Acquired absence of both cervix and uterus: Secondary | ICD-10-CM | POA: Insufficient documentation

## 2019-07-03 DIAGNOSIS — J449 Chronic obstructive pulmonary disease, unspecified: Secondary | ICD-10-CM | POA: Insufficient documentation

## 2019-07-03 DIAGNOSIS — E538 Deficiency of other specified B group vitamins: Secondary | ICD-10-CM | POA: Insufficient documentation

## 2019-07-03 DIAGNOSIS — Z87891 Personal history of nicotine dependence: Secondary | ICD-10-CM

## 2019-07-03 DIAGNOSIS — D751 Secondary polycythemia: Secondary | ICD-10-CM | POA: Insufficient documentation

## 2019-07-03 DIAGNOSIS — R7989 Other specified abnormal findings of blood chemistry: Secondary | ICD-10-CM | POA: Insufficient documentation

## 2019-07-03 DIAGNOSIS — C7A00 Malignant carcinoid tumor of unspecified site: Secondary | ICD-10-CM | POA: Insufficient documentation

## 2019-07-03 DIAGNOSIS — I10 Essential (primary) hypertension: Secondary | ICD-10-CM | POA: Insufficient documentation

## 2019-07-03 DIAGNOSIS — Z79899 Other long term (current) drug therapy: Secondary | ICD-10-CM | POA: Insufficient documentation

## 2019-07-03 DIAGNOSIS — Z85038 Personal history of other malignant neoplasm of large intestine: Secondary | ICD-10-CM | POA: Insufficient documentation

## 2019-07-03 DIAGNOSIS — R918 Other nonspecific abnormal finding of lung field: Secondary | ICD-10-CM

## 2019-07-03 DIAGNOSIS — N83202 Unspecified ovarian cyst, left side: Secondary | ICD-10-CM | POA: Insufficient documentation

## 2019-07-03 DIAGNOSIS — R1909 Other intra-abdominal and pelvic swelling, mass and lump: Secondary | ICD-10-CM | POA: Insufficient documentation

## 2019-07-03 DIAGNOSIS — Z8249 Family history of ischemic heart disease and other diseases of the circulatory system: Secondary | ICD-10-CM | POA: Insufficient documentation

## 2019-07-03 NOTE — Progress Notes (Unsigned)
Called patient to go over pre screening questions. Patient stated she got her 1st covid shot and is due to receive her 2nd covid shot on the 24th of June.

## 2019-07-03 NOTE — Telephone Encounter (Addendum)
Patient has been notified that the low dose lung cancer screening CT scan is due currently or will be in near future.  Confirmed that patient is within the appropriate age range and asymptomatic, (no signs or symptoms of lung cancer).  Patient denies illness that would prevent curative treatment for lung cancer if found.  Patient is agreeable for CT scan being scheduled.   Verified smoking history (former smoker, quit 01/2019 with 41 year history).  CT scan scheduled for 07/18/19 @ 3:30

## 2019-07-04 ENCOUNTER — Inpatient Hospital Stay: Payer: Medicare HMO | Admitting: Hematology and Oncology

## 2019-07-04 ENCOUNTER — Inpatient Hospital Stay: Payer: Medicare HMO

## 2019-07-04 ENCOUNTER — Other Ambulatory Visit: Payer: Self-pay | Admitting: Hematology and Oncology

## 2019-07-04 DIAGNOSIS — R7989 Other specified abnormal findings of blood chemistry: Secondary | ICD-10-CM

## 2019-07-04 DIAGNOSIS — N83202 Unspecified ovarian cyst, left side: Secondary | ICD-10-CM

## 2019-07-04 NOTE — Progress Notes (Deleted)
[facility]  [address] [place], [place] [address] Phone: [phone]  Fax: [phone]   Clinic Day:  07/04/2019  Referring physician: Ellamae Sia, MD  Chief Complaint: Alicia Holder is a 69 y.o. female with h/o carcinoid tumor, secondary polycythemia, iron deficiency s/p GI bleed and B12 deficiency who is seen for a 4 month assessment.  HPI:  The patient was last seen in the hematology clinic on 03/14/2019. At that time, she felt better. She had not smoked in 73 days. Exam was stable. Hematocrit was 37.2, hemoglobin 11.0, platelets 214,000, and WBC 6,600.  24 hour urine for 5HIAA was 2.4 mg/24 hours (0-14.9).    The patient has received monthly B12 injections (03/14/2019 - 06/06/2019).   Low dose lung cancer screening chest CT on 04/19/2019 revealed a new 8.0 mm left upper lobe nodule and 7.2 mm right upper lobe nodule from comparison exams, including the most recent study of 01/11/2019, suggesting an infectious or inflammatory etiology. Malignancy certainly cannot be excluded. By size, the largest lesion was characterized as Lung-RADS 4B, suspicious. Nodules were too small for PET resolution. Further evaluation with CT chest without contrast in 4-6 weeks and/or Pulmonology or Thoracic Surgery consultation was recommended. There was asymmetrically enlarged and nodular left lobe of the thyroid. Typically, thyroid ultrasound would be recommended. However, the patient did undergo sonographic evaluation and biopsy 04/03/2017 and 04/20/2017, respectively, and may not require further imaging at that time. She has an ascending aortic aneurysm, stable from 03/16/2017. Recommended annual imaging followup by CTA or MRA.   Chest CT without contrast in 6 weeks was planned.  Chest CT is currently scheduled for 07/18/2019.  Transabdominal and transvaginal ultrasound of pelvis on 04/22/2019 revealed a peripheral 1.7 x 1.0 x 1.0 cm cystic lesion in the left  ovary with a single internal septation. Based on the patient's postmenopausal status and the indeterminate probably benign characteristics, surgical evaluation should be considered.  If surgical evaluation was not elected, then recommended sonographic follow in 3-6 months time.  Patient has a pelvic ultrasound scheduled for x/x/2021.  Labs on 05/09/2019 revealed hematocrit 37.4, hemoglobin 11.3, platelets 171,000, WBC 6,000.  Ferritin was 12.  Chromogranin A was 330.8 ng/mL.  Patient underwent left (05/28/2019) and right eye (06/18/2019) cataracts surgery by Dr. Juliane Lack.   During the interim,***   Past Medical History:  Diagnosis Date  . AAA (abdominal aortic aneurysm) ([place])   . Arthritis    knees, elbows  . Asthma   . Colon cancer ([place])    colon  . COPD (chronic obstructive pulmonary disease) ([place])   . GERD (gastroesophageal reflux disease)   . Hypertension   . Sleep apnea     Past Surgical History:  Procedure Laterality Date  . ABDOMINAL HYSTERECTOMY    . APPENDECTOMY    . CATARACT EXTRACTION W/PHACO Left 05/28/2019   Procedure: CATARACT EXTRACTION PHACO AND INTRAOCULAR LENS PLACEMENT (IOC) LEFT VISION BLUE 31.19 02:18.2;  Surgeon: Birder Robson, MD;  Location: [facility];  Service: Ophthalmology;  Laterality: Left;  . CATARACT EXTRACTION W/PHACO Right 06/18/2019   Procedure: CATARACT EXTRACTION PHACO AND INTRAOCULAR LENS PLACEMENT (IOC) RIGHT 34.59  02:31.7;  Surgeon: Birder Robson, MD;  Location: [facility];  Service: Ophthalmology;  Laterality: Right;  sleep apnea  . ESOPHAGOGASTRODUODENOSCOPY N/A 01/12/2019   Procedure: ESOPHAGOGASTRODUODENOSCOPY (EGD);  Surgeon: Virgel Manifold, MD;  Location: [facility] ENDOSCOPY;  Service: Endoscopy;  Laterality: N/A;  . ESOPHAGOGASTRODUODENOSCOPY (EGD) WITH PROPOFOL N/A 01/14/2019  Procedure: ESOPHAGOGASTRODUODENOSCOPY (EGD) WITH PROPOFOL;  Surgeon: [name], MD;  Location: [facility];  Service:  Gastroenterology;  Laterality: N/A;  . HEMOSTASIS CLIP PLACEMENT  01/14/2019   Procedure: HEMOSTASIS CLIP PLACEMENT;  Surgeon: [name], MD;  Location: [facility];  Service: Gastroenterology;;  . KNEE SURGERY Right    fracture repair  . TONSILLECTOMY    . TUBAL LIGATION    . TUMOR EXCISION     colon    Family History  Problem Relation Age of Onset  . CAD Mother   . CAD Father     Social History:  reports that she quit smoking about 6 months ago. She has a 41.00 pack-year smoking history. She has never used smokeless tobacco. She reports current alcohol use. She reports that she does not use drugs.The patient is accompanied by her daughter on the ipad today.  Allergies:  Allergies  Allergen Reactions  . Aspirin     Hx of esophageal bleed    Current Medications: Current Outpatient Medications  Medication Sig Dispense Refill  . acetaminophen (TYLENOL) 500 MG tablet Take 1,000 mg by mouth every 6 (six) hours as needed for mild pain.    Marland Kitchen albuterol (VENTOLIN HFA) 108 (90 Base) MCG/ACT inhaler Inhale 1-2 puffs into the lungs every 4 (four) hours as needed for wheezing or shortness of breath.     Marland Kitchen alendronate (FOSAMAX) 70 MG tablet Take 70 mg by mouth once a week.    Marland Kitchen buPROPion (WELLBUTRIN XL) 150 MG 24 hr tablet Take 150 mg by mouth daily.     Marland Kitchen docusate sodium (COLACE) 100 MG capsule Take 100 mg by mouth daily.    Marland Kitchen ipratropium-albuterol (DUONEB) 0.5-2.5 (3) MG/3ML SOLN Take 3 mLs by nebulization every 4 (four) hours as needed. 360 mL 0  . lisinopril (ZESTRIL) 20 MG tablet Take 20 mg by mouth daily.     . montelukast (SINGULAIR) 10 MG tablet Take 10 mg by mouth at bedtime.    Marland Kitchen oxyCODONE (OXY IR/ROXICODONE) 5 MG immediate release tablet Take 5 mg by mouth 3 (three) times daily as needed for moderate pain or breakthrough pain.     . pantoprazole (PROTONIX) 40 MG tablet TAKE 1 TABLET BY MOUTH EVERY DAY 90 tablet 0  . sertraline (ZOLOFT) 50 MG tablet Take 100 mg by mouth daily.      . simvastatin (ZOCOR) 20 MG tablet Take 20 mg by mouth daily.    . TRELEGY ELLIPTA 100-62.5-25 MCG/INH AEPB Inhale 1 puff into the lungs daily.      No current facility-administered medications for this visit.    Review of Systems  Constitutional: Negative for chills, diaphoresis, fever, malaise/fatigue and weight loss (up 4 pounds; goal weight 145 lbs).       Feels "better".   HENT: Negative.  Negative for congestion, ear pain, hearing loss, nosebleeds, sinus pain and sore throat.   Eyes: Negative.  Negative for blurred vision and double vision.  Respiratory: Negative.  Negative for cough, hemoptysis and shortness of breath.        On 3 liters/min of oxygen.  Cardiovascular: Negative.  Negative for chest pain, palpitations, orthopnea and PND.  Gastrointestinal: Negative.  Negative for abdominal pain, blood in stool, constipation, diarrhea, melena, nausea and vomiting.       Eating well. Soft diet. No spicy or greasy fried food.  Genitourinary: Negative.  Negative for dysuria, frequency, hematuria and urgency.  Musculoskeletal: Positive for joint pain (chronic right knee pain). Negative for back pain,  myalgias and neck pain.  Skin: Negative.  Negative for rash.  Neurological: Negative.  Negative for dizziness, tingling, sensory change, focal weakness, weakness and headaches.  Endo/Heme/Allergies: Negative.  Does not bruise/bleed easily.  Psychiatric/Behavioral: Negative.  Negative for depression and memory loss. The patient is not nervous/anxious and does not have insomnia.   All other systems reviewed and are negative.  Performance status (ECOG):  1  Vitals There were no vitals taken for this visit.   Physical Exam  Constitutional: She is oriented to person, place, and time. She appears well-developed and well-nourished. No distress.  She has a portable oxygen tank by her side.  HENT:  Head: Normocephalic and atraumatic.  Mouth/Throat: Oropharynx is clear and moist. No  oropharyngeal exudate.  Auburn/brown styled hair. Edentulous.  Cochrane in place.  Mask.  Eyes: Pupils are equal, round, and reactive to light. Conjunctivae and EOM are normal. No scleral icterus.  Blue eyes.  Neck: No JVD present.  Cardiovascular: Normal rate, regular rhythm and normal heart sounds.  No murmur heard. Pulmonary/Chest: Effort normal and breath sounds normal. No respiratory distress. She has no wheezes. She has no rales.  Abdominal: Soft. Bowel sounds are normal. She exhibits no distension and no mass. There is no hepatosplenomegaly. There is no abdominal tenderness. There is no rebound and no guarding.  Musculoskeletal:        General: No edema. Normal range of motion.     Cervical back: Normal range of motion and neck supple.  Lymphadenopathy:       Head (right side): No preauricular, no posterior auricular and no occipital adenopathy present.       Head (left side): No preauricular, no posterior auricular and no occipital adenopathy present.    She has no cervical adenopathy.    She has no axillary adenopathy.       Right: No supraclavicular adenopathy present.       Left: No supraclavicular adenopathy present.  Neurological: She is alert and oriented to person, place, and time.  Skin: Skin is warm and dry. She is not diaphoretic. No pallor.  Nicotine stained nails.  Psychiatric: She has a normal mood and affect. Her behavior is normal. Judgment and thought content normal.  Nursing note and vitals reviewed.   No visits with results within 3 Day(s) from this visit.  Latest known visit with results is:  Hospital Outpatient Visit on 06/14/2019  Component Date Value Ref Range Status  . SARS Coronavirus 2 06/14/2019 NEGATIVE  NEGATIVE Final   Comment: (NOTE) SARS-CoV-2 target nucleic acids are NOT DETECTED. The SARS-CoV-2 RNA is generally detectable in upper and lower respiratory specimens during the acute phase of infection. Negative results do not preclude SARS-CoV-2  infection, do not rule out co-infections with other pathogens, and should not be used as the sole basis for treatment or other patient management decisions. Negative results must be combined with clinical observations, patient history, and epidemiological information. The expected result is Negative. Fact Sheet for Patients: SugarRoll.be Fact Sheet for Healthcare Providers: https://www.woods-mathews.com/ This test is not yet approved or cleared by the [place] FDA and  has been authorized for detection and/or diagnosis of SARS-CoV-2 by FDA under an Emergency Use Authorization (EUA). This EUA will remain  in effect (meaning this test can be used) for the duration of the COVID-19 declaration under Section 56                          4(b)(1)  of the Act, 21 U.S.C. section 360bbb-3(b)(1), unless the authorization is terminated or revoked sooner. 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 She has a 60+ pack year smoking history. She denies sleep apnea.   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 Last colonoscopywas 10/2012 [phone] per patient). She has lost 120 pounds in 3 years with portion control and food choices.  Work-upon 01/04/2017 revealed a hematocrit of 50.8, hemoglobin 17, MCV 94.7, platelets 173,000, white count 7400 with an ANC of 5100. Differential was normal. Normal labs included CMP, epo level (10.7), ferritin (27), with an iron saturation (6%), JAK2 V617F and exons 12-15 were negative. BCR-ABL testing was negative for e1a2 (p190), e13a2 (b2a2, p210) and e14a2 (b3a2, p210) fusion transcripts. Carbon monoxide levelwas 17% (<9% smokers).   Low dose chest CT on 03/16/2017 was lung-RADS 2, benign appearance or behavior. There was left main and two-vessel coronary  atherosclerosis. There was a 4.2 cm ascending thoracic aortic aneurysmwith recommendation for annual imaging followup by CTA or MRA. 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 There was a multinodular goiter. Suspected dominant 4.5 cm inferior left thyroid lobe nodule. There was a small to moderate hiatal hernia. Low dose chest CTon 03/20/2018 revealed benign appearance or behavior. Lung-RADS score was 2.  Thyroid ultrasound on 04/03/2017 revealed 7 nodules. Left mid thyroid nodule (labeled 4) met criteria for biopsy. The left inferior thyroid nodule (labeled 7) would meet criteria for biopsy, however, was favored to be a pseudo nodule and extension of thyroid goiter into the mediastinum given the appearance on prior CT. Left thyroid nodules (labeled 5 and 6) both met criteria for surveillance. None of the right-sided nodules met criteria for surveillance or biopsy.Thyroid ultrasoundon 09/10/2018 revealed thyromegaly with bilateral nodules, stable. None metcriteria for biopsy. Recommended annual/biennial ultrasound follow-up   FNA thyroid biopsy x 2 on 04/20/2017 of the left mid thyroid nodule and left inferior thyroid nodule were benign (Bethesda category II) and c/w benign follicular nodule.   She began a phlebotomy programon 02/08/2017 (last 10/12/2018). She undergoes small volume phlebotomy (300 cc)if her hematocrit is >47.  She was admitted to [facility] from 01/11/2019 - 01/13/2019 then transferred to [facility] from 01/13/2019 - 01/18/2019 for GI bleeding.  Hemoglobin was 5.9.  EGD on 01/12/2019 revealed red blood in the distal esophagus and large blood clots in the gastric fundus. Hemospray was used to treat the distal esophagus and gastric fundus.  She received 6 units of PRBCs.  She received a Protonix drip. She was transferred to [facility] for vascular intervention.  EGD on 01/14/2019 revealed severe (LA grade D) esophagitis with bleeding.  There were esophageal ulcers with  active bleeding.  Hemostatic clip was placed.There was erythematous mucosa in the cardia, gastric fundus, gastric body, incisura, antrum, and prepyloric region of the stomach and pylorus.  She received 1 unit of PRBCs on 01/17/2019.  She has iron deficiency anemia s/p massive GI bleeding.  Labs on 01/11/2019 revealed a ferritin 26 with an iron saturation 12% with a TIBC 247.  B12 was 173 (low) and folate 10.3 (normal).  Hemolysis evaluation (Coombs, haptoglobin, LDH) was normal.  Retic was 4.8%.  She received weekly Venofer x 3 (02/18/2019 - 03/04/2019).   She has B12 deficiency.  B12 was 173 on 01/11/2019.  She receives monthly B12 injections (began 01/14/2019; last 06/06/2019).  She  has a history of carcinoid tumor.  Chromogranin was 926.7 (0-101.8) on 02/13/2019 and 330.8 on 05/09/2019.   24 hour urine for 5HIAA was 2.4 mg/24 hours (0-14.9) on 03/17/2019.  Symptomatically, ***  Plan: 1.   Review labs from 07/05/2019.  2.   Secondary erythrocytosis Patient no longer has erythrocytosis secondary to GI bleed.  She stopped smoking.  Hematocrit 37.2  Hemoglobin 11.0.  MCV 90.1 on 03/14/2019.  Hematocrit 44.5  Hemoglobin 14.0.  MCV 90.6 on 07/05/2019.  Continue to monitor.  3.  GI bleed             EGD on 01/14/2019 revealed severe esophagitis with ulcers and active bleeding.             Phone follow-up with Dr Allen Norris re: Protonix and Carafate.  She completed a course of Carafate and remains on Protonix.             Refill Protonix.  She is due for a repeat upper endoscopy.                         Patient wishes to stay local rather than to go to [facility].  4.   Iron deficiency             Hematocrit 44.5  Hemoglobin 14.0.  MCV 90.6 on 07/05/2019.             Ferritin 27 on 07/05/2019.             She completed Venofer x 3 on 03/04/2019.  Continue to monitor.  5.   B12 deficiency             B12 was 173 on 01/11/2019.             She received B12 on 06/06/2019.              B12 today and monthly x 6.  6.  History of carcinoid tumor             Chromogranin was 926.7 (high) on 02/13/2019.  Unclear if elevation due to recurrent carcinoid, chronic atrophic gastritis or PPI.  Patient to turn in 24 hour urine for 5HIAA.   RN to call patient with results.  Consider octreotide scan vs DOTATE PET scan.  7.   Thyroid nodules and low TSH Thyroid ultrasoundon 09/10/2018 revealed stable bilateral thyroid nodules. Continue to monitor.  8.Weight loss Weight up 4 pounds. Patient confirms plan to lose weight.   Goal weight 145 pounds. Continue to monitor.  9.     Left ovarian cyst  10.   RTC in 2 months for labs (CBC with diff, ferritin, chromogranin) and B12. 10.   RTC in 4 months for MD assessment, labs (CBC with diff, ferritin, iron studies- day before), B12 and +/- Venofer.  I discussed the assessment and treatment plan with the patient.  The patient was provided an opportunity to ask questions and all were answered.  The patient agreed with the plan and demonstrated an understanding of the instructions.  The patient was advised to call back if the symptoms worsen or if the condition fails to improve as anticipated.  I provided 18 minutes of face-to-face time during this this encounter and > 50% was spent counseling as documented under my assessment and plan.  An additional 10-12 minutes were spent reviewing her chart (Epic and Care Everywhere) including notes, labs, and imaging studies.  Dr Allen Norris was contacted.  Korissa Horsford C  Mike Gip, MD, PhD    07/04/2019, 8:28 AM  I, Selena Batten, am acting as scribe for [place]. Mike Gip, MD, PhD.  I, Hildreth Orsak C. Mike Gip, MD, have reviewed the above documentation for accuracy and completeness, and I agree with the above.

## 2019-07-05 ENCOUNTER — Other Ambulatory Visit: Payer: Self-pay

## 2019-07-05 ENCOUNTER — Encounter: Payer: Self-pay | Admitting: Hematology and Oncology

## 2019-07-05 ENCOUNTER — Inpatient Hospital Stay: Payer: Medicare HMO

## 2019-07-05 DIAGNOSIS — C7A00 Malignant carcinoid tumor of unspecified site: Secondary | ICD-10-CM | POA: Diagnosis not present

## 2019-07-05 DIAGNOSIS — D500 Iron deficiency anemia secondary to blood loss (chronic): Secondary | ICD-10-CM

## 2019-07-05 DIAGNOSIS — N83202 Unspecified ovarian cyst, left side: Secondary | ICD-10-CM

## 2019-07-05 DIAGNOSIS — Z85038 Personal history of other malignant neoplasm of large intestine: Secondary | ICD-10-CM | POA: Diagnosis not present

## 2019-07-05 DIAGNOSIS — Z79899 Other long term (current) drug therapy: Secondary | ICD-10-CM | POA: Diagnosis not present

## 2019-07-05 DIAGNOSIS — R1909 Other intra-abdominal and pelvic swelling, mass and lump: Secondary | ICD-10-CM | POA: Diagnosis not present

## 2019-07-05 DIAGNOSIS — D509 Iron deficiency anemia, unspecified: Secondary | ICD-10-CM | POA: Diagnosis present

## 2019-07-05 DIAGNOSIS — D751 Secondary polycythemia: Secondary | ICD-10-CM | POA: Diagnosis present

## 2019-07-05 DIAGNOSIS — Z8249 Family history of ischemic heart disease and other diseases of the circulatory system: Secondary | ICD-10-CM | POA: Diagnosis not present

## 2019-07-05 DIAGNOSIS — Z87891 Personal history of nicotine dependence: Secondary | ICD-10-CM | POA: Diagnosis not present

## 2019-07-05 DIAGNOSIS — R7989 Other specified abnormal findings of blood chemistry: Secondary | ICD-10-CM

## 2019-07-05 DIAGNOSIS — E538 Deficiency of other specified B group vitamins: Secondary | ICD-10-CM | POA: Diagnosis present

## 2019-07-05 DIAGNOSIS — J449 Chronic obstructive pulmonary disease, unspecified: Secondary | ICD-10-CM | POA: Diagnosis not present

## 2019-07-05 DIAGNOSIS — Z90710 Acquired absence of both cervix and uterus: Secondary | ICD-10-CM | POA: Diagnosis not present

## 2019-07-05 DIAGNOSIS — I10 Essential (primary) hypertension: Secondary | ICD-10-CM | POA: Diagnosis not present

## 2019-07-05 LAB — CBC WITH DIFFERENTIAL/PLATELET
Abs Immature Granulocytes: 0.02 10*3/uL (ref 0.00–0.07)
Basophils Absolute: 0.1 10*3/uL (ref 0.0–0.1)
Basophils Relative: 1 %
Eosinophils Absolute: 0.1 10*3/uL (ref 0.0–0.5)
Eosinophils Relative: 2 %
HCT: 44.5 % (ref 36.0–46.0)
Hemoglobin: 14 g/dL (ref 12.0–15.0)
Immature Granulocytes: 0 %
Lymphocytes Relative: 27 %
Lymphs Abs: 2.1 10*3/uL (ref 0.7–4.0)
MCH: 28.5 pg (ref 26.0–34.0)
MCHC: 31.5 g/dL (ref 30.0–36.0)
MCV: 90.6 fL (ref 80.0–100.0)
Monocytes Absolute: 0.6 10*3/uL (ref 0.1–1.0)
Monocytes Relative: 8 %
Neutro Abs: 4.8 10*3/uL (ref 1.7–7.7)
Neutrophils Relative %: 62 %
Platelets: 208 10*3/uL (ref 150–400)
RBC: 4.91 MIL/uL (ref 3.87–5.11)
RDW: 14.3 % (ref 11.5–15.5)
WBC: 7.8 10*3/uL (ref 4.0–10.5)
nRBC: 0 % (ref 0.0–0.2)

## 2019-07-05 LAB — IRON AND TIBC
Iron: 62 ug/dL (ref 28–170)
Saturation Ratios: 15 % (ref 10.4–31.8)
TIBC: 421 ug/dL (ref 250–450)
UIBC: 359 ug/dL

## 2019-07-05 LAB — FERRITIN: Ferritin: 27 ng/mL (ref 11–307)

## 2019-07-05 LAB — T4, FREE: Free T4: 0.97 ng/dL (ref 0.61–1.12)

## 2019-07-05 LAB — TSH: TSH: 1.182 u[IU]/mL (ref 0.350–4.500)

## 2019-07-05 NOTE — Progress Notes (Signed)
Patient was called for pre assessment. She denies any pain or concerns at this time.  

## 2019-07-07 LAB — CA 125: Cancer Antigen (CA) 125: 4.3 U/mL (ref 0.0–38.1)

## 2019-07-08 ENCOUNTER — Other Ambulatory Visit: Payer: Self-pay

## 2019-07-08 ENCOUNTER — Encounter: Payer: Self-pay | Admitting: Hematology and Oncology

## 2019-07-08 ENCOUNTER — Inpatient Hospital Stay: Payer: Medicare HMO

## 2019-07-08 ENCOUNTER — Inpatient Hospital Stay (HOSPITAL_BASED_OUTPATIENT_CLINIC_OR_DEPARTMENT_OTHER): Payer: Medicare HMO | Admitting: Hematology and Oncology

## 2019-07-08 VITALS — BP 134/79 | HR 71 | Temp 96.3°F | Resp 18 | Wt 159.3 lb

## 2019-07-08 DIAGNOSIS — E041 Nontoxic single thyroid nodule: Secondary | ICD-10-CM

## 2019-07-08 DIAGNOSIS — D500 Iron deficiency anemia secondary to blood loss (chronic): Secondary | ICD-10-CM

## 2019-07-08 DIAGNOSIS — D751 Secondary polycythemia: Secondary | ICD-10-CM | POA: Diagnosis not present

## 2019-07-08 DIAGNOSIS — R7989 Other specified abnormal findings of blood chemistry: Secondary | ICD-10-CM

## 2019-07-08 DIAGNOSIS — E538 Deficiency of other specified B group vitamins: Secondary | ICD-10-CM

## 2019-07-08 DIAGNOSIS — N83202 Unspecified ovarian cyst, left side: Secondary | ICD-10-CM

## 2019-07-08 DIAGNOSIS — Z85110 Personal history of malignant carcinoid tumor of bronchus and lung: Secondary | ICD-10-CM

## 2019-07-08 DIAGNOSIS — R634 Abnormal weight loss: Secondary | ICD-10-CM

## 2019-07-08 MED ORDER — CYANOCOBALAMIN 1000 MCG/ML IJ SOLN
1000.0000 ug | Freq: Once | INTRAMUSCULAR | Status: AC
  Administered 2019-07-08: 1000 ug via INTRAMUSCULAR
  Filled 2019-07-08: qty 1

## 2019-07-08 NOTE — Progress Notes (Signed)
[facility]  [address], [address] [place], [place] [address] Phone: [phone]  Fax: [phone]   Clinic Day:  07/08/2019  Referring physician: Ellamae Sia, MD  Chief Complaint: Alicia Holder is a 69 y.o. female with h/o carcinoid tumor, secondary polycythemia, iron deficiency s/p GI bleed and B12 deficiency who is seen for 4 month assessment.  HPI:  The patient was last seen in the hematology clinic on 03/14/2019. At that time, she felt better. She had not smoked in 73 days. Exam was stable. Hematocrit was 37.2, hemoglobin 11.0, platelets 214,000, and WBC 6,600.  24 hour urine for 5HIAA was 2.4 mg/24 hours (0-14.9).    The patient has received monthly B12 injections (03/14/2019 - 06/06/2019).   Low dose lung cancer screening chest CT on 04/19/2019 revealed a new 8.0 mm left upper lobe nodule and 7.2 mm right upper lobe nodule from comparison exams, including the most recent study of 01/11/2019, suggesting an infectious or inflammatory etiology. Malignancy certainly cannot be excluded. By size, the largest lesion was characterized as Lung-RADS 4B, suspicious. Nodules were too small for PET resolution. There was asymmetrically enlarged and nodular left lobe of the thyroid. Typically, thyroid ultrasound would be recommended. However, the patient did undergo sonographic evaluation and biopsy 04/03/2017 and 04/20/2017, respectively, and may not require further imaging at that time. She has an ascending aortic aneurysm, stable from 03/16/2017. Recommended annual imaging followup by CTA or MRA.   Chest CT without contrast in 6 weeks was planned.  Chest CT is currently scheduled for 07/18/2019.  Transabdominal and transvaginal ultrasound of pelvis on 04/22/2019 revealed a peripheral 1.7 x 1.0 x 1.0 cm cystic lesion in the left ovary with a single internal septation. Based on the patient's postmenopausal status and the indeterminate probably benign  characteristics, surgical evaluation should be considered.  If surgical evaluation was not elected, then recommended sonographic follow in 3-6 months time.  Patient has a pelvic ultrasound scheduled for 08/16/2019. If this ultrasound is unchanged, the patient states that Dr. Quay Burow will allow her to go to 6 month follow ups.   She is scheduled for an ultrasound of her thyroid in July 2021.   Labs on 05/09/2019 revealed hematocrit 37.4, hemoglobin 11.3, platelets 171,000, WBC 6,000.  Ferritin was 12.  Chromogranin A was 330.8 ng/mL.  Patient underwent left (05/28/2019) and right eye (06/18/2019) cataract surgery by Dr. Juliane Lack.   During the interim,she has been well. She notes that she is now seven months without smoking. She notes stables shortness of breath. She has reduced her oxygen from 3L to 2L/min and she is tolerating this well. She has a healthy diet and has been choosing healthy meats and vegetable options. She continues to watch her intake of spicy and acidic foods for her acid reflux. She has been walking regularly for exercise.   She had her first COVID-19 shot at the end of May and notes that the only side effect the had was a sore arm. She will be due for her second shot next week.    Past Medical History:  Diagnosis Date  . AAA (abdominal aortic aneurysm) ([place])   . Arthritis    knees, elbows  . Asthma   . Colon cancer ([place])    colon  . COPD (chronic obstructive pulmonary disease) ([place])   . GERD (gastroesophageal reflux disease)   . Hypertension   . Sleep apnea     Past Surgical History:  Procedure Laterality Date  .  ABDOMINAL HYSTERECTOMY    . APPENDECTOMY    . CATARACT EXTRACTION W/PHACO Left 05/28/2019   Procedure: CATARACT EXTRACTION PHACO AND INTRAOCULAR LENS PLACEMENT (IOC) LEFT VISION BLUE 31.19 02:18.2;  Surgeon: Birder Robson, MD;  Location: [facility];  Service: Ophthalmology;  Laterality: Left;  . CATARACT EXTRACTION W/PHACO Right  06/18/2019   Procedure: CATARACT EXTRACTION PHACO AND INTRAOCULAR LENS PLACEMENT (IOC) RIGHT 34.59  02:31.7;  Surgeon: Birder Robson, MD;  Location: [facility];  Service: Ophthalmology;  Laterality: Right;  sleep apnea  . ESOPHAGOGASTRODUODENOSCOPY N/A 01/12/2019   Procedure: ESOPHAGOGASTRODUODENOSCOPY (EGD);  Surgeon: Virgel Manifold, MD;  Location: [facility];  Service: Endoscopy;  Laterality: N/A;  . ESOPHAGOGASTRODUODENOSCOPY (EGD) WITH PROPOFOL N/A 01/14/2019   Procedure: ESOPHAGOGASTRODUODENOSCOPY (EGD) WITH PROPOFOL;  Surgeon: [name], MD;  Location: [facility];  Service: Gastroenterology;  Laterality: N/A;  . HEMOSTASIS CLIP PLACEMENT  01/14/2019   Procedure: HEMOSTASIS CLIP PLACEMENT;  Surgeon: [name], MD;  Location: [facility];  Service: Gastroenterology;;  . KNEE SURGERY Right    fracture repair  . TONSILLECTOMY    . TUBAL LIGATION    . TUMOR EXCISION     colon    Family History  Problem Relation Age of Onset  . CAD Mother   . CAD Father     Social History:  reports that she quit smoking about 6 months ago. She has a 41.00 pack-year smoking history. She has never used smokeless tobacco. She reports current alcohol use. She reports that she does not use drugs. She is a great-grandmother. The patient is alone today.   Allergies:  Allergies  Allergen Reactions  . Aspirin     Hx of esophageal bleed    Current Medications: Current Outpatient Medications  Medication Sig Dispense Refill  . acetaminophen (TYLENOL) 500 MG tablet Take 1,000 mg by mouth every 6 (six) hours as needed for mild pain.    Marland Kitchen albuterol (VENTOLIN HFA) 108 (90 Base) MCG/ACT inhaler Inhale 1-2 puffs into the lungs every 4 (four) hours as needed for wheezing or shortness of breath.     Marland Kitchen alendronate (FOSAMAX) 70 MG tablet Take 70 mg by mouth once a week.    Marland Kitchen buPROPion (WELLBUTRIN XL) 150 MG 24 hr tablet Take 150 mg by mouth daily.     Marland Kitchen docusate sodium (COLACE) 100 MG  capsule Take 100 mg by mouth daily.    Marland Kitchen ipratropium-albuterol (DUONEB) 0.5-2.5 (3) MG/3ML SOLN Take 3 mLs by nebulization every 4 (four) hours as needed. 360 mL 0  . lisinopril (ZESTRIL) 20 MG tablet Take 20 mg by mouth daily.     . montelukast (SINGULAIR) 10 MG tablet Take 10 mg by mouth at bedtime.    Marland Kitchen oxyCODONE (OXY IR/ROXICODONE) 5 MG immediate release tablet Take 5 mg by mouth 3 (three) times daily as needed for moderate pain or breakthrough pain.     . pantoprazole (PROTONIX) 40 MG tablet TAKE 1 TABLET BY MOUTH EVERY DAY 90 tablet 0  . sertraline (ZOLOFT) 100 MG tablet Take 100 mg by mouth daily.    . simvastatin (ZOCOR) 20 MG tablet Take 20 mg by mouth daily.    . TRELEGY ELLIPTA 100-62.5-25 MCG/INH AEPB Inhale 1 puff into the lungs daily.      No current facility-administered medications for this visit.    Review of Systems  Constitutional: Negative for chills, diaphoresis, fever, malaise/fatigue and weight loss (up 2 pounds; goal weight 145 lbs).       Feels "better".  HENT: Negative.  Negative for congestion, ear pain, hearing loss, nosebleeds, sinus pain and sore throat.   Eyes: Negative.  Negative for blurred vision and double vision.  Respiratory: Positive for shortness of breath (on oxygen, 2L). Negative for cough and hemoptysis.        On 3 liters/min of oxygen.  Cardiovascular: Negative.  Negative for chest pain, palpitations, orthopnea and PND.  Gastrointestinal: Negative.  Negative for abdominal pain, blood in stool, constipation, diarrhea, melena, nausea and vomiting.       Eating well. Soft diet. No spicy or greasy fried food.  Genitourinary: Negative.  Negative for dysuria, frequency, hematuria and urgency.  Musculoskeletal: Positive for joint pain (chronic right knee pain, unchanged). Negative for back pain, myalgias and neck pain.  Skin: Negative.  Negative for rash.  Neurological: Negative.  Negative for dizziness, tingling, sensory change, focal weakness,  weakness and headaches.  Endo/Heme/Allergies: Negative.  Does not bruise/bleed easily.  Psychiatric/Behavioral: Negative.  Negative for depression and memory loss. The patient is not nervous/anxious and does not have insomnia.   All other systems reviewed and are negative.  Performance status (ECOG): 1 - Symptomatic but completely ambulatory   Vitals Blood pressure 134/79, pulse 71, temperature (!) 96.3 F (35.7 C), temperature source Tympanic, resp. rate 18, weight 159 lb 4.5 oz (72.3 kg), SpO2 92 %.   Physical Exam  Constitutional: She is oriented to person, place, and time. She appears well-developed and well-nourished. No distress.  She has a portable oxygen tank by her side.  HENT:  Head: Normocephalic and atraumatic.  Mouth/Throat: Oropharynx is clear and moist. No oropharyngeal exudate.  Auburn/brown styled hair. Edentulous.  Maunie in place.  Mask.  Eyes: Pupils are equal, round, and reactive to light. Conjunctivae and EOM are normal. No scleral icterus.  Blue eyes.  Neck: No JVD present.  Cardiovascular: Normal rate, regular rhythm and normal heart sounds.  No murmur heard. Pulmonary/Chest: Effort normal and breath sounds normal. No respiratory distress. She has no wheezes. She has no rales.  Abdominal: Soft. Bowel sounds are normal. She exhibits no distension and no mass. There is no hepatosplenomegaly. There is no abdominal tenderness. There is no rebound and no guarding.  Musculoskeletal:        General: No edema. Normal range of motion.     Cervical back: Normal range of motion and neck supple.  Lymphadenopathy:       Head (right side): No preauricular, no posterior auricular and no occipital adenopathy present.       Head (left side): No preauricular, no posterior auricular and no occipital adenopathy present.    She has no cervical adenopathy.    She has no axillary adenopathy.       Right: No supraclavicular adenopathy present.       Left: No supraclavicular adenopathy  present.  Neurological: She is alert and oriented to person, place, and time.  Skin: Skin is warm and dry. She is not diaphoretic. No pallor.  Nicotine stained nails.  Psychiatric: She has a normal mood and affect. Her behavior is normal. Judgment and thought content normal.  Nursing note and vitals reviewed.   No visits with results within 3 Day(s) from this visit.  Latest known visit with results is:  Appointment on 07/05/2019  Component Date Value Ref Range Status  . Cancer Antigen (CA) 125 07/05/2019 4.3  0.0 - 38.1 U/mL Final   Comment: (NOTE) Roche Diagnostics Electrochemiluminescence Immunoassay (ECLIA) Values obtained with different assay methods or kits cannot  be used interchangeably.  Results cannot be interpreted as absolute evidence of the presence or absence of malignant disease. Performed At: [facility] [place], [place] [id number] Rush Farmer MD ER:[id number]   . Free T4 07/05/2019 0.97  0.61 - 1.12 ng/dL Final   Comment: (NOTE) Biotin ingestion may interfere with free T4 tests. If the results are inconsistent with the TSH level, previous test results, or the clinical presentation, then consider biotin interference. If needed, order repeat testing after stopping biotin. Performed at [facility], [address] [place], [place] [address]   . TSH 07/05/2019 1.182  0.350 - 4.500 uIU/mL Final   Comment: Performed by a 3rd Generation assay with a functional sensitivity of <=0.01 uIU/mL. Performed at [facility], [address], [place], [place] [address]   . Ferritin 07/05/2019 27  11 - 307 ng/mL Final   Performed at [facility], [address] [place], [place] [address]  . Iron 07/05/2019 62  28 - 170 ug/dL Final  . TIBC 07/05/2019 421  250 - 450 ug/dL Final  . Saturation Ratios 07/05/2019 15  10.4 - 31.8 % Final  . UIBC 07/05/2019 359  ug/dL Final   Performed at [facility], [address] [place], [place] [address]  . WBC 07/05/2019 7.8  4.0 - 10.5 K/uL Final  . RBC 07/05/2019 4.91  3.87 - 5.11 MIL/uL Final  . Hemoglobin 07/05/2019 14.0  12.0 - 15.0 g/dL Final  . HCT 07/05/2019 44.5  36.0 - 46.0 % Final  . MCV 07/05/2019 90.6  80.0 - 100.0 fL Final  . MCH 07/05/2019 28.5  26.0 - 34.0 pg Final  . MCHC 07/05/2019 31.5  30.0 - 36.0 g/dL Final  . RDW 07/05/2019 14.3  11.5 - 15.5 % Final  . Platelets 07/05/2019 208  150 - 400 K/uL Final  . nRBC 07/05/2019 0.0  0.0 - 0.2 % Final  . Neutrophils Relative % 07/05/2019 62  % Final  . Neutro Abs 07/05/2019 4.8  1.7 - 7.7 K/uL Final  . Lymphocytes Relative 07/05/2019 27  % Final  . Lymphs Abs 07/05/2019 2.1  0.7 - 4.0 K/uL Final  . Monocytes Relative 07/05/2019 8  % Final  . Monocytes Absolute 07/05/2019 0.6  0.1 - 1.0 K/uL Final  . Eosinophils Relative 07/05/2019 2  % Final  . Eosinophils Absolute 07/05/2019 0.1  0.0 - 0.5 K/uL Final  . Basophils Relative 07/05/2019 1  % Final  . Basophils Absolute 07/05/2019 0.1  0.0 - 0.1 K/uL Final  . Immature Granulocytes 07/05/2019 0  % Final  . 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 She has a 60+ pack year smoking history. She denies sleep apnea.   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 Last colonoscopywas 10/2012 [phone] per patient). She has lost 120 pounds in 3 years with portion control and food choices.  Work-upon 01/04/2017 revealed a hematocrit of 50.8, hemoglobin 17, MCV 94.7, platelets 173,000, white count 7400 with an ANC of 5100. Differential was normal. Normal labs included CMP, epo level (10.7), ferritin (27), with an iron saturation (6%), JAK2 V617F and exons 12-15 were negative. BCR-ABL testing was negative for e1a2 (p190), e13a2 (b2a2, p210)  and e14a2 (b3a2, p210) fusion transcripts. Carbon  monoxide levelwas 17% (<9% smokers).   Low dose chest CT on 03/16/2017 was lung-RADS 2, benign appearance or behavior. There was left main and two-vessel coronary atherosclerosis. There was a 4.2 cm ascending thoracic aortic aneurysmwith recommendation for annual imaging followup by CTA or MRA. 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 There was a multinodular goiter. Suspected dominant 4.5 cm inferior left thyroid lobe nodule. There was a small to moderate hiatal hernia. Low dose chest CTon 03/20/2018 revealed benign appearance or behavior. Lung-RADS score was 2.  Thyroid ultrasound on 04/03/2017 revealed 7 nodules. Left mid thyroid nodule (labeled 4) met criteria for biopsy. The left inferior thyroid nodule (labeled 7) would meet criteria for biopsy, however, was favored to be a pseudo nodule and extension of thyroid goiter into the mediastinum given the appearance on prior CT. Left thyroid nodules (labeled 5 and 6) both met criteria for surveillance. None of the right-sided nodules met criteria for surveillance or biopsy.Thyroid ultrasoundon 09/10/2018 revealed thyromegaly with bilateral nodules, stable. None metcriteria for biopsy. Recommended annual/biennial ultrasound follow-up   FNA thyroid biopsy x 2 on 04/20/2017 of the left mid thyroid nodule and left inferior thyroid nodule were benign (Bethesda category II) and c/w benign follicular nodule.   She began a phlebotomy programon 02/08/2017 (last 10/12/2018). She undergoes small volume phlebotomy (300 cc)if her hematocrit is >47.  She was admitted to [facility] from 01/11/2019 - 01/13/2019 then transferred to [facility] from 01/13/2019 - 01/18/2019 for GI bleeding.  Hemoglobin was 5.9.  EGD on 01/12/2019 revealed red blood in the distal esophagus and large blood clots in the gastric fundus. Hemospray was used to treat the distal esophagus and gastric fundus.  She received  6 units of PRBCs.  She received a Protonix drip. She was transferred to [facility] for vascular intervention.  EGD on 01/14/2019 revealed severe (LA grade D) esophagitis with bleeding.  There were esophageal ulcers with active bleeding.  Hemostatic clip was placed.There was erythematous mucosa in the cardia, gastric fundus, gastric body, incisura, antrum, and prepyloric region of the stomach and pylorus.  She received 1 unit of PRBCs on 01/17/2019.  She has iron deficiency anemia s/p massive GI bleeding.  Labs on 01/11/2019 revealed a ferritin 26 with an iron saturation 12% with a TIBC 247.  B12 was 173 (low) and folate 10.3 (normal).  Hemolysis evaluation (Coombs, haptoglobin, LDH) was normal.  Retic was 4.8%.  She received weekly Venofer x 3 (02/18/2019 - 03/04/2019).   She has B12 deficiency.  B12 was 173 on 01/11/2019.  She receives monthly B12 injections (began 01/14/2019; last 06/06/2019).  She has a history of carcinoid tumor.  Chromogranin was 926.7 (0-101.8) on 02/13/2019 and 330.8 on 05/09/2019.   24 hour urine for 5HIAA was 2.4 mg/24 hours (0-14.9) on 03/17/2019.  She received her first COVID-19 vaccine at the end of 06/2019; she is due for her second shot next week.   Symptomatically, she feels good.  She stopped smoking 7 month ago.  She has stable shortness of breath.  She is on oxygen 2 L/min.  Exam is stable.  Plan: 1.   Review labs from 07/05/2019. 2.   Secondary erythrocytosis  She stopped smoking 7 months ago.  Hematocrit 37.2  Hemoglobin 11.0.  MCV 90.1 on 03/14/2019.  Hematocrit 44.5  Hemoglobin 14.0.  MCV 90.6 on 07/05/2019.  Continue to monitor. 3.  History of GI bleed             EGD on 01/14/2019 revealed severe esophagitis  with ulcers and active bleeding.             She remains on Protonix.  Anticipate repeat upper endoscopy.  Continue to monitor. 4.   Iron deficiency             Hematocrit 44.5  Hemoglobin 14.0.  MCV 90.6 on 07/05/2019.             Ferritin  27 on 07/05/2019.             She completed Venofer x 3 on 03/04/2019.  No Venofer needed.  Continue to monitor. 5.   B12 deficiency             B12 was 173 on 01/11/2019.             She received B12 on 06/06/2019.             B12 and monthly x 6.  Monitor folate annually. 6.  History of carcinoid tumor             Chromogranin was 926.7 (high) on 02/13/2019.  Unclear if elevation due to recurrent carcinoid, chronic atrophic gastritis or PPI.  24 hour urine for 5HIAA was 2.4 mg/24 hours (0-14.9).  No current plan for octreotide scan vs DOTATE PET scan.  Continue to monitor. 7.   Thyroid nodules and low TSH TSH was 1.182 and free T4 was 0.97 on 07/05/2019.  Thyroid ultrasoundon 09/10/2018 revealed stable bilateral thyroid nodules. Thyroid ultrasound planned in 08/2019 8.     Left ovarian cyst  Patient denies any abdominal symptoms.  CA125 was 4.3 on 07/05/2019.  Transabdominal and transvaginal ultrasound of pelvis on 04/22/2019 revealed a peripheral 1.7 x 1.0 x 1.0 cm cystic lesion in the left ovary with a single internal septation.   Discuss plan for follow-up ultrasound on 08/16/2019. 9.   Pulmonary nodules  Chest CT on 04/19/2019 was personally reviewed.  Agree with radiology interpretation.    There is a new 8 mm left upper lobe nodule and is well as a 7 mm right upper lobe nodule   Discuss importance of short-term follow-up given smoking history   10.Weight loss Weight is up 2 pounds. Goal weight is 145 pounds. Continue to monitor. 11.   RTC in 2 months for labs (CBC with diff, ferritin) and B12. 12.   RTC in 4 months for MD assessment, labs (CBC with diff, ferritin, iron studies, chromogranin- day before), B12 and +/- Venofer.  I discussed the assessment and treatment plan with the patient.  The patient was provided an opportunity to ask questions and all were answered.  The patient agreed with the plan and  demonstrated an understanding of the instructions.  The patient was advised to call back if the symptoms worsen or if the condition fails to improve as anticipated.   Lequita Asal, MD, PhD    07/08/2019, 9:44 AM  I, Jacqualyn Posey, am acting as a Education administrator for [employer]. Mike Gip, MD.   I, Yasiel Goyne C. Mike Gip, MD, have reviewed the above documentation for accuracy and completeness, and I agree with the above.

## 2019-07-08 NOTE — Telephone Encounter (Signed)
Smoking history: former, quit 01/2019, 41 pack year

## 2019-07-08 NOTE — Addendum Note (Signed)
Addended by: Lieutenant Diego on: 07/08/2019 11:53 AM   Modules accepted: Orders

## 2019-07-18 ENCOUNTER — Other Ambulatory Visit: Payer: Self-pay

## 2019-07-18 ENCOUNTER — Ambulatory Visit
Admission: RE | Admit: 2019-07-18 | Discharge: 2019-07-18 | Disposition: A | Discharge status: Home / Self Care | Payer: Medicare HMO | Source: Ambulatory Visit | Attending: Oncology | Admitting: Oncology

## 2019-07-18 DIAGNOSIS — Z87891 Personal history of nicotine dependence: Secondary | ICD-10-CM | POA: Diagnosis present

## 2019-07-18 DIAGNOSIS — R918 Other nonspecific abnormal finding of lung field: Secondary | ICD-10-CM | POA: Diagnosis present

## 2019-07-23 ENCOUNTER — Encounter: Payer: Self-pay | Admitting: *Deleted

## 2019-08-01 ENCOUNTER — Inpatient Hospital Stay: Payer: Medicare HMO

## 2019-08-07 ENCOUNTER — Inpatient Hospital Stay: Payer: Medicare HMO | Attending: Hematology and Oncology

## 2019-08-12 ENCOUNTER — Inpatient Hospital Stay: Payer: Medicare HMO | Attending: Hematology and Oncology

## 2019-08-12 ENCOUNTER — Other Ambulatory Visit: Payer: Self-pay

## 2019-08-12 VITALS — BP 123/77 | HR 71 | Temp 98.4°F | Resp 18

## 2019-08-12 DIAGNOSIS — E538 Deficiency of other specified B group vitamins: Secondary | ICD-10-CM | POA: Insufficient documentation

## 2019-08-12 DIAGNOSIS — D751 Secondary polycythemia: Secondary | ICD-10-CM

## 2019-08-12 MED ORDER — CYANOCOBALAMIN 1000 MCG/ML IJ SOLN
1000.0000 ug | Freq: Once | INTRAMUSCULAR | Status: AC
  Administered 2019-08-12: 1000 ug via INTRAMUSCULAR

## 2019-08-16 ENCOUNTER — Other Ambulatory Visit: Payer: Medicare HMO

## 2019-08-16 ENCOUNTER — Ambulatory Visit: Payer: Medicare HMO | Admitting: Obstetrics and Gynecology

## 2019-09-04 ENCOUNTER — Other Ambulatory Visit: Payer: Self-pay

## 2019-09-04 ENCOUNTER — Inpatient Hospital Stay: Payer: Medicare HMO | Attending: Hematology and Oncology

## 2019-09-04 ENCOUNTER — Inpatient Hospital Stay: Payer: Medicare HMO

## 2019-09-04 DIAGNOSIS — E611 Iron deficiency: Secondary | ICD-10-CM | POA: Insufficient documentation

## 2019-09-04 DIAGNOSIS — D751 Secondary polycythemia: Secondary | ICD-10-CM

## 2019-09-04 DIAGNOSIS — E538 Deficiency of other specified B group vitamins: Secondary | ICD-10-CM | POA: Diagnosis present

## 2019-09-04 LAB — CBC WITH DIFFERENTIAL/PLATELET
Abs Immature Granulocytes: 0.03 10*3/uL (ref 0.00–0.07)
Basophils Absolute: 0.1 10*3/uL (ref 0.0–0.1)
Basophils Relative: 1 %
Eosinophils Absolute: 0.3 10*3/uL (ref 0.0–0.5)
Eosinophils Relative: 4 %
HCT: 42.4 % (ref 36.0–46.0)
Hemoglobin: 13.4 g/dL (ref 12.0–15.0)
Immature Granulocytes: 0 %
Lymphocytes Relative: 24 %
Lymphs Abs: 1.7 10*3/uL (ref 0.7–4.0)
MCH: 29.8 pg (ref 26.0–34.0)
MCHC: 31.6 g/dL (ref 30.0–36.0)
MCV: 94.2 fL (ref 80.0–100.0)
Monocytes Absolute: 0.6 10*3/uL (ref 0.1–1.0)
Monocytes Relative: 8 %
Neutro Abs: 4.6 10*3/uL (ref 1.7–7.7)
Neutrophils Relative %: 63 %
Platelets: 166 10*3/uL (ref 150–400)
RBC: 4.5 MIL/uL (ref 3.87–5.11)
RDW: 14.7 % (ref 11.5–15.5)
WBC: 7.2 10*3/uL (ref 4.0–10.5)
nRBC: 0 % (ref 0.0–0.2)

## 2019-09-04 LAB — FERRITIN: Ferritin: 18 ng/mL (ref 11–307)

## 2019-09-04 MED ORDER — CYANOCOBALAMIN 1000 MCG/ML IJ SOLN
INTRAMUSCULAR | Status: AC
  Filled 2019-09-04: qty 1

## 2019-09-04 MED ORDER — CYANOCOBALAMIN 1000 MCG/ML IJ SOLN
1000.0000 ug | Freq: Once | INTRAMUSCULAR | Status: AC
  Administered 2019-09-04: 1000 ug via INTRAMUSCULAR

## 2019-09-13 ENCOUNTER — Ambulatory Visit: Payer: Medicare HMO | Admitting: Obstetrics and Gynecology

## 2019-09-13 ENCOUNTER — Ambulatory Visit: Payer: Medicare HMO

## 2019-09-27 ENCOUNTER — Other Ambulatory Visit: Payer: Self-pay

## 2019-09-27 ENCOUNTER — Ambulatory Visit (INDEPENDENT_AMBULATORY_CARE_PROVIDER_SITE_OTHER): Payer: Medicare HMO | Admitting: Obstetrics and Gynecology

## 2019-09-27 ENCOUNTER — Other Ambulatory Visit: Payer: Self-pay | Admitting: Obstetrics and Gynecology

## 2019-09-27 ENCOUNTER — Encounter: Payer: Self-pay | Admitting: Obstetrics and Gynecology

## 2019-09-27 ENCOUNTER — Ambulatory Visit (INDEPENDENT_AMBULATORY_CARE_PROVIDER_SITE_OTHER): Payer: Medicare HMO

## 2019-09-27 VITALS — BP 120/80 | Ht 64.0 in | Wt 163.0 lb

## 2019-09-27 DIAGNOSIS — N83202 Unspecified ovarian cyst, left side: Secondary | ICD-10-CM

## 2019-09-27 NOTE — Progress Notes (Signed)
Gynecology Ultrasound Follow Up   Chief Complaint  Patient presents with  . Follow-up    u/s  Left ovarian cyst  History of Present Illness: Patient is a 69 y.o. female who presents today for ultrasound evaluation of the above .  Ultrasound demonstrates the following findings Adnexa: left ovarian cyst measuring 1.3 x 1.3 x 1.3 cm  (previously measuring 1.7 x 1.0 x 1.0 cm) Uterus: absent Additional: right ovary not visualized.  She is having no symptoms.  Past Medical History:  Diagnosis Date  . AAA (abdominal aortic aneurysm) ([place])   . Arthritis    knees, elbows  . Asthma   . Colon cancer ([place])    colon  . COPD (chronic obstructive pulmonary disease) ([place])   . GERD (gastroesophageal reflux disease)   . Hypertension   . Sleep apnea     Past Surgical History:  Procedure Laterality Date  . ABDOMINAL HYSTERECTOMY    . APPENDECTOMY    . CATARACT EXTRACTION W/PHACO Left 05/28/2019   Procedure: CATARACT EXTRACTION PHACO AND INTRAOCULAR LENS PLACEMENT (IOC) LEFT VISION BLUE 31.19 02:18.2;  Surgeon: Birder Robson, MD;  Location: [facility];  Service: Ophthalmology;  Laterality: Left;  . CATARACT EXTRACTION W/PHACO Right 06/18/2019   Procedure: CATARACT EXTRACTION PHACO AND INTRAOCULAR LENS PLACEMENT (IOC) RIGHT 34.59  02:31.7;  Surgeon: Birder Robson, MD;  Location: [facility];  Service: Ophthalmology;  Laterality: Right;  sleep apnea  . ESOPHAGOGASTRODUODENOSCOPY N/A 01/12/2019   Procedure: ESOPHAGOGASTRODUODENOSCOPY (EGD);  Surgeon: Virgel Manifold, MD;  Location: [facility] ENDOSCOPY;  Service: Endoscopy;  Laterality: N/A;  . ESOPHAGOGASTRODUODENOSCOPY (EGD) WITH PROPOFOL N/A 01/14/2019   Procedure: ESOPHAGOGASTRODUODENOSCOPY (EGD) WITH PROPOFOL;  Surgeon: [name], MD;  Location: [facility];  Service: Gastroenterology;  Laterality: N/A;  . HEMOSTASIS CLIP PLACEMENT  01/14/2019   Procedure: HEMOSTASIS CLIP PLACEMENT;  Surgeon: [name], MD;   Location: [facility];  Service: Gastroenterology;;  . KNEE SURGERY Right    fracture repair  . TONSILLECTOMY    . TUBAL LIGATION    . TUMOR EXCISION     colon    Family History  Problem Relation Age of Onset  . CAD Mother   . CAD Father     Social History   Socioeconomic History  . Marital status: Widowed    Spouse name: Not on file  . Number of children: Not on file  . Years of education: Not on file  . Highest education level: Not on file  Occupational History  . Not on file  Tobacco Use  . Smoking status: Former Smoker    Packs/day: 1.00    Years: 41.00    Pack years: 41.00    Quit date: 01/01/2019    Years since quitting: 0.7  . Smokeless tobacco: Never Used  Vaping Use  . Vaping Use: Never used  Substance and Sexual Activity  . Alcohol use: Yes    Comment: rare- couple times per year  . Drug use: Never  . Sexual activity: Not Currently    Birth control/protection: Surgical    Comment: Hysterectomy  Other Topics Concern  . Not on file  Social History Narrative  . Not on file   Social Determinants of Health   Financial Resource Strain:   . Difficulty of Paying Living Expenses: Not on file  Food Insecurity:   . Worried About Charity fundraiser in the Last Year: Not on file  . Ran Out of Food in the Last Year: Not on file  Transportation Needs:   . Film/video editor (Medical): Not on file  . Lack of Transportation (Non-Medical): Not on file  Physical Activity:   . Days of Exercise per Week: Not on file  . Minutes of Exercise per Session: Not on file  Stress:   . Feeling of Stress : Not on file  Social Connections:   . Frequency of Communication with Friends and Family: Not on file  . Frequency of Social Gatherings with Friends and Family: Not on file  . Attends Religious Services: Not on file  . Active Member of Clubs or Organizations: Not on file  . Attends Archivist Meetings: Not on file  . Marital Status: Not on file  Intimate  Partner Violence:   . Fear of Current or Ex-Partner: Not on file  . Emotionally Abused: Not on file  . Physically Abused: Not on file  . Sexually Abused: Not on file    Allergies  Allergen Reactions  . Aspirin     Hx of esophageal bleed    Prior to Admission medications   Medication Sig Start Date End Date Taking? Authorizing Provider  acetaminophen (TYLENOL) 500 MG tablet Take 1,000 mg by mouth every 6 (six) hours as needed for mild pain.   Yes [provider]  albuterol (VENTOLIN HFA) 108 (90 Base) MCG/ACT inhaler Inhale 1-2 puffs into the lungs every 4 (four) hours as needed for wheezing or shortness of breath.  07/09/18  Yes [provider]  alendronate (FOSAMAX) 70 MG tablet Take 70 mg by mouth once a week. 07/26/17  Yes [provider]  buPROPion (WELLBUTRIN XL) 150 MG 24 hr tablet Take 150 mg by mouth daily.  02/15/19  Yes [provider]  docusate sodium (COLACE) 100 MG capsule Take 100 mg by mouth daily.   Yes [provider]  ipratropium-albuterol (DUONEB) 0.5-2.5 (3) MG/3ML SOLN Take 3 mLs by nebulization every 4 (four) hours as needed. 01/05/19  Yes Fritzi Mandes, MD  lisinopril (ZESTRIL) 20 MG tablet Take 20 mg by mouth daily.    Yes [provider]  montelukast (SINGULAIR) 10 MG tablet Take 10 mg by mouth at bedtime. 11/14/16  Yes [provider]  omeprazole (PRILOSEC) 20 MG capsule Take 20 mg by mouth 2 (two) times daily. 09/02/19  Yes [provider]  oxyCODONE (OXY IR/ROXICODONE) 5 MG immediate release tablet Take 5 mg by mouth 3 (three) times daily as needed for moderate pain or breakthrough pain.    Yes [provider]  pantoprazole (PROTONIX) 40 MG tablet TAKE 1 TABLET BY MOUTH EVERY DAY 05/30/19  Yes Corcoran, Melissa C, MD  sertraline (ZOLOFT) 100 MG tablet Take 100 mg by mouth daily. 05/22/19  Yes [provider]  simvastatin (ZOCOR) 20 MG tablet Take 20 mg by mouth daily. 11/14/16  Yes  [provider]  TRELEGY ELLIPTA 100-62.5-25 MCG/INH AEPB Inhale 1 puff into the lungs daily.  02/20/19  Yes [provider]    Physical Exam BP 120/80   Ht 5\' 4"  (1.626 m)   Wt 163 lb (73.9 kg)   BMI 27.98 kg/m    General: NAD HEENT: normocephalic, anicteric Pulmonary: No increased work of breathing Extremities: no edema, erythema, or tenderness Neurologic: Grossly intact, normal gait Psychiatric: mood appropriate, affect full  Imaging Results US PELVIC COMPLETE WITH TRANSVAGINAL  Result Date: 09/27/2019 Patient Name: KARRIGAN MESSAMORE DOB: August 26, 1950 MRN: [id number] ULTRASOUND REPORT Location: [facility] Date of Service: 09/27/2019 Indications:evaluate left ovary for a  cyst Findings: The uterus is absent. Right Ovary is not visualized. Left Ovary measures 2.9 x 2.6 x 2.0 cm. It is normal in appearance. There is a small simple cyst in the left ovary measuring 13.3 x 13.4 x 13.8 mm. Survey of the adnexa demonstrates no adnexal masses. There is no free fluid in the cul de sac. PV Bladder volume: 128 cc Impression: 1. The uterus and cervix have been removed. 2. The right ovary is not visible. 3. The left ovary has a small simple cyst. 4. There is 128 cc of urine in the bladder postvoid. Gweneth Dimitri, RT The ultrasound images and findings were reviewed by me and I agree with the above report. Prentice Docker, MD, [facility], [facility] 09/27/2019 4:49 PM       Assessment: 69 y.o. [id number]  1. Left ovarian cyst      Plan: Problem List Items Addressed This Visit      Endocrine   Left ovarian cyst - Primary     Cyst is stable from 3 months ago. It is simple in appearance.  Follow up in 1 year.  Rationale: 1. Cysts ?1 cm: Are clinically inconsequential; at the discretion of the interpreting physician whether or not to describe them in the imaging report; do not need follow-up. 2. Cysts >1 and ?7 cm: Should be described in the imaging  report with statement that they are almost certainly benign; yearly follow-up, at least initially, with [place] recommended. Some practices may opt to increase the lower size threshold for follow-up from 1 cm to as high as 3 cm. One may opt to continue follow-up annually or to decrease the frequency of follow-up once stability or decrease in size has been confirmed. Cysts in the larger end of this range should still generally be followed on a regular basis. 3. Cysts >7 cm: Since these may be difficult to assess completely with [place], further imaging with MR or surgical evaluation should be considered.  Gordy Levan et al. Management of Asymptomatic Ovarian and Other Adnexal Cysts Imaged at [place]: Society of Radiologists in [place] Statement 2010. Radiology 256 (Sept 2010): 943-954.).  A total of 20 minutes were spent face-to-face with the patient as well as preparation, review, communication, and documentation during this encounter.    Prentice Docker, MD, [facility], [facility] 09/27/2019 4:51 PM    CC: Ellamae Sia, MD [place],  [place] [address]

## 2019-10-02 ENCOUNTER — Inpatient Hospital Stay: Payer: Medicare HMO | Attending: Hematology and Oncology

## 2019-10-02 ENCOUNTER — Other Ambulatory Visit: Payer: Self-pay

## 2019-10-02 DIAGNOSIS — E538 Deficiency of other specified B group vitamins: Secondary | ICD-10-CM | POA: Diagnosis not present

## 2019-10-02 DIAGNOSIS — D751 Secondary polycythemia: Secondary | ICD-10-CM

## 2019-10-02 MED ORDER — CYANOCOBALAMIN 1000 MCG/ML IJ SOLN
1000.0000 ug | Freq: Once | INTRAMUSCULAR | Status: AC
  Administered 2019-10-02: 1000 ug via INTRAMUSCULAR

## 2019-10-02 MED ORDER — CYANOCOBALAMIN 1000 MCG/ML IJ SOLN
INTRAMUSCULAR | Status: AC
  Filled 2019-10-02: qty 1

## 2019-11-07 ENCOUNTER — Other Ambulatory Visit: Payer: Medicare HMO

## 2019-11-08 ENCOUNTER — Ambulatory Visit: Payer: Medicare HMO | Admitting: Hematology and Oncology

## 2019-11-08 ENCOUNTER — Ambulatory Visit: Payer: Medicare HMO

## 2019-11-11 ENCOUNTER — Other Ambulatory Visit: Payer: Self-pay

## 2019-11-11 ENCOUNTER — Inpatient Hospital Stay: Payer: Medicare HMO | Attending: Hematology and Oncology

## 2019-11-11 DIAGNOSIS — E538 Deficiency of other specified B group vitamins: Secondary | ICD-10-CM | POA: Insufficient documentation

## 2019-11-11 DIAGNOSIS — I2510 Atherosclerotic heart disease of native coronary artery without angina pectoris: Secondary | ICD-10-CM | POA: Diagnosis not present

## 2019-11-11 DIAGNOSIS — I10 Essential (primary) hypertension: Secondary | ICD-10-CM | POA: Insufficient documentation

## 2019-11-11 DIAGNOSIS — G4730 Sleep apnea, unspecified: Secondary | ICD-10-CM | POA: Diagnosis not present

## 2019-11-11 DIAGNOSIS — Z79899 Other long term (current) drug therapy: Secondary | ICD-10-CM | POA: Diagnosis not present

## 2019-11-11 DIAGNOSIS — Z85038 Personal history of other malignant neoplasm of large intestine: Secondary | ICD-10-CM | POA: Diagnosis not present

## 2019-11-11 DIAGNOSIS — D751 Secondary polycythemia: Secondary | ICD-10-CM | POA: Insufficient documentation

## 2019-11-11 DIAGNOSIS — E042 Nontoxic multinodular goiter: Secondary | ICD-10-CM | POA: Insufficient documentation

## 2019-11-11 DIAGNOSIS — D509 Iron deficiency anemia, unspecified: Secondary | ICD-10-CM | POA: Diagnosis not present

## 2019-11-11 DIAGNOSIS — J449 Chronic obstructive pulmonary disease, unspecified: Secondary | ICD-10-CM | POA: Insufficient documentation

## 2019-11-11 DIAGNOSIS — Z87891 Personal history of nicotine dependence: Secondary | ICD-10-CM | POA: Diagnosis not present

## 2019-11-11 LAB — CBC WITH DIFFERENTIAL/PLATELET
Abs Immature Granulocytes: 0.03 10*3/uL (ref 0.00–0.07)
Basophils Absolute: 0.1 10*3/uL (ref 0.0–0.1)
Basophils Relative: 1 %
Eosinophils Absolute: 0.4 10*3/uL (ref 0.0–0.5)
Eosinophils Relative: 5 %
HCT: 42.8 % (ref 36.0–46.0)
Hemoglobin: 13.6 g/dL (ref 12.0–15.0)
Immature Granulocytes: 0 %
Lymphocytes Relative: 25 %
Lymphs Abs: 2.1 10*3/uL (ref 0.7–4.0)
MCH: 29.6 pg (ref 26.0–34.0)
MCHC: 31.8 g/dL (ref 30.0–36.0)
MCV: 93 fL (ref 80.0–100.0)
Monocytes Absolute: 0.7 10*3/uL (ref 0.1–1.0)
Monocytes Relative: 8 %
Neutro Abs: 5.2 10*3/uL (ref 1.7–7.7)
Neutrophils Relative %: 61 %
Platelets: 195 10*3/uL (ref 150–400)
RBC: 4.6 MIL/uL (ref 3.87–5.11)
RDW: 13.9 % (ref 11.5–15.5)
WBC: 8.5 10*3/uL (ref 4.0–10.5)
nRBC: 0 % (ref 0.0–0.2)

## 2019-11-11 LAB — IRON AND TIBC
Iron: 74 ug/dL (ref 28–170)
Saturation Ratios: 19 % (ref 10.4–31.8)
TIBC: 388 ug/dL (ref 250–450)
UIBC: 314 ug/dL

## 2019-11-11 LAB — FERRITIN: Ferritin: 18 ng/mL (ref 11–307)

## 2019-11-12 ENCOUNTER — Inpatient Hospital Stay (HOSPITAL_BASED_OUTPATIENT_CLINIC_OR_DEPARTMENT_OTHER): Payer: Medicare HMO | Admitting: Hematology and Oncology

## 2019-11-12 ENCOUNTER — Encounter: Payer: Self-pay | Admitting: Hematology and Oncology

## 2019-11-12 ENCOUNTER — Inpatient Hospital Stay: Payer: Medicare HMO

## 2019-11-12 VITALS — BP 124/78 | HR 77 | Temp 98.2°F | Resp 18 | Ht 64.0 in | Wt 173.6 lb

## 2019-11-12 DIAGNOSIS — D500 Iron deficiency anemia secondary to blood loss (chronic): Secondary | ICD-10-CM | POA: Diagnosis not present

## 2019-11-12 DIAGNOSIS — E041 Nontoxic single thyroid nodule: Secondary | ICD-10-CM | POA: Diagnosis not present

## 2019-11-12 DIAGNOSIS — N83202 Unspecified ovarian cyst, left side: Secondary | ICD-10-CM

## 2019-11-12 DIAGNOSIS — D751 Secondary polycythemia: Secondary | ICD-10-CM | POA: Diagnosis not present

## 2019-11-12 DIAGNOSIS — Z7189 Other specified counseling: Secondary | ICD-10-CM

## 2019-11-12 DIAGNOSIS — R634 Abnormal weight loss: Secondary | ICD-10-CM

## 2019-11-12 DIAGNOSIS — E538 Deficiency of other specified B group vitamins: Secondary | ICD-10-CM

## 2019-11-12 LAB — CHROMOGRANIN A: Chromogranin A (ng/mL): 470.9 ng/mL — ABNORMAL HIGH (ref 0.0–101.8)

## 2019-11-12 MED ORDER — CYANOCOBALAMIN 1000 MCG/ML IJ SOLN
INTRAMUSCULAR | Status: AC
  Filled 2019-11-12: qty 1

## 2019-11-12 MED ORDER — CYANOCOBALAMIN 1000 MCG/ML IJ SOLN
1000.0000 ug | Freq: Once | INTRAMUSCULAR | Status: AC
  Administered 2019-11-12: 1000 ug via INTRAMUSCULAR

## 2019-11-12 NOTE — Progress Notes (Signed)
[facility]  [address] [place], [place] [address] Phone: [phone]  Fax: [phone]   Clinic Day:  11/12/2019  Referring physician: Ellamae Sia, MD  Chief Complaint: Alicia Holder is a 69 y.o. female with h/o carcinoid tumor, secondary polycythemia, iron deficiency s/p GI bleed and B12 deficiency who is seen for 4 month assessment.  HPI:  The patient was last seen in the hematology clinic on 07/08/2019. At that time, she felt good.  She stopped smoking 7 month prior.  She had stable shortness of breath.  She was on oxygen 2 L/min.  Exam was stable. Hematocrit was 44.5, hemoglobin 14.0, platelets 208,000, WBC 7,800. Ferritin was 27 with an iron saturation of 15% and a TIBC of 421. CA125 was 4.3.  TSH was 1.182 and free T4 was 0.97.  Low dose chest CT on 07/18/2019 revealed Lung-RADS 2S, benign appearance or behavior. There was aortic atherosclerosis, in addition to 2 vessel coronary artery disease. There was mild diffuse bronchial wall thickening with mild centrilobular and paraseptal emphysema; imaging findings suggestive of underlying COPD. There were calcifications of the aortic valve and mitral annulus.  Transvaginal pelvic ultrasound on 09/27/2019 revealed a small simple cyst on the left ovary. There was 128 cc of urine in the bladder postvoid. The right ovary was not visible. The uterus and cervix has been removed. The patient was told to follow up in 1 year.  Labs on 09/04/2019 revealed a hematocrit of 42.4, hemoglobin 13.4, platelets 166,000, WBC 7,200.  Ferritin was 18.  She received monthly vitamin B12 injections x 4 (07/08/2019 - 10/02/2019).  During the interim, she has been "great."  She quit smoking 10.5 months ago. Her breathing has improved and she has been taken off of oxygen. She eats red meat once a week, fish once a week, and is vegetarian the rest of the week. Her joint symptoms are stable.  She has not had her thyroid  ultrasound.  Past Medical History:  Diagnosis Date  . AAA (abdominal aortic aneurysm) ([facility])   . Arthritis    knees, elbows  . Asthma   . Colon cancer ([place])    colon  . COPD (chronic obstructive pulmonary disease) ([place])   . GERD (gastroesophageal reflux disease)   . Hypertension   . Sleep apnea     Past Surgical History:  Procedure Laterality Date  . ABDOMINAL HYSTERECTOMY    . APPENDECTOMY    . CATARACT EXTRACTION W/PHACO Left 05/28/2019   Procedure: CATARACT EXTRACTION PHACO AND INTRAOCULAR LENS PLACEMENT (IOC) LEFT VISION BLUE 31.19 02:18.2;  Surgeon: Birder Robson, MD;  Location: [place];  Service: Ophthalmology;  Laterality: Left;  . CATARACT EXTRACTION W/PHACO Right 06/18/2019   Procedure: CATARACT EXTRACTION PHACO AND INTRAOCULAR LENS PLACEMENT (IOC) RIGHT 34.59  02:31.7;  Surgeon: Birder Robson, MD;  Location: [place];  Service: Ophthalmology;  Laterality: Right;  sleep apnea  . ESOPHAGOGASTRODUODENOSCOPY N/A 01/12/2019   Procedure: ESOPHAGOGASTRODUODENOSCOPY (EGD);  Surgeon: Virgel Manifold, MD;  Location[facility];  Service: Endoscopy;  Laterality: N/A;  . ESOPHAGOGASTRODUODENOSCOPY (EGD) WITH PROPOFOL N/A 01/14/2019   Procedure: ESOPHAGOGASTRODUODENOSCOPY (EGD) WITH PROPOFOL;  Surgeon: [name], MD;  Location: [place];  Service: Gastroenterology;  Laterality: N/A;  . HEMOSTASIS CLIP PLACEMENT  01/14/2019   Procedure: HEMOSTASIS CLIP PLACEMENT;  Surgeon: [name], MD;  Location: [place];  Service: Gastroenterology;;  . KNEE SURGERY Right    fracture repair  . TONSILLECTOMY    . TUBAL LIGATION    .  TUMOR EXCISION     colon    Family History  Problem Relation Age of Onset  . CAD Mother   . CAD Father     Social History:  reports that she quit smoking about 10 months ago. She has a 41.00 pack-year smoking history. She has never used smokeless tobacco. She reports current alcohol use. She reports that she does  not use drugs. She is a great-grandmother. She quit smoking 10.5 months ago. The patient is alone today.   Allergies:  Allergies  Allergen Reactions  . Aspirin     Hx of esophageal bleed    Current Medications: Current Outpatient Medications  Medication Sig Dispense Refill  . acetaminophen (TYLENOL) 500 MG tablet Take 1,000 mg by mouth every 6 (six) hours as needed for mild pain.    Marland Kitchen albuterol (VENTOLIN HFA) 108 (90 Base) MCG/ACT inhaler Inhale 1-2 puffs into the lungs every 4 (four) hours as needed for wheezing or shortness of breath.     Marland Kitchen alendronate (FOSAMAX) 70 MG tablet Take 70 mg by mouth once a week.    Marland Kitchen buPROPion (WELLBUTRIN XL) 150 MG 24 hr tablet Take 150 mg by mouth daily.     Marland Kitchen docusate sodium (COLACE) 100 MG capsule Take 100 mg by mouth daily.    Marland Kitchen ipratropium-albuterol (DUONEB) 0.5-2.5 (3) MG/3ML SOLN Take 3 mLs by nebulization every 4 (four) hours as needed. 360 mL 0  . lisinopril (ZESTRIL) 20 MG tablet Take 20 mg by mouth daily.     . montelukast (SINGULAIR) 10 MG tablet Take 10 mg by mouth at bedtime.    Marland Kitchen omeprazole (PRILOSEC) 20 MG capsule Take 20 mg by mouth 2 (two) times daily.    Marland Kitchen oxyCODONE (OXY IR/ROXICODONE) 5 MG immediate release tablet Take 5 mg by mouth 3 (three) times daily as needed for moderate pain or breakthrough pain.     . pantoprazole (PROTONIX) 40 MG tablet TAKE 1 TABLET BY MOUTH EVERY DAY 90 tablet 0  . sertraline (ZOLOFT) 100 MG tablet Take 100 mg by mouth daily.    . simvastatin (ZOCOR) 20 MG tablet Take 20 mg by mouth daily.    . TRELEGY ELLIPTA 100-62.5-25 MCG/INH AEPB Inhale 1 puff into the lungs daily.      No current facility-administered medications for this visit.    Review of Systems  Constitutional: Negative for chills, diaphoresis, fever, malaise/fatigue and weight loss (up 14 lbs).       Feels "great."  HENT: Negative.  Negative for congestion, ear discharge, ear pain, hearing loss, nosebleeds, sinus pain, sore throat and  tinnitus.   Eyes: Negative.  Negative for blurred vision and double vision.  Respiratory: Negative for cough, hemoptysis, sputum production and shortness of breath.        No longer needs oxygen.  Cardiovascular: Negative.  Negative for chest pain, palpitations, orthopnea and PND.  Gastrointestinal: Negative.  Negative for abdominal pain, blood in stool, constipation, diarrhea, heartburn, melena, nausea and vomiting.  Genitourinary: Negative.  Negative for dysuria, frequency, hematuria and urgency.  Musculoskeletal: Positive for joint pain (chronic right knee pain, unchanged). Negative for back pain, myalgias and neck pain.  Skin: Negative.  Negative for rash.  Neurological: Negative.  Negative for dizziness, tingling, sensory change, focal weakness, weakness and headaches.  Endo/Heme/Allergies: Negative.  Does not bruise/bleed easily.  Psychiatric/Behavioral: Negative.  Negative for depression and memory loss. The patient is not nervous/anxious and does not have insomnia.   All other systems reviewed and are  negative.  Performance status (ECOG): 1 - Symptomatic but completely ambulatory   Vitals Blood pressure 124/78, pulse 77, temperature 98.2 F (36.8 C), temperature source Tympanic, resp. rate 18, height '5\' 4"'  (1.626 m), weight 173 lb 9.8 oz (78.8 kg), SpO2 96 %.   Physical Exam Vitals and nursing note reviewed.  Constitutional:      General: She is not in acute distress.    Appearance: She is well-developed. She is not diaphoretic.     Comments: Energy much improved.  HENT:     Head: Normocephalic and atraumatic.     Mouth/Throat:     Mouth: Mucous membranes are moist.     Pharynx: Oropharynx is clear. No oropharyngeal exudate.  Eyes:     General: No scleral icterus.    Extraocular Movements: Extraocular movements intact.     Conjunctiva/sclera: Conjunctivae normal.     Pupils: Pupils are equal, round, and reactive to light.     Comments: Blue eyes.  Neck:     Vascular: No  JVD.  Cardiovascular:     Rate and Rhythm: Normal rate and regular rhythm.     Heart sounds: Normal heart sounds. No murmur heard.   Pulmonary:     Effort: Pulmonary effort is normal. No respiratory distress.     Breath sounds: Normal breath sounds. No wheezing or rales.  Abdominal:     General: Bowel sounds are normal. There is no distension.     Palpations: Abdomen is soft. There is no mass.     Tenderness: There is no abdominal tenderness. There is no guarding or rebound.  Musculoskeletal:        General: No swelling or tenderness. Normal range of motion.     Cervical back: Normal range of motion and neck supple.  Lymphadenopathy:     Head:     Right side of head: No preauricular, posterior auricular or occipital adenopathy.     Left side of head: No preauricular, posterior auricular or occipital adenopathy.     Cervical: No cervical adenopathy.     Upper Body:     Right upper body: No supraclavicular or axillary adenopathy.     Left upper body: No supraclavicular or axillary adenopathy.     Lower Body: No right inguinal adenopathy. No left inguinal adenopathy.  Skin:    General: Skin is warm and dry.     Coloration: Skin is not pale.  Neurological:     Mental Status: She is alert and oriented to person, place, and time.  Psychiatric:        Behavior: Behavior normal.        Thought Content: Thought content normal.        Judgment: Judgment normal.     Appointment on 11/11/2019  Component Date Value Ref Range Status  . Iron 11/11/2019 74  28 - 170 ug/dL Final  . TIBC 11/11/2019 388  250 - 450 ug/dL Final  . Saturation Ratios 11/11/2019 19  10.4 - 31.8 % Final  . UIBC 11/11/2019 314  ug/dL Final   Performed at [facility], [address], [place], [place] [address]  . Ferritin 11/11/2019 18  11 - 307 ng/mL Final   Performed at [facility], [address] [place], [place] [address]  . WBC 11/11/2019 8.5  4.0 - 10.5 K/uL Final  . RBC 11/11/2019  4.60  3.87 - 5.11 MIL/uL Final  . Hemoglobin 11/11/2019 13.6  12.0 - 15.0 g/dL Final  . HCT 11/11/2019 42.8  36 - 46 % Final  . MCV 11/11/2019 93.0  80.0 - 100.0 fL Final  . MCH 11/11/2019 29.6  26.0 - 34.0 pg Final  . MCHC 11/11/2019 31.8  30.0 - 36.0 g/dL Final  . RDW 11/11/2019 13.9  11.5 - 15.5 % Final  . Platelets 11/11/2019 195  150 - 400 K/uL Final  . nRBC 11/11/2019 0.0  0.0 - 0.2 % Final  . Neutrophils Relative % 11/11/2019 61  % Final  . Neutro Abs 11/11/2019 5.2  1.7 - 7.7 K/uL Final  . Lymphocytes Relative 11/11/2019 25  % Final  . Lymphs Abs 11/11/2019 2.1  0.7 - 4.0 K/uL Final  . Monocytes Relative 11/11/2019 8  % Final  . Monocytes Absolute 11/11/2019 0.7  0.1 - 1.0 K/uL Final  . Eosinophils Relative 11/11/2019 5  % Final  . Eosinophils Absolute 11/11/2019 0.4  0.0 - 0.5 K/uL Final  . Basophils Relative 11/11/2019 1  % Final  . Basophils Absolute 11/11/2019 0.1  0.0 - 0.1 K/uL Final  . Immature Granulocytes 11/11/2019 0  % Final  . 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 She has a 60+ pack year smoking history. She denies sleep apnea.   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 Last colonoscopywas 10/2012 [phone] per patient). She has lost 120 pounds in 3 years with portion control and food choices.  Work-upon 01/04/2017 revealed a hematocrit of 50.8, hemoglobin 17, MCV 94.7, platelets 173,000, white count 7400 with an ANC of 5100. Differential was normal. Normal labs included CMP, epo level (10.7), ferritin (27), with an iron saturation (6%), JAK2 V617F and exons 12-15 were negative. BCR-ABL testing was negative for e1a2 (p190), e13a2 (b2a2, p210) and e14a2 (b3a2, p210) fusion transcripts. Carbon monoxide levelwas 17% (<9% smokers).    Low dose chest CT on 03/16/2017 was lung-RADS 2, benign appearance or behavior. There was left main and two-vessel coronary atherosclerosis. There was a 4.2 cm ascending thoracic aortic aneurysmwith recommendation for annual imaging followup by CTA or MRA. 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 There was a multinodular goiter. Suspected dominant 4.5 cm inferior left thyroid lobe nodule. There was a small to moderate hiatal hernia. Low dose chest CTon 03/20/2018 revealed benign appearance or behavior. Lung-RADS score was 2.  Low dose chest CT on 04/19/2019 revealed a new 8 mm left upper lobe nodule and a 7 mm right upper lobe nodule.  Low dose chest CT on 07/18/2019 revealed Lung-RADS 2S, benign appearance or behavior. There was aortic atherosclerosis, in addition to 2 vessel coronary artery disease. There was mild diffuse bronchial wall thickening with mild centrilobular and paraseptal emphysema; imaging findings suggestive of underlying COPD. There were calcifications of the aortic valve and mitral annulus.  Thyroid ultrasound on 04/03/2017 revealed 7 nodules. Left mid thyroid nodule (labeled 4) met criteria for biopsy. The left inferior thyroid nodule (labeled 7) would meet criteria for biopsy, however, was favored to be a pseudo nodule and extension of thyroid goiter into the mediastinum given the appearance on prior CT. Left thyroid nodules (labeled 5 and 6) both met criteria for surveillance. None of the right-sided nodules met criteria for surveillance or biopsy.Thyroid ultrasoundon 09/10/2018 revealed thyromegaly with bilateral nodules, stable. None metcriteria for biopsy. Recommended annual/biennial ultrasound follow-up  FNA thyroid biopsy x 2 on 04/20/2017 of the left mid thyroid nodule and left inferior thyroid nodule were benign (Bethesda category II) and c/w benign follicular nodule.   She began a phlebotomy programon 02/08/2017 (last 10/12/2018). She undergoes  small volume phlebotomy (300 cc)if her hematocrit is >47.  She was admitted to [facility] from 01/11/2019 - 01/13/2019 then transferred to [facility] from 01/13/2019 - 01/18/2019 for GI bleeding.  Hemoglobin was 5.9.  EGD on 01/12/2019 revealed red blood in the distal esophagus and large blood clots in the gastric fundus. Hemospray was used to treat the distal esophagus and gastric fundus.  She received 6 units of PRBCs.  She received a Protonix drip. She was transferred to [facility] for vascular intervention.  EGD on 01/14/2019 revealed severe (LA grade D) esophagitis with bleeding.  There were esophageal ulcers with active bleeding.  Hemostatic clip was placed.There was erythematous mucosa in the cardia, gastric fundus, gastric body, incisura, antrum, and prepyloric region of the stomach and pylorus.  She received 1 unit of PRBCs on 01/17/2019.  She has iron deficiency anemia s/p massive GI bleeding.  Labs on 01/11/2019 revealed a ferritin 26 with an iron saturation 12% with a TIBC 247.  B12 was 173 (low) and folate 10.3 (normal).  Hemolysis evaluation (Coombs, haptoglobin, LDH) was normal.  Retic was 4.8%.  She received weekly Venofer x 3 (02/18/2019 - 03/04/2019).   Ferritin has been monitored: 11 on 02/14/2019, 12 on 05/09/2019, 27 on 07/05/2019, 18 on 09/04/2019, and 18 on 11/11/2019.  She has B12 deficiency.  B12 was 173 on 01/11/2019.  She receives monthly B12 injections (began 01/14/2019; last 10/02/2019).  Folate was 22 on 02/14/2019.  She has a history of carcinoid tumor.  Chromogranin was 926.7 (0-101.8) on 02/13/2019 and 330.8 on 05/09/2019.   24 hour urine for 5HIAA was 2.4 mg/24 hours (0-14.9) on 03/17/2019.  She received the Moderna COVID-19 vaccine on 06/24/2019 and 07/25/2019. She received the flu shot at the beginning of 11/2019.  Symptomatically,  She feels "great."  Her breathing has improved and she has been taken off of oxygen.  She eats red meat once a week.  Exam is  stable..  Plan: 1.   Review labs from 11/11/2019. 2.   Secondary erythrocytosis, resolved  She stopped smoking 10.5 months ago.  Hematocrit 37.2  Hemoglobin 11.0.  MCV 90.1 on 03/14/2019.  Hematocrit 44.5  Hemoglobin 14.0.  MCV 90.6 on 07/05/2019.  Hematocrit 42.8. Hemoglobin 13.6.  MCV 93.0 on 11/11/2019.  Continue to monitor. 3.  History of GI bleed             EGD on 01/14/2019 revealed severe esophagitis with ulcers and active bleeding.             She is on Protonix.  Encourage follow-up with GI.    Continue to monitor. 4.   Iron deficiency             Hematocrit 42.8 Hemoglobin 13.6.  MCV 93.0 on 02/11/2019.             Ferritin 18 with an iron saturation of 19% and a TIBC of 388.             No Alroy Bailiff for today with plans for close monitoring. 5.   B12 deficiency             B12 today and monthly x6.  Monitor folate annually. 6.  History of carcinoid tumor  Chromogranin was 926.7 (high) on 02/13/2019.  Unclear if elevation due to recurrent carcinoid, chronic atrophic gastritis or PPI.  24 hour urine for 5HIAA was 2.4 mg/24 hours (0-14.9).  No plan for octreotide scan or DOTATE PET scan.  Continue to monitor. 7.   Thyroid nodules and low TSH  TSH was 1.182 and free T4 was 0.97 on 07/05/2019.  Thyroid ultrasoundon 09/10/2018 revealed stable bilateral thyroid nodules.  Thyroid ultrasound was planned for 08/2019.  Schedule thyroid ultrasound. 8.   Left ovarian cyst  Patient denies any abdominal symptoms.  CA125 was 4.3 on 07/05/2019.  Transabdominal and transvaginal ultrasound of pelvis on 04/22/2019 revealed a peripheral 1.7 x 1.0 x 1.0 cm cystic lesion in the left ovary with a single internal septation.   Transvaginal pelvic ultrasound on 09/27/2019 revealed a small simple cyst on the left ovary. The right ovary was not visible.  Discuss plan for follow-up in 1 year. 9.   Pulmonary nodules  Low dose chest CT on 04/19/2019 revealed a new 8 mm left upper lobe  nodule and a 7 mm right upper lobe nodule   Low dose chest CT on 07/18/2019 revealed mild diffuse bronchial wall thickening with mild centrilobular and paraseptal emphysema.     Images personally reviewed.  Agree with radiology findings.  Discuss plan for follow-up low dose chest CT in on 07/17/2020. 10.Weight loss, resolved Weight up 14 pounds since 07/08/2019. Patient eating better. 11.   No Venofer today. 12.   B12 today and monthly x 6. 13.   Please reschedule thyroid ultrasound. 14.   RTC in 2 months for labs (CBC, ferritin, folate). 15.   RTC in 4 months for MD assessment, labs (CBC with diff, ferritin, iron studies, folate, CA125), and review of thyroid ultrasound.  I discussed the assessment and treatment plan with the patient.  The patient was provided an opportunity to ask questions and all were answered.  The patient agreed with the plan and demonstrated an understanding of the instructions.  The patient was advised to call back if the symptoms worsen or if the condition fails to improve as anticipated.  I provided 15 minutes of face-to-face time during this this encounter and > 50% was spent counseling as documented under my assessment and plan. An additional 10 minutes were spent reviewing her chart (Epic and Care Everywhere) including notes, labs, and imaging studies.    Lequita Asal, MD, PhD    11/12/2019, 2:20 PM  I, Mirian Mo Tufford, am acting as a Education administrator for [employer]. Mike Gip, MD.   I, Dayvon Dax C. Mike Gip, MD, have reviewed the above documentation for accuracy and completeness, and I agree with the above.

## 2019-11-12 NOTE — Progress Notes (Signed)
No new changes noted today 

## 2019-11-19 ENCOUNTER — Other Ambulatory Visit: Payer: Self-pay

## 2019-11-19 ENCOUNTER — Ambulatory Visit
Admission: RE | Admit: 2019-11-19 | Discharge: 2019-11-19 | Disposition: A | Discharge status: Home / Self Care | Payer: Medicare HMO | Source: Ambulatory Visit | Attending: Hematology and Oncology | Admitting: Hematology and Oncology

## 2019-11-19 DIAGNOSIS — D751 Secondary polycythemia: Secondary | ICD-10-CM | POA: Diagnosis not present

## 2019-11-19 DIAGNOSIS — E041 Nontoxic single thyroid nodule: Secondary | ICD-10-CM

## 2019-12-10 ENCOUNTER — Inpatient Hospital Stay: Payer: Medicare HMO | Attending: Hematology and Oncology

## 2019-12-10 ENCOUNTER — Other Ambulatory Visit: Payer: Self-pay

## 2019-12-10 DIAGNOSIS — E538 Deficiency of other specified B group vitamins: Secondary | ICD-10-CM | POA: Diagnosis present

## 2019-12-10 DIAGNOSIS — D751 Secondary polycythemia: Secondary | ICD-10-CM

## 2019-12-10 MED ORDER — CYANOCOBALAMIN 1000 MCG/ML IJ SOLN
1000.0000 ug | Freq: Once | INTRAMUSCULAR | Status: AC
  Administered 2019-12-10: 1000 ug via INTRAMUSCULAR
  Filled 2019-12-10: qty 1

## 2020-01-07 ENCOUNTER — Inpatient Hospital Stay: Payer: Medicare HMO

## 2020-01-07 ENCOUNTER — Inpatient Hospital Stay: Payer: Medicare HMO | Attending: Hematology and Oncology

## 2020-01-07 ENCOUNTER — Other Ambulatory Visit: Payer: Self-pay

## 2020-01-07 DIAGNOSIS — E538 Deficiency of other specified B group vitamins: Secondary | ICD-10-CM | POA: Insufficient documentation

## 2020-01-07 DIAGNOSIS — Z87891 Personal history of nicotine dependence: Secondary | ICD-10-CM | POA: Diagnosis not present

## 2020-01-07 DIAGNOSIS — Z85060 Personal history of malignant carcinoid tumor of small intestine: Secondary | ICD-10-CM | POA: Insufficient documentation

## 2020-01-07 DIAGNOSIS — D508 Other iron deficiency anemias: Secondary | ICD-10-CM | POA: Insufficient documentation

## 2020-01-07 DIAGNOSIS — D751 Secondary polycythemia: Secondary | ICD-10-CM | POA: Diagnosis not present

## 2020-01-07 DIAGNOSIS — D500 Iron deficiency anemia secondary to blood loss (chronic): Secondary | ICD-10-CM

## 2020-01-07 LAB — CBC
HCT: 44.7 % (ref 36.0–46.0)
Hemoglobin: 14.2 g/dL (ref 12.0–15.0)
MCH: 28.6 pg (ref 26.0–34.0)
MCHC: 31.8 g/dL (ref 30.0–36.0)
MCV: 89.9 fL (ref 80.0–100.0)
Platelets: 206 10*3/uL (ref 150–400)
RBC: 4.97 MIL/uL (ref 3.87–5.11)
RDW: 14.5 % (ref 11.5–15.5)
WBC: 8.8 10*3/uL (ref 4.0–10.5)
nRBC: 0 % (ref 0.0–0.2)

## 2020-01-07 LAB — FERRITIN: Ferritin: 16 ng/mL (ref 11–307)

## 2020-01-07 LAB — FOLATE: Folate: 29 ng/mL (ref >5.9)

## 2020-01-07 MED ORDER — CYANOCOBALAMIN 1000 MCG/ML IJ SOLN
1000.0000 ug | Freq: Once | INTRAMUSCULAR | Status: AC
  Administered 2020-01-07: 1000 ug via INTRAMUSCULAR
  Filled 2020-01-07: qty 1

## 2020-01-08 ENCOUNTER — Telehealth: Payer: Self-pay

## 2020-01-08 NOTE — Telephone Encounter (Signed)
-----   Message from Lequita Asal, MD sent at 01/08/2020  8:44 AM EST ----- Regarding: Please call patient  Hemoglobin normal (better than last visit).  Ferritin 16 (low).  Any symptoms?  If not, no plan for Venofer.  She has scheduled labs in 2 months.  M  ----- Message ----- From: Buel Ream, [facility] [place] Sent: 01/07/2020   2:18 PM EST To: Lequita Asal, MD

## 2020-01-08 NOTE — Telephone Encounter (Signed)
No symptoms, patient has improved since last visit. Advised patient to call should she start to have any symptoms before her next appt in 2 months

## 2020-02-04 ENCOUNTER — Other Ambulatory Visit: Payer: Self-pay

## 2020-02-04 ENCOUNTER — Inpatient Hospital Stay: Payer: Medicare HMO | Attending: Hematology and Oncology

## 2020-02-04 DIAGNOSIS — E538 Deficiency of other specified B group vitamins: Secondary | ICD-10-CM | POA: Insufficient documentation

## 2020-02-04 DIAGNOSIS — D508 Other iron deficiency anemias: Secondary | ICD-10-CM | POA: Insufficient documentation

## 2020-02-04 DIAGNOSIS — D751 Secondary polycythemia: Secondary | ICD-10-CM

## 2020-02-04 MED ORDER — CYANOCOBALAMIN 1000 MCG/ML IJ SOLN
1000.0000 ug | Freq: Once | INTRAMUSCULAR | Status: AC
  Administered 2020-02-04: 1000 ug via INTRAMUSCULAR
  Filled 2020-02-04: qty 1

## 2020-03-02 ENCOUNTER — Other Ambulatory Visit: Payer: Self-pay

## 2020-03-02 DIAGNOSIS — E538 Deficiency of other specified B group vitamins: Secondary | ICD-10-CM

## 2020-03-02 DIAGNOSIS — D500 Iron deficiency anemia secondary to blood loss (chronic): Secondary | ICD-10-CM

## 2020-03-02 NOTE — Progress Notes (Incomplete)
[facility]  [address] [place], [place] [address] Phone: [phone]  Fax: [phone]   Clinic Day:  03/02/2020  Referring physician: Ellamae Sia, MD  Chief Complaint: Alicia Holder is a 70 y.o. female with h/o carcinoid tumor, secondary polycythemia, iron deficiency s/p GI bleed and B12 deficiency who is seen for 4 month assessment.  HPI:  The patient was last seen in the hematology clinic on 11/12/2019. At that time, she felt "great." Her breathing had improved and she had been taken off of oxygen. She ate red meat once a week. Exam was stable. Hematocrit was 42.8, hemoglobin 13.6, platelets 195,000, WBC 8,500. Ferritin was 18 with an iron saturation of 19% and a TIBC of 388. Chromogranin A was 470.9. She received a vitamin B12 injection.  Thyroid ultrasound on 11/19/2019 revealed similar findings of multinodular goiter. There were no worrisome new or enlarging thyroid nodules. The previously biopsied left-sided thyroid nodules (labeled #5 and #8) were unchanged to decreased in size compared to the 03/2017 examination. Correlation with previous biopsy results was advised. Assuming a benign pathologic diagnosis, repeat sampling and/or continued dedicated follow-up was not recommended. Nodule #1 appeared spongiform/benign, previously solid, and as such has been down graded from a TR3 nodule to a TR1 nodule, and no longer met imaging criteria to recommend continued dedicated follow-up. Nodules labeled #4, #6 and #7 were unchanged compared to the 03/2017 examination though again met imaging criteria to recommend annual/biannual surveillance. Follow-up examination in 04/2022 would ensure 5 years of stability and thus a benign etiology.  Labs on 01/07/2020 revealed a hematocrit of 44.7, hemoglobin 14.2, platelets 206,000, WBC 8,800. Ferritin was 16. Folate was 29.0.  She received vitamin B12 injections (12/10/2019 - 02/04/2020).  During the interim,  ***   Past Medical History:  Diagnosis Date  . AAA (abdominal aortic aneurysm) ([place])   . Arthritis    knees, elbows  . Asthma   . Colon cancer ([place])    colon  . COPD (chronic obstructive pulmonary disease) ([place])   . GERD (gastroesophageal reflux disease)   . Hypertension   . Sleep apnea     Past Surgical History:  Procedure Laterality Date  . ABDOMINAL HYSTERECTOMY    . APPENDECTOMY    . CATARACT EXTRACTION W/PHACO Left 05/28/2019   Procedure: CATARACT EXTRACTION PHACO AND INTRAOCULAR LENS PLACEMENT (IOC) LEFT VISION BLUE 31.19 02:18.2;  Surgeon: Birder Robson, MD;  Location: [facility];  Service: Ophthalmology;  Laterality: Left;  . CATARACT EXTRACTION W/PHACO Right 06/18/2019   Procedure: CATARACT EXTRACTION PHACO AND INTRAOCULAR LENS PLACEMENT (IOC) RIGHT 34.59  02:31.7;  Surgeon: Birder Robson, MD;  Location: [facility];  Service: Ophthalmology;  Laterality: Right;  sleep apnea  . ESOPHAGOGASTRODUODENOSCOPY N/A 01/12/2019   Procedure: ESOPHAGOGASTRODUODENOSCOPY (EGD);  Surgeon: Virgel Manifold, MD;  Location: [facility] ENDOSCOPY;  Service: Endoscopy;  Laterality: N/A;  . ESOPHAGOGASTRODUODENOSCOPY (EGD) WITH PROPOFOL N/A 01/14/2019   Procedure: ESOPHAGOGASTRODUODENOSCOPY (EGD) WITH PROPOFOL;  Surgeon: [name], MD;  Location: [place];  Service: Gastroenterology;  Laterality: N/A;  . HEMOSTASIS CLIP PLACEMENT  01/14/2019   Procedure: HEMOSTASIS CLIP PLACEMENT;  Surgeon: [name], MD;  Location: [place];  Service: Gastroenterology;;  . KNEE SURGERY Right    fracture repair  . TONSILLECTOMY    . TUBAL LIGATION    . TUMOR EXCISION     colon    Family History  Problem Relation Age of Onset  . CAD Mother   . CAD Father  Social History:  reports that she quit smoking about 14 months ago. She has a 41.00 pack-year smoking history. She has never used smokeless tobacco. She reports current alcohol use. She reports that she does not use  drugs. She is a great-grandmother. She quit smoking 10.5 months ago. The patient is alone*** today.   Allergies:  Allergies  Allergen Reactions  . Aspirin     Hx of esophageal bleed    Current Medications: Current Outpatient Medications  Medication Sig Dispense Refill  . acetaminophen (TYLENOL) 500 MG tablet Take 1,000 mg by mouth every 6 (six) hours as needed for mild pain.    Marland Kitchen albuterol (VENTOLIN HFA) 108 (90 Base) MCG/ACT inhaler Inhale 1-2 puffs into the lungs every 4 (four) hours as needed for wheezing or shortness of breath.     Marland Kitchen alendronate (FOSAMAX) 70 MG tablet Take 70 mg by mouth once a week.    Marland Kitchen buPROPion (WELLBUTRIN XL) 150 MG 24 hr tablet Take 150 mg by mouth daily.     Marland Kitchen docusate sodium (COLACE) 100 MG capsule Take 100 mg by mouth daily.    Marland Kitchen ipratropium-albuterol (DUONEB) 0.5-2.5 (3) MG/3ML SOLN Take 3 mLs by nebulization every 4 (four) hours as needed. 360 mL 0  . lisinopril (ZESTRIL) 20 MG tablet Take 20 mg by mouth daily.     . montelukast (SINGULAIR) 10 MG tablet Take 10 mg by mouth at bedtime.    Marland Kitchen omeprazole (PRILOSEC) 20 MG capsule Take 20 mg by mouth 2 (two) times daily.    Marland Kitchen oxyCODONE (OXY IR/ROXICODONE) 5 MG immediate release tablet Take 5 mg by mouth 3 (three) times daily as needed for moderate pain or breakthrough pain.     . pantoprazole (PROTONIX) 40 MG tablet TAKE 1 TABLET BY MOUTH EVERY DAY 90 tablet 0  . sertraline (ZOLOFT) 100 MG tablet Take 100 mg by mouth daily.    . simvastatin (ZOCOR) 20 MG tablet Take 20 mg by mouth daily.    . TRELEGY ELLIPTA 100-62.5-25 MCG/INH AEPB Inhale 1 puff into the lungs daily.      No current facility-administered medications for this visit.    Review of Systems  Constitutional: Negative for chills, diaphoresis, fever, malaise/fatigue and weight loss (up 14 lbs).       Feels "great."  HENT: Negative.  Negative for congestion, ear discharge, ear pain, hearing loss, nosebleeds, sinus pain, sore throat and tinnitus.    Eyes: Negative.  Negative for blurred vision and double vision.  Respiratory: Negative for cough, hemoptysis, sputum production and shortness of breath.        No longer needs oxygen.  Cardiovascular: Negative.  Negative for chest pain, palpitations, orthopnea and PND.  Gastrointestinal: Negative.  Negative for abdominal pain, blood in stool, constipation, diarrhea, heartburn, melena, nausea and vomiting.  Genitourinary: Negative.  Negative for dysuria, frequency, hematuria and urgency.  Musculoskeletal: Positive for joint pain (chronic right knee pain, unchanged). Negative for back pain, myalgias and neck pain.  Skin: Negative.  Negative for rash.  Neurological: Negative.  Negative for dizziness, tingling, sensory change, focal weakness, weakness and headaches.  Endo/Heme/Allergies: Negative.  Does not bruise/bleed easily.  Psychiatric/Behavioral: Negative.  Negative for depression and memory loss. The patient is not nervous/anxious and does not have insomnia.   All other systems reviewed and are negative.  Performance status (ECOG): 1 - Symptomatic but completely ambulatory *** Vitals There were no vitals taken for this visit.   Physical Exam Vitals and nursing note reviewed.  Constitutional:      General: She is not in acute distress.    Appearance: She is well-developed. She is not diaphoretic.     Comments: Energy much improved.  HENT:     Head: Normocephalic and atraumatic.     Mouth/Throat:     Mouth: Mucous membranes are moist.     Pharynx: Oropharynx is clear. No oropharyngeal exudate.  Eyes:     General: No scleral icterus.    Extraocular Movements: Extraocular movements intact.     Conjunctiva/sclera: Conjunctivae normal.     Pupils: Pupils are equal, round, and reactive to light.     Comments: Blue eyes.  Neck:     Vascular: No JVD.  Cardiovascular:     Rate and Rhythm: Normal rate and regular rhythm.     Heart sounds: Normal heart sounds. No murmur  heard.   Pulmonary:     Effort: Pulmonary effort is normal. No respiratory distress.     Breath sounds: Normal breath sounds. No wheezing or rales.  Chest:  Breasts:     Right: No axillary adenopathy or supraclavicular adenopathy.     Left: No axillary adenopathy or supraclavicular adenopathy.    Abdominal:     General: Bowel sounds are normal. There is no distension.     Palpations: Abdomen is soft. There is no mass.     Tenderness: There is no abdominal tenderness. There is no guarding or rebound.  Musculoskeletal:        General: No swelling or tenderness. Normal range of motion.     Cervical back: Normal range of motion and neck supple.  Lymphadenopathy:     Head:     Right side of head: No preauricular, posterior auricular or occipital adenopathy.     Left side of head: No preauricular, posterior auricular or occipital adenopathy.     Cervical: No cervical adenopathy.     Upper Body:     Right upper body: No supraclavicular or axillary adenopathy.     Left upper body: No supraclavicular or axillary adenopathy.     Lower Body: No right inguinal adenopathy. No left inguinal adenopathy.  Skin:    General: Skin is warm and dry.     Coloration: Skin is not pale.  Neurological:     Mental Status: She is alert and oriented to person, place, and time.  Psychiatric:        Behavior: Behavior normal.        Thought Content: Thought content normal.        Judgment: Judgment normal.     No visits with results within 3 Day(s) from this visit.  Latest known visit with results is:  Appointment on 01/07/2020  Component Date Value Ref Range Status  . Folate 01/07/2020 29.0  >5.9 ng/mL Final   Performed at [facility], [address] [place], [place] [address]  . Ferritin 01/07/2020 16  11 - 307 ng/mL Final   Performed at [facility], [address] [place], [place] [address]  . WBC 01/07/2020 8.8  4.0 - 10.5 K/uL Final  . RBC 01/07/2020 4.97  3.87 - 5.11  MIL/uL Final  . Hemoglobin 01/07/2020 14.2  12.0 - 15.0 g/dL Final  . HCT 01/07/2020 44.7  36.0 - 46.0 % Final  . MCV 01/07/2020 89.9  80.0 - 100.0 fL Final  . MCH 01/07/2020 28.6  26.0 - 34.0 pg Final  . MCHC 01/07/2020 31.8  30.0 - 36.0 g/dL Final  . RDW  01/07/2020 14.5  11.5 - 15.5 % Final  . Platelets 01/07/2020 206  150 - 400 K/uL Final  . nRBC 01/07/2020 0.0  0.0 - 0.2 % Final   Performed at [facility], [address] [place], [place] [address]    Assessment:  Alicia Holder is a 70 y.o. 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 She has a 60+ pack year smoking history. She denies sleep apnea.   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 Last colonoscopywas 10/2012 [phone] per patient). She has lost 120 pounds in 3 years with portion control and food choices.  Work-upon 01/04/2017 revealed a hematocrit of 50.8, hemoglobin 17, MCV 94.7, platelets 173,000, white count 7400 with an ANC of 5100. Differential was normal. Normal labs included CMP, epo level (10.7), ferritin (27), with an iron saturation (6%), JAK2 V617F and exons 12-15 were negative. BCR-ABL testing was negative for e1a2 (p190), e13a2 (b2a2, p210) and e14a2 (b3a2, p210) fusion transcripts. Carbon monoxide levelwas 17% (<9% smokers).   Low dose chest CT on 03/16/2017 was lung-RADS 2, benign appearance or behavior. There was left main and two-vessel coronary atherosclerosis. There was a 4.2 cm ascending thoracic aortic aneurysmwith recommendation for annual imaging followup by CTA or MRA. 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 There was a multinodular goiter. Suspected dominant 4.5 cm inferior left thyroid lobe nodule. There was a small to moderate hiatal hernia. Low dose chest CTon 03/20/2018 revealed benign appearance or behavior. Lung-RADS score was 2.  Low dose chest CT on 04/19/2019 revealed a new 8 mm left upper lobe nodule and a 7 mm right upper lobe  nodule.  Low dose chest CT on 07/18/2019 revealed Lung-RADS 2S, benign appearance or behavior. There was aortic atherosclerosis, in addition to 2 vessel coronary artery disease. There was mild diffuse bronchial wall thickening with mild centrilobular and paraseptal emphysema; imaging findings suggestive of underlying COPD. There were calcifications of the aortic valve and mitral annulus.  Thyroid ultrasound on 04/03/2017 revealed 7 nodules. Left mid thyroid nodule (labeled 4) met criteria for biopsy. The left inferior thyroid nodule (labeled 7) would meet criteria for biopsy, however, was favored to be a pseudo nodule and extension of thyroid goiter into the mediastinum given the appearance on prior CT. Left thyroid nodules (labeled 5 and 6) both met criteria for surveillance. None of the right-sided nodules met criteria for surveillance or biopsy.Thyroid ultrasoundon 09/10/2018 revealed thyromegaly with bilateral nodules, stable. None metcriteria for biopsy. Thyroid ultrasound on 11/19/2019 revealed similar findings of multinodular goiter. There were no worrisome new or enlarging thyroid nodules.  FNA thyroid biopsy x 2 on 04/20/2017 of the left mid thyroid nodule and left inferior thyroid nodule were benign (Bethesda category II) and c/w benign follicular nodule.   She began a phlebotomy programon 02/08/2017 (last 10/12/2018). She undergoes small volume phlebotomy (300 cc)if her hematocrit is >47.  She was admitted to [facility] from 01/11/2019 - 01/13/2019 then transferred to [facility] from 01/13/2019 - 01/18/2019 for GI bleeding.  Hemoglobin was 5.9.  EGD on 01/12/2019 revealed red blood in the distal esophagus and large blood clots in the gastric fundus. Hemospray was used to treat the distal esophagus and gastric fundus.  She received 6 units of PRBCs.  She received a Protonix drip. She was transferred to [facility] for vascular intervention.  EGD on 01/14/2019 revealed severe (LA grade D)  esophagitis with bleeding.  There were esophageal ulcers with active bleeding.  Hemostatic clip was placed.There was erythematous  mucosa in the cardia, gastric fundus, gastric body, incisura, antrum, and prepyloric region of the stomach and pylorus.  She received 1 unit of PRBCs on 01/17/2019.  She has iron deficiency anemia s/p massive GI bleeding.  Labs on 01/11/2019 revealed a ferritin 26 with an iron saturation 12% with a TIBC 247.  B12 was 173 (low) and folate 10.3 (normal).  Hemolysis evaluation (Coombs, haptoglobin, LDH) was normal.  Retic was 4.8%.  She received weekly Venofer x 3 (02/18/2019 - 03/04/2019).   Ferritin has been monitored: 11 on 02/14/2019, 12 on 05/09/2019, 27 on 07/05/2019, 18 on 09/04/2019, and 18 on 11/11/2019.  She has B12 deficiency.  B12 was 173 on 01/11/2019.  She receives monthly B12 injections (began 01/14/2019; last 10/02/2019).  Folate was 29 on 01/17/2020.  She has a history of carcinoid tumor.  Chromogranin was 926.7 (0-101.8) on 02/13/2019 and 330.8 on 05/09/2019.   24 hour urine for 5HIAA was 2.4 mg/24 hours (0-14.9) on 03/17/2019.  She received the Moderna COVID-19 vaccine on 06/24/2019 and 07/25/2019. She received the flu shot at the beginning of 11/2019.  Symptomatically, ***  Plan: 1.   Labs today: CBC with diff, ferritin, iron studies, folate, CA125.   2.   Secondary erythrocytosis, resolved  She stopped smoking 10.5 months ago.  Hematocrit 37.2  Hemoglobin 11.0.  MCV 90.1 on 03/14/2019.  Hematocrit 44.5  Hemoglobin 14.0.  MCV 90.6 on 07/05/2019.  Hematocrit 42.8. Hemoglobin 13.6.  MCV 93.0 on 11/11/2019.  Continue to monitor. 3.  History of GI bleed             EGD on 01/14/2019 revealed severe esophagitis with ulcers and active bleeding.             She is on Protonix.  Encourage follow-up with GI.    Continue to monitor. 4.   Iron deficiency             Hematocrit 42.8 Hemoglobin 13.6.  MCV 93.0 on 02/11/2019.             Ferritin 18  with an iron saturation of 19% and a TIBC of 388.             No Alroy Bailiff for today with plans for close monitoring. 5.   B12 deficiency             B12 today and monthly x6.  Monitor folate annually. 6.  History of carcinoid tumor             Chromogranin was 926.7 (high) on 02/13/2019.  Unclear if elevation due to recurrent carcinoid, chronic atrophic gastritis or PPI.  24 hour urine for 5HIAA was 2.4 mg/24 hours (0-14.9).  No plan for octreotide scan or DOTATE PET scan.  Continue to monitor. 7.   Thyroid nodules and low TSH  TSH was 1.182 and free T4 was 0.97 on 07/05/2019.  Thyroid ultrasoundon 09/10/2018 revealed stable bilateral thyroid nodules.  Thyroid ultrasound was planned for 08/2019.  Schedule thyroid ultrasound. 8.   Left ovarian cyst  Patient denies any abdominal symptoms.  CA125 was 4.3 on 07/05/2019.  Transabdominal and transvaginal ultrasound of pelvis on 04/22/2019 revealed a peripheral 1.7 x 1.0 x 1.0 cm cystic lesion in the left ovary with a single internal septation.   Transvaginal pelvic ultrasound on 09/27/2019 revealed a small simple cyst on the left ovary. The right ovary was not visible.  Discuss plan for follow-up in 1 year. 9.   Pulmonary nodules  Low dose chest CT  on 04/19/2019 revealed a new 8 mm left upper lobe nodule and a 7 mm right upper lobe nodule   Low dose chest CT on 07/18/2019 revealed mild diffuse bronchial wall thickening with mild centrilobular and paraseptal emphysema.     Images personally reviewed.  Agree with radiology findings.  Discuss plan for follow-up low dose chest CT in on 07/17/2020. 10.Weight loss, resolved Weight up 14 pounds since 07/08/2019. Patient eating better. 11.   No Venofer today. 12.   B12 today and monthly x 6. 13.   Please reschedule thyroid ultrasound. 14.   RTC in 2 months for labs (CBC, ferritin, folate). 15.   RTC in 4 months for MD assessment, labs (CBC with diff, ferritin, iron  studies, folate, CA125), and review of thyroid ultrasound.  I discussed the assessment and treatment plan with the patient.  The patient was provided an opportunity to ask questions and all were answered.  The patient agreed with the plan and demonstrated an understanding of the instructions.  The patient was advised to call back if the symptoms worsen or if the condition fails to improve as anticipated.  I provided *** minutes of face-to-face time during this this encounter and > 50% was spent counseling as documented under my assessment and plan.  Lequita Asal, MD, PhD    03/02/2020, 1:58 PM  I, Mirian Mo Tufford, am acting as a Education administrator for [employer]. Mike Gip, MD.   I, Melissa C. Mike Gip, MD, have reviewed the above documentation for accuracy and completeness, and I agree with the above.

## 2020-03-03 ENCOUNTER — Inpatient Hospital Stay: Payer: Medicare HMO

## 2020-03-03 ENCOUNTER — Ambulatory Visit: Payer: Medicare HMO | Admitting: Hematology and Oncology

## 2020-03-03 ENCOUNTER — Other Ambulatory Visit: Payer: Medicare HMO

## 2020-03-03 ENCOUNTER — Telehealth: Payer: Self-pay | Admitting: Hematology and Oncology

## 2020-03-03 DIAGNOSIS — R918 Other nonspecific abnormal finding of lung field: Secondary | ICD-10-CM | POA: Insufficient documentation

## 2020-03-03 NOTE — Telephone Encounter (Signed)
Returned call to pt. Left message to confirm today's appt 03/03/20.

## 2020-03-03 NOTE — Telephone Encounter (Signed)
Pt does not have transportation for today's appointment and needs to r/s. Injection appt made for 2/2 and labs +MD f/u made for 2/22. Pt needs afternoon appts if possible.

## 2020-03-04 ENCOUNTER — Other Ambulatory Visit: Payer: Self-pay

## 2020-03-04 ENCOUNTER — Inpatient Hospital Stay: Payer: Medicare HMO | Attending: Hematology and Oncology

## 2020-03-04 DIAGNOSIS — J449 Chronic obstructive pulmonary disease, unspecified: Secondary | ICD-10-CM | POA: Insufficient documentation

## 2020-03-04 DIAGNOSIS — Z85030 Personal history of malignant carcinoid tumor of large intestine: Secondary | ICD-10-CM | POA: Insufficient documentation

## 2020-03-04 DIAGNOSIS — N83202 Unspecified ovarian cyst, left side: Secondary | ICD-10-CM | POA: Diagnosis not present

## 2020-03-04 DIAGNOSIS — I10 Essential (primary) hypertension: Secondary | ICD-10-CM | POA: Diagnosis not present

## 2020-03-04 DIAGNOSIS — Z79899 Other long term (current) drug therapy: Secondary | ICD-10-CM | POA: Diagnosis not present

## 2020-03-04 DIAGNOSIS — D508 Other iron deficiency anemias: Secondary | ICD-10-CM | POA: Insufficient documentation

## 2020-03-04 DIAGNOSIS — E538 Deficiency of other specified B group vitamins: Secondary | ICD-10-CM | POA: Diagnosis not present

## 2020-03-04 DIAGNOSIS — R918 Other nonspecific abnormal finding of lung field: Secondary | ICD-10-CM | POA: Diagnosis not present

## 2020-03-04 DIAGNOSIS — Z87891 Personal history of nicotine dependence: Secondary | ICD-10-CM | POA: Diagnosis not present

## 2020-03-04 DIAGNOSIS — D751 Secondary polycythemia: Secondary | ICD-10-CM | POA: Insufficient documentation

## 2020-03-04 DIAGNOSIS — R978 Other abnormal tumor markers: Secondary | ICD-10-CM | POA: Insufficient documentation

## 2020-03-04 DIAGNOSIS — G4730 Sleep apnea, unspecified: Secondary | ICD-10-CM | POA: Diagnosis not present

## 2020-03-04 MED ORDER — CYANOCOBALAMIN 1000 MCG/ML IJ SOLN
1000.0000 ug | Freq: Once | INTRAMUSCULAR | Status: AC
  Administered 2020-03-04: 1000 ug via INTRAMUSCULAR
  Filled 2020-03-04: qty 1

## 2020-03-23 NOTE — Progress Notes (Signed)
[facility]  [address] [place], [place] [address] Phone: [phone]  Fax: [phone]   Clinic Day:  03/24/2020  Referring physician: Ellamae Sia, MD  Chief Complaint: ABCDE Holder is a 70 y.o. female with h/o carcinoid tumor, secondary polycythemia, iron deficiency s/p GI bleed and B12 deficiency who is seen for 4 month assessment.  HPI:  The patient was last seen in the hematology clinic on 11/12/2019. At that time, she felt "great."  Her breathing had improved and she had been taken off of oxygen.  She ate red meat once a week.  Exam was stable. Hematocrit was 42.8, hemoglobin 13.6, platelets 195,000, WBC 8,500. Ferritin was 18 with an iron saturation of 19% and a TIBC of 388. Chromogranin A was 470.9. She received a vitamin B12 injection.  Thyroid ultrasound on 11/19/2019 revealed similar findings of multinodular goiter. There were no worrisome new or enlarging thyroid nodules. The previously biopsied left-sided thyroid nodules (labeled #5 and #8) were unchanged to decreased in size compared to the 03/2017 examination. Correlation with previous biopsy results was advised. Assuming a benign pathologic diagnosis, repeat sampling and/or continued dedicated follow-up was not recommended. Nodule #1 now appeared spongiform/benign, previously solid, and as such was been down graded from a TR3 nodule to a TR1 nodule, and no longer met imaging criteria to recommend continued dedicated follow-up. Nodules labeled #4, #6 and #7 were unchanged compared to the 03/2017 examination though again met imaging criteria to recommend annual/biannual surveillance. Follow-up examination in 04/2022 would ensure 5 years of stability and thus a benign etiology.  Labs on 01/07/2020 revealed a hematocrit of 44.7, hemoglobin 14.2, platelets 206,000, WBC 8,800. Ferritin was 16. Folate was 29.0.  She received vitamin B12 injections monthly x 4 (12/10/2019 -  03/04/2020).  During the interim, she has been "good." She has constant knee pain. She is still off of oxygen and is breathing well. She denies shortness of breath. She has not smoked a cigarette in 15 months.  The patients a lot of broccoli. She recently starting incorporating kale and other vegetables in her diet. She has not been outside walking due to the weather.   Past Medical History:  Diagnosis Date  . AAA (abdominal aortic aneurysm) ([place])   . Arthritis    knees, elbows  . Asthma   . Colon cancer ([place])    colon  . COPD (chronic obstructive pulmonary disease) ([place])   . GERD (gastroesophageal reflux disease)   . Hypertension   . Sleep apnea     Past Surgical History:  Procedure Laterality Date  . ABDOMINAL HYSTERECTOMY    . APPENDECTOMY    . CATARACT EXTRACTION W/PHACO Left 05/28/2019   Procedure: CATARACT EXTRACTION PHACO AND INTRAOCULAR LENS PLACEMENT (IOC) LEFT VISION BLUE 31.19 02:18.2;  Surgeon: Birder Robson, MD;  Location: [place];  Service: Ophthalmology;  Laterality: Left;  . CATARACT EXTRACTION W/PHACO Right 06/18/2019   Procedure: CATARACT EXTRACTION PHACO AND INTRAOCULAR LENS PLACEMENT (IOC) RIGHT 34.59  02:31.7;  Surgeon: Birder Robson, MD;  Location: [place];  Service: Ophthalmology;  Laterality: Right;  sleep apnea  . ESOPHAGOGASTRODUODENOSCOPY N/A 01/12/2019   Procedure: ESOPHAGOGASTRODUODENOSCOPY (EGD);  Surgeon: Virgel Manifold, MD;  Location: [facility] ENDOSCOPY;  Service: Endoscopy;  Laterality: N/A;  . ESOPHAGOGASTRODUODENOSCOPY (EGD) WITH PROPOFOL N/A 01/14/2019   Procedure: ESOPHAGOGASTRODUODENOSCOPY (EGD) WITH PROPOFOL;  Surgeon: [name], MD;  Location: [place];  Service: Gastroenterology;  Laterality: N/A;  . HEMOSTASIS CLIP PLACEMENT  01/14/2019  Procedure: HEMOSTASIS CLIP PLACEMENT;  Surgeon: [name], MD;  Location: [place];  Service: Gastroenterology;;  . KNEE SURGERY Right    fracture repair  .  TONSILLECTOMY    . TUBAL LIGATION    . TUMOR EXCISION     colon    Family History  Problem Relation Age of Onset  . CAD Mother   . CAD Father     Social History:  reports that she quit smoking about 14 months ago. She has a 41.00 pack-year smoking history. She has never used smokeless tobacco. She reports current alcohol use. She reports that she does not use drugs. She is a great-grandmother. She quit smoking 15 months ago. The patient is alone today.   Allergies:  Allergies  Allergen Reactions  . Aspirin     Hx of esophageal bleed    Current Medications: Current Outpatient Medications  Medication Sig Dispense Refill  . acetaminophen (TYLENOL) 500 MG tablet Take 1,000 mg by mouth every 6 (six) hours as needed for mild pain.    Marland Kitchen albuterol (VENTOLIN HFA) 108 (90 Base) MCG/ACT inhaler Inhale 1-2 puffs into the lungs every 4 (four) hours as needed for wheezing or shortness of breath.     Marland Kitchen alendronate (FOSAMAX) 70 MG tablet Take 70 mg by mouth once a week.    Marland Kitchen buPROPion (WELLBUTRIN XL) 150 MG 24 hr tablet Take 150 mg by mouth daily.     Marland Kitchen docusate sodium (COLACE) 100 MG capsule Take 100 mg by mouth daily.    Marland Kitchen ipratropium-albuterol (DUONEB) 0.5-2.5 (3) MG/3ML SOLN Take 3 mLs by nebulization every 4 (four) hours as needed. 360 mL 0  . lisinopril (ZESTRIL) 20 MG tablet Take 20 mg by mouth daily.     . montelukast (SINGULAIR) 10 MG tablet Take 10 mg by mouth at bedtime.    Marland Kitchen omeprazole (PRILOSEC) 20 MG capsule Take 20 mg by mouth 2 (two) times daily.    Marland Kitchen oxyCODONE (OXY IR/ROXICODONE) 5 MG immediate release tablet Take 5 mg by mouth 3 (three) times daily as needed for moderate pain or breakthrough pain.     . pantoprazole (PROTONIX) 40 MG tablet TAKE 1 TABLET BY MOUTH EVERY DAY 90 tablet 0  . sertraline (ZOLOFT) 100 MG tablet Take 100 mg by mouth daily.    . simvastatin (ZOCOR) 20 MG tablet Take 20 mg by mouth daily.    . TRELEGY ELLIPTA 100-62.5-25 MCG/INH AEPB Inhale 1 puff into  the lungs daily.      No current facility-administered medications for this visit.    Review of Systems  Constitutional: Negative for chills, diaphoresis, fever, malaise/fatigue and weight loss (up 6 lbs).       Feels "good."  HENT: Negative.  Negative for congestion, ear discharge, ear pain, hearing loss, nosebleeds, sinus pain, sore throat and tinnitus.   Eyes: Negative.  Negative for blurred vision and double vision.  Respiratory: Negative.  Negative for cough, hemoptysis, sputum production and shortness of breath.        No longer needs oxygen.  Cardiovascular: Negative.  Negative for chest pain, palpitations and leg swelling.  Gastrointestinal: Negative.  Negative for abdominal pain, blood in stool, constipation, diarrhea, heartburn, melena, nausea and vomiting.  Genitourinary: Negative.  Negative for dysuria, frequency, hematuria and urgency.  Musculoskeletal: Positive for joint pain (chronic right knee pain, unchanged). Negative for back pain, myalgias and neck pain.  Skin: Negative.  Negative for itching and rash.  Neurological: Negative.  Negative for dizziness, tingling,  sensory change, focal weakness, weakness and headaches.  Endo/Heme/Allergies: Negative.  Does not bruise/bleed easily.  Psychiatric/Behavioral: Negative.  Negative for depression and memory loss. The patient is not nervous/anxious and does not have insomnia.   All other systems reviewed and are negative.  Performance status (ECOG): 1 - Symptomatic but completely ambulatory   Vitals Blood pressure (!) 156/99, pulse 78, temperature (!) 96.4 F (35.8 C), temperature source Tympanic, weight 179 lb 12.6 oz (81.5 kg), SpO2 99 %.   Physical Exam Vitals and nursing note reviewed.  Constitutional:      General: She is not in acute distress.    Appearance: She is well-developed. She is not diaphoretic.  HENT:     Head: Normocephalic and atraumatic.     Mouth/Throat:     Mouth: Mucous membranes are moist.      Pharynx: Oropharynx is clear. No oropharyngeal exudate.  Eyes:     General: No scleral icterus.    Extraocular Movements: Extraocular movements intact.     Conjunctiva/sclera: Conjunctivae normal.     Pupils: Pupils are equal, round, and reactive to light.     Comments: Blue eyes.  Neck:     Vascular: No JVD.  Cardiovascular:     Rate and Rhythm: Normal rate and regular rhythm.     Heart sounds: Normal heart sounds. No murmur heard.   Pulmonary:     Effort: Pulmonary effort is normal. No respiratory distress.     Breath sounds: Normal breath sounds. No wheezing or rales.  Chest:  Breasts:     Right: No axillary adenopathy or supraclavicular adenopathy.     Left: No axillary adenopathy or supraclavicular adenopathy.    Abdominal:     General: Bowel sounds are normal. There is no distension.     Palpations: Abdomen is soft. There is no mass.     Tenderness: There is no abdominal tenderness. There is no guarding or rebound.  Musculoskeletal:        General: No swelling or tenderness. Normal range of motion.     Cervical back: Normal range of motion and neck supple.  Lymphadenopathy:     Head:     Right side of head: No preauricular, posterior auricular or occipital adenopathy.     Left side of head: No preauricular, posterior auricular or occipital adenopathy.     Cervical: No cervical adenopathy.     Upper Body:     Right upper body: No supraclavicular or axillary adenopathy.     Left upper body: No supraclavicular or axillary adenopathy.     Lower Body: No right inguinal adenopathy. No left inguinal adenopathy.  Skin:    General: Skin is warm and dry.     Coloration: Skin is not pale.  Neurological:     Mental Status: She is alert and oriented to person, place, and time.  Psychiatric:        Behavior: Behavior normal.        Thought Content: Thought content normal.        Judgment: Judgment normal.     Appointment on 03/24/2020  Component Date Value Ref Range Status   . Sodium 03/24/2020 138  135 - 145 mmol/L Final  . Potassium 03/24/2020 4.1  3.5 - 5.1 mmol/L Final  . Chloride 03/24/2020 103  98 - 111 mmol/L Final  . CO2 03/24/2020 26  22 - 32 mmol/L Final  . Glucose, Bld 03/24/2020 87  70 - 99 mg/dL Final   Glucose reference range applies  only to samples taken after fasting for at least 8 hours.  . BUN 03/24/2020 16  8 - 23 mg/dL Final  . Creatinine, Ser 03/24/2020 0.77  0.44 - 1.00 mg/dL Final  . Calcium 03/24/2020 9.4  8.9 - 10.3 mg/dL Final  . Total Protein 03/24/2020 7.7  6.5 - 8.1 g/dL Final  . Albumin 03/24/2020 3.9  3.5 - 5.0 g/dL Final  . AST 03/24/2020 18  15 - 41 U/L Final  . ALT 03/24/2020 13  0 - 44 U/L Final  . Alkaline Phosphatase 03/24/2020 38  38 - 126 U/L Final  . Total Bilirubin 03/24/2020 0.5  0.3 - 1.2 mg/dL Final  . GFR, Estimated 03/24/2020 >60  >60 mL/min Final   Comment: (NOTE) Calculated using the CKD-EPI Creatinine Equation (2021)   . Anion gap 03/24/2020 9  5 - 15 Final   Performed at [facility], [address] [place], [place] [address]  . WBC 03/24/2020 5.8  4.0 - 10.5 K/uL Final  . RBC 03/24/2020 5.13* 3.87 - 5.11 MIL/uL Final  . Hemoglobin 03/24/2020 14.5  12.0 - 15.0 g/dL Final  . HCT 03/24/2020 45.7  36.0 - 46.0 % Final  . MCV 03/24/2020 89.1  80.0 - 100.0 fL Final  . MCH 03/24/2020 28.3  26.0 - 34.0 pg Final  . MCHC 03/24/2020 31.7  30.0 - 36.0 g/dL Final  . RDW 03/24/2020 14.8  11.5 - 15.5 % Final  . Platelets 03/24/2020 208  150 - 400 K/uL Final  . nRBC 03/24/2020 0.0  0.0 - 0.2 % Final  . Neutrophils Relative % 03/24/2020 59  % Final  . Neutro Abs 03/24/2020 3.5  1.7 - 7.7 K/uL Final  . Lymphocytes Relative 03/24/2020 30  % Final  . Lymphs Abs 03/24/2020 1.7  0.7 - 4.0 K/uL Final  . Monocytes Relative 03/24/2020 8  % Final  . Monocytes Absolute 03/24/2020 0.4  0.1 - 1.0 K/uL Final  . Eosinophils Relative 03/24/2020 2  % Final  . Eosinophils Absolute 03/24/2020 0.1  0.0 - 0.5 K/uL Final   . Basophils Relative 03/24/2020 1  % Final  . Basophils Absolute 03/24/2020 0.1  0.0 - 0.1 K/uL Final  . Immature Granulocytes 03/24/2020 0  % Final  . 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 She has a 60+ pack year smoking history. She denies sleep apnea.   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 Last colonoscopywas 10/2012 [phone] per patient). She has lost 120 pounds in 3 years with portion control and food choices.  Work-upon 01/04/2017 revealed a hematocrit of 50.8, hemoglobin 17, MCV 94.7, platelets 173,000, white count 7400 with an ANC of 5100. Differential was normal. Normal labs included CMP, epo level (10.7), ferritin (27), with an iron saturation (6%), JAK2 V617F and exons 12-15 were negative. BCR-ABL testing was negative for e1a2 (p190), e13a2 (b2a2, p210) and e14a2 (b3a2, p210) fusion transcripts. Carbon monoxide levelwas 17% (<9% smokers).   Low dose chest CT on 03/16/2017 was lung-RADS 2, benign appearance or behavior. There was left main and two-vessel coronary atherosclerosis. There was a 4.2 cm ascending thoracic aortic aneurysmwith recommendation for annual imaging followup by CTA or MRA. 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 There was a multinodular goiter. Suspected dominant 4.5  cm inferior left thyroid lobe nodule. There was a small to moderate hiatal hernia. Low dose chest CTon 03/20/2018 revealed benign appearance or behavior. Lung-RADS score was 2.  Low dose chest CT on 04/19/2019 revealed a new 8 mm left upper lobe nodule and a 7 mm right upper lobe nodule.  Low dose chest CT on 07/18/2019 revealed Lung-RADS 2S, benign appearance or behavior. There was aortic atherosclerosis, in addition to 2 vessel coronary artery  disease. There was mild diffuse bronchial wall thickening with mild centrilobular and paraseptal emphysema; imaging findings suggestive of underlying COPD. There were calcifications of the aortic valve and mitral annulus.  Thyroid ultrasound on 04/03/2017 revealed 7 nodules. Left mid thyroid nodule (labeled 4) met criteria for biopsy. The left inferior thyroid nodule (labeled 7) would meet criteria for biopsy, however, was favored to be a pseudo nodule and extension of thyroid goiter into the mediastinum given the appearance on prior CT. Left thyroid nodules (labeled 5 and 6) both met criteria for surveillance. None of the right-sided nodules met criteria for surveillance or biopsy.Thyroid ultrasoundon 09/10/2018 revealed thyromegaly with bilateral nodules, stable. None metcriteria for biopsy. Recommended annual/biennial ultrasound follow-up   FNA thyroid biopsy x 2 on 04/20/2017 of the left mid thyroid nodule and left inferior thyroid nodule were benign (Bethesda category II) and c/w benign follicular nodule.   She began a phlebotomy programon 02/08/2017 (last 10/12/2018). She undergoes small volume phlebotomy (300 cc)if her hematocrit is >47.  She was admitted to [facility] from 01/11/2019 - 01/13/2019 then transferred to [facility] from 01/13/2019 - 01/18/2019 for GI bleeding.  Hemoglobin was 5.9.  EGD on 01/12/2019 revealed red blood in the distal esophagus and large blood clots in the gastric fundus. Hemospray was used to treat the distal esophagus and gastric fundus.  She received 6 units of PRBCs.  She received a Protonix drip. She was transferred to [facility] for vascular intervention.  EGD on 01/14/2019 revealed severe (LA grade D) esophagitis with bleeding.  There were esophageal ulcers with active bleeding.  Hemostatic clip was placed.There was erythematous mucosa in the cardia, gastric fundus, gastric body, incisura, antrum, and prepyloric region of the stomach and pylorus.  She  received 1 unit of PRBCs on 01/17/2019.  She has iron deficiency anemia s/p massive GI bleeding.  Labs on 01/11/2019 revealed a ferritin 26 with an iron saturation 12% with a TIBC 247.  B12 was 173 (low) and folate 10.3 (normal).  Hemolysis evaluation (Coombs, haptoglobin, LDH) was normal.  Retic was 4.8%.  She received weekly Venofer x 3 (02/18/2019 - 03/04/2019).   Ferritin has been monitored: 11 on 02/14/2019, 12 on 05/09/2019, 27 on 07/05/2019, 18 on 09/04/2019, and 18 on 11/11/2019.  She has B12 deficiency.  B12 was 173 on 01/11/2019.  She receives monthly B12 injections (began 01/14/2019; last 03/04/2020).  Folate was 22 on 02/14/2019.  She has a history of carcinoid tumor.  Chromogranin has been followed (0-101.8):  926.7 on 02/13/2019, 330.8 on 05/09/2019, and 470.9 on 11/11/2019.   24 hour urine for 5HIAA was 2.4 mg/24 hours (0-14.9) on 03/17/2019.  She received the Moderna COVID-19 vaccine on 06/24/2019 and 07/25/2019. She received the flu shot at the beginning of 11/2019.  Symptomatically, she feels "good."  She remains off of oxygen. She denies shortness of breath. She has not smoked a cigarette in 15 months.  Plan: 1.   Labs today: CBC with diff, CMP, ferritin, iron studies, folate, chromogranin, CA125 2.   Secondary erythrocytosis, resolved  She  stopped smoking 15 months ago.  Hematocrit 45.7 Hemoglobin 14.5.  MCV 89.1 on 03/24/2020.  Continue to monitor. 3.  History of GI bleed             EGD on 01/14/2019 revealed severe esophagitis with ulcers and active bleeding.             She is on Protonix.  Follow-up with GI.   4.   Iron deficiency             Hematocrit 45.7 Hemoglobin 14.5.  MCV 89.1 on 03/24/2020.             Ferritin 23 with an iron saturation of 17 % and a TIBC of 400.             Continue to monitor. 5.   B12 deficiency             Patient receives B12 monthly (last 03/04/2020).  Check folate annually. 6.  History of carcinoid tumor              Chromogranin was 926.7 (high) on 02/13/2019.  Unclear if elevation due to recurrent carcinoid, chronic atrophic gastritis or PPI.  24 hour urine for 5HIAA was 2.4 mg/24 hours (0-14.9).  No plan for octreotide scan or DOTATE PET scan.  Continue to monitor. 7.   Thyroid nodules and low TSH  TSH was 1.182 and free T4 was 0.97 on 07/05/2019.  Thyroid ultrasoundon 09/10/2018 revealed stable bilateral thyroid nodules.  Thyroid ultrasound on 11/19/2019 was reviewed.   Discuss plans for surveillance  Follow-up thyroid ultrasound in 04/2022 8.   Left ovarian cyst  Patient is asymptomatic  CA125 is 19.1 (normal) today.  Transabdominal and transvaginal ultrasound of pelvis on 04/22/2019 revealed a peripheral 1.7 x 1.0 x 1.0 cm cystic lesion in the left ovary with a single internal septation.   Transvaginal pelvic ultrasound on 09/27/2019 revealed a small simple cyst on the left ovary. The right ovary was not visible.  Continue surveillance. 9.   Pulmonary nodules  Low dose chest CT on 04/19/2019 revealed a new 8 mm left upper lobe nodule and a 7 mm right upper lobe nodule   Low dose chest CT on 07/18/2019 revealed mild diffuse bronchial wall thickening with mild centrilobular and paraseptal emphysema.    Continue low-dose chest CT on 07/17/2020. 10.   Pelvis ultrasound 09/25/2020. 11.   RTC in 6 months for MD assessment, labs (CBC with diff, ferritin, iron studies, CA125), and review of low dose chest CT and pelvic ultrasound.  I discussed the assessment and treatment plan with the patient.  The patient was provided an opportunity to ask questions and all were answered.  The patient agreed with the plan and demonstrated an understanding of the instructions.  The patient was advised to call back if the symptoms worsen or if the condition fails to improve as anticipated.  I provided 17 minutes of face-to-face time during this this encounter and > 50% was spent counseling as documented under my  assessment and plan.  An additional 10 minutes were spent reviewing her chart (Epic and Care Everywhere) including notes, labs, and imaging studies.    Lequita Asal, MD, PhD    03/24/2020, 3:42 PM  I, Mirian Mo Tufford, am acting as a Education administrator for [employer]. Mike Gip, MD.   I, Imelda Dandridge C. Mike Gip, MD, have reviewed the above documentation for accuracy and completeness, and I agree with the above.

## 2020-03-24 ENCOUNTER — Inpatient Hospital Stay: Payer: Medicare HMO

## 2020-03-24 ENCOUNTER — Other Ambulatory Visit: Payer: Self-pay

## 2020-03-24 ENCOUNTER — Encounter: Payer: Self-pay | Admitting: Hematology and Oncology

## 2020-03-24 ENCOUNTER — Inpatient Hospital Stay (HOSPITAL_BASED_OUTPATIENT_CLINIC_OR_DEPARTMENT_OTHER): Payer: Medicare HMO | Admitting: Hematology and Oncology

## 2020-03-24 ENCOUNTER — Other Ambulatory Visit: Payer: Self-pay | Admitting: Hematology and Oncology

## 2020-03-24 VITALS — BP 156/99 | HR 78 | Temp 96.4°F | Wt 179.8 lb

## 2020-03-24 DIAGNOSIS — E041 Nontoxic single thyroid nodule: Secondary | ICD-10-CM

## 2020-03-24 DIAGNOSIS — N83202 Unspecified ovarian cyst, left side: Secondary | ICD-10-CM

## 2020-03-24 DIAGNOSIS — D500 Iron deficiency anemia secondary to blood loss (chronic): Secondary | ICD-10-CM | POA: Diagnosis not present

## 2020-03-24 DIAGNOSIS — Z85110 Personal history of malignant carcinoid tumor of bronchus and lung: Secondary | ICD-10-CM

## 2020-03-24 DIAGNOSIS — E538 Deficiency of other specified B group vitamins: Secondary | ICD-10-CM | POA: Diagnosis not present

## 2020-03-24 DIAGNOSIS — D751 Secondary polycythemia: Secondary | ICD-10-CM

## 2020-03-24 LAB — COMPREHENSIVE METABOLIC PANEL
ALT: 13 U/L (ref 0–44)
AST: 18 U/L (ref 15–41)
Albumin: 3.9 g/dL (ref 3.5–5.0)
Alkaline Phosphatase: 38 U/L (ref 38–126)
Anion gap: 9 (ref 5–15)
BUN: 16 mg/dL (ref 8–23)
CO2: 26 mmol/L (ref 22–32)
Calcium: 9.4 mg/dL (ref 8.9–10.3)
Chloride: 103 mmol/L (ref 98–111)
Creatinine, Ser: 0.77 mg/dL (ref 0.44–1.00)
GFR, Estimated: >60 mL/min (ref >60)
Glucose, Bld: 87 mg/dL (ref 70–99)
Potassium: 4.1 mmol/L (ref 3.5–5.1)
Sodium: 138 mmol/L (ref 135–145)
Total Bilirubin: 0.5 mg/dL (ref 0.3–1.2)
Total Protein: 7.7 g/dL (ref 6.5–8.1)

## 2020-03-24 LAB — CBC WITH DIFFERENTIAL/PLATELET
Abs Immature Granulocytes: 0.02 10*3/uL (ref 0.00–0.07)
Basophils Absolute: 0.1 10*3/uL (ref 0.0–0.1)
Basophils Relative: 1 %
Eosinophils Absolute: 0.1 10*3/uL (ref 0.0–0.5)
Eosinophils Relative: 2 %
HCT: 45.7 % (ref 36.0–46.0)
Hemoglobin: 14.5 g/dL (ref 12.0–15.0)
Immature Granulocytes: 0 %
Lymphocytes Relative: 30 %
Lymphs Abs: 1.7 10*3/uL (ref 0.7–4.0)
MCH: 28.3 pg (ref 26.0–34.0)
MCHC: 31.7 g/dL (ref 30.0–36.0)
MCV: 89.1 fL (ref 80.0–100.0)
Monocytes Absolute: 0.4 10*3/uL (ref 0.1–1.0)
Monocytes Relative: 8 %
Neutro Abs: 3.5 10*3/uL (ref 1.7–7.7)
Neutrophils Relative %: 59 %
Platelets: 208 10*3/uL (ref 150–400)
RBC: 5.13 MIL/uL — ABNORMAL HIGH (ref 3.87–5.11)
RDW: 14.8 % (ref 11.5–15.5)
WBC: 5.8 10*3/uL (ref 4.0–10.5)
nRBC: 0 % (ref 0.0–0.2)

## 2020-03-24 LAB — FERRITIN: Ferritin: 23 ng/mL (ref 11–307)

## 2020-03-24 LAB — IRON AND TIBC
Iron: 69 ug/dL (ref 28–170)
Saturation Ratios: 17 % (ref 10.4–31.8)
TIBC: 400 ug/dL (ref 250–450)
UIBC: 331 ug/dL

## 2020-03-24 LAB — FOLATE: Folate: 16.3 ng/mL (ref >5.9)

## 2020-03-25 LAB — CA 125: Cancer Antigen (CA) 125: 19.1 U/mL (ref 0.0–38.1)

## 2020-03-26 LAB — CHROMOGRANIN A: Chromogranin A (ng/mL): 534.3 ng/mL — ABNORMAL HIGH (ref 0.0–101.8)

## 2020-03-31 ENCOUNTER — Other Ambulatory Visit: Payer: Self-pay

## 2020-03-31 ENCOUNTER — Inpatient Hospital Stay: Payer: Medicare HMO | Attending: Hematology and Oncology

## 2020-03-31 DIAGNOSIS — D751 Secondary polycythemia: Secondary | ICD-10-CM | POA: Insufficient documentation

## 2020-03-31 DIAGNOSIS — E538 Deficiency of other specified B group vitamins: Secondary | ICD-10-CM | POA: Diagnosis present

## 2020-03-31 DIAGNOSIS — Z87891 Personal history of nicotine dependence: Secondary | ICD-10-CM | POA: Diagnosis not present

## 2020-03-31 DIAGNOSIS — Z85110 Personal history of malignant carcinoid tumor of bronchus and lung: Secondary | ICD-10-CM | POA: Diagnosis not present

## 2020-03-31 DIAGNOSIS — D508 Other iron deficiency anemias: Secondary | ICD-10-CM | POA: Diagnosis present

## 2020-03-31 MED ORDER — CYANOCOBALAMIN 1000 MCG/ML IJ SOLN
1000.0000 ug | Freq: Once | INTRAMUSCULAR | Status: AC
  Administered 2020-03-31: 1000 ug via INTRAMUSCULAR
  Filled 2020-03-31: qty 1

## 2020-04-12 LAB — 5 HIAA, QUANTITATIVE, URINE, 24 HOUR
5-HIAA, Ur: 1.9 mg/L
5-HIAA,Quant.,24 Hr Urine: 5.7 mg/24 hr (ref 0.0–14.9)
Total Volume: 3000

## 2020-04-13 ENCOUNTER — Other Ambulatory Visit: Payer: Self-pay | Admitting: Internal Medicine

## 2020-04-13 DIAGNOSIS — Z1231 Encounter for screening mammogram for malignant neoplasm of breast: Secondary | ICD-10-CM

## 2020-04-28 ENCOUNTER — Other Ambulatory Visit: Payer: Self-pay

## 2020-04-28 ENCOUNTER — Inpatient Hospital Stay: Payer: Medicare HMO

## 2020-04-28 DIAGNOSIS — D751 Secondary polycythemia: Secondary | ICD-10-CM

## 2020-04-28 DIAGNOSIS — E538 Deficiency of other specified B group vitamins: Secondary | ICD-10-CM | POA: Diagnosis not present

## 2020-04-28 MED ORDER — CYANOCOBALAMIN 1000 MCG/ML IJ SOLN
1000.0000 ug | Freq: Once | INTRAMUSCULAR | Status: AC
  Administered 2020-04-28: 1000 ug via INTRAMUSCULAR
  Filled 2020-04-28: qty 1

## 2020-07-15 ENCOUNTER — Telehealth: Payer: Self-pay

## 2020-07-15 NOTE — Telephone Encounter (Signed)
Patient is scheduled for annual lung screening CT scan on Thursday June 30th @ 3:30. She still has [employer] and quit smoking about 1.5 years ago. Please send text with appt info.

## 2020-07-16 ENCOUNTER — Other Ambulatory Visit: Payer: Self-pay | Admitting: *Deleted

## 2020-07-16 DIAGNOSIS — Z122 Encounter for screening for malignant neoplasm of respiratory organs: Secondary | ICD-10-CM

## 2020-07-16 DIAGNOSIS — Z87891 Personal history of nicotine dependence: Secondary | ICD-10-CM

## 2020-07-16 NOTE — Progress Notes (Signed)
Contacted and scheduled for annual CT. Patient is a former smoker, quit 12/20, 41 pack year history.

## 2020-07-30 ENCOUNTER — Ambulatory Visit: Admission: RE | Admit: 2020-07-30 | Payer: Medicare HMO | Source: Ambulatory Visit

## 2020-08-04 ENCOUNTER — Emergency Department
Admission: EM | Admit: 2020-08-04 | Discharge: 2020-08-05 | Disposition: A | Discharge status: Home / Self Care | Payer: Medicare HMO | Attending: Emergency Medicine | Admitting: Emergency Medicine

## 2020-08-04 ENCOUNTER — Emergency Department: Payer: Medicare HMO

## 2020-08-04 ENCOUNTER — Other Ambulatory Visit: Payer: Self-pay

## 2020-08-04 DIAGNOSIS — Z85110 Personal history of malignant carcinoid tumor of bronchus and lung: Secondary | ICD-10-CM | POA: Diagnosis not present

## 2020-08-04 DIAGNOSIS — I10 Essential (primary) hypertension: Secondary | ICD-10-CM | POA: Insufficient documentation

## 2020-08-04 DIAGNOSIS — M25552 Pain in left hip: Secondary | ICD-10-CM | POA: Diagnosis not present

## 2020-08-04 DIAGNOSIS — W010XXA Fall on same level from slipping, tripping and stumbling without subsequent striking against object, initial encounter: Secondary | ICD-10-CM | POA: Insufficient documentation

## 2020-08-04 DIAGNOSIS — J441 Chronic obstructive pulmonary disease with (acute) exacerbation: Secondary | ICD-10-CM | POA: Insufficient documentation

## 2020-08-04 DIAGNOSIS — Z79899 Other long term (current) drug therapy: Secondary | ICD-10-CM | POA: Diagnosis not present

## 2020-08-04 DIAGNOSIS — J45909 Unspecified asthma, uncomplicated: Secondary | ICD-10-CM | POA: Insufficient documentation

## 2020-08-04 DIAGNOSIS — S32010A Wedge compression fracture of first lumbar vertebra, initial encounter for closed fracture: Secondary | ICD-10-CM | POA: Diagnosis not present

## 2020-08-04 DIAGNOSIS — Z87891 Personal history of nicotine dependence: Secondary | ICD-10-CM | POA: Diagnosis not present

## 2020-08-04 DIAGNOSIS — Z85038 Personal history of other malignant neoplasm of large intestine: Secondary | ICD-10-CM | POA: Insufficient documentation

## 2020-08-04 DIAGNOSIS — S34109A Unspecified injury to unspecified level of lumbar spinal cord, initial encounter: Secondary | ICD-10-CM | POA: Diagnosis present

## 2020-08-04 LAB — CBC WITH DIFFERENTIAL/PLATELET
Abs Immature Granulocytes: 0.04 10*3/uL (ref 0.00–0.07)
Basophils Absolute: 0.1 10*3/uL (ref 0.0–0.1)
Basophils Relative: 1 %
Eosinophils Absolute: 0.3 10*3/uL (ref 0.0–0.5)
Eosinophils Relative: 3 %
HCT: 44.7 % (ref 36.0–46.0)
Hemoglobin: 14.2 g/dL (ref 12.0–15.0)
Immature Granulocytes: 1 %
Lymphocytes Relative: 17 %
Lymphs Abs: 1.3 10*3/uL (ref 0.7–4.0)
MCH: 29.5 pg (ref 26.0–34.0)
MCHC: 31.8 g/dL (ref 30.0–36.0)
MCV: 92.9 fL (ref 80.0–100.0)
Monocytes Absolute: 0.5 10*3/uL (ref 0.1–1.0)
Monocytes Relative: 7 %
Neutro Abs: 5.6 10*3/uL (ref 1.7–7.7)
Neutrophils Relative %: 71 %
Platelets: 150 10*3/uL (ref 150–400)
RBC: 4.81 MIL/uL (ref 3.87–5.11)
RDW: 14.8 % (ref 11.5–15.5)
WBC: 7.8 10*3/uL (ref 4.0–10.5)
nRBC: 0 % (ref 0.0–0.2)

## 2020-08-04 LAB — LIPASE, BLOOD: Lipase: 27 U/L (ref 11–51)

## 2020-08-04 LAB — COMPREHENSIVE METABOLIC PANEL
ALT: 9 U/L (ref 0–44)
AST: 17 U/L (ref 15–41)
Albumin: 3.2 g/dL — ABNORMAL LOW (ref 3.5–5.0)
Alkaline Phosphatase: 47 U/L (ref 38–126)
Anion gap: 3 — ABNORMAL LOW (ref 5–15)
BUN: 13 mg/dL (ref 8–23)
CO2: 27 mmol/L (ref 22–32)
Calcium: 8.2 mg/dL — ABNORMAL LOW (ref 8.9–10.3)
Chloride: 110 mmol/L (ref 98–111)
Creatinine, Ser: 0.57 mg/dL (ref 0.44–1.00)
GFR, Estimated: >60 mL/min (ref >60)
Glucose, Bld: 182 mg/dL — ABNORMAL HIGH (ref 70–99)
Potassium: 3.6 mmol/L (ref 3.5–5.1)
Sodium: 140 mmol/L (ref 135–145)
Total Bilirubin: 0.6 mg/dL (ref 0.3–1.2)
Total Protein: 6.3 g/dL — ABNORMAL LOW (ref 6.5–8.1)

## 2020-08-04 LAB — CK: Total CK: 35 U/L — ABNORMAL LOW (ref 38–234)

## 2020-08-04 MED ORDER — OXYCODONE-ACETAMINOPHEN 5-325 MG PO TABS: 1.0000 | ORAL_TABLET | ORAL | 0 refills | Status: AC | PRN

## 2020-08-04 MED ORDER — ONDANSETRON HCL 4 MG/2ML IJ SOLN
4.0000 mg | Freq: Once | INTRAMUSCULAR | Status: AC
  Administered 2020-08-04: 4 mg via INTRAVENOUS
  Filled 2020-08-04: qty 2

## 2020-08-04 MED ORDER — FENTANYL CITRATE (PF) 100 MCG/2ML IJ SOLN
50.0000 ug | Freq: Once | INTRAMUSCULAR | Status: AC
Start: 2020-08-04 — End: 2020-08-04
  Administered 2020-08-04: 50 ug via INTRAVENOUS
  Filled 2020-08-04: qty 2

## 2020-08-04 MED ORDER — DEXTROSE 5 % AND 0.9 % NACL IV BOLUS
1000.0000 mL | Freq: Once | INTRAVENOUS | Status: AC
  Administered 2020-08-04: 1000 mL via INTRAVENOUS
  Filled 2020-08-04: qty 1000

## 2020-08-04 MED ORDER — OXYCODONE-ACETAMINOPHEN 5-325 MG PO TABS
1.0000 | ORAL_TABLET | Freq: Once | ORAL | Status: AC
  Administered 2020-08-04: 1 via ORAL
  Filled 2020-08-04: qty 1

## 2020-08-04 NOTE — ED Notes (Signed)
Pt up to bedside commode

## 2020-08-04 NOTE — ED Notes (Signed)
ED Provider at bedside. 

## 2020-08-04 NOTE — Discharge Instructions (Addendum)
Please use walker for ambulation.  Continue with oxycodone as needed for pain.  Follow-up with neurosurgery, Dr. Lacinda Axon.  Return to the ER for any worsening symptoms or urgent changes in your health

## 2020-08-04 NOTE — ED Notes (Signed)
Pt. Provided [place] sandwich

## 2020-08-04 NOTE — ED Notes (Signed)
[employer] brace rep called.

## 2020-08-04 NOTE — ED Notes (Signed)
Pt ambulated with brace on using walker to sink and back to bed using walker.

## 2020-08-04 NOTE — ED Notes (Signed)
[employer] brace rep at bedside

## 2020-08-04 NOTE — ED Notes (Signed)
Pt in bed resting, aware urine specimen is needed.

## 2020-08-04 NOTE — Progress Notes (Signed)
Orthopedic Tech Progress Note Patient Details:  [name] May 27, 1950 [id number]  Patient ID: Alicia Holder, female   DOB: 1950/07/23, 70 y.o.   MRN: [id number] Called order into hanger Karolee Stamps 08/04/2020, 9:54 PM

## 2020-08-04 NOTE — ED Provider Notes (Signed)
[facility] Emergency Department Provider Note  ____________________________________________  Time seen: Approximately 9:00 PM  I have reviewed the triage vital signs and the nursing notes.   HISTORY  Chief Complaint Fall    HPI Alicia Holder is a 69 y.o. female with a past history of COPD, hypertension, GERD, AAA who reports that she had a trip and fall about 10 days ago.  Since then she has been having left hip pain and low back pain which is worse with standing and trying to bear weight.  Nonradiating.  No alleviating factors.  Moderate to severe.  She denies head injury or loss of consciousness.  Does not take blood thinners.  She does report that over the last 4 days she has been having urinary urgency as well as malaise and decreased oral intake from loss of appetite.  No vomiting or diarrhea.  No fever or chills or body aches.  Past Medical History:  Diagnosis Date  . AAA (abdominal aortic aneurysm) ([place])   . Arthritis    knees, elbows  . Asthma   . Colon cancer ([place])    colon  . COPD (chronic obstructive pulmonary disease) ([place])   . GERD (gastroesophageal reflux disease)   . Hypertension   . Sleep apnea      Patient Active Problem List   Diagnosis Date Noted  . Pulmonary nodules 03/03/2020  . Left ovarian cyst 07/04/2019  . Low TSH level 07/04/2019  . Iron deficiency anemia due to chronic blood loss 03/14/2019  . 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 Weight loss 03/14/2019  . Acute posthemorrhagic anemia 01/13/2019  . COPD (chronic obstructive pulmonary disease) ([place]) 01/13/2019  . H/O colon cancer, stage II 01/13/2019  . Polycythemia vera ([place]) 01/13/2019  . Severe anemia 01/12/2019  . Hemorrhagic shock ([place]) 01/12/2019  . Esophageal hemorrhage   . GI bleed   . Vitamin B12 deficiency   . HTN (hypertension)   . Hyperlipidemia   . Tobacco abuse counseling   . COPD with acute exacerbation ([place])  01/01/2019  . Acute respiratory failure with hypoxia ([place]) 01/01/2019  . COPD exacerbation ([place]) 01/01/2019  . Goals of care, counseling/discussion 05/10/2017  . Aortic aneurysm, thoracic ([place]) 03/30/2017  . Thyroid nodule 03/30/2017  . 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 ABDOMINAL HYSTERECTOMY    . APPENDECTOMY    . CATARACT EXTRACTION W/PHACO Left 05/28/2019   Procedure: CATARACT EXTRACTION PHACO AND INTRAOCULAR LENS PLACEMENT (IOC) LEFT VISION BLUE 31.19 02:18.2;  Surgeon: Birder Robson, MD;  Location: [place];  Service: Ophthalmology;  Laterality: Left;  . CATARACT EXTRACTION W/PHACO Right 06/18/2019   Procedure: CATARACT EXTRACTION PHACO AND INTRAOCULAR LENS PLACEMENT (IOC) RIGHT 34.59  02:31.7;  Surgeon: Birder Robson, MD;  Location: [place];  Service: Ophthalmology;  Laterality: Right;  sleep apnea  . ESOPHAGOGASTRODUODENOSCOPY N/A 01/12/2019   Procedure: ESOPHAGOGASTRODUODENOSCOPY (EGD);  Surgeon: Virgel Manifold, MD;  Location: [facility] ENDOSCOPY;  Service: Endoscopy;  Laterality: N/A;  . ESOPHAGOGASTRODUODENOSCOPY (EGD) WITH PROPOFOL N/A 01/14/2019   Procedure: ESOPHAGOGASTRODUODENOSCOPY (EGD) WITH PROPOFOL;  Surgeon: [name], MD;  Location: [place];  Service: Gastroenterology;  Laterality: N/A;  . HEMOSTASIS CLIP PLACEMENT  01/14/2019   Procedure: HEMOSTASIS CLIP PLACEMENT;  Surgeon: [name], MD;  Location: [place];  Service: Gastroenterology;;  . KNEE SURGERY Right    fracture repair  . TONSILLECTOMY    . TUBAL LIGATION    .  TUMOR EXCISION     colon     Prior to Admission medications   Medication Sig Start Date End Date Taking? Authorizing Provider  acetaminophen (TYLENOL) 500 MG tablet Take 1,000 mg by mouth every 6 (six) hours as needed for mild pain.    [provider]  albuterol (VENTOLIN HFA) 108 (90 Base) MCG/ACT inhaler Inhale 1-2 puffs into the  lungs every 4 (four) hours as needed for wheezing or shortness of breath.  07/09/18   [provider]  alendronate (FOSAMAX) 70 MG tablet Take 70 mg by mouth once a week. 07/26/17   [provider]  buPROPion (WELLBUTRIN XL) 150 MG 24 hr tablet Take 150 mg by mouth daily.  02/15/19   [provider]  docusate sodium (COLACE) 100 MG capsule Take 100 mg by mouth daily.    [provider]  ipratropium-albuterol (DUONEB) 0.5-2.5 (3) MG/3ML SOLN Take 3 mLs by nebulization every 4 (four) hours as needed. 01/05/19   Fritzi Mandes, MD  lisinopril (ZESTRIL) 20 MG tablet Take 20 mg by mouth daily.     [provider]  montelukast (SINGULAIR) 10 MG tablet Take 10 mg by mouth at bedtime. 11/14/16   [provider]  omeprazole (PRILOSEC) 20 MG capsule Take 20 mg by mouth 2 (two) times daily. 09/02/19   [provider]  oxyCODONE (OXY IR/ROXICODONE) 5 MG immediate release tablet Take 5 mg by mouth 3 (three) times daily as needed for moderate pain or breakthrough pain.     [provider]  pantoprazole (PROTONIX) 40 MG tablet TAKE 1 TABLET BY MOUTH EVERY DAY 05/30/19   Lequita Asal, MD  sertraline (ZOLOFT) 100 MG tablet Take 100 mg by mouth daily. 05/22/19   [provider]  simvastatin (ZOCOR) 20 MG tablet Take 20 mg by mouth daily. 11/14/16   [provider]  TRELEGY ELLIPTA 100-62.5-25 MCG/INH AEPB Inhale 1 puff into the lungs daily.  02/20/19   [provider]     Allergies Aspirin   Family History  Problem Relation Age of Onset  . CAD Mother   . CAD Father     Social History Social History   Tobacco Use  . Smoking status: Former    Packs/day: 1.00    Years: 41.00    Pack years: 41.00    Types: Cigarettes    Quit date: 01/01/2019    Years since quitting: 1.5  . Smokeless tobacco: Never  Vaping Use  . Vaping Use: Never used  Substance Use Topics  . Alcohol use: Yes    Comment: rare- couple times  per year  . Drug use: Never    Review of Systems  Constitutional:   No fever or chills.  ENT:   No sore throat. No rhinorrhea. Cardiovascular:   No chest pain or syncope. Respiratory:   No dyspnea or cough. Gastrointestinal:   Negative for abdominal pain, vomiting and diarrhea.  Musculoskeletal:   Positive left hip pain and low back pain All other systems reviewed and are negative except as documented above in ROS and HPI.  ____________________________________________   PHYSICAL EXAM:  VITAL SIGNS: ED Triage Vitals  Enc Vitals Group     BP 08/04/20 1800 (!) 150/95     Pulse Rate 08/04/20 1800 93     Resp 08/04/20 1800 18     Temp 08/04/20 1800 98 F (36.7 C)     Temp Source 08/04/20 2051 Oral     SpO2 08/04/20 1800 92 %  Weight --      Height --      Head Circumference --      Peak Flow --      Pain Score 08/04/20 1759 10     Pain Loc --      Pain Edu? --      Excl. in [place]? --     Vital signs reviewed, nursing assessments reviewed.   Constitutional:   Alert and oriented. Non-toxic appearance. Eyes:   Conjunctivae are normal. EOMI. PERRL. ENT      Head:   Normocephalic and atraumatic.      Nose:   Normal      Mouth/Throat: Dry mucous membranes      Neck:   No meningismus. Full ROM. Hematological/Lymphatic/Immunilogical:   No cervical lymphadenopathy. Cardiovascular:   RRR. Symmetric bilateral radial and DP pulses.  No murmurs. Cap refill less than 2 seconds. Respiratory:   Normal respiratory effort without tachypnea/retractions. Breath sounds are clear and equal bilaterally. No wheezes/rales/rhonchi. Gastrointestinal:   Soft and nontender. Non distended. There is no CVA tenderness.  No rebound, rigidity, or guarding. Genitourinary:   deferred Musculoskeletal:   There is tenderness at the left hip and pain with passive flexion of the left hip.  There is tenderness of the lumbar spine as well.  No open wounds.  No deformities or limb shortening.  Pelvis is  stable. Neurologic:   Normal speech and language.  Motor grossly intact. No acute focal neurologic deficits are appreciated.  Skin:    Skin is warm, dry and intact. No rash noted.  No petechiae, purpura, or bullae.  ____________________________________________    LABS (pertinent positives/negatives) (all labs ordered are listed, but only abnormal results are displayed) Labs Reviewed  COMPREHENSIVE METABOLIC PANEL  LIPASE, BLOOD  CBC WITH DIFFERENTIAL/PLATELET  URINALYSIS, COMPLETE (UACMP) WITH MICROSCOPIC  CK   ____________________________________________   EKG  Interpreted by me Normal sinus rhythm rate of 79, normal axis and intervals.  Poor R wave progression.  Normal ST segments and T waves.  ____________________________________________    RADIOLOGY  DG Chest 1 View  Result Date: 08/04/2020 CLINICAL DATA:  Left hip and low back pain after fall. Fall 2 days ago. EXAM: CHEST  1 VIEW COMPARISON:  Chest radiograph 01/11/2019.  CT 07/18/2019 FINDINGS: Left lateral costophrenic angle is excluded from the field of view. The heart is normal in size. Aortic atherosclerosis and tortuosity. Slight rightward tracheal deviation is due to enlarged left lobe of the thyroid gland. Chronic interstitial coarsening. No acute airspace disease. No pneumothorax or large pleural effusion. Right lateral sixth rib fracture is chronic. No acute osseous abnormalities are seen. IMPRESSION: 1. No acute abnormality. 2. Chronic interstitial coarsening. Electronically Signed   By: Keith Rake M.D.   On: 08/04/2020 19:25   DG Lumbar Spine 2-3 Views  Result Date: 08/04/2020 CLINICAL DATA:  Left hip and low back pain after fall. EXAM: LUMBAR SPINE - 2-3 VIEW COMPARISON:  Reformats from chest CT 07/18/2019. FINDINGS: The twelfth ribs are diminutive. Bones are diffusely under mineralized. Severe L1 compression fracture with greater than 70% loss of height is new from prior exam. There is a chronic L3  compression fracture. Levoscoliosis is unchanged. Multilevel degenerative disc disease and facet hypertrophy. Sacroiliac joints are congruent. IMPRESSION: 1. Severe L1 compression fracture with greater than 70% loss of height, new from June of 2021. 2. Chronic L3 compression fracture. 3. Multilevel spondylosis and scoliosis. Electronically Signed   By: Keith Rake M.D.   On:  08/04/2020 19:28   DG Hip Unilat W or Wo Pelvis 2-3 Views Left  Result Date: 08/04/2020 CLINICAL DATA:  Left hip pain after fall 2 days ago. EXAM: DG HIP (WITH OR WITHOUT PELVIS) 2-3V LEFT COMPARISON:  CT abdomen pelvis dated January 11, 2019. FINDINGS: There is no evidence of hip fracture or dislocation. There is no evidence of arthropathy or other focal bone abnormality. Osteopenia. IMPRESSION: Negative. Electronically Signed   By: Titus Dubin M.D.   On: 08/04/2020 19:24    ____________________________________________   PROCEDURES Procedures  ____________________________________________  DIFFERENTIAL DIAGNOSIS   Hip fracture, lumbar spine fracture, UTI, electrolyte abnormality, dehydration  CLINICAL IMPRESSION / ASSESSMENT AND PLAN / ED COURSE  Medications ordered in the ED: Medications  fentaNYL (SUBLIMAZE) injection 50 mcg (50 mcg Intravenous Given 08/04/20 2044)  ondansetron (ZOFRAN) injection 4 mg (4 mg Intravenous Given 08/04/20 2043)  dextrose 5 % and 0.9% NaCl 5-0.9 % bolus 1,000 mL (1,000 mLs Intravenous New Bag/Given 08/04/20 2047)    Pertinent labs & imaging results that were available during my care of the patient were reviewed by me and considered in my medical decision making (see chart for details).  Alicia Holder was evaluated in Emergency Department on 08/04/2020 for the symptoms described in the history of present illness. She was evaluated in the context of the global COVID-19 pandemic, which necessitated consideration that the patient might be at risk for infection with the SARS-CoV-2 virus  that causes COVID-19. Institutional protocols and algorithms that pertain to the evaluation of patients at risk for COVID-19 are in a state of rapid change based on information released by regulatory bodies including the CDC and federal and state organizations. These policies and algorithms were followed during the patient's care in the ED.   Patient presents with a fall 10 days ago and persistent musculoskeletal pain since then.  Hip pelvis and chest x-ray unremarkable.  Lumbar spine x-ray shows a 70% loss of height compression fracture of L1.  She is neurologically intact.  Will give IV fluids for hydration, fentanyl 50 mcg IV for pain relief, check labs.  Clinical Course as of 08/04/20 2118  Tue Aug 04, 2020  2117 L1 compression fx d/w nsgy Dr. Lacinda Axon - agrees with TLSO brace.  Due to osteoporosis, will need to manage initially non-operatively if possible, can f/u in clinic if pain is controlled. [PS]    Clinical Course User Index [PS] Carrie Mew, MD     ____________________________________________   FINAL CLINICAL IMPRESSION(S) / ED DIAGNOSES    Final diagnoses:  Closed compression fracture of L1 vertebra, initial encounter [facility])     ED Discharge Orders     None       Portions of this note were generated with dragon dictation software. Dictation errors may occur despite best attempts at proofreading.    Carrie Mew, MD 08/04/20 2104

## 2020-08-04 NOTE — ED Triage Notes (Signed)
Pt comes via EMS from home with c/o left hip pain and back pain;. Pt states fall two days ago. Pt states pain when trying to bear weight.  Pt states she tripped and fell. No loc or blood thinners

## 2020-08-04 NOTE — ED Notes (Signed)
Pt. Provided walker in room.

## 2020-08-04 NOTE — ED Triage Notes (Signed)
Pt comes into the ED via EMS from home states she tripped and fell on Saturday injuring her left hip and ankle.

## 2020-08-05 NOTE — ED Provider Notes (Signed)
-----------------------------------------   12:04 AM on 08/05/2020 -----------------------------------------  Blood pressure (!) 132/96, pulse 88, temperature 98 F (36.7 C), temperature source Oral, resp. rate 18, SpO2 (!) 89 %.  Assuming care from Dr. Joni Fears.  In short, Alicia Holder is a 70 y.o. female with a chief complaint of fall.  Patient suffered L1 compression fracture.  Neurosurgery was consulted and TLSO brace was applied.  Labs are normal.  Pain well controlled and patient able to safely ambulate with a walker here in the emergency department.  She feels safe and comfortable going home and will follow-up outpatient with neurosurgery. .  Refer to the original H&P for additional details.  The current plan of care is to discharge home.  She will follow-up with outpatient neurosurgery.  She understands signs and symptoms to return to the ER for.    Duanne Guess, PA-C 08/05/20 0006    Carrie Mew, MD 08/08/20 [phone]

## 2020-09-23 ENCOUNTER — Ambulatory Visit: Payer: Medicare HMO | Attending: Hematology and Oncology

## 2020-09-25 ENCOUNTER — Other Ambulatory Visit: Payer: Medicare HMO

## 2020-09-28 ENCOUNTER — Other Ambulatory Visit: Payer: Self-pay

## 2020-09-28 DIAGNOSIS — Z122 Encounter for screening for malignant neoplasm of respiratory organs: Secondary | ICD-10-CM

## 2020-09-28 DIAGNOSIS — D500 Iron deficiency anemia secondary to blood loss (chronic): Secondary | ICD-10-CM

## 2020-09-29 ENCOUNTER — Other Ambulatory Visit: Payer: Self-pay

## 2020-09-29 ENCOUNTER — Inpatient Hospital Stay: Payer: Medicare HMO | Attending: Internal Medicine | Admitting: Internal Medicine

## 2020-09-29 ENCOUNTER — Inpatient Hospital Stay (HOSPITAL_BASED_OUTPATIENT_CLINIC_OR_DEPARTMENT_OTHER): Payer: Medicare HMO | Admitting: Internal Medicine

## 2020-09-29 DIAGNOSIS — E538 Deficiency of other specified B group vitamins: Secondary | ICD-10-CM | POA: Diagnosis present

## 2020-09-29 DIAGNOSIS — N83202 Unspecified ovarian cyst, left side: Secondary | ICD-10-CM | POA: Insufficient documentation

## 2020-09-29 DIAGNOSIS — Z79899 Other long term (current) drug therapy: Secondary | ICD-10-CM | POA: Insufficient documentation

## 2020-09-29 DIAGNOSIS — I10 Essential (primary) hypertension: Secondary | ICD-10-CM | POA: Insufficient documentation

## 2020-09-29 DIAGNOSIS — Z85030 Personal history of malignant carcinoid tumor of large intestine: Secondary | ICD-10-CM | POA: Diagnosis present

## 2020-09-29 DIAGNOSIS — R978 Other abnormal tumor markers: Secondary | ICD-10-CM | POA: Diagnosis not present

## 2020-09-29 DIAGNOSIS — Z122 Encounter for screening for malignant neoplasm of respiratory organs: Secondary | ICD-10-CM

## 2020-09-29 DIAGNOSIS — D500 Iron deficiency anemia secondary to blood loss (chronic): Secondary | ICD-10-CM

## 2020-09-29 DIAGNOSIS — D751 Secondary polycythemia: Secondary | ICD-10-CM | POA: Diagnosis present

## 2020-09-29 DIAGNOSIS — D509 Iron deficiency anemia, unspecified: Secondary | ICD-10-CM | POA: Diagnosis not present

## 2020-09-29 LAB — CBC WITH DIFFERENTIAL/PLATELET
Abs Immature Granulocytes: 0.02 10*3/uL (ref 0.00–0.07)
Basophils Absolute: 0.1 10*3/uL (ref 0.0–0.1)
Basophils Relative: 1 %
Eosinophils Absolute: 0.3 10*3/uL (ref 0.0–0.5)
Eosinophils Relative: 3 %
HCT: 44.6 % (ref 36.0–46.0)
Hemoglobin: 14.7 g/dL (ref 12.0–15.0)
Immature Granulocytes: 0 %
Lymphocytes Relative: 25 %
Lymphs Abs: 2.2 10*3/uL (ref 0.7–4.0)
MCH: 29.3 pg (ref 26.0–34.0)
MCHC: 33 g/dL (ref 30.0–36.0)
MCV: 89 fL (ref 80.0–100.0)
Monocytes Absolute: 0.6 10*3/uL (ref 0.1–1.0)
Monocytes Relative: 7 %
Neutro Abs: 5.5 10*3/uL (ref 1.7–7.7)
Neutrophils Relative %: 64 %
Platelets: 234 10*3/uL (ref 150–400)
RBC: 5.01 MIL/uL (ref 3.87–5.11)
RDW: 16 % — ABNORMAL HIGH (ref 11.5–15.5)
WBC: 8.7 10*3/uL (ref 4.0–10.5)
nRBC: 0 % (ref 0.0–0.2)

## 2020-09-29 LAB — IRON AND TIBC
Iron: 54 ug/dL (ref 28–170)
Saturation Ratios: 13 % (ref 10.4–31.8)
TIBC: 402 ug/dL (ref 250–450)
UIBC: 348 ug/dL

## 2020-09-29 LAB — FERRITIN: Ferritin: 28 ng/mL (ref 11–307)

## 2020-09-29 NOTE — Assessment & Plan Note (Addendum)
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  Hold off any phlebotomy.  #History of iron deficient anemia/GI bleed [202 EGD]-hemoglobin stable  # B12 deficiency: continue B12 supplementation  # History of carcinoid tumor: Chromogranin was 926.7 (high) on 02/13/2019.             Unclear if elevation due to recurrent carcinoid, chronic atrophic gastritis or PPI.             24 hour urine for 5HIAA was 2.4 mg/24 hours (0-14.9).             No plan for octreotide scan or DOTATE PET scan.             Continue to monitor. #Thyroid nodules and low TS:  Follow-up thyroid ultrasound in 04/2022 # Left ovarian cyst- Patient is asymptomatic; inue surveillance. # Smoker/lung nodules continue surveillance low-dose CT.    # DISPOSITION; # follow up in 6 months- MD labs- cbc/cmp;ionr studies/ferritin/B12- Dr.B

## 2020-09-29 NOTE — Progress Notes (Signed)
[facility] OFFICE PROGRESS NOTE  Patient Care Team: Quesada as PCP - General Lucilla Lame, MD as Consulting Physician (Gastroenterology) Lequita Asal, MD (Inactive) as Referring Physician (Hematology and Oncology)   Cancer Staging  No matching staging information was found for the patient.   Oncology History   No history exists.      INTERVAL HISTORY: walks with a cane; alone   Alicia Holder 70 y.o.  female pleasant patient above history of erythrocytosis is here for follow-up.  Patient had a recent fall.  Complains of compression fracture.  Lung cancer screening CT scan on hold.  Chronic shortness of breath chronic fatigue chronic back pain.  Review of Systems  Constitutional:  Positive for malaise/fatigue. Negative for chills, diaphoresis, fever and weight loss.  HENT:  Negative for nosebleeds and sore throat.   Eyes:  Negative for double vision.  Respiratory:  Positive for shortness of breath. Negative for cough, hemoptysis, sputum production and wheezing.   Cardiovascular:  Negative for chest pain, palpitations, orthopnea and leg swelling.  Gastrointestinal:  Negative for abdominal pain, blood in stool, constipation, diarrhea, heartburn, melena, nausea and vomiting.  Genitourinary:  Negative for dysuria, frequency and urgency.  Musculoskeletal:  Positive for back pain and joint pain.  Skin: Negative.  Negative for itching and rash.  Neurological:  Negative for dizziness, tingling, focal weakness, weakness and headaches.  Endo/Heme/Allergies:  Does not bruise/bleed easily.  Psychiatric/Behavioral:  Negative for depression. The patient is not nervous/anxious and does not have insomnia.      PAST MEDICAL HISTORY :  Past Medical History:  Diagnosis Date   AAA (abdominal aortic aneurysm)    Arthritis    knees, elbows   Asthma    Colon cancer ([facility])    colon   COPD (chronic obstructive pulmonary disease) ([facility])    GERD  (gastroesophageal reflux disease)    Hypertension    Sleep apnea     PAST SURGICAL HISTORY :   Past Surgical History:  Procedure Laterality Date   ABDOMINAL HYSTERECTOMY     APPENDECTOMY     CATARACT EXTRACTION W/PHACO Left 05/28/2019   Procedure: CATARACT EXTRACTION PHACO AND INTRAOCULAR LENS PLACEMENT ([place]) LEFT VISION BLUE 31.19 02:18.2;  Surgeon: Birder Robson, MD;  Location: [place];  Service: Ophthalmology;  Laterality: Left;   CATARACT EXTRACTION W/PHACO Right 06/18/2019   Procedure: CATARACT EXTRACTION PHACO AND INTRAOCULAR LENS PLACEMENT (IOC) RIGHT 34.59  02:31.7;  Surgeon: Birder Robson, MD;  Location: [place];  Service: Ophthalmology;  Laterality: Right;  sleep apnea   ESOPHAGOGASTRODUODENOSCOPY N/A 01/12/2019   Procedure: ESOPHAGOGASTRODUODENOSCOPY (EGD);  Surgeon: Virgel Manifold, MD;  Location: [facility] ENDOSCOPY;  Service: Endoscopy;  Laterality: N/A;   ESOPHAGOGASTRODUODENOSCOPY (EGD) WITH PROPOFOL N/A 01/14/2019   Procedure: ESOPHAGOGASTRODUODENOSCOPY (EGD) WITH PROPOFOL;  Surgeon: [name], MD;  Location: [place];  Service: Gastroenterology;  Laterality: N/A;   HEMOSTASIS CLIP PLACEMENT  01/14/2019   Procedure: HEMOSTASIS CLIP PLACEMENT;  Surgeon: [name], MD;  Location: [facility] ENDOSCOPY;  Service: Gastroenterology;;   KNEE SURGERY Right    fracture repair   TONSILLECTOMY     TUBAL LIGATION     TUMOR EXCISION     colon    FAMILY HISTORY :   Family History  Problem Relation Age of Onset   CAD Mother    CAD Father     SOCIAL HISTORY:   Social History   Tobacco Use   Smoking status: Former    Packs/day:  1.00    Years: 41.00    Pack years: 41.00    Types: Cigarettes    Quit date: 01/01/2019    Years since quitting: 2.2   Smokeless tobacco: Never  Vaping Use   Vaping Use: Never used  Substance Use Topics   Alcohol use: Yes    Comment: rare- couple times per year   Drug use: Never    ALLERGIES:  is allergic to  aspirin.  MEDICATIONS:  Current Outpatient Medications  Medication Sig Dispense Refill   acetaminophen (TYLENOL) 500 MG tablet Take 1,000 mg by mouth every 6 (six) hours as needed for mild pain.     albuterol (VENTOLIN HFA) 108 (90 Base) MCG/ACT inhaler Inhale 1-2 puffs into the lungs every 4 (four) hours as needed for wheezing or shortness of breath.      alendronate (FOSAMAX) 70 MG tablet Take 70 mg by mouth once a week.     buPROPion (WELLBUTRIN XL) 150 MG 24 hr tablet Take 150 mg by mouth daily.      docusate sodium (COLACE) 100 MG capsule Take 100 mg by mouth daily.     ipratropium-albuterol (DUONEB) 0.5-2.5 (3) MG/3ML SOLN Take 3 mLs by nebulization every 4 (four) hours as needed. 360 mL 0   lisinopril (ZESTRIL) 20 MG tablet Take 20 mg by mouth daily.      montelukast (SINGULAIR) 10 MG tablet Take 10 mg by mouth at bedtime.     omeprazole (PRILOSEC) 20 MG capsule Take 20 mg by mouth 2 (two) times daily.     oxyCODONE (OXY IR/ROXICODONE) 5 MG immediate release tablet Take 5 mg by mouth 3 (three) times daily as needed for moderate pain or breakthrough pain.      pantoprazole (PROTONIX) 40 MG tablet TAKE 1 TABLET BY MOUTH EVERY DAY 90 tablet 0   sertraline (ZOLOFT) 100 MG tablet Take 100 mg by mouth daily.     simvastatin (ZOCOR) 20 MG tablet Take 20 mg by mouth daily.     TRELEGY ELLIPTA 100-62.5-25 MCG/INH AEPB Inhale 1 puff into the lungs daily.      No current facility-administered medications for this visit.    PHYSICAL EXAMINATION: ECOG PERFORMANCE STATUS: 0 - Asymptomatic  BP 122/86   Pulse 97   Temp 99 F (37.2 C) (Oral)   Resp 20   Ht '5\' 4"'$  (1.626 m)   Wt 163 lb 2.3 oz (74 kg)   BMI 28.00 kg/m   Filed Weights   09/29/20 1325  Weight: 163 lb 2.3 oz (74 kg)    Physical Exam Vitals and nursing note reviewed.  HENT:     Head: Normocephalic and atraumatic.     Mouth/Throat:     Pharynx: Oropharynx is clear.  Eyes:     Extraocular Movements: Extraocular movements  intact.     Pupils: Pupils are equal, round, and reactive to light.  Cardiovascular:     Rate and Rhythm: Normal rate and regular rhythm.  Pulmonary:     Comments: Decreased breath sounds bilaterally.  Abdominal:     Palpations: Abdomen is soft.  Musculoskeletal:        General: Normal range of motion.     Cervical back: Normal range of motion.  Skin:    General: Skin is warm.  Neurological:     General: No focal deficit present.     Mental Status: She is alert and oriented to person, place, and time.  Psychiatric:        Behavior: Behavior  normal.        Judgment: Judgment normal.       LABORATORY DATA:  I have reviewed the data as listed    Component Value Date/Time   NA 140 08/04/2020 2245   K 3.6 08/04/2020 2245   CL 110 08/04/2020 2245   CO2 27 08/04/2020 2245   GLUCOSE 182 (H) 08/04/2020 2245   BUN 13 08/04/2020 2245   CREATININE 0.57 08/04/2020 2245   CALCIUM 8.2 (L) 08/04/2020 2245   PROT 6.3 (L) 08/04/2020 2245   ALBUMIN 3.2 (L) 08/04/2020 2245   AST 17 08/04/2020 2245   ALT 9 08/04/2020 2245   ALKPHOS 47 08/04/2020 2245   BILITOT 0.6 08/04/2020 2245   GFRNONAA >60 08/04/2020 2245   GFRAA >60 02/21/2019 0944    No results found for: SPEP, UPEP  Lab Results  Component Value Date   WBC 7.2 03/30/2021   NEUTROABS 5.0 03/30/2021   HGB 14.9 03/30/2021   HCT 47.5 (H) 03/30/2021   MCV 92.1 03/30/2021   PLT 205 03/30/2021      Chemistry      Component Value Date/Time   NA 140 08/04/2020 2245   K 3.6 08/04/2020 2245   CL 110 08/04/2020 2245   CO2 27 08/04/2020 2245   BUN 13 08/04/2020 2245   CREATININE 0.57 08/04/2020 2245      Component Value Date/Time   CALCIUM 8.2 (L) 08/04/2020 2245   ALKPHOS 47 08/04/2020 2245   AST 17 08/04/2020 2245   ALT 9 08/04/2020 2245   BILITOT 0.6 08/04/2020 2245       RADIOGRAPHIC STUDIES: I have personally reviewed the radiological images as listed and agreed with the findings in the report. No results  found.   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  Hold off any phlebotomy.  #History of iron deficient anemia/GI bleed [202 EGD]-hemoglobin stable  #  B12 deficiency: continue B12 supplementation  # History of carcinoid tumor: Chromogranin was 926.7 (high) on 02/13/2019.             Unclear if elevation due to recurrent carcinoid, chronic atrophic gastritis or PPI.             24 hour urine for 5HIAA was 2.4 mg/24 hours (0-14.9).             No plan for octreotide scan or DOTATE PET scan.             Continue to monitor. #   Thyroid nodules and low TS:  Follow-up thyroid ultrasound in 04/2022 # Left ovarian cyst- Patient is asymptomatic; inue surveillance. # Smoker/lung nodules continue surveillance low-dose CT.    # DISPOSITION; # follow up in 6 months- MD labs- cbc/cmp;ionr studies/ferritin/B12- Dr.B   No orders of the defined types were placed in this encounter.   All questions were answered. The patient knows to call the clinic with any problems, questions or concerns.      Cammie Sickle, MD 03/30/2021 1:30 PM

## 2020-09-30 LAB — CA 125: Cancer Antigen (CA) 125: 19.7 U/mL (ref 0.0–38.1)

## 2020-10-19 IMAGING — CT CT HEAD W/O CM
3 series · 15 of 45 positions shown, 18 images · non-contrast
Comparison: None.

CLINICAL DATA: Weakness, fall

EXAM:
CT HEAD WITHOUT CONTRAST
TECHNIQUE: Contiguous axial images were obtained from the base of the skull
through the vertex without intravenous contrast.

[Series 2: head wo · axial · 0.41mm/px · z∈[-91,+24]mm · 9 of 28 slices shown, 12 images]
[im 3/28  brain]
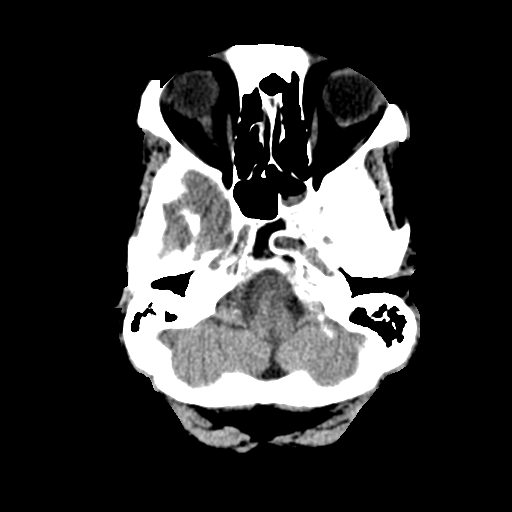
[im 3/28  bone]
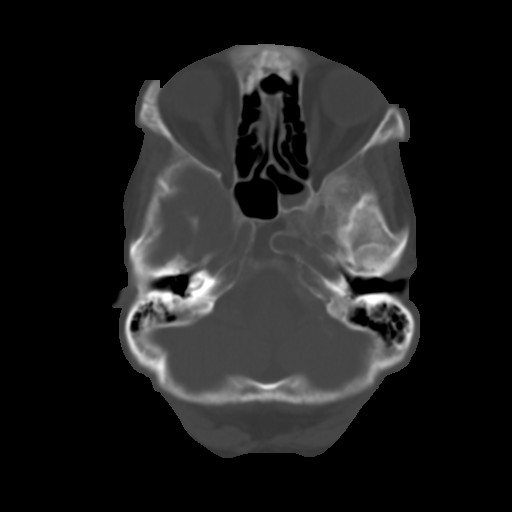
[im 6/28  brain]
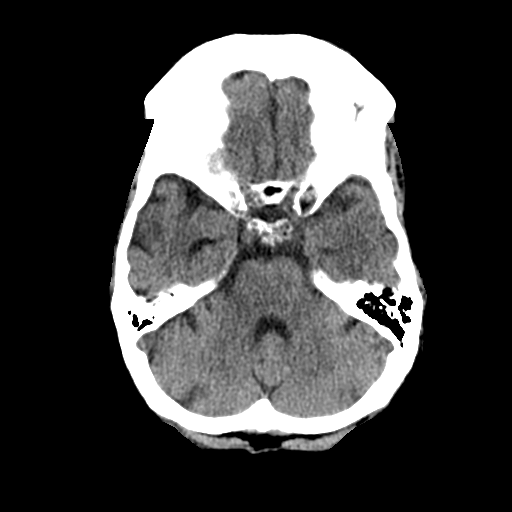
[im 9/28  brain]
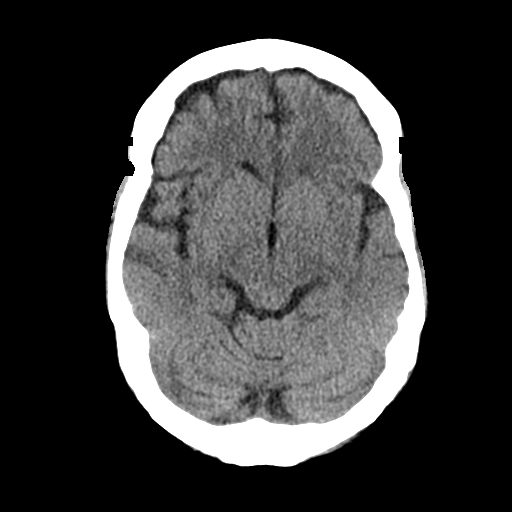
[im 12/28  brain]
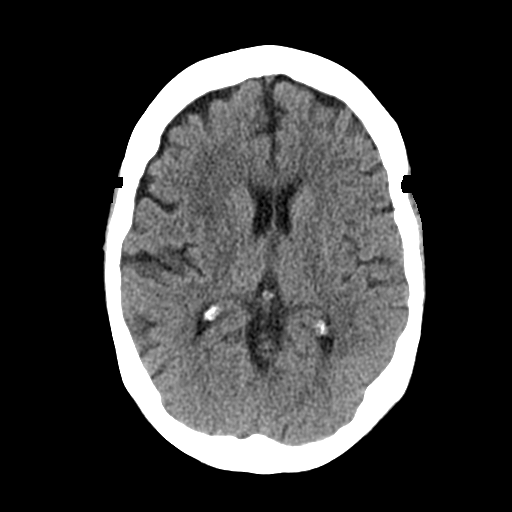
[im 15/28  brain]
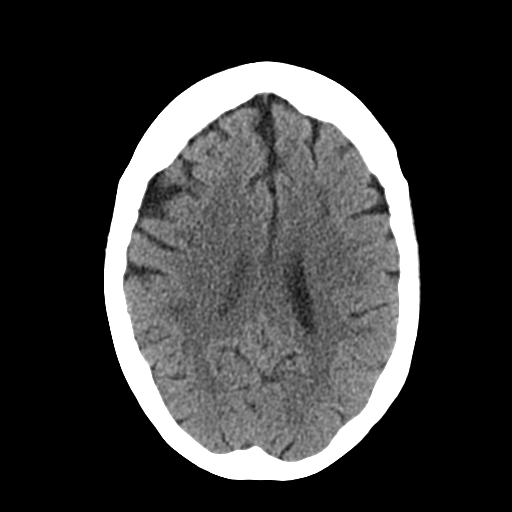
[im 15/28  bone]
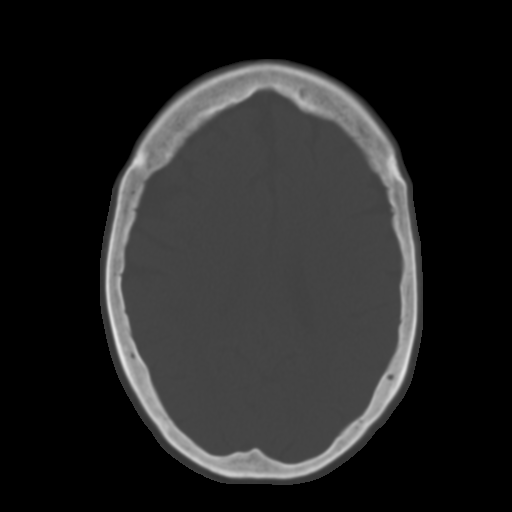
[im 17/28  brain]
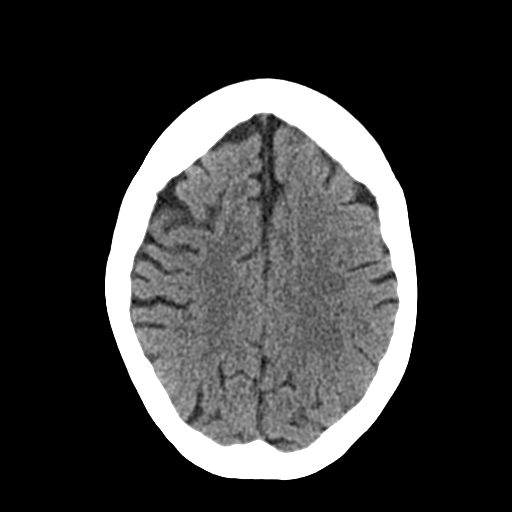
[im 20/28  brain]
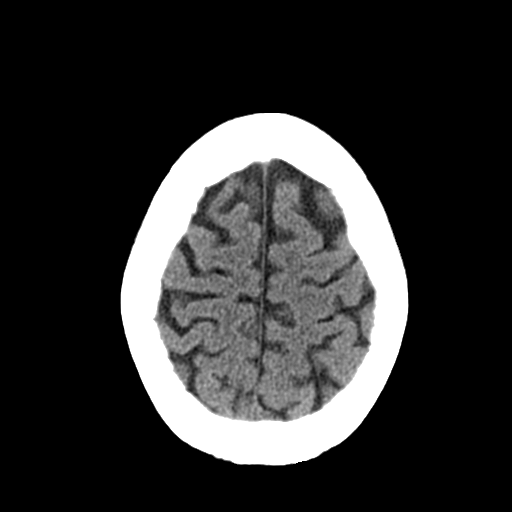
[im 23/28  brain]
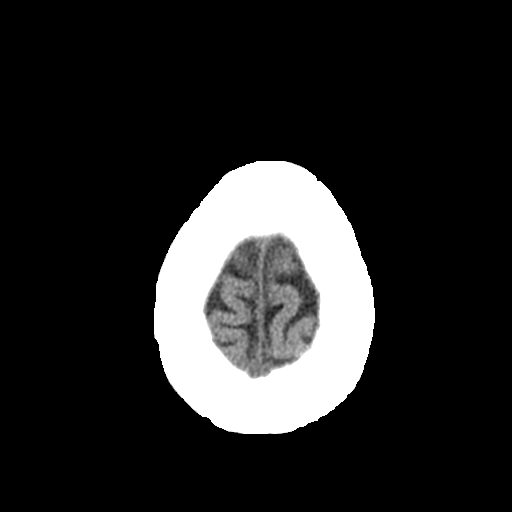
[im 26/28  brain]
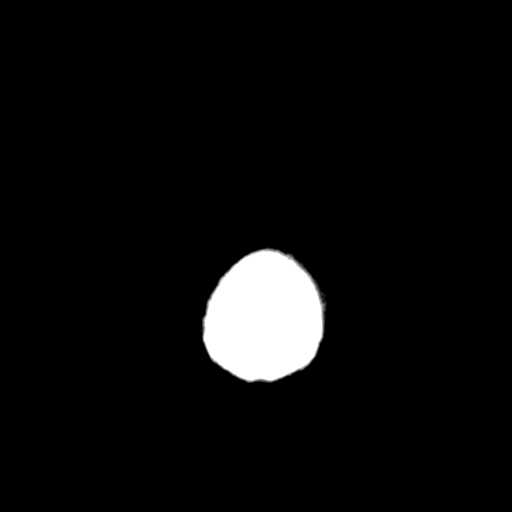
[im 26/28  bone]
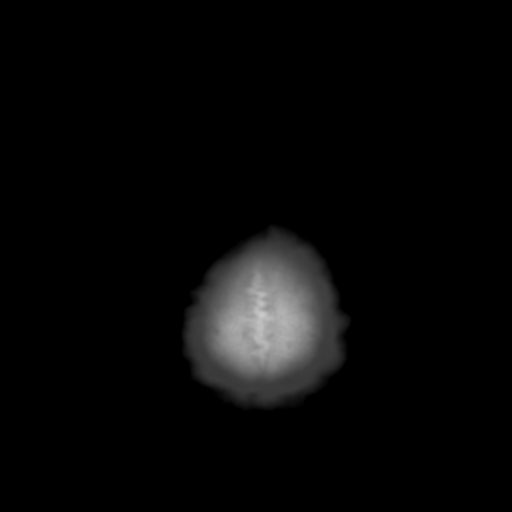

[Series 4: coronal soft tissue · coronal · 0.27mm/px · 3 of 62 slices shown]
[im 21/62  brain]
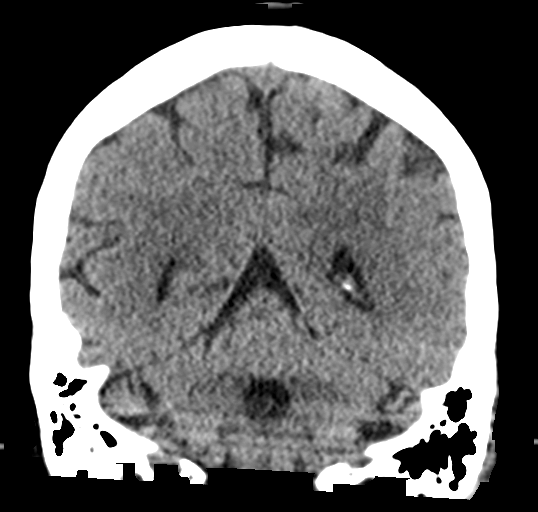
[im 28/62  brain]
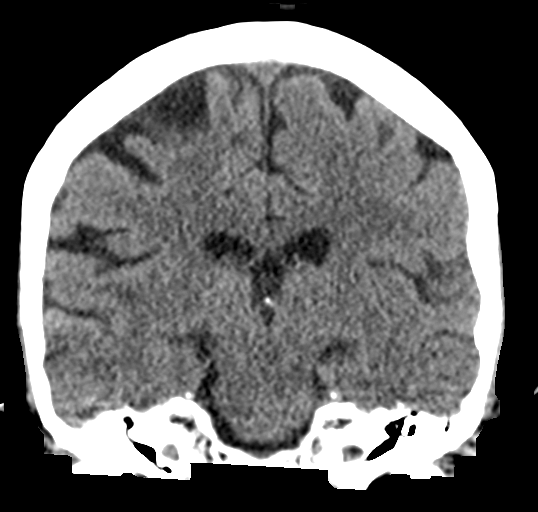
[im 34/62  brain]
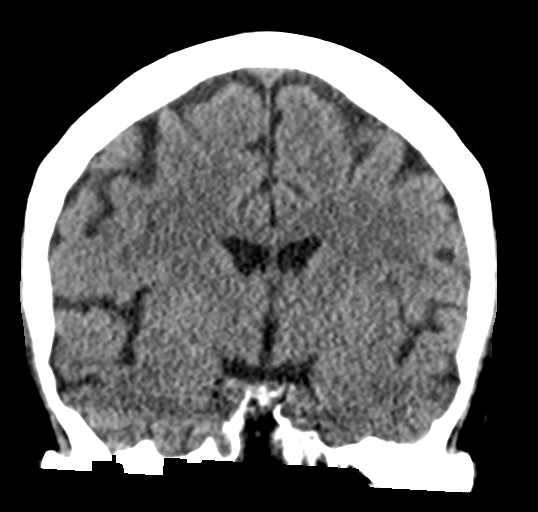

[Series 5: sagittal soft tissue · sagittal · 0.27mm/px · 3 of 47 slices shown]
[im 16/47  brain]
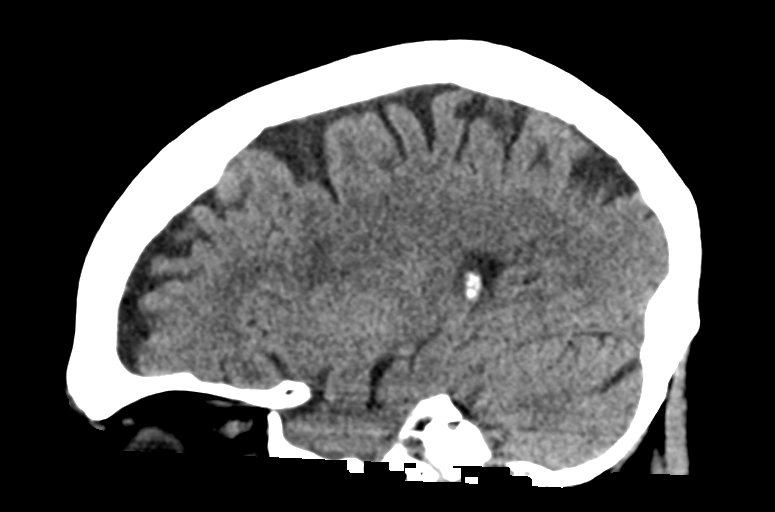
[im 24/47  brain]
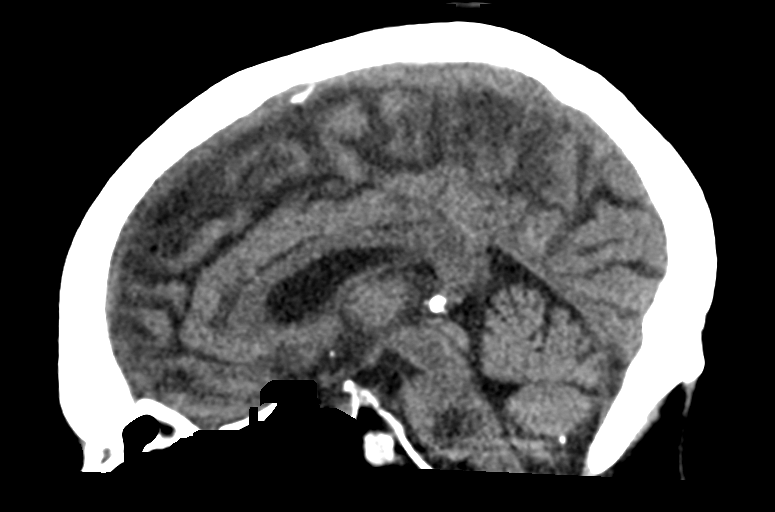
[im 31/47  brain]
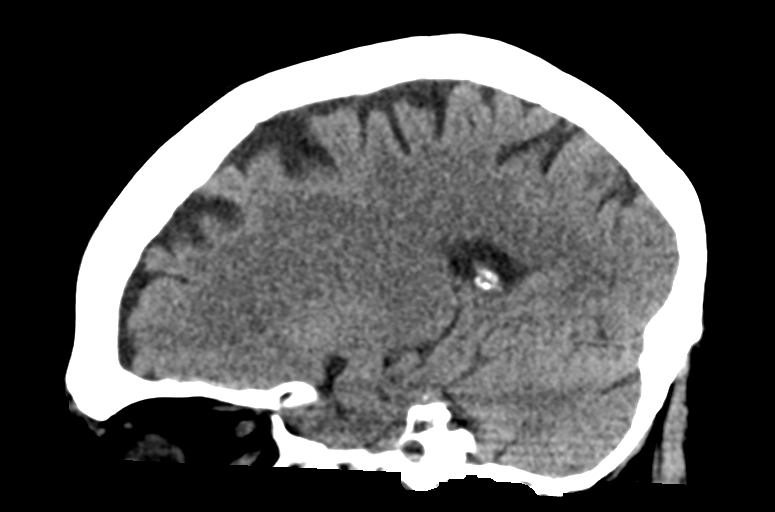

[15 of 45 positions shown; findings below may reference images not displayed]

FINDINGS: Brain: No evidence of acute infarction, hemorrhage, hydrocephalus,
extra-axial collection or mass lesion/mass effect. Minimal scattered
low-density changes within the periventricular and subcortical white
matter suggesting sequela of chronic microvascular ischemic disease.

Vascular: No hyperdense vessel or unexpected calcification.

Skull: Normal. Negative for fracture or focal lesion.

Sinuses/Orbits: Mucosal thickening within the left sphenoid sinus.
Orbital structures appear unremarkable.

Other: None.
IMPRESSION: 1. No acute intracranial findings.
2. Left sphenoid sinus disease.

## 2020-10-19 IMAGING — US US ABDOMEN LIMITED
1 series · 14 of 25 positions shown · non-contrast
Comparison: None.

CLINICAL DATA: Right upper quadrant pain and jaundice

EXAM:
ULTRASOUND ABDOMEN LIMITED RIGHT UPPER QUADRANT

[Series 1: us abdomen limited · 14 of 65 slices shown]
[im 1/65]
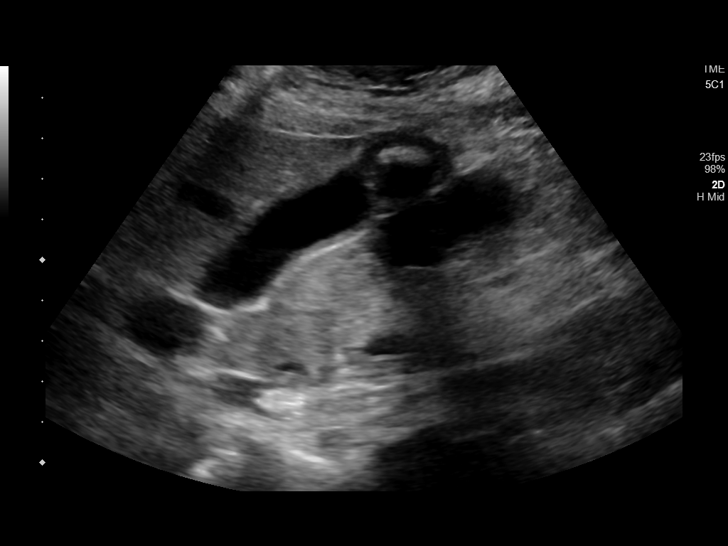
[im 6/65]
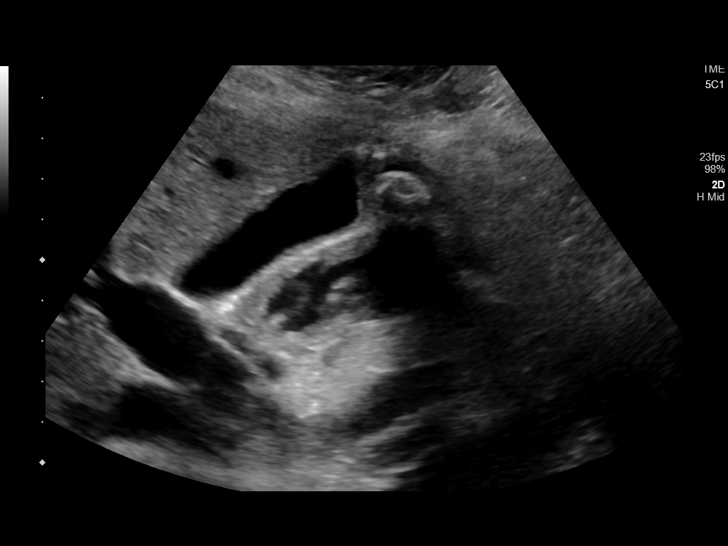
[im 11/65]
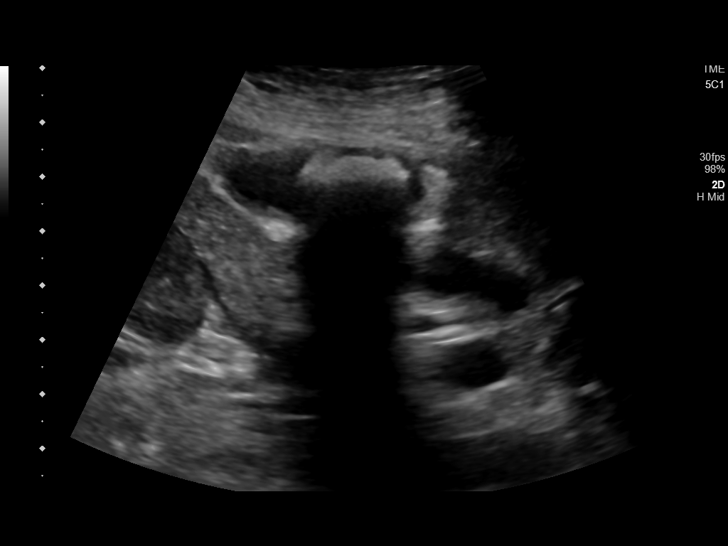
[im 17/65]
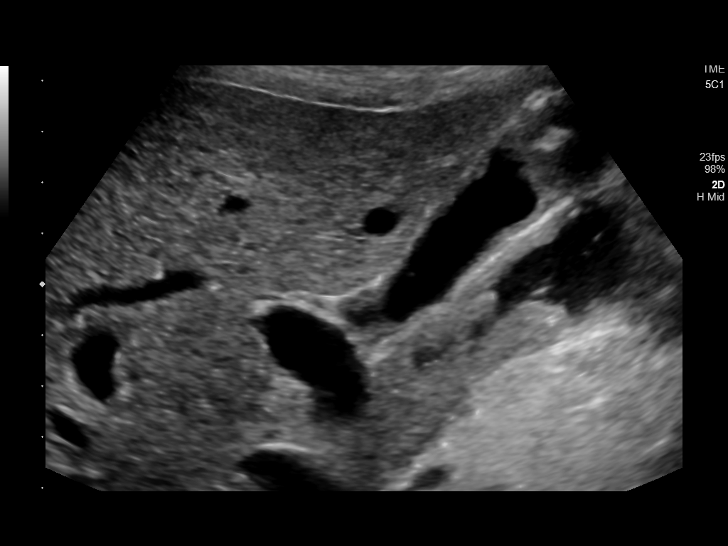
[im 22/65]
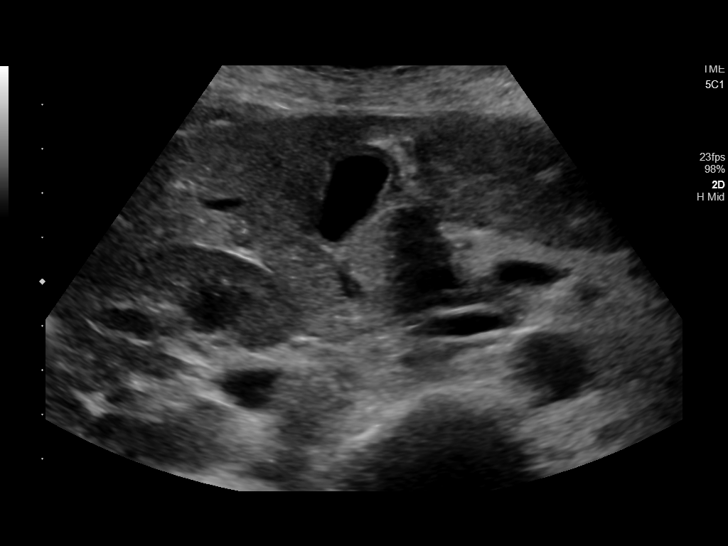
[im 25/65]
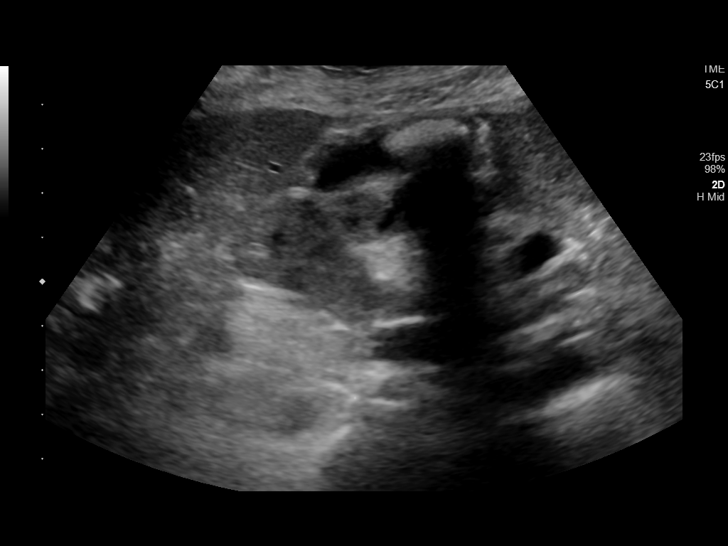
[im 30/65]
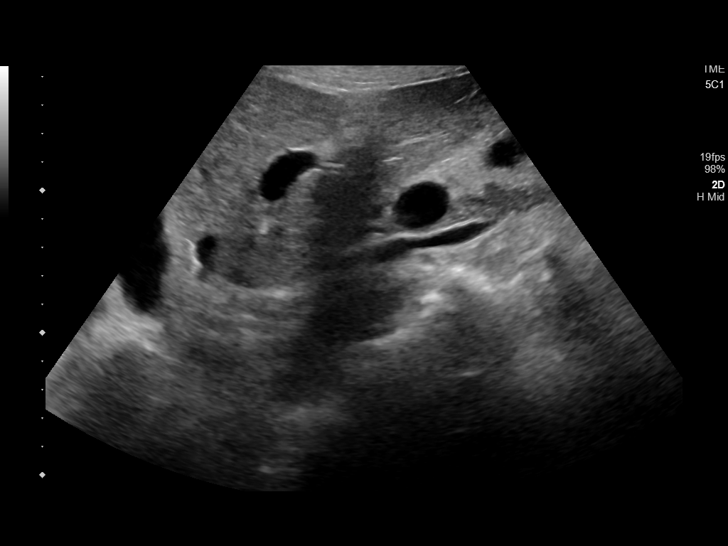
[im 35/65]
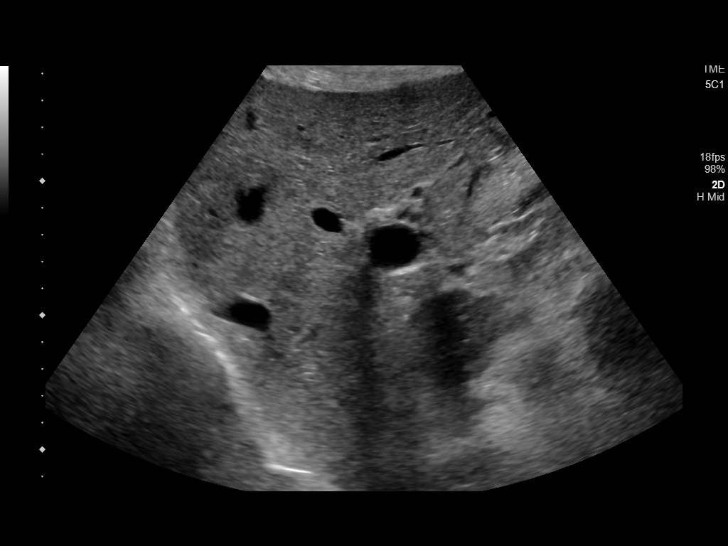
[im 41/65]
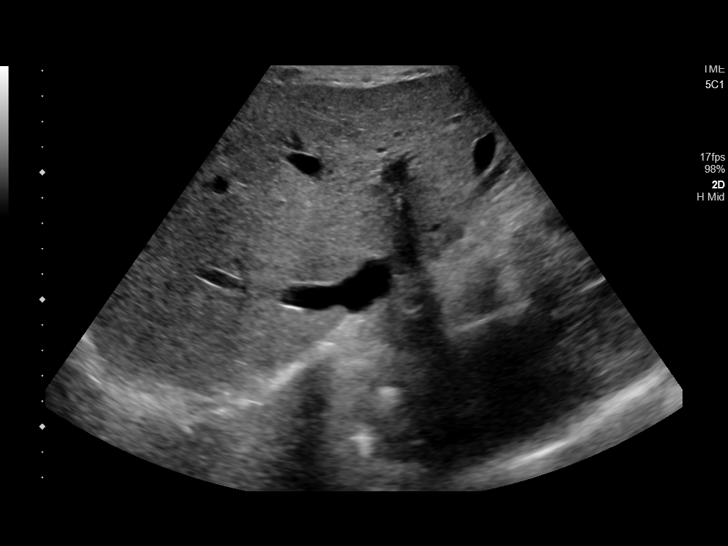
[im 43/65]
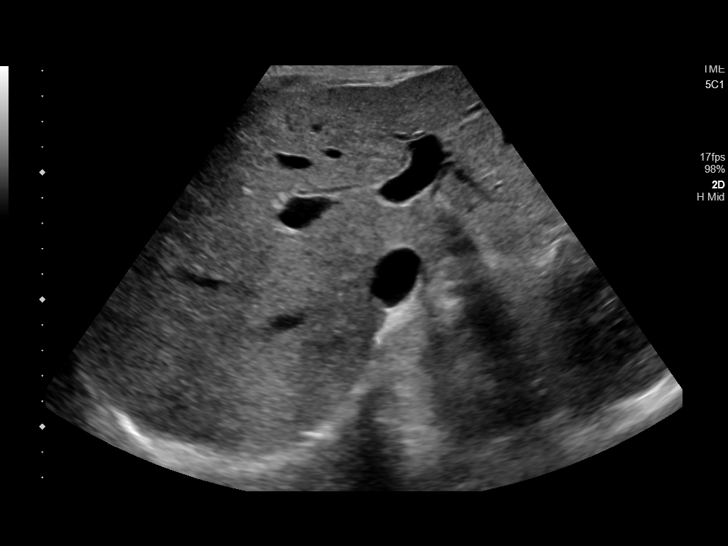
[im 49/65]
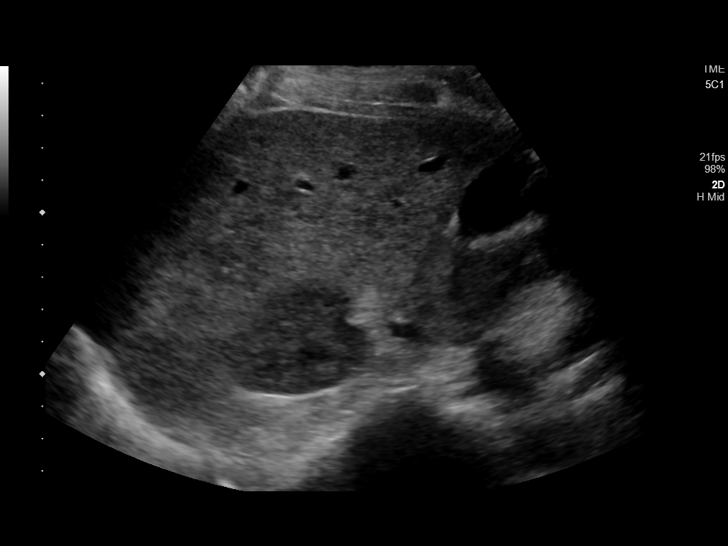
[im 54/65]
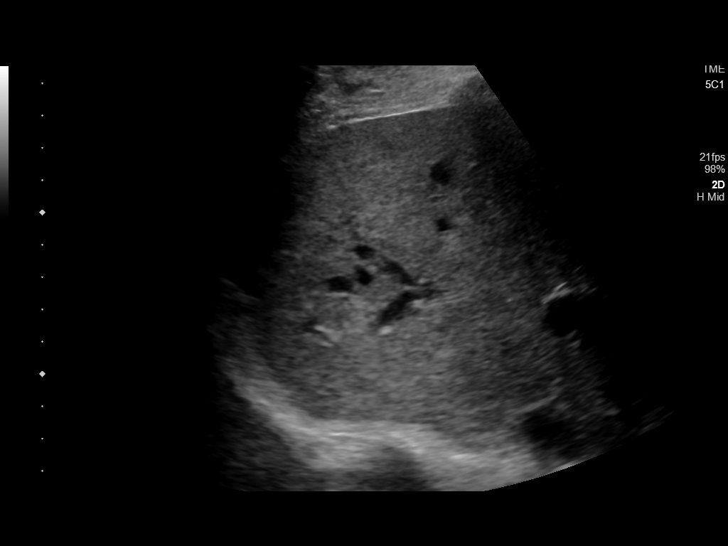
[im 59/65]
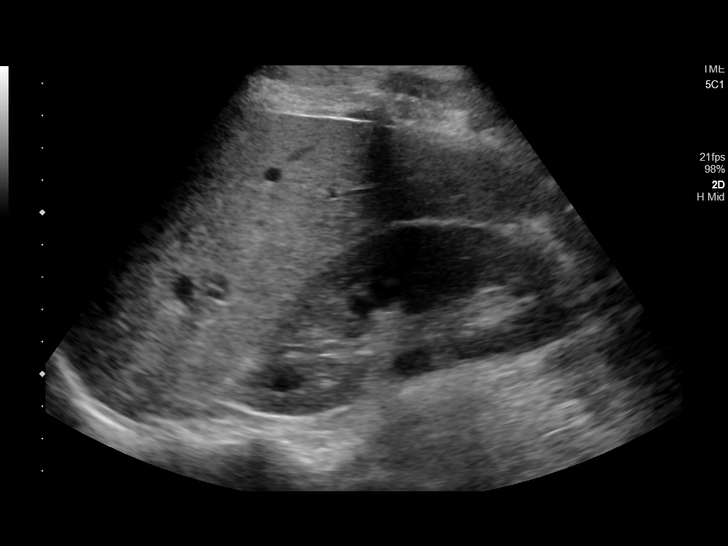
[im 65/65]
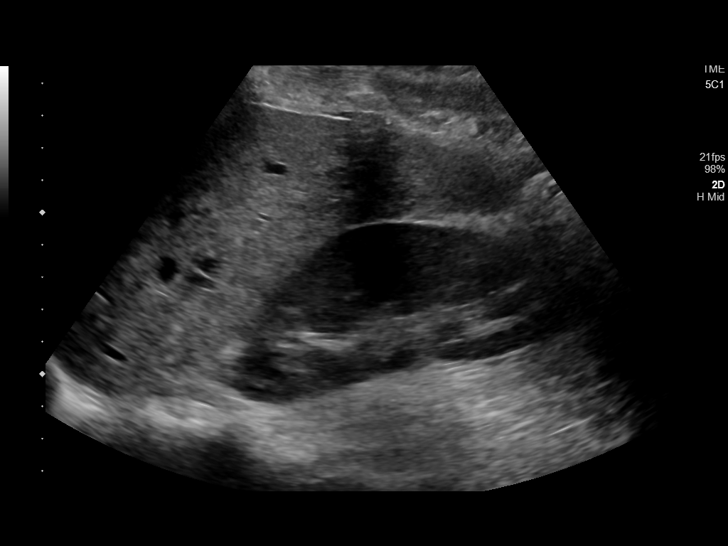

[14 of 25 positions shown; findings below may reference images not displayed]

FINDINGS: Gallbladder:

Gallbladder is well distended with evidence of gallstone within. No
wall thickening or pericholecystic fluid is noted. Phrygian cap is
seen with a large stone.

Common bile duct:

Diameter: 5 mm

Liver:

No focal lesion identified. Within normal limits in parenchymal
echogenicity. Portal vein is patent on color Doppler imaging with
normal direction of blood flow towards the liver.

Other: None.
IMPRESSION: Cholelithiasis without complicating factors. A Phrygian cap is seen
with a stone noted within.

## 2020-10-19 IMAGING — CT CT CHEST W/O CM
2 of 3 series · 15 of 36 positions shown, 18 images · non-contrast
Comparison: 01/01/2019

CLINICAL DATA: Weakness and fall with history of abdominal aortic
aneurysm

EXAM:
CT CHEST WITHOUT CONTRAST
TECHNIQUE: Multidetector CT imaging of the chest was performed following the
standard protocol without IV contrast.

[Series 2: thorax · axial · 0.64mm/px · z∈[-741,-497]mm · 12 of 144 slices shown, 15 images]
[im 11/144  mediastinal]
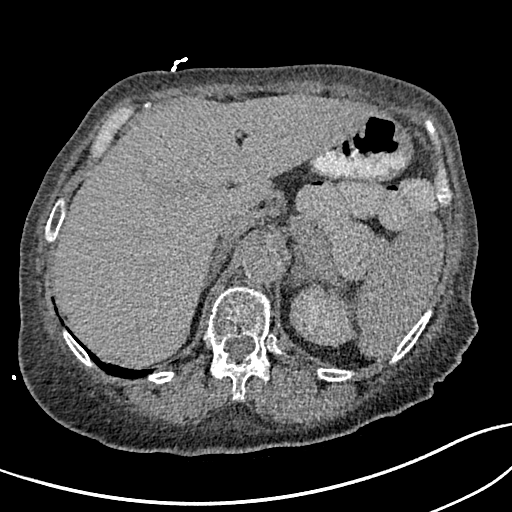
[im 11/144  lung]
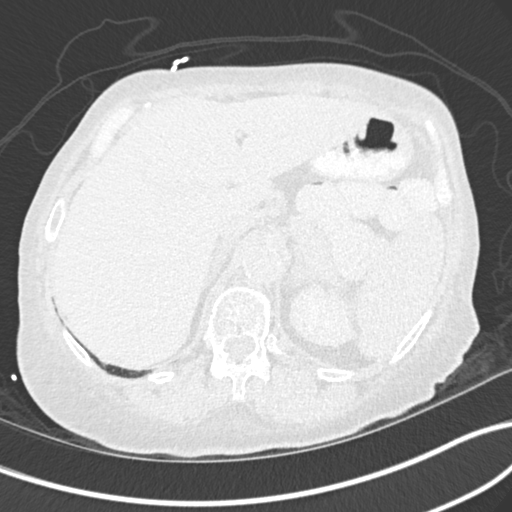
[im 22/144  lung]
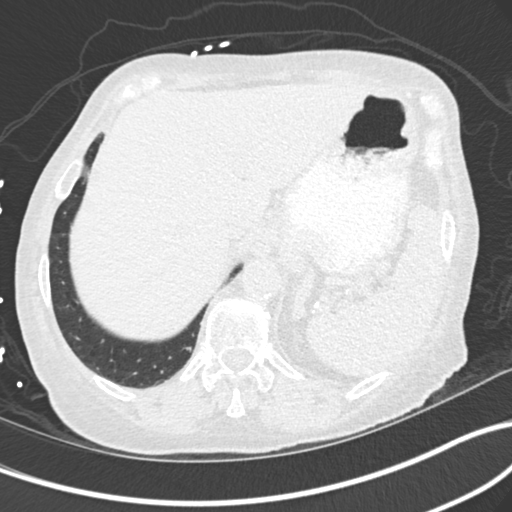
[im 32/144  lung]
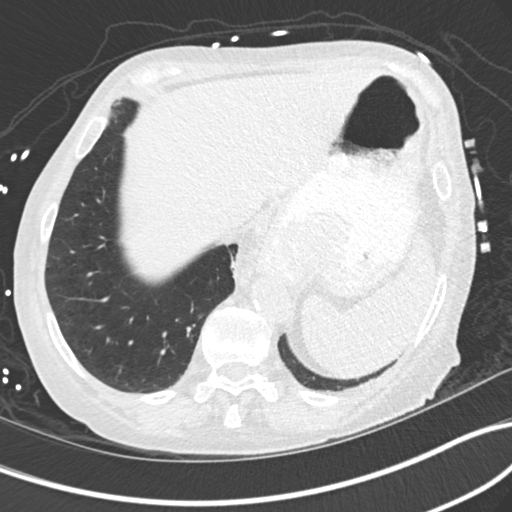
[im 43/144  lung]
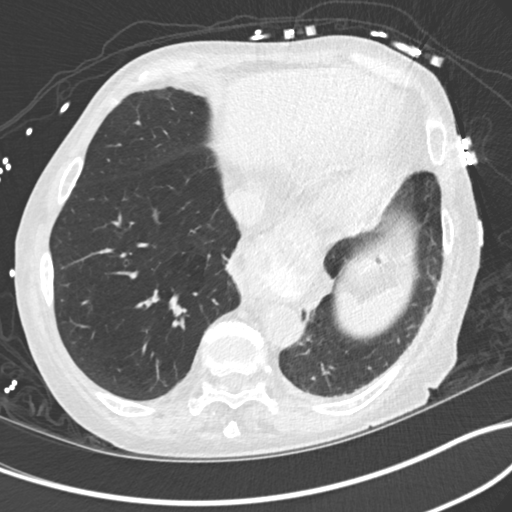
[im 53/144  mediastinal]
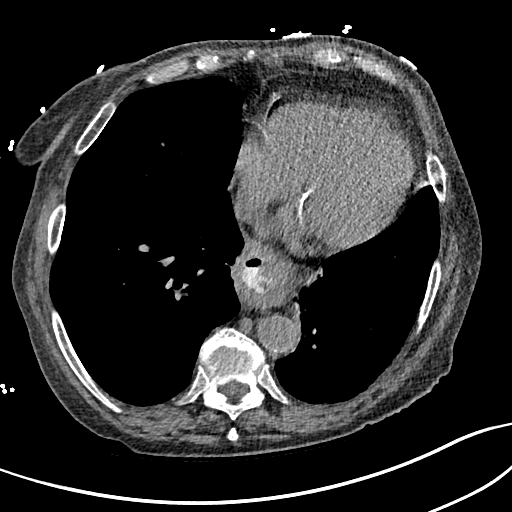
[im 53/144  lung]
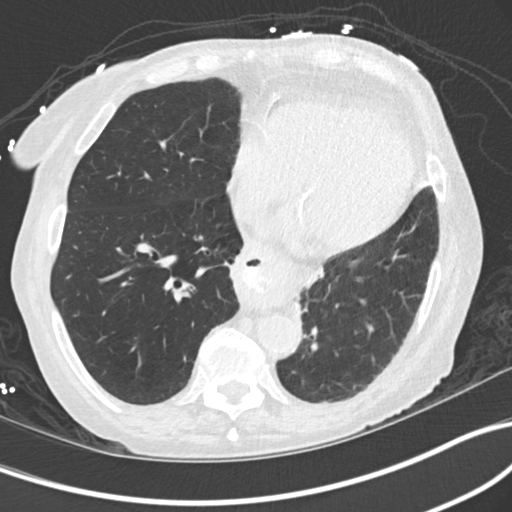
[im 64/144  lung]
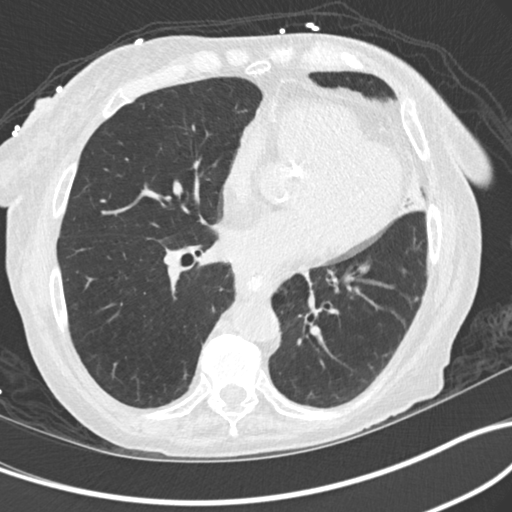
[im 80/144  lung]
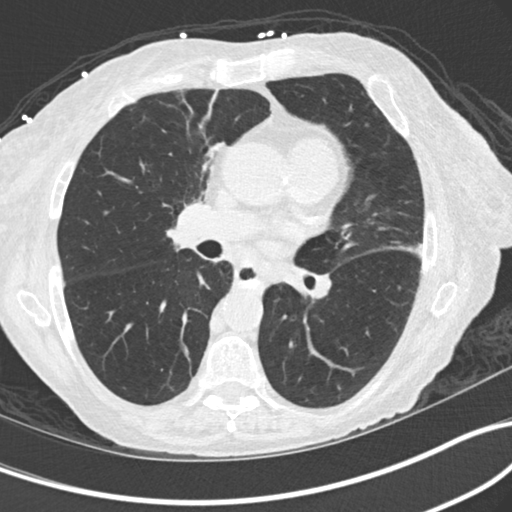
[im 91/144  lung]
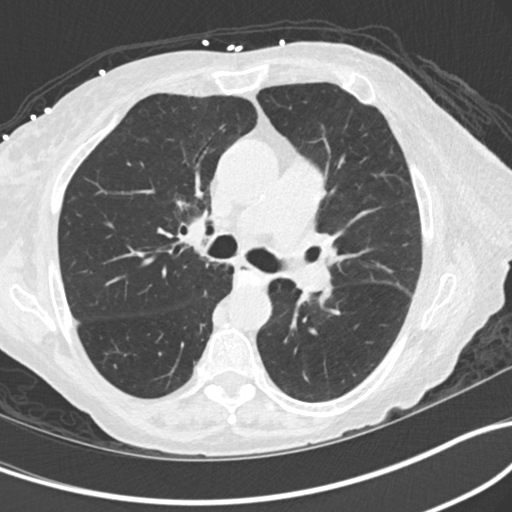
[im 101/144  mediastinal]
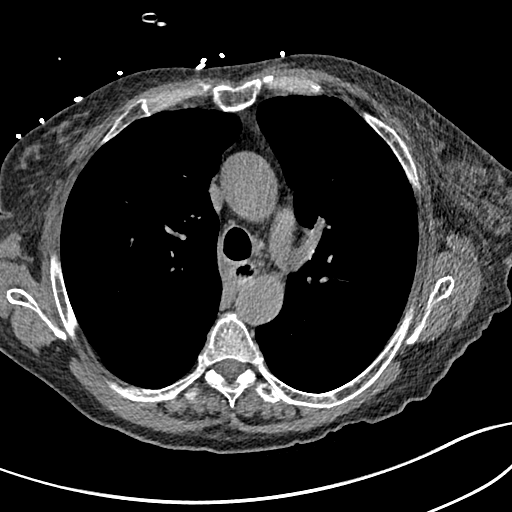
[im 101/144  lung]
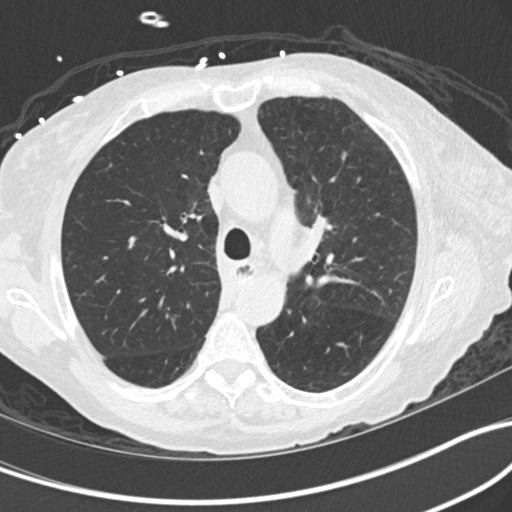
[im 112/144  lung]
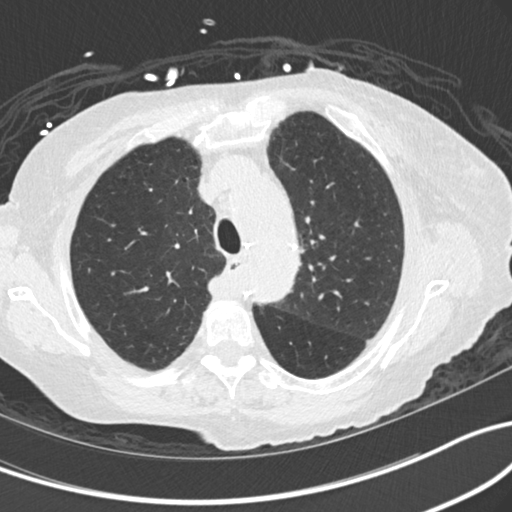
[im 122/144  lung]
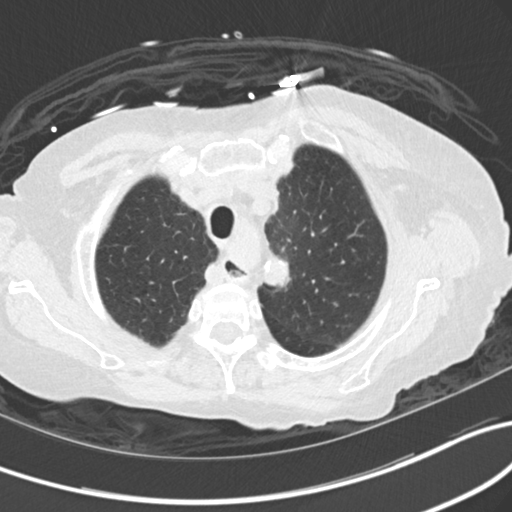
[im 133/144  lung]
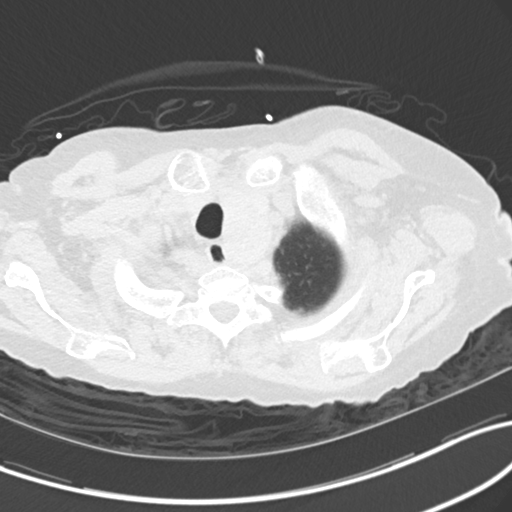

[Series 5: coronal · coronal · 0.59mm/px · 3 of 137 slices shown]
[im 28/137  lung]
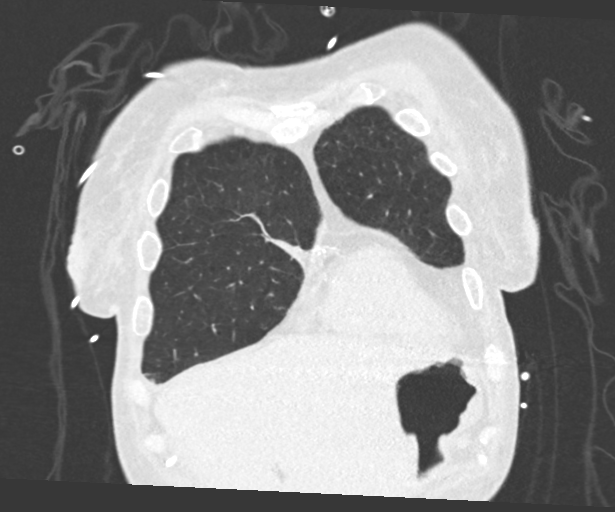
[im 55/137  lung]
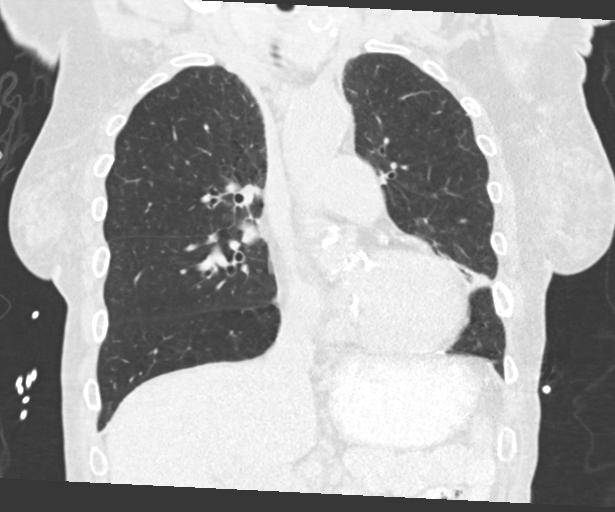
[im 82/137  lung]
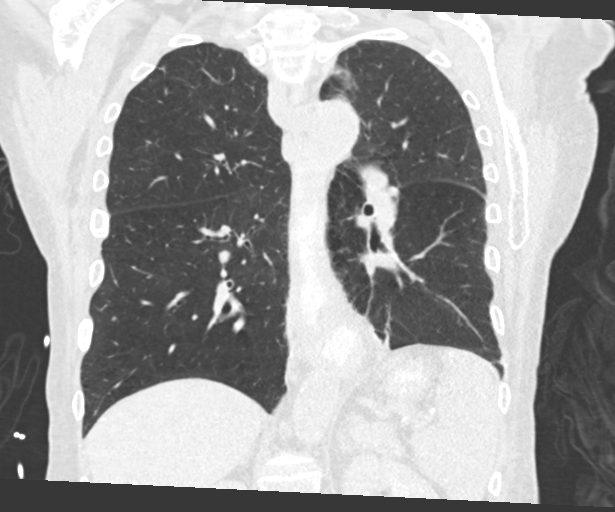

[15 of 36 positions shown; findings below may reference images not displayed]

FINDINGS: Cardiovascular: Somewhat limited due to lack of IV contrast.
Atherosclerotic calcifications of the thoracic aorta are noted.
Stable dilatation of the ascending aorta is noted to 4.2 cm.
Coronary calcifications are seen. No cardiac enlargement is noted.

Mediastinum/Nodes: Thoracic inlet demonstrates a large multinodular
goiter with extension into the superior mediastinum from the left
lobe stable from the prior exam. Chronic hiatal hernia is seen. No
hilar or mediastinal adenopathy is noted.

Lungs/Pleura: The lungs are well aerated bilaterally. No focal
infiltrate or sizable effusion is seen. No parenchymal nodules are
noted. Chronic scarring in the lingula is seen.

Upper Abdomen: Recently reported on CT of the abdomen and pelvis.

Musculoskeletal: Degenerative changes of the thoracic spine are
seen. Old rib fractures are noted anteriorly stable from the prior
exam. Stable thoracic kyphosis is noted. No acute compression
deformity is seen.
IMPRESSION: Stable dilatation of the ascending aorta to 4.2 cm. Recommend annual
imaging followup by CTA or MRA. This recommendation follows 7090
ACCF/AHA/AATS/ACR/ASA/SCA/GAUNA/OURARI/MARKIN/HIDROGO Guidelines for the
Diagnosis and Management of Patients with Thoracic Aortic Disease.
Circulation. 7090; 121: E266-e369. Aortic aneurysm NOS (XD3GU-O7U.O)

Large multinodular goiter stable from the prior exam.

Hiatal hernia stable from the previous exam.

Aortic Atherosclerosis (XD3GU-HKU.U).

## 2020-10-20 ENCOUNTER — Other Ambulatory Visit: Payer: Self-pay | Admitting: Family Medicine

## 2020-10-20 DIAGNOSIS — Z1231 Encounter for screening mammogram for malignant neoplasm of breast: Secondary | ICD-10-CM

## 2020-10-20 DIAGNOSIS — M810 Age-related osteoporosis without current pathological fracture: Secondary | ICD-10-CM

## 2021-02-11 ENCOUNTER — Other Ambulatory Visit: Payer: Self-pay | Admitting: *Deleted

## 2021-02-11 DIAGNOSIS — Z87891 Personal history of nicotine dependence: Secondary | ICD-10-CM

## 2021-02-25 ENCOUNTER — Ambulatory Visit
Admission: RE | Admit: 2021-02-25 | Discharge: 2021-02-25 | Disposition: A | Discharge status: Home / Self Care | Payer: Medicare HMO | Source: Ambulatory Visit | Attending: Acute Care | Admitting: Acute Care

## 2021-02-25 ENCOUNTER — Other Ambulatory Visit: Payer: Self-pay

## 2021-02-25 DIAGNOSIS — Z87891 Personal history of nicotine dependence: Secondary | ICD-10-CM | POA: Insufficient documentation

## 2021-03-01 ENCOUNTER — Telehealth: Payer: Self-pay | Admitting: Acute Care

## 2021-03-01 NOTE — Telephone Encounter (Signed)
Received call report from [facility] with [place] radiology on CT low dose.   Sarah, please advise. Thanks

## 2021-03-01 NOTE — Telephone Encounter (Signed)
Noted.  Will close encounter.  

## 2021-03-03 ENCOUNTER — Telehealth: Payer: Self-pay | Admitting: Acute Care

## 2021-03-03 NOTE — Telephone Encounter (Signed)
I have attempted to call the patient with the results of her low dose CT Chest. There was no answer. I have left a HIPPA compliant message with the office phone number and requested the patient call for her results. If we don't hear from her we will call again tomorrow.

## 2021-03-03 NOTE — Telephone Encounter (Signed)
Pt returned call to Eric Form, NP regarding CT results.

## 2021-03-04 ENCOUNTER — Telehealth: Payer: Self-pay | Admitting: Acute Care

## 2021-03-04 DIAGNOSIS — R911 Solitary pulmonary nodule: Secondary | ICD-10-CM

## 2021-03-04 DIAGNOSIS — Z87891 Personal history of nicotine dependence: Secondary | ICD-10-CM

## 2021-03-04 NOTE — Telephone Encounter (Signed)
I have called the patient with the results of her low-dose CT.  Her scan was read as a lung RADS 4 with the caveat by radiology that there were numerous new indistinct solid pulmonary nodules scattered throughout both lungs, the largest of which is 11.7 mm.  They feel that these are potentially inflammatory.  When I called the patient she stated that she has been sick and is slowly getting better.  Per the radiology recommendation we will do a 75-month low-dose CT follow-up to reevaluate the areas of concern.  Patient is in agreement with the following plan. Langley Gauss, please place order for 72-month follow-up low-dose CT, fax results to PCP and let them know plan is for a 19-month follow-up. Thanks so much

## 2021-03-04 NOTE — Addendum Note (Signed)
Addended by: Joella Prince on: 03/04/2021 03:24 PM   Modules accepted: Orders

## 2021-03-04 NOTE — Telephone Encounter (Signed)
Agree with recommendation.  She also has a HUGE hiatal hernia and I suspect she may be chronically silently aspirating so the nodules may be aspiration bronchiolitis.  We will see what the 73-month follow-up shows.  Happy to see her if necessary.  It appears that she was previously Dr. Mathis Fare patient.

## 2021-03-04 NOTE — Telephone Encounter (Signed)
Order placed for 3 month CT and results and plan faxed to PCP

## 2021-03-23 ENCOUNTER — Other Ambulatory Visit: Payer: Self-pay | Admitting: *Deleted

## 2021-03-23 DIAGNOSIS — D751 Secondary polycythemia: Secondary | ICD-10-CM

## 2021-03-23 DIAGNOSIS — D500 Iron deficiency anemia secondary to blood loss (chronic): Secondary | ICD-10-CM

## 2021-03-30 ENCOUNTER — Encounter: Payer: Self-pay | Admitting: Internal Medicine

## 2021-03-30 ENCOUNTER — Inpatient Hospital Stay: Payer: Medicare HMO | Attending: Internal Medicine

## 2021-03-30 ENCOUNTER — Encounter: Payer: Self-pay | Admitting: Hematology and Oncology

## 2021-03-30 ENCOUNTER — Other Ambulatory Visit: Payer: Self-pay

## 2021-03-30 ENCOUNTER — Inpatient Hospital Stay (HOSPITAL_BASED_OUTPATIENT_CLINIC_OR_DEPARTMENT_OTHER): Payer: Medicare HMO | Admitting: Internal Medicine

## 2021-03-30 DIAGNOSIS — M549 Dorsalgia, unspecified: Secondary | ICD-10-CM | POA: Insufficient documentation

## 2021-03-30 DIAGNOSIS — N83202 Unspecified ovarian cyst, left side: Secondary | ICD-10-CM | POA: Insufficient documentation

## 2021-03-30 DIAGNOSIS — Z87891 Personal history of nicotine dependence: Secondary | ICD-10-CM | POA: Insufficient documentation

## 2021-03-30 DIAGNOSIS — G8929 Other chronic pain: Secondary | ICD-10-CM | POA: Diagnosis not present

## 2021-03-30 DIAGNOSIS — Z79899 Other long term (current) drug therapy: Secondary | ICD-10-CM | POA: Diagnosis not present

## 2021-03-30 DIAGNOSIS — E538 Deficiency of other specified B group vitamins: Secondary | ICD-10-CM | POA: Insufficient documentation

## 2021-03-30 DIAGNOSIS — D509 Iron deficiency anemia, unspecified: Secondary | ICD-10-CM | POA: Insufficient documentation

## 2021-03-30 DIAGNOSIS — D751 Secondary polycythemia: Secondary | ICD-10-CM | POA: Insufficient documentation

## 2021-03-30 DIAGNOSIS — D500 Iron deficiency anemia secondary to blood loss (chronic): Secondary | ICD-10-CM

## 2021-03-30 LAB — COMPREHENSIVE METABOLIC PANEL
ALT: 12 U/L (ref 0–44)
AST: 15 U/L (ref 15–41)
Albumin: 3.7 g/dL (ref 3.5–5.0)
Alkaline Phosphatase: 43 U/L (ref 38–126)
Anion gap: 6 (ref 5–15)
BUN: 16 mg/dL (ref 8–23)
CO2: 27 mmol/L (ref 22–32)
Calcium: 8.9 mg/dL (ref 8.9–10.3)
Chloride: 106 mmol/L (ref 98–111)
Creatinine, Ser: 0.73 mg/dL (ref 0.44–1.00)
GFR, Estimated: >60 mL/min (ref >60)
Glucose, Bld: 97 mg/dL (ref 70–99)
Potassium: 4.3 mmol/L (ref 3.5–5.1)
Sodium: 139 mmol/L (ref 135–145)
Total Bilirubin: 0.1 mg/dL — ABNORMAL LOW (ref 0.3–1.2)
Total Protein: 7.1 g/dL (ref 6.5–8.1)

## 2021-03-30 LAB — CBC WITH DIFFERENTIAL/PLATELET
Abs Immature Granulocytes: 0.01 10*3/uL (ref 0.00–0.07)
Basophils Absolute: 0.1 10*3/uL (ref 0.0–0.1)
Basophils Relative: 1 %
Eosinophils Absolute: 0.1 10*3/uL (ref 0.0–0.5)
Eosinophils Relative: 2 %
HCT: 47.5 % — ABNORMAL HIGH (ref 36.0–46.0)
Hemoglobin: 14.9 g/dL (ref 12.0–15.0)
Immature Granulocytes: 0 %
Lymphocytes Relative: 22 %
Lymphs Abs: 1.6 10*3/uL (ref 0.7–4.0)
MCH: 28.9 pg (ref 26.0–34.0)
MCHC: 31.4 g/dL (ref 30.0–36.0)
MCV: 92.1 fL (ref 80.0–100.0)
Monocytes Absolute: 0.5 10*3/uL (ref 0.1–1.0)
Monocytes Relative: 7 %
Neutro Abs: 5 10*3/uL (ref 1.7–7.7)
Neutrophils Relative %: 68 %
Platelets: 205 10*3/uL (ref 150–400)
RBC: 5.16 MIL/uL — ABNORMAL HIGH (ref 3.87–5.11)
RDW: 14.4 % (ref 11.5–15.5)
WBC: 7.2 10*3/uL (ref 4.0–10.5)
nRBC: 0 % (ref 0.0–0.2)

## 2021-03-30 LAB — IRON AND TIBC
Iron: 69 ug/dL (ref 28–170)
Saturation Ratios: 17 % (ref 10.4–31.8)
TIBC: 409 ug/dL (ref 250–450)
UIBC: 340 ug/dL

## 2021-03-30 LAB — FERRITIN: Ferritin: 12 ng/mL (ref 11–307)

## 2021-03-30 LAB — VITAMIN B12: Vitamin B-12: 449 pg/mL (ref 180–914)

## 2021-03-30 NOTE — Assessment & Plan Note (Addendum)
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  # History of iron deficient anemia/GI bleed [202 EGD]-hemoglobin stable  # B12 deficiency: continue B12 supplementation  # History of carcinoid tumor: Chromogranin was 926.7 (high) on 02/13/2019; Unclear if elevation due to recurrent carcinoid, chronic atrophic gastritis or PPI.              #Thyroid nodules and low TS:  Follow-up thyroid ultrasound in 04/2022; order next visit  # Left ovarian cyst- Patient is asymptomatic; continue surveillance [Dr.jackson].  # Smoker/lung nodules continue surveillance low-dose JAN 2023-  CT-  Lung-RADS 4B, suspicious; Numerous new indistinct solid pulmonary nodules scattered throughout  both lungs, largest 11.7 mm in volume derived mean diameter in the right lower lobe, potentially inflammatory.  Awaiting repeat CT in April 2023. Currently smoking 3 cig/day.   # DISPOSITION; # follow up in 6 months- MD labs- cbc/cmp;ionr studies/ferritin/B12; chromogranin A levels-- Dr.B

## 2021-03-30 NOTE — Progress Notes (Signed)
[place] OFFICE PROGRESS NOTE  Patient Care Team: [place] as PCP - General Lucilla Lame, MD as Consulting Physician (Gastroenterology) Lequita Asal, MD (Inactive) as Referring Physician (Hematology and Oncology)   Cancer Staging  No matching staging information was found for the patient.   Oncology History   No history exists.      INTERVAL HISTORY: walks with a cane; alone   Alicia Holder 71 y.o.  female pleasant patient above history of erythrocytosis/active smoker is here for follow-up.  Patient had recent lung cancer screening-abnormal awaiting follow-up imaging.  She stated she is cutting down on smoking.c no further falls.  She walks with a cane. Chronic shortness of breath chronic fatigue chronic back pain.  Review of Systems  Constitutional:  Positive for malaise/fatigue. Negative for chills, diaphoresis, fever and weight loss.  HENT:  Negative for nosebleeds and sore throat.   Eyes:  Negative for double vision.  Respiratory:  Positive for shortness of breath. Negative for cough, hemoptysis, sputum production and wheezing.   Cardiovascular:  Negative for chest pain, palpitations, orthopnea and leg swelling.  Gastrointestinal:  Negative for abdominal pain, blood in stool, constipation, diarrhea, heartburn, melena, nausea and vomiting.  Genitourinary:  Negative for dysuria, frequency and urgency.  Musculoskeletal:  Positive for back pain and joint pain.  Skin: Negative.  Negative for itching and rash.  Neurological:  Negative for dizziness, tingling, focal weakness, weakness and headaches.  Endo/Heme/Allergies:  Does not bruise/bleed easily.  Psychiatric/Behavioral:  Negative for depression. The patient is not nervous/anxious and does not have insomnia.      PAST MEDICAL HISTORY :  Past Medical History:  Diagnosis Date   AAA (abdominal aortic aneurysm)    Arthritis    knees, elbows   Asthma    Colon cancer (HCC)     colon   COPD (chronic obstructive pulmonary disease) (HCC)    GERD (gastroesophageal reflux disease)    Hypertension    Sleep apnea     PAST SURGICAL HISTORY :   Past Surgical History:  Procedure Laterality Date   ABDOMINAL HYSTERECTOMY     APPENDECTOMY     CATARACT EXTRACTION W/PHACO Left 05/28/2019   Procedure: CATARACT EXTRACTION PHACO AND INTRAOCULAR LENS PLACEMENT ([place]) LEFT VISION BLUE 31.19 02:18.2;  Surgeon: Birder Robson, MD;  Location: [place];  Service: Ophthalmology;  Laterality: Left;   CATARACT EXTRACTION W/PHACO Right 06/18/2019   Procedure: CATARACT EXTRACTION PHACO AND INTRAOCULAR LENS PLACEMENT ([facility]) RIGHT 34.59  02:31.7;  Surgeon: Birder Robson, MD;  Location: [place];  Service: Ophthalmology;  Laterality: Right;  sleep apnea   ESOPHAGOGASTRODUODENOSCOPY N/A 01/12/2019   Procedure: ESOPHAGOGASTRODUODENOSCOPY (EGD);  Surgeon: Virgel Manifold, MD;  Location: [facility] ENDOSCOPY;  Service: Endoscopy;  Laterality: N/A;   ESOPHAGOGASTRODUODENOSCOPY (EGD) WITH PROPOFOL N/A 01/14/2019   Procedure: ESOPHAGOGASTRODUODENOSCOPY (EGD) WITH PROPOFOL;  Surgeon: [name], MD;  Location: [place];  Service: Gastroenterology;  Laterality: N/A;   HEMOSTASIS CLIP PLACEMENT  01/14/2019   Procedure: HEMOSTASIS CLIP PLACEMENT;  Surgeon: [name], MD;  Location: [facility] ENDOSCOPY;  Service: Gastroenterology;;   KNEE SURGERY Right    fracture repair   TONSILLECTOMY     TUBAL LIGATION     TUMOR EXCISION     colon    FAMILY HISTORY :   Family History  Problem Relation Age of Onset   CAD Mother    CAD Father     SOCIAL HISTORY:   Social History   Tobacco Use  Smoking status: Former    Packs/day: 1.00    Years: 41.00    Pack years: 41.00    Types: Cigarettes    Quit date: 01/01/2019    Years since quitting: 2.2   Smokeless tobacco: Never  Vaping Use   Vaping Use: Never used  Substance Use Topics   Alcohol use: Yes    Comment: rare-  couple times per year   Drug use: Never    ALLERGIES:  is allergic to aspirin.  MEDICATIONS:  Current Outpatient Medications  Medication Sig Dispense Refill   acetaminophen (TYLENOL) 500 MG tablet Take 1,000 mg by mouth every 6 (six) hours as needed for mild pain.     albuterol (VENTOLIN HFA) 108 (90 Base) MCG/ACT inhaler Inhale 1-2 puffs into the lungs every 4 (four) hours as needed for wheezing or shortness of breath.      alendronate (FOSAMAX) 70 MG tablet Take 70 mg by mouth once a week.     buPROPion (WELLBUTRIN XL) 150 MG 24 hr tablet Take 150 mg by mouth daily.      docusate sodium (COLACE) 100 MG capsule Take 100 mg by mouth daily.     ipratropium-albuterol (DUONEB) 0.5-2.5 (3) MG/3ML SOLN Take 3 mLs by nebulization every 4 (four) hours as needed. 360 mL 0   lisinopril (ZESTRIL) 20 MG tablet Take 20 mg by mouth daily.      montelukast (SINGULAIR) 10 MG tablet Take 10 mg by mouth at bedtime.     omeprazole (PRILOSEC) 20 MG capsule Take 20 mg by mouth 2 (two) times daily.     oxyCODONE (OXY IR/ROXICODONE) 5 MG immediate release tablet Take 5 mg by mouth 3 (three) times daily as needed for moderate pain or breakthrough pain.      pantoprazole (PROTONIX) 40 MG tablet TAKE 1 TABLET BY MOUTH EVERY DAY 90 tablet 0   sertraline (ZOLOFT) 100 MG tablet Take 100 mg by mouth daily.     simvastatin (ZOCOR) 20 MG tablet Take 20 mg by mouth daily.     TRELEGY ELLIPTA 100-62.5-25 MCG/INH AEPB Inhale 1 puff into the lungs daily.      No current facility-administered medications for this visit.    PHYSICAL EXAMINATION: ECOG PERFORMANCE STATUS: 0 - Asymptomatic  BP 118/87 (BP Location: Left Arm, Patient Position: Sitting, Cuff Size: Normal)    Pulse 81    Temp (!) 95.4 F (35.2 C) (Tympanic)    Ht 5\' 4"  (1.626 m)    Wt 159 lb (72.1 kg)    SpO2 96%    BMI 27.29 kg/m   Filed Weights   03/30/21 1312  Weight: 159 lb (72.1 kg)    Physical Exam Vitals and nursing note reviewed.  HENT:      Head: Normocephalic and atraumatic.     Mouth/Throat:     Pharynx: Oropharynx is clear.  Eyes:     Extraocular Movements: Extraocular movements intact.     Pupils: Pupils are equal, round, and reactive to light.  Cardiovascular:     Rate and Rhythm: Normal rate and regular rhythm.  Pulmonary:     Comments: Decreased breath sounds bilaterally.  Abdominal:     Palpations: Abdomen is soft.  Musculoskeletal:        General: Normal range of motion.     Cervical back: Normal range of motion.  Skin:    General: Skin is warm.  Neurological:     General: No focal deficit present.     Mental Status: She  is alert and oriented to person, place, and time.  Psychiatric:        Behavior: Behavior normal.        Judgment: Judgment normal.       LABORATORY DATA:  I have reviewed the data as listed    Component Value Date/Time   NA 139 03/30/2021 1255   K 4.3 03/30/2021 1255   CL 106 03/30/2021 1255   CO2 27 03/30/2021 1255   GLUCOSE 97 03/30/2021 1255   BUN 16 03/30/2021 1255   CREATININE 0.73 03/30/2021 1255   CALCIUM 8.9 03/30/2021 1255   PROT 7.1 03/30/2021 1255   ALBUMIN 3.7 03/30/2021 1255   AST 15 03/30/2021 1255   ALT 12 03/30/2021 1255   ALKPHOS 43 03/30/2021 1255   BILITOT 0.1 (L) 03/30/2021 1255   GFRNONAA >60 03/30/2021 1255   GFRAA >60 02/21/2019 0944    No results found for: SPEP, UPEP  Lab Results  Component Value Date   WBC 7.2 03/30/2021   NEUTROABS 5.0 03/30/2021   HGB 14.9 03/30/2021   HCT 47.5 (H) 03/30/2021   MCV 92.1 03/30/2021   PLT 205 03/30/2021      Chemistry      Component Value Date/Time   NA 139 03/30/2021 1255   K 4.3 03/30/2021 1255   CL 106 03/30/2021 1255   CO2 27 03/30/2021 1255   BUN 16 03/30/2021 1255   CREATININE 0.73 03/30/2021 1255      Component Value Date/Time   CALCIUM 8.9 03/30/2021 1255   ALKPHOS 43 03/30/2021 1255   AST 15 03/30/2021 1255   ALT 12 03/30/2021 1255   BILITOT 0.1 (L) 03/30/2021 1255        RADIOGRAPHIC STUDIES: I have personally reviewed the radiological images as listed and agreed with the findings in the report. No results found.   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  # History of iron deficient anemia/GI bleed [202 EGD]-hemoglobin stable  #  B12 deficiency: continue B12 supplementation  # History of carcinoid tumor: Chromogranin was 926.7 (high) on 02/13/2019; Unclear if elevation due to recurrent carcinoid, chronic atrophic gastritis or PPI.              #   Thyroid nodules and low TS:  Follow-up thyroid ultrasound in 04/2022; order next visit  # Left ovarian cyst- Patient is asymptomatic; continue surveillance [Dr.jackson].  # Smoker/lung nodules continue surveillance low-dose JAN 2023-  CT-  Lung-RADS 4B, suspicious; Numerous new indistinct solid pulmonary nodules scattered throughout  both lungs, largest 11.7 mm in volume derived mean diameter in the right lower lobe, potentially inflammatory.  Awaiting repeat CT in April 2023. Currently smoking 3 cig/day.   # DISPOSITION; # follow up in 6 months- MD labs- cbc/cmp;ionr studies/ferritin/B12; chromogranin A levels-- Dr.B   Orders Placed This Encounter  Procedures   CBC with Differential/Platelet    Standing Status:   Future    Standing Expiration Date:   03/30/2022   Comprehensive metabolic panel    Standing Status:   Future    Standing Expiration Date:   03/30/2022   Iron and TIBC    Standing Status:   Future    Standing Expiration Date:   03/30/2022   Ferritin    Standing Status:   Future    Standing Expiration Date:   03/30/2022   Vitamin B12    Standing Status:   Future    Standing Expiration  Date:   03/30/2022   Chromogranin A    Standing Status:   Future    Standing Expiration Date:   03/30/2022    All questions were answered. The patient knows to call the clinic with any problems, questions or  concerns.      Cammie Sickle, MD 03/30/2021 2:30 PM

## 2021-04-06 ENCOUNTER — Encounter: Payer: Medicare HMO | Admitting: Family Medicine

## 2021-04-06 ENCOUNTER — Other Ambulatory Visit: Payer: Self-pay

## 2021-04-15 ENCOUNTER — Encounter: Payer: Self-pay | Admitting: Hematology and Oncology

## 2021-04-20 ENCOUNTER — Other Ambulatory Visit: Payer: Self-pay

## 2021-04-20 DIAGNOSIS — N83202 Unspecified ovarian cyst, left side: Secondary | ICD-10-CM

## 2021-04-20 NOTE — Progress Notes (Signed)
ERROR

## 2021-04-22 ENCOUNTER — Other Ambulatory Visit: Payer: Self-pay

## 2021-04-22 ENCOUNTER — Ambulatory Visit (INDEPENDENT_AMBULATORY_CARE_PROVIDER_SITE_OTHER): Payer: Medicare PPO

## 2021-04-22 DIAGNOSIS — N83202 Unspecified ovarian cyst, left side: Secondary | ICD-10-CM | POA: Diagnosis not present

## 2021-05-19 ENCOUNTER — Other Ambulatory Visit: Payer: Self-pay | Admitting: Family Medicine

## 2021-05-19 DIAGNOSIS — Z1231 Encounter for screening mammogram for malignant neoplasm of breast: Secondary | ICD-10-CM

## 2021-05-20 ENCOUNTER — Encounter: Payer: Self-pay | Admitting: Hematology and Oncology

## 2021-05-27 ENCOUNTER — Ambulatory Visit
Admission: RE | Admit: 2021-05-27 | Discharge: 2021-05-27 | Disposition: A | Discharge status: Home / Self Care | Payer: Medicare PPO | Source: Ambulatory Visit | Attending: Acute Care | Admitting: Acute Care

## 2021-05-27 DIAGNOSIS — R911 Solitary pulmonary nodule: Secondary | ICD-10-CM | POA: Diagnosis present

## 2021-05-27 DIAGNOSIS — Z87891 Personal history of nicotine dependence: Secondary | ICD-10-CM | POA: Insufficient documentation

## 2021-05-28 ENCOUNTER — Other Ambulatory Visit: Payer: Self-pay

## 2021-05-28 ENCOUNTER — Telehealth: Payer: Self-pay | Admitting: Acute Care

## 2021-05-28 DIAGNOSIS — Z122 Encounter for screening for malignant neoplasm of respiratory organs: Secondary | ICD-10-CM

## 2021-05-28 DIAGNOSIS — Z87891 Personal history of nicotine dependence: Secondary | ICD-10-CM

## 2021-05-28 NOTE — Telephone Encounter (Signed)
Contacted patient by phone to review results of [facility] CT follow up scan.  Nodules of concern have resolved. No suspicious findings for lung cancer.  Will resume yearly LDCT schedule.  Patient acknowledged understanding and had no questions.  Results faxed to PCP and order for 2024 LDCT placed. ?

## 2021-06-02 ENCOUNTER — Other Ambulatory Visit: Payer: Self-pay | Admitting: Obstetrics and Gynecology

## 2021-06-02 DIAGNOSIS — N83202 Unspecified ovarian cyst, left side: Secondary | ICD-10-CM

## 2021-06-02 NOTE — Progress Notes (Signed)
Case discussed with Dr. Berline Lopes, GYN [place]. ?Upon chart review, patient has had a stable left ovarian cyst since 2020. In light of presence of a second left ovarian cyst, a follow up ultrasound will be scheduled in 6 months. Patient will be contacted with plan of care ?

## 2021-06-03 ENCOUNTER — Telehealth: Payer: Self-pay

## 2021-06-03 NOTE — Telephone Encounter (Signed)
-----   Message from Mora Bellman, MD sent at 06/02/2021 10:15 AM EDT ----- ?Case discussed with Dr. Berline Lopes, GYN [place]. ?Upon chart review, patient has had a stable left ovarian cyst since 2020. Recent ultrasound showed the presence of a second left ovarian cyst, a follow up ultrasound will be scheduled in 6 months to follow up on this new cyst (order placed in Epic). ? ?Please inform patient of plan of care. An appointment should be scheduled for this patient to discuss ultrasound results in 6 months ? ?Thanks ? ?Peggy ? ?

## 2021-06-03 NOTE — Telephone Encounter (Signed)
Patient aware.

## 2021-06-11 NOTE — Telephone Encounter (Signed)
Recall has been added to contact patient in 5 months to scheduled  in 6 months with MD .

## 2021-06-30 ENCOUNTER — Ambulatory Visit: Payer: Self-pay

## 2021-06-30 ENCOUNTER — Other Ambulatory Visit: Payer: Self-pay

## 2021-07-19 ENCOUNTER — Encounter (INDEPENDENT_AMBULATORY_CARE_PROVIDER_SITE_OTHER): Payer: Self-pay | Admitting: Vascular Surgery

## 2021-07-19 ENCOUNTER — Ambulatory Visit (INDEPENDENT_AMBULATORY_CARE_PROVIDER_SITE_OTHER): Payer: Medicare PPO | Admitting: Vascular Surgery

## 2021-07-19 VITALS — BP 132/78 | HR 77 | Ht 64.0 in | Wt 162.0 lb

## 2021-07-19 DIAGNOSIS — J449 Chronic obstructive pulmonary disease, unspecified: Secondary | ICD-10-CM

## 2021-07-19 DIAGNOSIS — I10 Essential (primary) hypertension: Secondary | ICD-10-CM | POA: Diagnosis not present

## 2021-07-19 DIAGNOSIS — E785 Hyperlipidemia, unspecified: Secondary | ICD-10-CM

## 2021-07-19 DIAGNOSIS — I7121 Aneurysm of the ascending aorta, without rupture: Secondary | ICD-10-CM | POA: Diagnosis not present

## 2021-07-19 DIAGNOSIS — R0989 Other specified symptoms and signs involving the circulatory and respiratory systems: Secondary | ICD-10-CM

## 2021-07-19 NOTE — Progress Notes (Unsigned)
MRN : [id number]  Alicia Holder is a 71 y.o. (August 04, 1950) female who presents with chief complaint of check circulation.  History of Present Illness:   The patient presents to the office for evaluation of an ascending thoracic aortic aneurysm. The aneurysm was found incidentally by CT scan. Patient denies chest pain or unusual back pain, no other chest or abdominal complaints.  No history of an abrupt onset of a painful toe associated with blue discoloration.     No family history of TAA/AAA.   Patient denies amaurosis fugax or TIA symptoms.  There is no history of claudication or rest pain symptoms of the lower extremities.   The patient denies angina or shortness of breath.  CT scan dated 05/28/2021, shows an ascending TAA that measures 4.2 cm    No outpatient medications have been marked as taking for the 07/19/21 encounter (Appointment) with Delana Meyer, Dolores Lory, MD.    Past Medical History:  Diagnosis Date   AAA (abdominal aortic aneurysm)    Arthritis    knees, elbows   Asthma    Colon cancer ([place])    colon   COPD (chronic obstructive pulmonary disease) ([place])    GERD (gastroesophageal reflux disease)    Hypertension    Sleep apnea     Past Surgical History:  Procedure Laterality Date   ABDOMINAL HYSTERECTOMY     APPENDECTOMY     CATARACT EXTRACTION W/PHACO Left 05/28/2019   Procedure: CATARACT EXTRACTION PHACO AND INTRAOCULAR LENS PLACEMENT ([place]) LEFT VISION BLUE 31.19 02:18.2;  Surgeon: Birder Robson, MD;  Location: [place];  Service: Ophthalmology;  Laterality: Left;   CATARACT EXTRACTION W/PHACO Right 06/18/2019   Procedure: CATARACT EXTRACTION PHACO AND INTRAOCULAR LENS PLACEMENT ([facility]) RIGHT 34.59  02:31.7;  Surgeon: Birder Robson, MD;  Location: [place];  Service: Ophthalmology;  Laterality: Right;  sleep apnea   ESOPHAGOGASTRODUODENOSCOPY N/A 01/12/2019   Procedure: ESOPHAGOGASTRODUODENOSCOPY (EGD);  Surgeon: Virgel Manifold, MD;  Location: [facility] ENDOSCOPY;  Service: Endoscopy;  Laterality: N/A;   ESOPHAGOGASTRODUODENOSCOPY (EGD) WITH PROPOFOL N/A 01/14/2019   Procedure: ESOPHAGOGASTRODUODENOSCOPY (EGD) WITH PROPOFOL;  Surgeon: [name], MD;  Location: [place];  Service: Gastroenterology;  Laterality: N/A;   HEMOSTASIS CLIP PLACEMENT  01/14/2019   Procedure: HEMOSTASIS CLIP PLACEMENT;  Surgeon: [name], MD;  Location: [facility] ENDOSCOPY;  Service: Gastroenterology;;   KNEE SURGERY Right    fracture repair   TONSILLECTOMY     TUBAL LIGATION     TUMOR EXCISION     colon    Social History Social History   Tobacco Use   Smoking status: Former    Packs/day: 1.00    Years: 41.00    Total pack years: 41.00    Types: Cigarettes    Quit date: 01/01/2019    Years since quitting: 2.5   Smokeless tobacco: Never  Vaping Use   Vaping Use: Never used  Substance Use Topics   Alcohol use: Yes    Comment: rare- couple times per year   Drug use: Never    Family History Family History  Problem Relation Age of Onset   CAD Mother    CAD Father     Allergies  Allergen Reactions   Aspirin     Hx of esophageal bleed     REVIEW OF SYSTEMS (Negative unless checked)  Constitutional: '[]'$ Weight loss  '[]'$ Fever  '[]'$ Chills Cardiac: '[]'$ Chest pain   '[]'$ Chest pressure   '[]'$ Palpitations   '[]'$ Shortness of breath when laying flat   '[]'$   Shortness of breath with exertion. Vascular:  '[x]'$ Pain in legs with walking   '[]'$ Pain in legs at rest  '[]'$ History of DVT   '[]'$ Phlebitis   '[]'$ Swelling in legs   '[]'$ Varicose veins   '[]'$ Non-healing ulcers Pulmonary:   '[]'$ Uses home oxygen   '[]'$ Productive cough   '[]'$ Hemoptysis   '[]'$ Wheeze  '[]'$ COPD   '[]'$ Asthma Neurologic:  '[]'$ Dizziness   '[]'$ Seizures   '[]'$ History of stroke   '[]'$ History of TIA  '[]'$ Aphasia   '[]'$ Vissual changes   '[]'$ Weakness or numbness in arm   '[]'$ Weakness or numbness in leg Musculoskeletal:   '[]'$ Joint swelling   '[]'$ Joint pain   '[]'$ Low back pain Hematologic:  '[]'$ Easy bruising  '[]'$ Easy bleeding    '[]'$ Hypercoagulable state   '[]'$ Anemic Gastrointestinal:  '[]'$ Diarrhea   '[]'$ Vomiting  '[]'$ Gastroesophageal reflux/heartburn   '[]'$ Difficulty swallowing. Genitourinary:  '[]'$ Chronic kidney disease   '[]'$ Difficult urination  '[]'$ Frequent urination   '[]'$ Blood in urine Skin:  '[]'$ Rashes   '[]'$ Ulcers  Psychological:  '[]'$ History of anxiety   '[]'$  History of major depression.  Physical Examination  There were no vitals filed for this visit. There is no height or weight on file to calculate BMI. Gen: WD/WN, NAD Head: [place]/AT, No temporalis wasting.  Ear/Nose/Throat: Hearing grossly intact, nares w/o erythema or drainage Eyes: PER, EOMI, sclera nonicteric.  Neck: Supple, no masses.  No bruit or JVD.  Pulmonary:  Good air movement, no audible wheezing, no use of accessory muscles.  Cardiac: RRR, normal S1, S2, no Murmurs. Vascular:  mild trophic changes, no open wounds Vessel Right Left  Radial Palpable Palpable  PT Not Palpable Not Palpable  DP Not Palpable Not Palpable  Gastrointestinal: soft, non-distended. No guarding/no peritoneal signs.  Musculoskeletal: M/S 5/5 throughout.  No visible deformity.  Neurologic: CN 2-12 intact. Pain and light touch intact in extremities.  Symmetrical.  Speech is fluent. Motor exam as listed above. Psychiatric: Judgment intact, Mood & affect appropriate for pt's clinical situation. Dermatologic: No rashes or ulcers noted.  No changes consistent with cellulitis.   CBC Lab Results  Component Value Date   WBC 7.2 03/30/2021   HGB 14.9 03/30/2021   HCT 47.5 (H) 03/30/2021   MCV 92.1 03/30/2021   PLT 205 03/30/2021    BMET    Component Value Date/Time   NA 139 03/30/2021 1255   K 4.3 03/30/2021 1255   CL 106 03/30/2021 1255   CO2 27 03/30/2021 1255   GLUCOSE 97 03/30/2021 1255   BUN 16 03/30/2021 1255   CREATININE 0.73 03/30/2021 1255   CALCIUM 8.9 03/30/2021 1255   GFRNONAA >60 03/30/2021 1255   GFRAA >60 02/21/2019 0944   CrCl cannot be calculated (Patient's most  recent lab result is older than the maximum 21 days allowed.).  COAG Lab Results  Component Value Date   INR 1.1 01/12/2019   INR 1.1 01/11/2019   INR 1.1 01/01/2019    Radiology No results found.   Assessment/Plan There are no diagnoses linked to this encounter.   Hortencia Pilar, MD  07/19/2021 12:42 PM

## 2021-07-25 ENCOUNTER — Encounter (INDEPENDENT_AMBULATORY_CARE_PROVIDER_SITE_OTHER): Payer: Self-pay | Admitting: Vascular Surgery

## 2021-07-25 DIAGNOSIS — R0989 Other specified symptoms and signs involving the circulatory and respiratory systems: Secondary | ICD-10-CM | POA: Insufficient documentation

## 2021-08-05 ENCOUNTER — Ambulatory Visit
Admission: RE | Admit: 2021-08-05 | Discharge: 2021-08-05 | Disposition: A | Discharge status: Home / Self Care | Payer: Medicare PPO | Source: Ambulatory Visit | Attending: Family Medicine | Admitting: Family Medicine

## 2021-08-05 DIAGNOSIS — Z1231 Encounter for screening mammogram for malignant neoplasm of breast: Secondary | ICD-10-CM | POA: Insufficient documentation

## 2021-08-05 DIAGNOSIS — M810 Age-related osteoporosis without current pathological fracture: Secondary | ICD-10-CM | POA: Insufficient documentation

## 2021-09-28 ENCOUNTER — Inpatient Hospital Stay: Payer: Medicare PPO | Attending: Internal Medicine

## 2021-09-28 ENCOUNTER — Inpatient Hospital Stay (HOSPITAL_BASED_OUTPATIENT_CLINIC_OR_DEPARTMENT_OTHER): Payer: Medicare PPO | Admitting: Internal Medicine

## 2021-09-28 ENCOUNTER — Encounter: Payer: Self-pay | Admitting: Internal Medicine

## 2021-09-28 VITALS — BP 115/81 | HR 90 | Temp 96.8°F | Ht 64.0 in | Wt 153.6 lb

## 2021-09-28 DIAGNOSIS — D509 Iron deficiency anemia, unspecified: Secondary | ICD-10-CM | POA: Insufficient documentation

## 2021-09-28 DIAGNOSIS — D751 Secondary polycythemia: Secondary | ICD-10-CM | POA: Insufficient documentation

## 2021-09-28 DIAGNOSIS — E538 Deficiency of other specified B group vitamins: Secondary | ICD-10-CM | POA: Diagnosis not present

## 2021-09-28 DIAGNOSIS — N83202 Unspecified ovarian cyst, left side: Secondary | ICD-10-CM | POA: Diagnosis not present

## 2021-09-28 DIAGNOSIS — Z79899 Other long term (current) drug therapy: Secondary | ICD-10-CM | POA: Diagnosis not present

## 2021-09-28 DIAGNOSIS — C7A00 Malignant carcinoid tumor of unspecified site: Secondary | ICD-10-CM | POA: Diagnosis not present

## 2021-09-28 DIAGNOSIS — Z87891 Personal history of nicotine dependence: Secondary | ICD-10-CM | POA: Insufficient documentation

## 2021-09-28 DIAGNOSIS — E042 Nontoxic multinodular goiter: Secondary | ICD-10-CM

## 2021-09-28 LAB — COMPREHENSIVE METABOLIC PANEL
ALT: 12 U/L (ref 0–44)
AST: 19 U/L (ref 15–41)
Albumin: 3.9 g/dL (ref 3.5–5.0)
Alkaline Phosphatase: 42 U/L (ref 38–126)
Anion gap: 6 (ref 5–15)
BUN: 21 mg/dL (ref 8–23)
CO2: 27 mmol/L (ref 22–32)
Calcium: 8.9 mg/dL (ref 8.9–10.3)
Chloride: 105 mmol/L (ref 98–111)
Creatinine, Ser: 0.73 mg/dL (ref 0.44–1.00)
GFR, Estimated: >60 mL/min (ref >60)
Glucose, Bld: 100 mg/dL — ABNORMAL HIGH (ref 70–99)
Potassium: 4.3 mmol/L (ref 3.5–5.1)
Sodium: 138 mmol/L (ref 135–145)
Total Bilirubin: 0.6 mg/dL (ref 0.3–1.2)
Total Protein: 7.3 g/dL (ref 6.5–8.1)

## 2021-09-28 LAB — CBC WITH DIFFERENTIAL/PLATELET
Abs Immature Granulocytes: 0.03 10*3/uL (ref 0.00–0.07)
Basophils Absolute: 0.1 10*3/uL (ref 0.0–0.1)
Basophils Relative: 1 %
Eosinophils Absolute: 0.3 10*3/uL (ref 0.0–0.5)
Eosinophils Relative: 5 %
HCT: 46.5 % — ABNORMAL HIGH (ref 36.0–46.0)
Hemoglobin: 14.8 g/dL (ref 12.0–15.0)
Immature Granulocytes: 0 %
Lymphocytes Relative: 23 %
Lymphs Abs: 1.7 10*3/uL (ref 0.7–4.0)
MCH: 29.4 pg (ref 26.0–34.0)
MCHC: 31.8 g/dL (ref 30.0–36.0)
MCV: 92.4 fL (ref 80.0–100.0)
Monocytes Absolute: 0.5 10*3/uL (ref 0.1–1.0)
Monocytes Relative: 6 %
Neutro Abs: 4.9 10*3/uL (ref 1.7–7.7)
Neutrophils Relative %: 65 %
Platelets: 161 10*3/uL (ref 150–400)
RBC: 5.03 MIL/uL (ref 3.87–5.11)
RDW: 14.8 % (ref 11.5–15.5)
WBC: 7.5 10*3/uL (ref 4.0–10.5)
nRBC: 0 % (ref 0.0–0.2)

## 2021-09-28 LAB — IRON AND TIBC
Iron: 76 ug/dL (ref 28–170)
Saturation Ratios: 21 % (ref 10.4–31.8)
TIBC: 368 ug/dL (ref 250–450)
UIBC: 292 ug/dL

## 2021-09-28 LAB — VITAMIN B12: Vitamin B-12: 481 pg/mL (ref 180–914)

## 2021-09-28 LAB — FERRITIN: Ferritin: 26 ng/mL (ref 11–307)

## 2021-09-28 NOTE — Progress Notes (Signed)
No concerns. 

## 2021-09-28 NOTE — Progress Notes (Signed)
Alicia Holder OFFICE PROGRESS NOTE  Patient Care Team: [place] as PCP - General Alicia Lame, MD as Consulting Physician (Gastroenterology) Alicia Asal, MD (Inactive) as Referring Physician (Hematology and Oncology)   Cancer Staging  No matching staging information was found for the patient.   Oncology History   No history exists.      INTERVAL HISTORY: walks with a cane; alone.   Alicia Holder 71 y.o.  female pleasant patient above history of erythrocytosis/active smoker is here for follow-up.  Patient denies any abdominal pain nausea vomiting.  No fever no chills.  She has been try to lose weight. She stated she is cutting down on smoking. She walks with a cane. Chronic shortness of breath chronic fatigue chronic back pain.  Review of Systems  Constitutional:  Positive for malaise/fatigue. Negative for chills, diaphoresis, fever and weight loss.  HENT:  Negative for nosebleeds and sore throat.   Eyes:  Negative for double vision.  Respiratory:  Positive for shortness of breath. Negative for cough, hemoptysis, sputum production and wheezing.   Cardiovascular:  Negative for chest pain, palpitations, orthopnea and leg swelling.  Gastrointestinal:  Negative for abdominal pain, blood in stool, constipation, diarrhea, heartburn, melena, nausea and vomiting.  Genitourinary:  Negative for dysuria, frequency and urgency.  Musculoskeletal:  Positive for back pain and joint pain.  Skin: Negative.  Negative for itching and rash.  Neurological:  Negative for dizziness, tingling, focal weakness, weakness and headaches.  Endo/Heme/Allergies:  Does not bruise/bleed easily.  Psychiatric/Behavioral:  Negative for depression. The patient is not nervous/anxious and does not have insomnia.       PAST MEDICAL HISTORY :  Past Medical History:  Diagnosis Date   AAA (abdominal aortic aneurysm) ([facility])    Arthritis    knees, elbows   Asthma    Colon  cancer ([place])    colon   COPD (chronic obstructive pulmonary disease) ([facility])    GERD (gastroesophageal reflux disease)    Hypertension    Sleep apnea     PAST SURGICAL HISTORY :   Past Surgical History:  Procedure Laterality Date   ABDOMINAL HYSTERECTOMY     APPENDECTOMY     CATARACT EXTRACTION W/PHACO Left 05/28/2019   Procedure: CATARACT EXTRACTION PHACO AND INTRAOCULAR LENS PLACEMENT ([place]) LEFT VISION BLUE 31.19 02:18.2;  Surgeon: Birder Robson, MD;  Location: [place];  Service: Ophthalmology;  Laterality: Left;   CATARACT EXTRACTION W/PHACO Right 06/18/2019   Procedure: CATARACT EXTRACTION PHACO AND INTRAOCULAR LENS PLACEMENT ([facility]) RIGHT 34.59  02:31.7;  Surgeon: Birder Robson, MD;  Location: [place];  Service: Ophthalmology;  Laterality: Right;  sleep apnea   ESOPHAGOGASTRODUODENOSCOPY N/A 01/12/2019   Procedure: ESOPHAGOGASTRODUODENOSCOPY (EGD);  Surgeon: Virgel Manifold, MD;  Location[facility] END[facility]COPY;  Service: Endoscopy;  Laterality: N/A;   ESOPHAGOGASTRODUODENOSCOPY (EGD) WITH PROPOFOL N/A 01/14/2019   Procedure: ESOPHAGOGASTRODUODENOSCOPY (EGD) WITH PROPOFOL;  Surgeon: [name], MD;  Location: [place];  Service: Gastroenterology;  Laterality: N/A;   HEMOSTASIS CLIP PLACEMENT  01/14/2019   Procedure: HEMOSTASIS CLIP PLACEMENT;  Surgeon: [name], MD;  Location: [facility] ENDOSCOPY;  Service: Gastroenterology;;   KNEE SURGERY Right    fracture repair   TONSILLECTOMY     TUBAL LIGATION     TUMOR EXCISION     colon    FAMILY HISTORY :   Family History  Problem Relation Age of Onset   CAD Mother    CAD Father     SOCIAL HISTORY:  Social History   Tobacco Use   Smoking status: Former    Packs/day: 1.00    Years: 41.00    Total pack years: 41.00    Types: Cigarettes    Quit date: 01/01/2019    Years since quitting: 2.7   Smokeless tobacco: Never  Vaping Use   Vaping Use: Never used  Substance Use Topics   Alcohol use: Yes     Comment: rare- couple times per year   Drug use: Never    ALLERGIES:  is allergic to aspirin.  MEDICATIONS:  Current Outpatient Medications  Medication Sig Dispense Refill   acetaminophen (TYLENOL) 500 MG tablet Take 1,000 mg by mouth every 6 (six) hours as needed for mild pain.     albuterol (VENTOLIN HFA) 108 (90 Base) MCG/ACT inhaler Inhale 1-2 puffs into the lungs every 4 (four) hours as needed for wheezing or shortness of breath.      alendronate (FOSAMAX) 70 MG tablet Take 70 mg by mouth once a week.     buPROPion (WELLBUTRIN XL) 150 MG 24 hr tablet Take 150 mg by mouth daily.      docusate sodium (COLACE) 100 MG capsule Take 100 mg by mouth daily.     lisinopril (ZESTRIL) 20 MG tablet Take 20 mg by mouth daily.      montelukast (SINGULAIR) 10 MG tablet Take 10 mg by mouth at bedtime.     omeprazole (PRILOSEC) 20 MG capsule Take 20 mg by mouth 2 (two) times daily.     oxyCODONE (OXY IR/ROXICODONE) 5 MG immediate release tablet Take 5 mg by mouth 3 (three) times daily as needed for moderate pain or breakthrough pain.      pantoprazole (PROTONIX) 40 MG tablet TAKE 1 TABLET BY MOUTH EVERY DAY 90 tablet 0   sertraline (ZOLOFT) 100 MG tablet Take 100 mg by mouth daily.     simvastatin (ZOCOR) 20 MG tablet Take 20 mg by mouth daily.     TRELEGY ELLIPTA 100-62.5-25 MCG/INH AEPB Inhale 1 puff into the lungs daily.      ipratropium-albuterol (DUONEB) 0.5-2.5 (3) MG/3ML SOLN Take 3 mLs by nebulization every 4 (four) hours as needed. (Patient not taking: Reported on 09/28/2021) 360 mL 0   No current facility-administered medications for this visit.    PHYSICAL EXAMINATION: ECOG PERFORMANCE STATUS: 0 - Asymptomatic  BP 115/81 (BP Location: Right Arm, Patient Position: Sitting, Cuff Size: Normal)   Pulse 90   Temp (!) 96.8 F (36 C) (Tympanic)   Ht '5\' 4"'$  (1.626 m)   Wt 153 lb 9.6 oz (69.7 kg)   SpO2 94%   BMI 26.37 kg/m   Filed Weights   09/28/21 1337  Weight: 153 lb 9.6 oz (69.7  kg)     Physical Exam Vitals and nursing note reviewed.  HENT:     Head: Normocephalic and atraumatic.     Mouth/Throat:     Pharynx: Oropharynx is clear.  Eyes:     Extraocular Movements: Extraocular movements intact.     Pupils: Pupils are equal, round, and reactive to light.  Cardiovascular:     Rate and Rhythm: Normal rate and regular rhythm.  Pulmonary:     Comments: Decreased breath sounds bilaterally.  Abdominal:     Palpations: Abdomen is soft.  Musculoskeletal:        General: Normal range of motion.     Cervical back: Normal range of motion.  Skin:    General: Skin is warm.  Neurological:  General: No focal deficit present.     Mental Status: She is alert and oriented to person, place, and time.  Psychiatric:        Behavior: Behavior normal.        Judgment: Judgment normal.        LABORATORY DATA:  I have reviewed the data as listed    Component Value Date/Time   NA 138 09/28/2021 1302   K 4.3 09/28/2021 1302   CL 105 09/28/2021 1302   CO2 27 09/28/2021 1302   GLUCOSE 100 (H) 09/28/2021 1302   BUN 21 09/28/2021 1302   CREATININE 0.73 09/28/2021 1302   CALCIUM 8.9 09/28/2021 1302   PROT 7.3 09/28/2021 1302   ALBUMIN 3.9 09/28/2021 1302   AST 19 09/28/2021 1302   ALT 12 09/28/2021 1302   ALKPHOS 42 09/28/2021 1302   BILITOT 0.6 09/28/2021 1302   GFRNONAA >60 09/28/2021 1302   GFRAA >60 02/21/2019 0944    No results found for: "SPEP", "UPEP"  Lab Results  Component Value Date   WBC 7.5 09/28/2021   NEUTROABS 4.9 09/28/2021   HGB 14.8 09/28/2021   HCT 46.5 (H) 09/28/2021   MCV 92.4 09/28/2021   PLT 161 09/28/2021      Chemistry      Component Value Date/Time   NA 138 09/28/2021 1302   K 4.3 09/28/2021 1302   CL 105 09/28/2021 1302   CO2 27 09/28/2021 1302   BUN 21 09/28/2021 1302   CREATININE 0.73 09/28/2021 1302      Component Value Date/Time   CALCIUM 8.9 09/28/2021 1302   ALKPHOS 42 09/28/2021 1302   AST 19 09/28/2021  1302   ALT 12 09/28/2021 1302   BILITOT 0.6 09/28/2021 1302       RADIOGRAPHIC STUDIES: I have personally reviewed the radiological images as listed and agreed with the findings in the report. No results found.   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   # History of iron deficient anemia/GI bleed [202 EGD]-hemoglobin STABLE.   #  B12 deficiency: continue B12 supplementation- STABLE.   # History of carcinoid tumor: Chromogranin was 926.7 (high) on 02/13/2019; Unclear if elevation due to recurrent carcinoid, chronic atrophic gastritis or PPI.STABLE.               #   Thyroid nodules and low TS:  will order follow Thyroid US today.   # Left ovarian cyst- Patient is asymptomatic; continue surveillance [Dr.jackson].STABLE.   # Smoker/lung nodules continue surveillance low-dose APRIL 2023-  CT-  Lung-RADS 4B, suspicious; Numerous new indistinct solid pulmonary nodules scattered throughout  both lungs, largest 11.7 mm in volume derived mean diameter in the right lower lobe, potentially inflammatory.  Awaiting repeat CT in April 2024. Currently smoking 3 cig/day.   # DISPOSITION; # follow up in 6 months-MD;  labs- cbc/cmp;iron  studies/ferritin/B12; chromogranin A levels-; Thyroid US- Dr.B   Orders Placed This Encounter  Procedures   US THYROID    Standing Status:   Future    Standing Expiration Date:   09/29/2022    Order Specific Question:   Reason for Exam (SYMPTOM  OR DIAGNOSIS REQUIRED)    Answer:   thyroid nodules    Order Specific Question:   Preferred imaging location?    Answer:   [facility]    All questions were answered. The patient knows to call the clinic with any problems, questions or concerns.  Cammie Sickle, MD 09/28/2021 2:00 PM

## 2021-09-28 NOTE — Assessment & Plan Note (Addendum)
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   # History of iron deficient anemia/GI bleed [202 EGD]-hemoglobin STABLE.   # B12 deficiency: continue B12 supplementation- STABLE.   # History of carcinoid tumor: Chromogranin was 926.7 (high) on 02/13/2019; Unclear if elevation due to recurrent carcinoid, chronic atrophic gastritis or PPI.STABLE.               #Thyroid nodules and low TS:  will order follow Thyroid US today.   # Left ovarian cyst- Patient is asymptomatic; continue surveillance [Dr.jackson].STABLE.   # Smoker/lung nodules continue surveillance low-dose APRIL 2023-  CT-  Lung-RADS 4B, suspicious; Numerous new indistinct solid pulmonary nodules scattered throughout  both lungs, largest 11.7 mm in volume derived mean diameter in the right lower lobe, potentially inflammatory.  Awaiting repeat CT in April 2024. Currently smoking 3 cig/day.   # DISPOSITION; # follow up in 6 months-MD;  labs- cbc/cmp;iron  studies/ferritin/B12; chromogranin A levels-; Thyroid US- Dr.B

## 2021-09-29 LAB — CHROMOGRANIN A: Chromogranin A (ng/mL): 977.4 ng/mL — ABNORMAL HIGH (ref 0.0–101.8)

## 2021-09-30 ENCOUNTER — Ambulatory Visit
Admission: RE | Admit: 2021-09-30 | Discharge: 2021-09-30 | Disposition: A | Discharge status: Home / Self Care | Payer: Medicare PPO | Source: Ambulatory Visit | Attending: Obstetrics and Gynecology | Admitting: Obstetrics and Gynecology

## 2021-09-30 DIAGNOSIS — N83202 Unspecified ovarian cyst, left side: Secondary | ICD-10-CM | POA: Diagnosis present

## 2021-10-08 ENCOUNTER — Telehealth: Payer: Self-pay

## 2021-10-08 NOTE — Telephone Encounter (Signed)
Patient advised of U/S- Unchanged No Surgery or F/U needed.

## 2021-10-08 NOTE — Telephone Encounter (Signed)
-----   Message from Mora Bellman, MD sent at 10/08/2021  1:06 PM EDT ----- Please inform patient of pelvic ultrasound similar to the one in March. Small ovarian cysts are again seen on the left ovary, unchanged in size. No need for surgical intervention at this time or further follow up.   Alicia Holder

## 2021-11-07 ENCOUNTER — Emergency Department
Admission: EM | Admit: 2021-11-07 | Discharge: 2021-11-07 | Disposition: A | Discharge status: Home / Self Care | Payer: Medicare PPO | Attending: Emergency Medicine | Admitting: Emergency Medicine

## 2021-11-07 ENCOUNTER — Other Ambulatory Visit: Payer: Self-pay

## 2021-11-07 ENCOUNTER — Emergency Department: Payer: Medicare PPO

## 2021-11-07 ENCOUNTER — Encounter: Payer: Self-pay | Admitting: Emergency Medicine

## 2021-11-07 DIAGNOSIS — R079 Chest pain, unspecified: Secondary | ICD-10-CM | POA: Diagnosis not present

## 2021-11-07 DIAGNOSIS — R109 Unspecified abdominal pain: Secondary | ICD-10-CM | POA: Diagnosis not present

## 2021-11-07 DIAGNOSIS — K920 Hematemesis: Secondary | ICD-10-CM

## 2021-11-07 DIAGNOSIS — R112 Nausea with vomiting, unspecified: Secondary | ICD-10-CM

## 2021-11-07 DIAGNOSIS — R531 Weakness: Secondary | ICD-10-CM | POA: Diagnosis not present

## 2021-11-07 DIAGNOSIS — R0602 Shortness of breath: Secondary | ICD-10-CM | POA: Diagnosis not present

## 2021-11-07 DIAGNOSIS — Z20822 Contact with and (suspected) exposure to covid-19: Secondary | ICD-10-CM | POA: Insufficient documentation

## 2021-11-07 LAB — TROPONIN I (HIGH SENSITIVITY)
Troponin I (High Sensitivity): 4 ng/L (ref <18)
Troponin I (High Sensitivity): 5 ng/L (ref <18)

## 2021-11-07 LAB — LIPASE, BLOOD: Lipase: 34 U/L (ref 11–51)

## 2021-11-07 LAB — COMPREHENSIVE METABOLIC PANEL
ALT: 13 U/L (ref 0–44)
AST: 18 U/L (ref 15–41)
Albumin: 4.1 g/dL (ref 3.5–5.0)
Alkaline Phosphatase: 43 U/L (ref 38–126)
Anion gap: 9 (ref 5–15)
BUN: 13 mg/dL (ref 8–23)
CO2: 27 mmol/L (ref 22–32)
Calcium: 9.7 mg/dL (ref 8.9–10.3)
Chloride: 102 mmol/L (ref 98–111)
Creatinine, Ser: 0.7 mg/dL (ref 0.44–1.00)
GFR, Estimated: >60 mL/min (ref >60)
Glucose, Bld: 132 mg/dL — ABNORMAL HIGH (ref 70–99)
Potassium: 4.4 mmol/L (ref 3.5–5.1)
Sodium: 138 mmol/L (ref 135–145)
Total Bilirubin: 0.9 mg/dL (ref 0.3–1.2)
Total Protein: 7.8 g/dL (ref 6.5–8.1)

## 2021-11-07 LAB — CBC WITH DIFFERENTIAL/PLATELET
Abs Immature Granulocytes: 0.04 10*3/uL (ref 0.00–0.07)
Abs Immature Granulocytes: 0.03 10*3/uL (ref 0.00–0.07)
Basophils Absolute: 0.1 10*3/uL (ref 0.0–0.1)
Basophils Absolute: 0 10*3/uL (ref 0.0–0.1)
Basophils Relative: 1 %
Basophils Relative: 0 %
Eosinophils Absolute: 0 10*3/uL (ref 0.0–0.5)
Eosinophils Absolute: 0 10*3/uL (ref 0.0–0.5)
Eosinophils Relative: 0 %
Eosinophils Relative: 0 %
HCT: 53.8 % — ABNORMAL HIGH (ref 36.0–46.0)
HCT: 48.7 % — ABNORMAL HIGH (ref 36.0–46.0)
Hemoglobin: 17.1 g/dL — ABNORMAL HIGH (ref 12.0–15.0)
Hemoglobin: 15.1 g/dL — ABNORMAL HIGH (ref 12.0–15.0)
Immature Granulocytes: 0 %
Immature Granulocytes: 0 %
Lymphocytes Relative: 13 %
Lymphocytes Relative: 13 %
Lymphs Abs: 1.5 10*3/uL (ref 0.7–4.0)
Lymphs Abs: 1.3 10*3/uL (ref 0.7–4.0)
MCH: 29.1 pg (ref 26.0–34.0)
MCH: 28.9 pg (ref 26.0–34.0)
MCHC: 31.8 g/dL (ref 30.0–36.0)
MCHC: 31 g/dL (ref 30.0–36.0)
MCV: 91.7 fL (ref 80.0–100.0)
MCV: 93.3 fL (ref 80.0–100.0)
Monocytes Absolute: 0.7 10*3/uL (ref 0.1–1.0)
Monocytes Absolute: 0.7 10*3/uL (ref 0.1–1.0)
Monocytes Relative: 6 %
Monocytes Relative: 8 %
Neutro Abs: 9.7 10*3/uL — ABNORMAL HIGH (ref 1.7–7.7)
Neutro Abs: 7.7 10*3/uL (ref 1.7–7.7)
Neutrophils Relative %: 80 %
Neutrophils Relative %: 79 %
Platelets: 191 10*3/uL (ref 150–400)
Platelets: 151 10*3/uL (ref 150–400)
RBC: 5.87 MIL/uL — ABNORMAL HIGH (ref 3.87–5.11)
RBC: 5.22 MIL/uL — ABNORMAL HIGH (ref 3.87–5.11)
RDW: 15.4 % (ref 11.5–15.5)
RDW: 15.6 % — ABNORMAL HIGH (ref 11.5–15.5)
WBC: 12 10*3/uL — ABNORMAL HIGH (ref 4.0–10.5)
WBC: 9.8 10*3/uL (ref 4.0–10.5)
nRBC: 0 % (ref 0.0–0.2)
nRBC: 0 % (ref 0.0–0.2)

## 2021-11-07 LAB — TYPE AND SCREEN
ABO/RH(D): A POS
Antibody Screen: NEGATIVE

## 2021-11-07 LAB — RESP PANEL BY RT-PCR (FLU A&B, COVID) ARPGX2
Influenza A by PCR: NEGATIVE
Influenza B by PCR: NEGATIVE
SARS Coronavirus 2 by RT PCR: NEGATIVE

## 2021-11-07 LAB — PROTIME-INR
INR: 1 (ref 0.8–1.2)
Prothrombin Time: 13.4 seconds (ref 11.4–15.2)

## 2021-11-07 MED ORDER — SODIUM CHLORIDE 0.9 % IV BOLUS
1000.0000 mL | Freq: Once | INTRAVENOUS | Status: AC
  Administered 2021-11-07: 1000 mL via INTRAVENOUS

## 2021-11-07 MED ORDER — ONDANSETRON HCL 4 MG PO TABS: 4.0000 mg | ORAL_TABLET | Freq: Three times a day (TID) | ORAL | 0 refills | Status: DC | PRN

## 2021-11-07 MED ORDER — ONDANSETRON HCL 4 MG/2ML IJ SOLN
4.0000 mg | Freq: Once | INTRAMUSCULAR | Status: AC
  Administered 2021-11-07: 4 mg via INTRAVENOUS
  Filled 2021-11-07: qty 2

## 2021-11-07 MED ORDER — PANTOPRAZOLE SODIUM 40 MG IV SOLR
40.0000 mg | Freq: Once | INTRAVENOUS | Status: AC
  Administered 2021-11-07: 40 mg via INTRAVENOUS
  Filled 2021-11-07: qty 10

## 2021-11-07 MED ORDER — PANTOPRAZOLE SODIUM 40 MG PO TBEC: 40.0000 mg | DELAYED_RELEASE_TABLET | Freq: Every day | ORAL | 1 refills | Status: DC

## 2021-11-07 NOTE — ED Provider Triage Note (Signed)
Emergency Medicine Provider Triage Evaluation Note  Alicia Holder , a 71 y.o. female  was evaluated in triage.  Pt complains of vomiting since yesterday. Reports that there as blood in her emesis, reports dark red blood, estimates "a couple of cups."  Has had esophageal bleeding ulcer before and has required a clamp in 01/2019 and multiple blood transfusions. Has chest pain and upper abdominal pain. Reports SOB and CP currently, O2 is 90%.   Review of Systems  Positive: Chest pain, epigastric pain, hematemesis Negative: fever  Physical Exam  There were no vitals taken for this visit. Gen:   Awake, maroon colored emesis in basin   Resp:  Normal effort  MSK:   Moves extremities without difficulty  Other:    Medical Decision Making  Medically screening exam initiated at 12:39 PM.  Appropriate orders placed.  Alicia Holder was informed that the remainder of the evaluation will be completed by another provider, this initial triage assessment does not replace that evaluation, and the importance of remaining in the ED until their evaluation is complete.     Marquette Old, PA-C 11/07/21 1246

## 2021-11-07 NOTE — ED Triage Notes (Signed)
Pt reports last night started getting sick to her stomach and when she would vomit it would be black like with blood in it. Pt reports hx of esophageal bleeds and has to have it clamped once and get blood transfusions.

## 2021-11-07 NOTE — Discharge Instructions (Signed)
Please seek medical attention for any high fevers, chest pain, shortness of breath, change in behavior, persistent vomiting, bloody stool or any other new or concerning symptoms.  

## 2021-11-07 NOTE — ED Provider Notes (Signed)
[facility] Provider Note    Event Date/Time   First MD Initiated Contact with Patient 11/07/21 1507     (approximate)   History   Hematemesis, Chest Pain, and Abdominal Pain   HPI  Alicia Holder is a 71 y.o. female  who presents to the emergency department today because of concern for nausea, vomiting and hematemesis. Symptoms started a couple of days ago with nausea and vomiting. Had some associated heart burn. This morning noticed some blood in her emesis. Has also noticed some bloody stool. Also noticed some weakness and shortness of breath with ambulation today. States roughly 2 years ago had to have something clipped in her esophagus for bleeding. Denies any recent sick contacts. Denies any unusual ingestions.       Physical Exam   Triage Vital Signs: ED Triage Vitals  Enc Vitals Group     BP 11/07/21 1243 (!) 127/98     Pulse Rate 11/07/21 1243 92     Resp 11/07/21 1243 20     Temp 11/07/21 1243 97.7 F (36.5 C)     Temp Source 11/07/21 1243 Oral     SpO2 11/07/21 1243 (!) 89 %     Weight 11/07/21 1240 160 lb (72.6 kg)     Height 11/07/21 1240 '5\' 3"'$  (1.6 m)     Head Circumference --      Peak Flow --      Pain Score 11/07/21 1240 10     Pain Loc --      Pain Edu? --      Excl. in [place]? --     Most recent vital signs: Vitals:   11/07/21 1243 11/07/21 1451  BP: (!) 127/98 (!) 141/92  Pulse: 92 75  Resp: 20 16  Temp: 97.7 F (36.5 C) 97.7 F (36.5 C)  SpO2: (!) 89% 97%   General: Awake, alert, oriented. CV:  Good peripheral perfusion. Regular rate and rhythm. Resp:  Normal effort. Lungs clear. Abd:  No distention. Non tender.    ED Results / Procedures / Treatments   Labs (all labs ordered are listed, but only abnormal results are displayed) Labs Reviewed  CBC WITH DIFFERENTIAL/PLATELET - Abnormal; Notable for the following components:      Result Value   WBC 12.0 (*)    RBC 5.87 (*)    Hemoglobin 17.1 (*)    HCT  53.8 (*)    Neutro Abs 9.7 (*)    All other components within normal limits  COMPREHENSIVE METABOLIC PANEL - Abnormal; Notable for the following components:   Glucose, Bld 132 (*)    All other components within normal limits  CBC WITH DIFFERENTIAL/PLATELET - Abnormal; Notable for the following components:   RBC 5.22 (*)    Hemoglobin 15.1 (*)    HCT 48.7 (*)    RDW 15.6 (*)    All other components within normal limits  RESP PANEL BY RT-PCR (FLU A&B, COVID) [id number]  PROTIME-INR  LIPASE, BLOOD  TYPE AND SCREEN  TYPE AND SCREEN  TROPONIN I (HIGH SENSITIVITY)  TROPONIN I (HIGH SENSITIVITY)     EKG  I, Nance Pear, attending physician, personally viewed and interpreted this EKG  EKG Time: 1316 Rate: 96 Rhythm: normal sinus rhythm Axis: normal Intervals: qtc 419 QRS: narrow, q waves v1 ST changes: no st elevation Impression: abnormal ekg   RADIOLOGY I independently interpreted and visualized the CXR. My interpretation: No pneumonia Radiology interpretation:  IMPRESSION:  There are no  signs of pulmonary edema or focal pulmonary  consolidation.      PROCEDURES:  Critical Care performed: No  Procedures   MEDICATIONS ORDERED IN ED: Medications  pantoprazole (PROTONIX) injection 40 mg (40 mg Intravenous Given 11/07/21 1449)     IMPRESSION / MDM / ASSESSMENT AND PLAN / ED COURSE  I reviewed the triage vital signs and the nursing notes.                              Differential diagnosis includes, but is not limited to, esophagitis, ulcer, viral illness.  Patient's presentation is most consistent with acute presentation with potential threat to life or bodily function.  Patient presented to the emergency department today because of concern for nausea vomiting and bloody emesis. Per chart review patient had EGD performed a couple of years ago which showed esophagitis. Initial blood work without anemia. Do think patient likely dehydrated. Will plan on giving  medications here in the emergency department and rechecking hgb. On repeat testing hgb did drop, however WBC dropped as well. At this time I think likely second to fluid administration. No bleeding here in the emergency department. Patient did feel better after IVFs and nausea medication. Patient did not need any further supplemental oxygen. At this time given clinical improvement and lack of further bleeding I do think it is reasonable for patient to be discharged.    FINAL CLINICAL IMPRESSION(S) / ED DIAGNOSES   Final diagnoses:  Nausea and vomiting, unspecified vomiting type  Hematemesis, unspecified whether nausea present     Note:  This document was prepared using Dragon voice recognition software and may include unintentional dictation errors.    Nance Pear, MD 11/07/21 2034

## 2022-03-28 ENCOUNTER — Ambulatory Visit: Payer: Medicare PPO | Attending: Internal Medicine

## 2022-03-31 ENCOUNTER — Inpatient Hospital Stay: Payer: Medicare PPO

## 2022-03-31 ENCOUNTER — Inpatient Hospital Stay: Payer: Medicare PPO | Attending: Internal Medicine | Admitting: Internal Medicine

## 2022-03-31 ENCOUNTER — Encounter: Payer: Self-pay | Admitting: Internal Medicine

## 2022-04-04 ENCOUNTER — Ambulatory Visit (INDEPENDENT_AMBULATORY_CARE_PROVIDER_SITE_OTHER): Payer: Medicare PPO | Admitting: Vascular Surgery

## 2022-04-04 ENCOUNTER — Encounter (INDEPENDENT_AMBULATORY_CARE_PROVIDER_SITE_OTHER): Payer: Medicare PPO

## 2022-04-24 ENCOUNTER — Other Ambulatory Visit: Payer: Self-pay | Admitting: Acute Care

## 2022-04-24 DIAGNOSIS — Z87891 Personal history of nicotine dependence: Secondary | ICD-10-CM

## 2022-04-24 DIAGNOSIS — Z122 Encounter for screening for malignant neoplasm of respiratory organs: Secondary | ICD-10-CM

## 2022-05-31 ENCOUNTER — Ambulatory Visit: Payer: Medicare PPO | Attending: Program of All-Inclusive Care for the Elderly (PACE) Provider Organization

## 2022-08-16 ENCOUNTER — Other Ambulatory Visit: Payer: Self-pay | Admitting: Family Medicine

## 2022-08-16 DIAGNOSIS — Z1231 Encounter for screening mammogram for malignant neoplasm of breast: Secondary | ICD-10-CM

## 2022-08-24 ENCOUNTER — Ambulatory Visit: Payer: Medicare PPO

## 2022-09-14 ENCOUNTER — Ambulatory Visit: Payer: Medicare PPO

## 2023-04-28 ENCOUNTER — Other Ambulatory Visit: Payer: Self-pay

## 2023-04-28 DIAGNOSIS — Z87891 Personal history of nicotine dependence: Secondary | ICD-10-CM

## 2023-04-28 DIAGNOSIS — Z122 Encounter for screening for malignant neoplasm of respiratory organs: Secondary | ICD-10-CM

## 2023-05-11 ENCOUNTER — Encounter: Payer: Self-pay | Admitting: Acute Care

## 2023-06-08 ENCOUNTER — Ambulatory Visit: Admission: RE | Admit: 2023-06-08 | Source: Ambulatory Visit

## 2023-09-28 ENCOUNTER — Other Ambulatory Visit: Payer: Self-pay | Admitting: Family Medicine

## 2023-09-28 DIAGNOSIS — Z1231 Encounter for screening mammogram for malignant neoplasm of breast: Secondary | ICD-10-CM

## 2023-10-24 ENCOUNTER — Ambulatory Visit
Admission: EM | Admit: 2023-10-24 | Discharge: 2023-10-24 | Attending: Emergency Medicine | Admitting: Emergency Medicine

## 2023-10-24 ENCOUNTER — Inpatient Hospital Stay
Admission: EM | Admit: 2023-10-24 | Discharge: 2023-10-27 | DRG: 480 | Disposition: A | Discharge status: Home Health | Attending: Internal Medicine | Admitting: Internal Medicine

## 2023-10-24 ENCOUNTER — Emergency Department

## 2023-10-24 ENCOUNTER — Ambulatory Visit (INDEPENDENT_AMBULATORY_CARE_PROVIDER_SITE_OTHER)

## 2023-10-24 ENCOUNTER — Other Ambulatory Visit: Payer: Self-pay

## 2023-10-24 DIAGNOSIS — Z7983 Long term (current) use of bisphosphonates: Secondary | ICD-10-CM

## 2023-10-24 DIAGNOSIS — M25552 Pain in left hip: Secondary | ICD-10-CM

## 2023-10-24 DIAGNOSIS — J449 Chronic obstructive pulmonary disease, unspecified: Secondary | ICD-10-CM | POA: Diagnosis present

## 2023-10-24 DIAGNOSIS — Z8711 Personal history of peptic ulcer disease: Secondary | ICD-10-CM

## 2023-10-24 DIAGNOSIS — S72115A Nondisplaced fracture of greater trochanter of left femur, initial encounter for closed fracture: Secondary | ICD-10-CM

## 2023-10-24 DIAGNOSIS — S72145A Nondisplaced intertrochanteric fracture of left femur, initial encounter for closed fracture: Secondary | ICD-10-CM | POA: Diagnosis not present

## 2023-10-24 DIAGNOSIS — I712 Thoracic aortic aneurysm, without rupture, unspecified: Secondary | ICD-10-CM | POA: Diagnosis present

## 2023-10-24 DIAGNOSIS — Z90710 Acquired absence of both cervix and uterus: Secondary | ICD-10-CM

## 2023-10-24 DIAGNOSIS — G4730 Sleep apnea, unspecified: Secondary | ICD-10-CM | POA: Diagnosis present

## 2023-10-24 DIAGNOSIS — J9611 Chronic respiratory failure with hypoxia: Secondary | ICD-10-CM | POA: Diagnosis present

## 2023-10-24 DIAGNOSIS — I10 Essential (primary) hypertension: Secondary | ICD-10-CM | POA: Diagnosis present

## 2023-10-24 DIAGNOSIS — W19XXXA Unspecified fall, initial encounter: Principal | ICD-10-CM | POA: Diagnosis present

## 2023-10-24 DIAGNOSIS — Z681 Body mass index (BMI) 19 or less, adult: Secondary | ICD-10-CM

## 2023-10-24 DIAGNOSIS — Z8249 Family history of ischemic heart disease and other diseases of the circulatory system: Secondary | ICD-10-CM

## 2023-10-24 DIAGNOSIS — S50819A Abrasion of unspecified forearm, initial encounter: Secondary | ICD-10-CM | POA: Diagnosis present

## 2023-10-24 DIAGNOSIS — Z79899 Other long term (current) drug therapy: Secondary | ICD-10-CM

## 2023-10-24 DIAGNOSIS — S72142A Displaced intertrochanteric fracture of left femur, initial encounter for closed fracture: Secondary | ICD-10-CM | POA: Diagnosis present

## 2023-10-24 DIAGNOSIS — Z86012 Personal history of benign carcinoid tumor: Secondary | ICD-10-CM | POA: Insufficient documentation

## 2023-10-24 DIAGNOSIS — I714 Abdominal aortic aneurysm, without rupture, unspecified: Secondary | ICD-10-CM | POA: Diagnosis present

## 2023-10-24 DIAGNOSIS — Z87891 Personal history of nicotine dependence: Secondary | ICD-10-CM

## 2023-10-24 DIAGNOSIS — E43 Unspecified severe protein-calorie malnutrition: Secondary | ICD-10-CM | POA: Diagnosis present

## 2023-10-24 DIAGNOSIS — S72009D Fracture of unspecified part of neck of unspecified femur, subsequent encounter for closed fracture with routine healing: Secondary | ICD-10-CM | POA: Insufficient documentation

## 2023-10-24 DIAGNOSIS — J4489 Other specified chronic obstructive pulmonary disease: Secondary | ICD-10-CM | POA: Diagnosis present

## 2023-10-24 DIAGNOSIS — L89312 Pressure ulcer of right buttock, stage 2: Secondary | ICD-10-CM | POA: Diagnosis present

## 2023-10-24 DIAGNOSIS — Z8719 Personal history of other diseases of the digestive system: Secondary | ICD-10-CM

## 2023-10-24 DIAGNOSIS — S7012XA Contusion of left thigh, initial encounter: Secondary | ICD-10-CM

## 2023-10-24 DIAGNOSIS — Z85038 Personal history of other malignant neoplasm of large intestine: Secondary | ICD-10-CM

## 2023-10-24 DIAGNOSIS — K219 Gastro-esophageal reflux disease without esophagitis: Secondary | ICD-10-CM | POA: Diagnosis present

## 2023-10-24 DIAGNOSIS — L89322 Pressure ulcer of left buttock, stage 2: Secondary | ICD-10-CM | POA: Diagnosis present

## 2023-10-24 DIAGNOSIS — R636 Underweight: Secondary | ICD-10-CM

## 2023-10-24 DIAGNOSIS — M159 Polyosteoarthritis, unspecified: Secondary | ICD-10-CM | POA: Diagnosis present

## 2023-10-24 LAB — CBC WITH DIFFERENTIAL/PLATELET
Abs Immature Granulocytes: 0.05 K/uL (ref 0.00–0.07)
Basophils Absolute: 0.1 K/uL (ref 0.0–0.1)
Basophils Relative: 1 %
Eosinophils Absolute: 0.1 K/uL (ref 0.0–0.5)
Eosinophils Relative: 1 %
HCT: 47.1 % — ABNORMAL HIGH (ref 36.0–46.0)
Hemoglobin: 14.8 g/dL (ref 12.0–15.0)
Immature Granulocytes: 1 %
Lymphocytes Relative: 17 %
Lymphs Abs: 1.6 K/uL (ref 0.7–4.0)
MCH: 29.3 pg (ref 26.0–34.0)
MCHC: 31.4 g/dL (ref 30.0–36.0)
MCV: 93.3 fL (ref 80.0–100.0)
Monocytes Absolute: 0.5 K/uL (ref 0.1–1.0)
Monocytes Relative: 6 %
Neutro Abs: 6.9 K/uL (ref 1.7–7.7)
Neutrophils Relative %: 74 %
Platelets: 224 K/uL (ref 150–400)
RBC: 5.05 MIL/uL (ref 3.87–5.11)
RDW: 14.5 % (ref 11.5–15.5)
WBC: 9.2 K/uL (ref 4.0–10.5)
nRBC: 0 % (ref 0.0–0.2)

## 2023-10-24 LAB — BASIC METABOLIC PANEL WITH GFR
Anion gap: 9 (ref 5–15)
BUN: 17 mg/dL (ref 8–23)
CO2: 28 mmol/L (ref 22–32)
Calcium: 9.1 mg/dL (ref 8.9–10.3)
Chloride: 102 mmol/L (ref 98–111)
Creatinine, Ser: 0.79 mg/dL (ref 0.44–1.00)
GFR, Estimated: >60 mL/min (ref >60)
Glucose, Bld: 130 mg/dL — ABNORMAL HIGH (ref 70–99)
Potassium: 4.1 mmol/L (ref 3.5–5.1)
Sodium: 139 mmol/L (ref 135–145)

## 2023-10-24 LAB — PROTIME-INR
INR: 1 (ref 0.8–1.2)
Prothrombin Time: 14.2 s (ref 11.4–15.2)

## 2023-10-24 LAB — TROPONIN I (HIGH SENSITIVITY): Troponin I (High Sensitivity): 4 ng/L (ref <18)

## 2023-10-24 MED ORDER — FENTANYL CITRATE PF 50 MCG/ML IJ SOSY
50.0000 ug | PREFILLED_SYRINGE | Freq: Once | INTRAMUSCULAR | Status: AC
  Administered 2023-10-24: 50 ug via INTRAVENOUS
  Filled 2023-10-24: qty 1

## 2023-10-24 NOTE — ED Notes (Signed)
 Patient transported to MRI

## 2023-10-24 NOTE — ED Provider Notes (Signed)
 [facility] Provider Note    Event Date/Time   First MD Initiated Contact with Patient 10/24/23 1445     (approximate)   History   Hip Pain   HPI  Alicia Holder is a 73 y.o. female past medical history significant for COPD on intermittent 2 L home oxygen, who presents to the emergency department with hip pain.  States that she fell when ambulating in her driveway approximately 1 week ago.  Ongoing pain to her left hip despite trying to do exercises for it.  States that she was seen at urgent care today and told that she had a questionable fracture and to come to the emergency department.  Denies being on anticoagulation.  No history of hip injury.     Physical Exam   Triage Vital Signs: ED Triage Vitals  Encounter Vitals Group     BP 10/24/23 1429 94/66     Girls Systolic BP Percentile --      Girls Diastolic BP Percentile --      Boys Systolic BP Percentile --      Boys Diastolic BP Percentile --      Pulse Rate 10/24/23 1429 87     Resp 10/24/23 1429 18     Temp 10/24/23 1429 98 F (36.7 C)     Temp src --      SpO2 10/24/23 1429 (!) 88 %     Weight --      Height --      Head Circumference --      Peak Flow --      Pain Score 10/24/23 1430 9     Pain Loc --      Pain Education --      Exclude from Growth Chart --     Most recent vital signs: Vitals:   10/24/23 1438 10/24/23 1500  BP: 101/77 102/71  Pulse: 68 81  Resp: 18 18  Temp:    SpO2: 94% 94%    Physical Exam Constitutional:      Appearance: She is well-developed.  HENT:     Head: Atraumatic.  Eyes:     Conjunctiva/sclera: Conjunctivae normal.  Cardiovascular:     Rate and Rhythm: Regular rhythm.  Pulmonary:     Effort: No respiratory distress.  Abdominal:     General: There is no distension.  Musculoskeletal:        General: Tenderness present. Normal range of motion.     Cervical back: Normal range of motion.     Comments: Tenderness to palpation to the  left hip  Skin:    General: Skin is warm.  Neurological:     Mental Status: She is alert. Mental status is at baseline.     IMPRESSION / MDM / ASSESSMENT AND PLAN / ED COURSE  I reviewed the triage vital signs and the nursing notes.  Differential diagnosis including hip fracture, musculoskeletal strain, electrolyte abnormality, dehydration  Discussed patient's case with Dr. Ezra who is on-call for orthopedics after reviewing patient's outpatient x-ray of the left hip.  Recommended an MRI of the left femur, if negative could discharge home with outpatient follow-up, if positive will need admission.   EKG  I, Clotilda Punter, the attending physician, personally viewed and interpreted this ECG.  EKG showed normal sinus rhythm.  Nonspecific ST changes.  Significant underlying artifact.  No significant change when compared to prior EKG.  No tachycardic or bradycardic dysrhythmias while on cardiac telemetry.  RADIOLOGY MRI  of the left hip LABS (all labs ordered are listed, but only abnormal results are displayed) Labs interpreted as -    Labs Reviewed  CBC WITH DIFFERENTIAL/PLATELET - Abnormal; Notable for the following components:      Result Value   HCT 47.1 (*)    All other components within normal limits  BASIC METABOLIC PANEL WITH GFR - Abnormal; Notable for the following components:   Glucose, Bld 130 (*)    All other components within normal limits  PROTIME-INR  TROPONIN I (HIGH SENSITIVITY)     MDM  Patient presents to the emergency department with left hip pain.  On chart review patient had an x-ray that showed a questionable greater trochanteric hip fracture.  After discussion with orthopedics recommending an MRI to further evaluate for occult fracture.  Given IV fentanyl  for pain control.  Patient did have hypoxia on room air but has been using oxygen intermittently for her chronic COPD.  No signs of pneumonia on my evaluation of her chest x-ray.  Care transferred  to incoming provider with MRI pending.     PROCEDURES:  Critical Care performed: No  Procedures  Patient's presentation is most consistent with acute presentation with potential threat to life or bodily function.   MEDICATIONS ORDERED IN ED: Medications  fentaNYL  (SUBLIMAZE ) injection 50 mcg (50 mcg Intravenous Given 10/24/23 1524)    FINAL CLINICAL IMPRESSION(S) / ED DIAGNOSES   Final diagnoses:  Fall, initial encounter  Left hip pain     Rx / DC Orders   ED Discharge Orders     None        Note:  This document was prepared using Dragon voice recognition software and may include unintentional dictation errors.   Suzanne Kirsch, MD 10/24/23 [phone]

## 2023-10-24 NOTE — ED Provider Notes (Signed)
 HPI  SUBJECTIVE:  Alicia Holder is a 73 y.o. female who presents with left lateral hip pain after having a misstep and fall onto her driveway, landing on her left hip 1 week ago.  She denies hitting her head, loss of consciousness or other significant injury.  Sustained several abrasions on her left forearm which are healing well.  She describes the hip pain as achy, constant.  She reports limitation of motion of the hip and is unable to bear weight independently.  She tried heat, Tylenol  and OxyContin  which is prescribed to her on a chronic basis.  No alleviating factors.  Symptoms worse with any movement, weightbearing. She has a past medical history of AAA, colon cancer, COPD, hypertension, osteoporosis, GI bleed, lung cancer.  No history of diabetes, chronic kidney disease.  [facility]P: [facility].  Additional history obtained from family member  Past Medical History:  Diagnosis Date   AAA (abdominal aortic aneurysm)    Arthritis    knees, elbows   Asthma    Colon cancer (HCC)    colon   COPD (chronic obstructive pulmonary disease) (HCC)    GERD (gastroesophageal reflux disease)    Hypertension    Sleep apnea     Past Surgical History:  Procedure Laterality Date   ABDOMINAL HYSTERECTOMY     APPENDECTOMY     CATARACT EXTRACTION W/PHACO Left 05/28/2019   Procedure: CATARACT EXTRACTION PHACO AND INTRAOCULAR LENS PLACEMENT (IOC) LEFT VISION BLUE 31.19 02:18.2;  Surgeon: Jaye Fallow, MD;  Location: [facility];  Service: Ophthalmology;  Laterality: Left;   CATARACT EXTRACTION W/PHACO Right 06/18/2019   Procedure: CATARACT EXTRACTION PHACO AND INTRAOCULAR LENS PLACEMENT (IOC) RIGHT 34.59  02:31.7;  Surgeon: Jaye Fallow, MD;  Location: [facility];  Service: Ophthalmology;  Laterality: Right;  sleep apnea   ESOPHAGOGASTRODUODENOSCOPY N/A 01/12/2019   Procedure: ESOPHAGOGASTRODUODENOSCOPY (EGD);  Surgeon: Janalyn Keene NOVAK, MD;  Location: [facility] END[facility]COPY;   Service: Endoscopy;  Laterality: N/A;   ESOPHAGOGASTRODUODENOSCOPY (EGD) WITH PROPOFOL  N/A 01/14/2019   Procedure: ESOPHAGOGASTRODUODENOSCOPY (EGD) WITH PROPOFOL ;  Surgeon: Saintclair Jasper, MD;  Location: [facility] ENDOSCOPY;  Service: Gastroenterology;  Laterality: N/A;   HEMOSTASIS CLIP PLACEMENT  01/14/2019   Procedure: HEMOSTASIS CLIP PLACEMENT;  Surgeon: Saintclair Jasper, MD;  Location: [facility] ENDOSCOPY;  Service: Gastroenterology;;   KNEE SURGERY Right    fracture repair   TONSILLECTOMY     TUBAL LIGATION     TUMOR EXCISION     colon    Family History  Problem Relation Age of Onset   CAD Mother    CAD Father     Social History   Tobacco Use   Smoking status: Former    Current packs/day: 0.00    Average packs/day: 1 pack/day for 41.0 years (41.0 ttl pk-yrs)    Types: Cigarettes    Start date: 12/31/1977    Quit date: 01/01/2019    Years since quitting: 4.8   Smokeless tobacco: Never  Vaping Use   Vaping status: Never Used  Substance Use Topics   Alcohol use: Yes    Comment: rare- couple times per year   Drug use: Never    No current facility-administered medications for this encounter.  Current Outpatient Medications:    Cholecalciferol (VITAMIN D3) 1.25 MG (50000 UT) CAPS, VITAMIN D3 1.25 MG (50000 UT) CAPS, Disp: , Rfl:    acetaminophen  (TYLENOL ) 500 MG tablet, Take 1,000 mg by mouth every 6 (six) hours as needed for mild pain., Disp: , Rfl:  albuterol  (VENTOLIN  HFA) 108 (90 Base) MCG/ACT inhaler, Inhale 1-2 puffs into the lungs every 4 (four) hours as needed for wheezing or shortness of breath. , Disp: , Rfl:    alendronate  (FOSAMAX ) 70 MG tablet, Take 70 mg by mouth once a week., Disp: , Rfl:    buPROPion  (WELLBUTRIN  XL) 150 MG 24 hr tablet, Take 150 mg by mouth daily. , Disp: , Rfl:    docusate sodium  (COLACE) 100 MG capsule, Take 100 mg by mouth daily., Disp: , Rfl:    famotidine  (PEPCID ) 20 MG tablet, Take 20 mg by mouth 2 (two) times daily., Disp: , Rfl:     ipratropium-albuterol  (DUONEB) 0.5-2.5 (3) MG/3ML SOLN, Take 3 mLs by nebulization every 4 (four) hours as needed. (Patient not taking: Reported on 09/28/2021), Disp: 360 mL, Rfl: 0   lisinopril  (ZESTRIL ) 20 MG tablet, Take 20 mg by mouth daily. , Disp: , Rfl:    montelukast  (SINGULAIR ) 10 MG tablet, Take 10 mg by mouth at bedtime., Disp: , Rfl:    omeprazole (PRILOSEC) 20 MG capsule, Take 20 mg by mouth 2 (two) times daily., Disp: , Rfl:    ondansetron  (ZOFRAN ) 4 MG tablet, Take 1 tablet (4 mg total) by mouth every 8 (eight) hours as needed for nausea or vomiting., Disp: 20 tablet, Rfl: 0   oxyCODONE  (OXY IR/ROXICODONE ) 5 MG immediate release tablet, Take 5 mg by mouth 3 (three) times daily as needed for moderate pain or breakthrough pain. , Disp: , Rfl:    pantoprazole  (PROTONIX ) 40 MG tablet, TAKE 1 TABLET BY MOUTH EVERY DAY, Disp: 90 tablet, Rfl: 0   pantoprazole  (PROTONIX ) 40 MG tablet, Take 1 tablet (40 mg total) by mouth daily., Disp: 30 tablet, Rfl: 1   rosuvastatin  (CRESTOR ) 20 MG tablet, Take 20 mg by mouth daily., Disp: , Rfl:    sertraline  (ZOLOFT ) 100 MG tablet, Take 100 mg by mouth daily., Disp: , Rfl:    simvastatin  (ZOCOR ) 20 MG tablet, Take 20 mg by mouth daily., Disp: , Rfl:    TRELEGY ELLIPTA 100-62.5-25 MCG/INH AEPB, Inhale 1 puff into the lungs daily. , Disp: , Rfl:   Allergies  Allergen Reactions   Aspirin     Hx of esophageal bleed     ROS  As noted in HPI.   Physical Exam  BP 116/73 (BP Location: Left Arm)   Pulse 73   Temp 97.9 F (36.6 C) (Oral)   Resp 16   Ht 5' 4 (1.626 m)   Wt 48.1 kg   SpO2 94%   BMI 18.19 kg/m   Constitutional: Well developed, well nourished, no acute distress Eyes:  EOMI, conjunctiva normal bilaterally HENT: Normocephalic, atraumatic,mucus membranes moist Respiratory: Normal inspiratory effort Cardiovascular: Normal rate GI: nondistended skin: No rash, skin intact Musculoskeletal: Left hip.  Tenderness over the greater  trochanter.  No tenderness along the femur.   pain with passive abduction/adduction of leg. pain with int/ext rotation hip.  Flexion/extension knee WNL. Knee joint NT.  Weakness left hip.  Sensation to LT intact. DP 2+  Neurologic: Alert & oriented x 3, no focal neuro deficits Psychiatric: Speech and behavior appropriate   ED Course   Medications - No data to display  Orders Placed This Encounter  Procedures   DG Hip Unilat With Pelvis 2-3 Views Left    Standing Status:   Standing    Number of Occurrences:   1    Symptom/Reason for Exam:   Fall at home [[id number]]  No results found for this or any previous visit (from the past 24 hours). DG Hip Unilat With Pelvis 2-3 Views Left Result Date: 10/24/2023 CLINICAL DATA:  Fall at home. EXAM: DG HIP (WITH OR WITHOUT PELVIS) 2-3V*L* COMPARISON:  None Available. FINDINGS: Hips are located. No evidence of pelvic fracture or sacral fracture. Dedicated view of the LEFT hip demonstrates a lucency along the lateral margin of the greater trochanter present on two views. Femoral neck is intact. IMPRESSION: Concern for nondisplaced  LEFT greater trochanter femur fracture. Electronically Signed   By: Jackquline Boxer M.D.   On: 10/24/2023 12:31    ED Clinical Impression  1. Closed nondisplaced fracture of greater trochanter of left femur, initial encounter [facility])      ED Assessment/Plan       Reviewed imaging independently.  Nondisplaced fracture of the left greater trochanter.  See radiology report for full details.  Discussed case with Dr. Ezra, orthopedics on-call.  He advises urgent MRI to be done today and if it cannot be done here, then she is to be transferred to the ER to have this done to rule out through and through fracture that may need emergent repair.  [place] to the emergency department for MRI per orthopedics request and possible specialty consultation if necessary.  Patient is stable to go private vehicle.  Discussed rationale  for transfer to the emergency department with patient and family member.  They agree to go.   No orders of the defined types were placed in this encounter.     *This clinic note was created using Dragon dictation software. Therefore, there may be occasional mistakes despite careful proofreading.  ?    Van Knee, MD 10/24/23 1323

## 2023-10-24 NOTE — ED Notes (Signed)
 Patient is being discharged from the Urgent Care and sent to the Emergency Department via POV . Per Dr. Van, patient is in need of higher level of care due to Left Hip Fracture. Patient is aware and verbalizes understanding of plan of care.  Vitals:   10/24/23 1117  BP: 116/73  Pulse: 73  Resp: 16  Temp: 97.9 F (36.6 C)  SpO2: 94%

## 2023-10-24 NOTE — ED Triage Notes (Signed)
 Pt c/o L hip pain x1 wk d/t fall. States pain radiates down leg. She fell on her driveway. Has tried tylenol  & oxy w/minor relief.

## 2023-10-24 NOTE — ED Notes (Signed)
 Patient is being discharged from the Urgent Care and sent to the Emergency Department via POV . Per Dr.Mortenson, patient is in need of higher level of care due to hip fx. Patient is aware and verbalizes understanding of plan of care.  Vitals:   10/24/23 1117  BP: 116/73  Pulse: 73  Resp: 16  Temp: 97.9 F (36.6 C)  SpO2: 94%

## 2023-10-24 NOTE — ED Triage Notes (Signed)
 Pt sent to ED from UC for left hip fx.  Pt reports fall one week ago. Sent for MRI

## 2023-10-24 NOTE — ED Notes (Addendum)
 Received call from [facility] in MRI who reports patient's foot gave out with ambulation. Pt was caught by staff and did not hit floor or head.

## 2023-10-24 NOTE — ED Notes (Signed)
 Pt on 2L O2 via [place] at baseline.

## 2023-10-24 NOTE — Discharge Instructions (Addendum)
 I spoke with Dr. Ezra, the orthopedic surgeon on-call.  He recommends that you have an MRI of your hip today to make sure that the fracture has not gone all the way through the bone.  If it has gone all the way through, then it may need repair.  Please go to the emergency department to have this done and for orthopedic consultation if necessary

## 2023-10-24 NOTE — ED Provider Notes (Addendum)
 3:37 PM Assumed care for off going team.   Blood pressure 102/71, pulse 81, temperature 98 F (36.7 C), resp. rate 18, SpO2 94%.  See their HPI for full report but in brief pending MRI  Patient reporting increasing pain after MRI so patient was given a dose of fentanyl   IMPRESSION: 1. Acute nondisplaced intertrochanteric left hip fracture. 2. Intramuscular hematoma along the left posterior flank.  Called and Messaged Ortho and pt will be admitted to hospital. Npo at midnight       Ernest Ronal BRAVO, MD 10/24/23 2313    Ernest Ronal BRAVO, MD 10/24/23 2314    Ernest Ronal BRAVO, MD 10/24/23 [phone]

## 2023-10-25 ENCOUNTER — Inpatient Hospital Stay: Admitting: Anesthesiology

## 2023-10-25 ENCOUNTER — Encounter: Payer: Self-pay | Admitting: Internal Medicine

## 2023-10-25 ENCOUNTER — Encounter
Admission: EM | Disposition: A | Discharge status: Home Health | Payer: Self-pay | Source: Home / Self Care | Attending: Internal Medicine

## 2023-10-25 ENCOUNTER — Inpatient Hospital Stay

## 2023-10-25 ENCOUNTER — Other Ambulatory Visit: Payer: Self-pay

## 2023-10-25 DIAGNOSIS — Z86012 Personal history of benign carcinoid tumor: Secondary | ICD-10-CM | POA: Insufficient documentation

## 2023-10-25 DIAGNOSIS — L89312 Pressure ulcer of right buttock, stage 2: Secondary | ICD-10-CM | POA: Diagnosis present

## 2023-10-25 DIAGNOSIS — K219 Gastro-esophageal reflux disease without esophagitis: Secondary | ICD-10-CM | POA: Diagnosis present

## 2023-10-25 DIAGNOSIS — S7012XA Contusion of left thigh, initial encounter: Secondary | ICD-10-CM

## 2023-10-25 DIAGNOSIS — Z90710 Acquired absence of both cervix and uterus: Secondary | ICD-10-CM | POA: Diagnosis not present

## 2023-10-25 DIAGNOSIS — S50819A Abrasion of unspecified forearm, initial encounter: Secondary | ICD-10-CM | POA: Diagnosis present

## 2023-10-25 DIAGNOSIS — Z79899 Other long term (current) drug therapy: Secondary | ICD-10-CM | POA: Diagnosis not present

## 2023-10-25 DIAGNOSIS — Z681 Body mass index (BMI) 19 or less, adult: Secondary | ICD-10-CM

## 2023-10-25 DIAGNOSIS — I10 Essential (primary) hypertension: Secondary | ICD-10-CM | POA: Diagnosis present

## 2023-10-25 DIAGNOSIS — S72142A Displaced intertrochanteric fracture of left femur, initial encounter for closed fracture: Secondary | ICD-10-CM

## 2023-10-25 DIAGNOSIS — Z87891 Personal history of nicotine dependence: Secondary | ICD-10-CM | POA: Diagnosis not present

## 2023-10-25 DIAGNOSIS — Z8249 Family history of ischemic heart disease and other diseases of the circulatory system: Secondary | ICD-10-CM | POA: Diagnosis not present

## 2023-10-25 DIAGNOSIS — S72145A Nondisplaced intertrochanteric fracture of left femur, initial encounter for closed fracture: Secondary | ICD-10-CM | POA: Diagnosis present

## 2023-10-25 DIAGNOSIS — Z8711 Personal history of peptic ulcer disease: Secondary | ICD-10-CM

## 2023-10-25 DIAGNOSIS — Z8719 Personal history of other diseases of the digestive system: Secondary | ICD-10-CM | POA: Diagnosis not present

## 2023-10-25 DIAGNOSIS — M159 Polyosteoarthritis, unspecified: Secondary | ICD-10-CM | POA: Diagnosis present

## 2023-10-25 DIAGNOSIS — Z7983 Long term (current) use of bisphosphonates: Secondary | ICD-10-CM | POA: Diagnosis not present

## 2023-10-25 DIAGNOSIS — W19XXXA Unspecified fall, initial encounter: Secondary | ICD-10-CM | POA: Diagnosis present

## 2023-10-25 DIAGNOSIS — J9611 Chronic respiratory failure with hypoxia: Secondary | ICD-10-CM | POA: Diagnosis present

## 2023-10-25 DIAGNOSIS — G4730 Sleep apnea, unspecified: Secondary | ICD-10-CM | POA: Diagnosis present

## 2023-10-25 DIAGNOSIS — I712 Thoracic aortic aneurysm, without rupture, unspecified: Secondary | ICD-10-CM | POA: Diagnosis present

## 2023-10-25 DIAGNOSIS — Z85038 Personal history of other malignant neoplasm of large intestine: Secondary | ICD-10-CM | POA: Diagnosis not present

## 2023-10-25 DIAGNOSIS — I714 Abdominal aortic aneurysm, without rupture, unspecified: Secondary | ICD-10-CM | POA: Diagnosis present

## 2023-10-25 DIAGNOSIS — E43 Unspecified severe protein-calorie malnutrition: Secondary | ICD-10-CM | POA: Diagnosis present

## 2023-10-25 DIAGNOSIS — J4489 Other specified chronic obstructive pulmonary disease: Secondary | ICD-10-CM | POA: Diagnosis present

## 2023-10-25 DIAGNOSIS — L89322 Pressure ulcer of left buttock, stage 2: Secondary | ICD-10-CM | POA: Diagnosis present

## 2023-10-25 HISTORY — PX: INTRAMEDULLARY (IM) NAIL INTERTROCHANTERIC: SHX5875

## 2023-10-25 LAB — CBC
HCT: 37.2 % (ref 36.0–46.0)
Hemoglobin: 11.8 g/dL — ABNORMAL LOW (ref 12.0–15.0)
MCH: 29.4 pg (ref 26.0–34.0)
MCHC: 31.7 g/dL (ref 30.0–36.0)
MCV: 92.5 fL (ref 80.0–100.0)
Platelets: 171 K/uL (ref 150–400)
RBC: 4.02 MIL/uL (ref 3.87–5.11)
RDW: 14.1 % (ref 11.5–15.5)
WBC: 7.1 K/uL (ref 4.0–10.5)
nRBC: 0 % (ref 0.0–0.2)

## 2023-10-25 LAB — TYPE AND SCREEN
ABO/RH(D): A POS
Antibody Screen: NEGATIVE

## 2023-10-25 LAB — D-DIMER, QUANTITATIVE: D-Dimer, Quant: 0.7 ug{FEU}/mL — ABNORMAL HIGH (ref 0.00–0.50)

## 2023-10-25 SURGERY — FIXATION, FRACTURE, INTERTROCHANTERIC, WITH INTRAMEDULLARY ROD
Anesthesia: General | Site: Hip | Laterality: Left

## 2023-10-25 MED ORDER — BUDESON-GLYCOPYRROL-FORMOTEROL 160-9-4.8 MCG/ACT IN AERO
2.0000 | INHALATION_SPRAY | Freq: Two times a day (BID) | RESPIRATORY_TRACT | Status: DC
  Administered 2023-10-25 – 2023-10-27 (×5): 2 via RESPIRATORY_TRACT
  Filled 2023-10-25: qty 5.9

## 2023-10-25 MED ORDER — ACETAMINOPHEN 10 MG/ML IV SOLN
INTRAVENOUS | Status: DC | PRN
Start: 2023-10-25 — End: 2023-10-25
  Administered 2023-10-25: 1000 mg via INTRAVENOUS

## 2023-10-25 MED ORDER — ACETAMINOPHEN 10 MG/ML IV SOLN: 1000.0000 mg | Freq: Once | INTRAVENOUS | Status: DC | PRN

## 2023-10-25 MED ORDER — ACETAMINOPHEN 10 MG/ML IV SOLN
INTRAVENOUS | Status: AC
  Filled 2023-10-25: qty 100

## 2023-10-25 MED ORDER — VITAMIN C 500 MG PO TABS
500.0000 mg | ORAL_TABLET | Freq: Two times a day (BID) | ORAL | Status: DC
  Administered 2023-10-25 – 2023-10-27 (×4): 500 mg via ORAL
  Filled 2023-10-25 (×4): qty 1

## 2023-10-25 MED ORDER — NICOTINE 14 MG/24HR TD PT24
14.0000 mg | MEDICATED_PATCH | Freq: Every day | TRANSDERMAL | Status: DC
  Administered 2023-10-25 – 2023-10-27 (×3): 14 mg via TRANSDERMAL
  Filled 2023-10-25 (×3): qty 1

## 2023-10-25 MED ORDER — TRANEXAMIC ACID-NACL 1000-0.7 MG/100ML-% IV SOLN
1000.0000 mg | INTRAVENOUS | Status: AC
  Administered 2023-10-25: 1000 mg via INTRAVENOUS

## 2023-10-25 MED ORDER — PHENYLEPHRINE 80 MCG/ML (10ML) SYRINGE FOR IV PUSH (FOR BLOOD PRESSURE SUPPORT)
PREFILLED_SYRINGE | INTRAVENOUS | Status: AC
Start: 2023-10-25 — End: 2023-10-25
  Filled 2023-10-25: qty 10

## 2023-10-25 MED ORDER — LISINOPRIL 20 MG PO TABS
20.0000 mg | ORAL_TABLET | Freq: Every day | ORAL | Status: DC
Start: 2023-10-25 — End: 2023-10-27
  Filled 2023-10-25 (×2): qty 1

## 2023-10-25 MED ORDER — BUPIVACAINE LIPOSOME 1.3 % IJ SUSP
INTRAMUSCULAR | Status: AC
  Filled 2023-10-25: qty 20

## 2023-10-25 MED ORDER — LIDOCAINE HCL (CARDIAC) PF 100 MG/5ML IV SOSY
PREFILLED_SYRINGE | INTRAVENOUS | Status: DC | PRN
  Administered 2023-10-25: 60 mg via INTRAVENOUS

## 2023-10-25 MED ORDER — DEXAMETHASONE SODIUM PHOSPHATE 10 MG/ML IJ SOLN
INTRAMUSCULAR | Status: AC
  Filled 2023-10-25: qty 1

## 2023-10-25 MED ORDER — ROCURONIUM BROMIDE 10 MG/ML (PF) SYRINGE
PREFILLED_SYRINGE | INTRAVENOUS | Status: AC
  Filled 2023-10-25: qty 10

## 2023-10-25 MED ORDER — ONDANSETRON HCL 4 MG/2ML IJ SOLN
INTRAMUSCULAR | Status: DC | PRN
  Administered 2023-10-25: 4 mg via INTRAVENOUS

## 2023-10-25 MED ORDER — BUPIVACAINE HCL (PF) 0.25 % IJ SOLN
INTRAMUSCULAR | Status: AC
  Filled 2023-10-25: qty 30

## 2023-10-25 MED ORDER — 0.9 % SODIUM CHLORIDE (POUR BTL) OPTIME
TOPICAL | Status: DC | PRN
  Administered 2023-10-25: 500 mL

## 2023-10-25 MED ORDER — FENTANYL CITRATE (PF) 100 MCG/2ML IJ SOLN
25.0000 ug | INTRAMUSCULAR | Status: DC | PRN
  Administered 2023-10-25: 25 ug via INTRAVENOUS

## 2023-10-25 MED ORDER — ONDANSETRON HCL 4 MG/2ML IJ SOLN
INTRAMUSCULAR | Status: AC
Start: 2023-10-25 — End: 2023-10-25
  Filled 2023-10-25: qty 2

## 2023-10-25 MED ORDER — FENTANYL CITRATE (PF) 100 MCG/2ML IJ SOLN
INTRAMUSCULAR | Status: AC
  Filled 2023-10-25: qty 2

## 2023-10-25 MED ORDER — DOCUSATE SODIUM 100 MG PO CAPS
100.0000 mg | ORAL_CAPSULE | Freq: Every day | ORAL | Status: DC
  Administered 2023-10-25 – 2023-10-27 (×3): 100 mg via ORAL
  Filled 2023-10-25 (×3): qty 1

## 2023-10-25 MED ORDER — BUPROPION HCL ER (XL) 150 MG PO TB24
150.0000 mg | ORAL_TABLET | Freq: Every day | ORAL | Status: DC
  Administered 2023-10-25 – 2023-10-27 (×3): 150 mg via ORAL
  Filled 2023-10-25 (×3): qty 1

## 2023-10-25 MED ORDER — BUPIVACAINE LIPOSOME 1.3 % IJ SUSP
INTRAMUSCULAR | Status: DC | PRN
  Administered 2023-10-25: 20 mL

## 2023-10-25 MED ORDER — CEFAZOLIN SODIUM-DEXTROSE 2-4 GM/100ML-% IV SOLN
2.0000 g | Freq: Three times a day (TID) | INTRAVENOUS | Status: AC
  Administered 2023-10-25 (×3): 2 g via INTRAVENOUS
  Filled 2023-10-25 (×2): qty 100

## 2023-10-25 MED ORDER — OXYCODONE HCL 5 MG PO TABS
5.0000 mg | ORAL_TABLET | Freq: Three times a day (TID) | ORAL | Status: DC | PRN
Start: 2023-10-25 — End: 2023-10-27
  Administered 2023-10-25 (×2): 5 mg via ORAL
  Filled 2023-10-25 (×2): qty 1

## 2023-10-25 MED ORDER — METHOCARBAMOL 1000 MG/10ML IJ SOLN: 500.0000 mg | Freq: Four times a day (QID) | INTRAMUSCULAR | Status: DC | PRN

## 2023-10-25 MED ORDER — FENTANYL CITRATE (PF) 100 MCG/2ML IJ SOLN
INTRAMUSCULAR | Status: DC | PRN
  Administered 2023-10-25: 25 ug via INTRAVENOUS
  Administered 2023-10-25: 50 ug via INTRAVENOUS

## 2023-10-25 MED ORDER — PHENYLEPHRINE 80 MCG/ML (10ML) SYRINGE FOR IV PUSH (FOR BLOOD PRESSURE SUPPORT)
PREFILLED_SYRINGE | INTRAVENOUS | Status: DC | PRN
  Administered 2023-10-25 (×5): 80 ug via INTRAVENOUS
  Administered 2023-10-25: 160 ug via INTRAVENOUS
  Administered 2023-10-25 (×2): 80 ug via INTRAVENOUS

## 2023-10-25 MED ORDER — LIDOCAINE HCL (PF) 2 % IJ SOLN
INTRAMUSCULAR | Status: AC
  Filled 2023-10-25: qty 5

## 2023-10-25 MED ORDER — ALBUTEROL SULFATE (2.5 MG/3ML) 0.083% IN NEBU: 2.5000 mg | INHALATION_SOLUTION | RESPIRATORY_TRACT | Status: DC | PRN

## 2023-10-25 MED ORDER — ADULT MULTIVITAMIN W/MINERALS CH
1.0000 | ORAL_TABLET | Freq: Every day | ORAL | Status: DC
Start: 2023-10-25 — End: 2023-10-27
  Administered 2023-10-25 – 2023-10-27 (×3): 1 via ORAL
  Filled 2023-10-25 (×3): qty 1

## 2023-10-25 MED ORDER — METHOCARBAMOL 500 MG PO TABS
500.0000 mg | ORAL_TABLET | Freq: Four times a day (QID) | ORAL | Status: DC | PRN
  Administered 2023-10-25: 500 mg via ORAL
  Filled 2023-10-25: qty 1

## 2023-10-25 MED ORDER — KETOROLAC TROMETHAMINE 15 MG/ML IJ SOLN
15.0000 mg | Freq: Four times a day (QID) | INTRAMUSCULAR | Status: DC | PRN
  Administered 2023-10-25 – 2023-10-27 (×5): 15 mg via INTRAVENOUS
  Filled 2023-10-25 (×5): qty 1

## 2023-10-25 MED ORDER — PROPOFOL 10 MG/ML IV BOLUS
INTRAVENOUS | Status: DC | PRN
Start: 2023-10-25 — End: 2023-10-25
  Administered 2023-10-25: 140 mg via INTRAVENOUS

## 2023-10-25 MED ORDER — ROSUVASTATIN CALCIUM 20 MG PO TABS
20.0000 mg | ORAL_TABLET | Freq: Every day | ORAL | Status: DC
  Administered 2023-10-25 – 2023-10-27 (×3): 20 mg via ORAL
  Filled 2023-10-25: qty 1
  Filled 2023-10-25: qty 2
  Filled 2023-10-25: qty 1

## 2023-10-25 MED ORDER — ENSURE PLUS HIGH PROTEIN PO LIQD
237.0000 mL | Freq: Three times a day (TID) | ORAL | Status: DC
  Administered 2023-10-25 – 2023-10-26 (×4): 237 mL via ORAL

## 2023-10-25 MED ORDER — LACTATED RINGERS IV SOLN: INTRAVENOUS | Status: DC | PRN

## 2023-10-25 MED ORDER — SUGAMMADEX SODIUM 200 MG/2ML IV SOLN
INTRAVENOUS | Status: DC | PRN
  Administered 2023-10-25: 200 mg via INTRAVENOUS

## 2023-10-25 MED ORDER — PANTOPRAZOLE SODIUM 40 MG PO TBEC
40.0000 mg | DELAYED_RELEASE_TABLET | Freq: Every day | ORAL | Status: DC
  Administered 2023-10-25 – 2023-10-27 (×3): 40 mg via ORAL
  Filled 2023-10-25 (×3): qty 1

## 2023-10-25 MED ORDER — ROCURONIUM BROMIDE 100 MG/10ML IV SOLN
INTRAVENOUS | Status: DC | PRN
  Administered 2023-10-25: 50 mg via INTRAVENOUS

## 2023-10-25 MED ORDER — CEFAZOLIN SODIUM-DEXTROSE 2-4 GM/100ML-% IV SOLN
INTRAVENOUS | Status: AC
  Filled 2023-10-25: qty 100

## 2023-10-25 MED ORDER — EPHEDRINE SULFATE-NACL 50-0.9 MG/10ML-% IV SOSY
PREFILLED_SYRINGE | INTRAVENOUS | Status: DC | PRN
  Administered 2023-10-25 (×2): 5 mg via INTRAVENOUS
  Administered 2023-10-25: 10 mg via INTRAVENOUS
  Administered 2023-10-25: 5 mg via INTRAVENOUS

## 2023-10-25 MED ORDER — BUPIVACAINE LIPOSOME 1.3 % IJ SUSP
INTRAMUSCULAR | Status: AC
Start: 2023-10-25 — End: 2023-10-25
  Filled 2023-10-25: qty 10

## 2023-10-25 MED ORDER — EPHEDRINE 5 MG/ML INJ
INTRAVENOUS | Status: AC
  Filled 2023-10-25: qty 5

## 2023-10-25 MED ORDER — OXYCODONE HCL 5 MG/5ML PO SOLN: 5.0000 mg | Freq: Once | ORAL | Status: DC | PRN

## 2023-10-25 MED ORDER — MORPHINE SULFATE (PF) 2 MG/ML IV SOLN
2.0000 mg | INTRAVENOUS | Status: DC | PRN
  Administered 2023-10-25 (×2): 2 mg via INTRAVENOUS
  Filled 2023-10-25 (×3): qty 1

## 2023-10-25 MED ORDER — BUPIVACAINE HCL (PF) 0.5 % IJ SOLN
INTRAMUSCULAR | Status: AC
  Filled 2023-10-25: qty 30

## 2023-10-25 MED ORDER — GERHARDT'S BUTT CREAM
TOPICAL_CREAM | Freq: Three times a day (TID) | CUTANEOUS | Status: DC
  Filled 2023-10-25: qty 60

## 2023-10-25 MED ORDER — OXYCODONE HCL 5 MG PO TABS: 5.0000 mg | ORAL_TABLET | Freq: Once | ORAL | Status: DC | PRN

## 2023-10-25 MED ORDER — TRANEXAMIC ACID-NACL 1000-0.7 MG/100ML-% IV SOLN
INTRAVENOUS | Status: AC
  Filled 2023-10-25: qty 100

## 2023-10-25 MED ORDER — ENOXAPARIN SODIUM 40 MG/0.4ML IJ SOSY
40.0000 mg | PREFILLED_SYRINGE | INTRAMUSCULAR | Status: DC
  Administered 2023-10-26 – 2023-10-27 (×2): 40 mg via SUBCUTANEOUS
  Filled 2023-10-25 (×2): qty 0.4

## 2023-10-25 MED ORDER — DEXAMETHASONE SODIUM PHOSPHATE 10 MG/ML IJ SOLN
INTRAMUSCULAR | Status: DC | PRN
  Administered 2023-10-25: 10 mg via INTRAVENOUS

## 2023-10-25 MED ORDER — FAMOTIDINE 20 MG PO TABS
20.0000 mg | ORAL_TABLET | Freq: Two times a day (BID) | ORAL | Status: DC
Start: 2023-10-25 — End: 2023-10-27
  Administered 2023-10-25 – 2023-10-27 (×5): 20 mg via ORAL
  Filled 2023-10-25 (×5): qty 1

## 2023-10-25 MED ORDER — SERTRALINE HCL 50 MG PO TABS
100.0000 mg | ORAL_TABLET | Freq: Every day | ORAL | Status: DC
  Administered 2023-10-25 – 2023-10-27 (×3): 100 mg via ORAL
  Filled 2023-10-25 (×3): qty 2

## 2023-10-25 MED ORDER — ZINC SULFATE 220 (50 ZN) MG PO CAPS
220.0000 mg | ORAL_CAPSULE | Freq: Every day | ORAL | Status: DC
Start: 2023-10-25 — End: 2023-11-08
  Administered 2023-10-25 – 2023-10-27 (×3): 220 mg via ORAL
  Filled 2023-10-25 (×3): qty 1

## 2023-10-25 MED ORDER — DROPERIDOL 2.5 MG/ML IJ SOLN: 0.6250 mg | Freq: Once | INTRAMUSCULAR | Status: DC | PRN

## 2023-10-25 SURGICAL SUPPLY — 38 items
BIT DRILL AO GAMMA 4.2X340 (BIT) IMPLANT
BNDG COHESIVE 6X5 TAN ST LF (GAUZE/BANDAGES/DRESSINGS) ×2 IMPLANT
CHLORAPREP W/TINT 26 (MISCELLANEOUS) ×1 IMPLANT
DRAPE C-ARM XRAY 36X54 (DRAPES) ×1 IMPLANT
DRAPE C-ARMOR (DRAPES) IMPLANT
DRAPE SHEET LG 3/4 BI-LAMINATE (DRAPES) ×1 IMPLANT
DRSG OPSITE POSTOP 3X4 (GAUZE/BANDAGES/DRESSINGS) IMPLANT
DRSG OPSITE POSTOP 4X6 (GAUZE/BANDAGES/DRESSINGS) IMPLANT
ELECT CAUTERY BLADE 6.4 (BLADE) IMPLANT
ELECTRODE REM PT RTRN 9FT ADLT (ELECTROSURGICAL) IMPLANT
GLOVE BIOGEL PI IND STRL 8 (GLOVE) ×1 IMPLANT
GLOVE SURG SYN 7.5 PF PI (GLOVE) ×2 IMPLANT
GOWN SRG LRG LVL 4 IMPRV REINF (GOWNS) ×1 IMPLANT
HANDLE YANKAUER SUCT OPEN TIP (MISCELLANEOUS) ×1 IMPLANT
KIT PATIENT CARE HANA TABLE (KITS) ×1 IMPLANT
KIT TURNOVER CYSTO (KITS) ×1 IMPLANT
KWIRE 3.2X450M STR (WIRE) IMPLANT
MANIFOLD NEPTUNE II (INSTRUMENTS) ×1 IMPLANT
MAT ABSORB FLUID 56X50 GRAY (MISCELLANEOUS) ×1 IMPLANT
NAIL KIT TROCH 10X170X125 (Nail) IMPLANT
NDL HYPO 21X1.5 SAFETY (NEEDLE) ×1 IMPLANT
NEEDLE HYPO 21X1.5 SAFETY (NEEDLE) ×1 IMPLANT
NS IRRIG 500ML POUR BTL (IV SOLUTION) ×1 IMPLANT
PACK HIP COMPR (MISCELLANEOUS) ×1 IMPLANT
PAD ARMBOARD POSITIONER FOAM (MISCELLANEOUS) ×1 IMPLANT
PENCIL SMOKE EVACUATOR (MISCELLANEOUS) ×1 IMPLANT
PIN GUIDE DRIL TIP 2.8X300 STE (PIN) IMPLANT
SCREW LAG GAMMA 3 95MM (Screw) IMPLANT
SCREW LOCKING T2 F/T 5MMX40MM (Screw) IMPLANT
SCREW LOCKING T2 F/T 5X37.5MM (Screw) IMPLANT
SLEEVE SCD COMPRESS KNEE MED (STOCKING) IMPLANT
SUT VIC AB 1 CT1 36 (SUTURE) ×1 IMPLANT
SUT VIC AB 2-0 CT1 (SUTURE) IMPLANT
SUT VIC AB 2-0 CT2 27 (SUTURE) ×1 IMPLANT
SUTURE STRATA SPIR 4-0 18 (SUTURE) ×1 IMPLANT
SYR 20ML LL LF (SYRINGE) ×1 IMPLANT
TRAP FLUID SMOKE EVACUATOR (MISCELLANEOUS) IMPLANT
WATER STERILE IRR 1000ML POUR (IV SOLUTION) ×1 IMPLANT

## 2023-10-25 NOTE — Assessment & Plan Note (Signed)
 N.p.o. Pain control Orthopedic consult for further management

## 2023-10-25 NOTE — Assessment & Plan Note (Addendum)
 Traumatic, secondary to fall Hemoglobin stable Continue to monitor

## 2023-10-25 NOTE — Assessment & Plan Note (Signed)
Consider nutritionist evaluation ?

## 2023-10-25 NOTE — Anesthesia Preprocedure Evaluation (Signed)
 Anesthesia Evaluation  Patient identified by MRN, date of birth, ID band Patient awake    Reviewed: Allergy & Precautions, H&P , NPO status , Patient's Chart, lab work & pertinent test results, reviewed documented beta blocker date and time   Airway Mallampati: II  TM Distance: >3 FB Neck ROM: full    Dental  (+) Teeth Intact   Pulmonary neg shortness of breath, asthma , sleep apnea , COPD,  COPD inhaler, neg recent URI, former smoker   Pulmonary exam normal        Cardiovascular Exercise Tolerance: Poor hypertension, On Medications negative cardio ROS Normal cardiovascular exam Rhythm:regular Rate:Normal     Neuro/Psych  Neuromuscular disease  negative psych ROS   GI/Hepatic Neg liver ROS,GERD  Medicated,,  Endo/Other  negative endocrine ROS    Renal/GU negative Renal ROS  negative genitourinary   Musculoskeletal   Abdominal   Peds  Hematology  (+) Blood dyscrasia, anemia   Anesthesia Other Findings Past Medical History: No date: AAA (abdominal aortic aneurysm) No date: Arthritis     Comment:  knees, elbows No date: Asthma No date: Colon cancer (HCC)     Comment:  colon No date: COPD (chronic obstructive pulmonary disease) (HCC) No date: GERD (gastroesophageal reflux disease) No date: Hypertension No date: Sleep apnea Past Surgical History: No date: ABDOMINAL HYSTERECTOMY No date: APPENDECTOMY 05/28/2019: CATARACT EXTRACTION W/PHACO; Left     Comment:  Procedure: CATARACT EXTRACTION PHACO AND INTRAOCULAR               LENS PLACEMENT (IOC) LEFT VISION BLUE 31.19 02:18.2;                Surgeon: Jaye Fallow, MD;  Location: [facility];  Service: Ophthalmology;  Laterality: Left; 06/18/2019: CATARACT EXTRACTION W/PHACO; Right     Comment:  Procedure: CATARACT EXTRACTION PHACO AND INTRAOCULAR               LENS PLACEMENT (IOC) RIGHT 34.59  02:31.7;  Surgeon:                Jaye Fallow, MD;  Location: [facility];                Service: Ophthalmology;  Laterality: Right;  sleep apnea 01/12/2019: ESOPHAGOGASTRODUODENOSCOPY; N/A     Comment:  Procedure: ESOPHAGOGASTRODUODENOSCOPY (EGD);  Surgeon:               Janalyn Keene NOVAK, MD;  Location: [facility];                Service: Endoscopy;  Laterality: N/A; 01/14/2019: ESOPHAGOGASTRODUODENOSCOPY (EGD) WITH PROPOFOL ; N/A     Comment:  Procedure: ESOPHAGOGASTRODUODENOSCOPY (EGD) WITH               PROPOFOL ;  Surgeon: Saintclair Jasper, MD;  Location: [facility]               ENDOSCOPY;  Service: Gastroenterology;  Laterality: N/A; 01/14/2019: HEMOSTASIS CLIP PLACEMENT     Comment:  Procedure: HEMOSTASIS CLIP PLACEMENT;  Surgeon: Saintclair Jasper, MD;  Location: [facility] ENDOSCOPY;  Service:               Gastroenterology;; No date: KNEE SURGERY; Right     Comment:  fracture repair No date: TONSILLECTOMY No date: TUBAL LIGATION No date: TUMOR EXCISION     Comment:  colon BMI    Body Mass Index: 18.19 kg/m     Reproductive/Obstetrics negative OB ROS                              Anesthesia Physical Anesthesia Plan  ASA: 3 and emergent  Anesthesia Plan: General ETT   Post-op Pain Management:    Induction:   PONV Risk Score and Plan: 4 or greater and TIVA  Airway Management Planned:   Additional Equipment:   Intra-op Plan:   Post-operative Plan:   Informed Consent: I have reviewed the patients History and Physical, chart, labs and discussed the procedure including the risks, benefits and alternatives for the proposed anesthesia with the patient or authorized representative who has indicated his/her understanding and acceptance.     Dental Advisory Given  Plan Discussed with: CRNA  Anesthesia Plan Comments:         Anesthesia Quick Evaluation

## 2023-10-25 NOTE — Assessment & Plan Note (Signed)
 History of nicotine  dependence Patient was tachypneic to 22 with O2 sat 88% on room air requiring O2 at 2 L Patient is not tachycardic so low suspicion for PE Will get D-dimer, though likely to be elevated in view of recent fracture

## 2023-10-25 NOTE — Progress Notes (Addendum)
  Progress Note   Patient: Alicia Holder FMW:[id number] DOB: November 20, 1950 DOA: 10/24/2023     0 DOS: the patient was seen and examined on 10/25/2023  Same day Admission Note:  She was admitted with left hip fracture after a mechanical fall in her driveway. She lives at home with her daughter. Complaining of moderate left hip pain. She has been kept NPO since midnight for left hip surgery. Case discussed with Dr Ezra. She does use prn oxygen at home.She is currently on 2 L oxygen. Denies chest pain or shortness of breath. PT/OT evaluations after surgery and she may need SNF placement.  Author: Deliliah Room, MD 10/25/2023 9:59 AM  For on call review [email or web address].

## 2023-10-25 NOTE — Op Note (Addendum)
 DATE OF SURGERY: 10/25/2023  PREOPERATIVE DIAGNOSIS: Left greater trochanter fracture with intertrochanteric extension  POSTOPERATIVE DIAGNOSIS: Left greater trochanter fracture with intertrochanteric extension  PROCEDURE: Intramedullary nailing of Left femur with cephalomedullary device (CPT [phone])  SURGEON: Jackquline CANDIE Barrack, MD  ANESTHESIA: General  EBL: 100 cc  IVF: per anesthesia record  COMPONENTS:  Implant Name Type Inv. Item Serial No. Manufacturer Lot No. LRB No. Used Action  NAIL KIT TROCH [id number] - [id number] Nail NAIL KIT TROCH [id number]  [employer] [id number] Left 1 Implanted  SCREW LAG GAMMA 3 - [id number] Screw SCREW LAG GAMMA 3  [employer] [id number] Left 1 Implanted  SCREW LOCKING T2 F/T 5X37.5MM - [id number] Screw SCREW LOCKING T2 F/T 5X37.  [employer]  Left 1 Implanted and Explanted  SCREW LOCKING T2 F/T 5MMX40MM - [id number] Screw SCREW LOCKING T2 F/T 5MMX40MM  [employer] [id number] Left 1 Implanted      INDICATIONS: Alicia Holder is a 73 y.o. female who sustained a greater trochanter fracture with intertrochanteric extension after a fall. Risks and benefits of intramedullary nailing were explained to the patient. Risks include but are not limited to bleeding, infection, injury to tissues, nerves, vessels, nonunion/malunion, hardware failure, limb length discrepancy/hip rotation mismatch and risks of anesthesia including death. The patient understand these risks, have completed an informed consent, and wish to proceed.   PROCEDURE:  The patient was brought into the operating room. After administering anesthesia, the patient was placed in the supine position on the Hana table. The uninjured leg was placed in an extended position while the injured lower extremity was placed in longitudinal traction. Given the nondisplaced nature of the fracture, no reduction maneuver was required. Adequate AP and lateral fluoroscopic images  were verified before prepping. The lateral aspect of the right hip and thigh were prepped with ChloraPrep solution before being draped sterilely. Preoperative IV antibiotics and TXA were administered. A timeout was performed to verify the appropriate surgical site, patient, and procedure.    The greater trochanter was identified and an approximately 6 cm incision was made about 3 fingerbreadths above the tip of the greater trochanter. The incision was carried down through the subcutaneous tissues to expose the gluteal fascia. This was split the length of the incision, providing access to the tip of the trochanter. Under fluoroscopic guidance, a guidewire was drilled through the tip of the trochanter into the proximal metaphysis to the level of the lesser trochanter. After verifying its position fluoroscopically in AP and lateral projections, it was overreamed with the opening reamer to the level of the lesser trochanter. The [employer] Gamma Nail was selected and advanced to the appropriate depth as verified fluoroscopically.    The guide system for the lag screw was positioned and advanced through an approximately 3 cm stab incision over the lateral aspect of the proximal femur. The guidewire was drilled up through the femoral nail and into the femoral neck to rest within 5 mm of subchondral bone. After verifying its position in the femoral neck and head in both AP and lateral projections, the guidewire was measured and appropriate sized lag screw was selected. The guidewire was overreamed to the appropriate depth before the lag screw was inserted and advanced to the appropriate depth as verified fluoroscopically in AP and lateral projections. The set screw was tightened and then untightened a quarter turn to allow for compression. Again, the adequacy of hardware position and fracture reduction was verified fluoroscopically in AP and lateral projections. Using the  distal screw targeting guide, a small incision was  made and blunt dissection was used to place the guide on the lateral aspect of the femur. A screw of the appropriate length was then placed in the static position.   The wounds were irrigated thoroughly with sterile saline solution. Local anesthetic was injected into the wounds. Deep fascia was closed with 0-Vicryl. The subcutaneous tissues were closed using 2-0 Vicryl interrupted sutures. The skin was closed using staples. Sterile occlusive dressings were applied to all wounds. The patient was then transferred to the recovery room in satisfactory condition.   POSTOPERATIVE PLAN: The patient will be WBAT on the operative extremity. Lovenox  40mg /day x 4 weeks to start on POD0. Perioperative IV antibiotics x 24 hours. PT/OT on POD#1.

## 2023-10-25 NOTE — Assessment & Plan Note (Signed)
 No acute issues suspected

## 2023-10-25 NOTE — Plan of Care (Signed)
   Problem: Education: Goal: Knowledge of General Education information will improve Description: Including pain rating scale, medication(s)/side effects and non-pharmacologic comfort measures Outcome: Progressing   Problem: Clinical Measurements: Goal: Respiratory complications will improve Outcome: Progressing

## 2023-10-25 NOTE — Consult Note (Signed)
 ORTHOPAEDIC CONSULTATION  REQUESTING PHYSICIAN: Dino Antu, MD  Chief Complaint:   Left hip pain  History of Present Illness: Alicia Holder is a 73 y.o. female with COPD, HTN, GERD who had a GLF 10-11 days ago.  The patient had left hip pain but was attempting to ambulate despite this.  The patient ambulates with a cain at baseline.  The patient lives with her daughter. As the pain worsened and her ability to ambulate decreased, she decided to present to an urgent care.  X-rays in the urgent care showed a greater trochanteric fracture of the left hip. She was then transferred to the ED where an MRI was obtained showing a nondisplaced intertrochanteric fracture.  Past Medical History:  Diagnosis Date   AAA (abdominal aortic aneurysm)    Arthritis    knees, elbows   Asthma    Colon cancer (HCC)    colon   COPD (chronic obstructive pulmonary disease) (HCC)    GERD (gastroesophageal reflux disease)    Hypertension    Sleep apnea    Past Surgical History:  Procedure Laterality Date   ABDOMINAL HYSTERECTOMY     APPENDECTOMY     CATARACT EXTRACTION W/PHACO Left 05/28/2019   Procedure: CATARACT EXTRACTION PHACO AND INTRAOCULAR LENS PLACEMENT (IOC) LEFT VISION BLUE 31.19 02:18.2;  Surgeon: Jaye Fallow, MD;  Location: [facility];  Service: Ophthalmology;  Laterality: Left;   CATARACT EXTRACTION W/PHACO Right 06/18/2019   Procedure: CATARACT EXTRACTION PHACO AND INTRAOCULAR LENS PLACEMENT (IOC) RIGHT 34.59  02:31.7;  Surgeon: Jaye Fallow, MD;  Location: [facility];  Service: Ophthalmology;  Laterality: Right;  sleep apnea   ESOPHAGOGASTRODUODENOSCOPY N/A 01/12/2019   Procedure: ESOPHAGOGASTRODUODENOSCOPY (EGD);  Surgeon: Janalyn Keene NOVAK, MD;  Location: [facility] ENDOSCOPY;  Service: Endoscopy;  Laterality: N/A;   ESOPHAGOGASTRODUODENOSCOPY (EGD) WITH PROPOFOL  N/A 01/14/2019   Procedure:  ESOPHAGOGASTRODUODENOSCOPY (EGD) WITH PROPOFOL ;  Surgeon: Saintclair Jasper, MD;  Location: [facility] ENDOSCOPY;  Service: Gastroenterology;  Laterality: N/A;   HEMOSTASIS CLIP PLACEMENT  01/14/2019   Procedure: HEMOSTASIS CLIP PLACEMENT;  Surgeon: Saintclair Jasper, MD;  Location: [facility];  Service: Gastroenterology;;   KNEE SURGERY Right    fracture repair   TONSILLECTOMY     TUBAL LIGATION     TUMOR EXCISION     colon   Social History   Socioeconomic History   Marital status: Widowed    Spouse name: Not on file   Number of children: Not on file   Years of education: Not on file   Highest education level: Not on file  Occupational History   Not on file  Tobacco Use   Smoking status: Former    Current packs/day: 0.00    Average packs/day: 1 pack/day for 41.0 years (41.0 ttl pk-yrs)    Types: Cigarettes    Start date: 12/31/1977    Quit date: 01/01/2019    Years since quitting: 4.8   Smokeless tobacco: Never  Vaping Use   Vaping status: Never Used  Substance and Sexual Activity   Alcohol use: Yes    Comment: rare- couple times per year   Drug use: Never   Sexual activity: Not Currently    Birth control/protection: Surgical    Comment: Hysterectomy  Other Topics Concern   Not on file  Social History Narrative   Not on file   Social Drivers of Health   Financial Resource Strain: Not on file  Food Insecurity: No Food Insecurity (10/25/2023)   Hunger Vital Sign    Worried About  Running Out of Food in the Last Year: Never true    Ran Out of Food in the Last Year: Never true  Transportation Needs: No Transportation Needs (10/25/2023)   PRAPARE - Administrator, Civil Service (Medical): No    Lack of Transportation (Non-Medical): No  Physical Activity: Not on file  Stress: Not on file  Social Connections: Moderately Isolated (10/25/2023)   Social Connection and Isolation Panel    Frequency of Communication with Friends and Family: More than three times a week    Frequency  of Social Gatherings with Friends and Family: More than three times a week    Attends Religious Services: 1 to 4 times per year    Active Member of [employer] or Organizations: No    Attends Banker Meetings: Never    Marital Status: Widowed   Family History  Problem Relation Age of Onset   CAD Mother    CAD Father    Allergies  Allergen Reactions   Aspirin     Hx of esophageal bleed   Prior to Admission medications   Medication Sig Start Date End Date Taking? Authorizing Provider  acetaminophen  (TYLENOL ) 500 MG tablet Take 1,000 mg by mouth every 6 (six) hours as needed for mild pain.   Yes [provider]  albuterol  (VENTOLIN  HFA) 108 (90 Base) MCG/ACT inhaler Inhale 1-2 puffs into the lungs every 4 (four) hours as needed for wheezing or shortness of breath.  07/09/18  Yes [provider]  alendronate  (FOSAMAX ) 70 MG tablet Take 70 mg by mouth once a week. 07/26/17  Yes [provider]  buPROPion  (WELLBUTRIN  XL) 150 MG 24 hr tablet Take 150 mg by mouth daily.  02/15/19  Yes [provider]  Cholecalciferol (VITAMIN D3) 1.25 MG (50000 UT) CAPS VITAMIN D3 1.25 MG (50000 UT) CAPS 09/28/23  Yes [provider]  docusate sodium  (COLACE) 100 MG capsule Take 100 mg by mouth daily.   Yes [provider]  famotidine  (PEPCID ) 20 MG tablet Take 20 mg by mouth 2 (two) times daily.   Yes [provider]  lisinopril  (ZESTRIL ) 20 MG tablet Take 20 mg by mouth daily.    Yes [provider]  omeprazole (PRILOSEC) 20 MG capsule Take 20 mg by mouth 2 (two) times daily. 09/02/19  Yes [provider]  ondansetron  (ZOFRAN ) 4 MG tablet Take 1 tablet (4 mg total) by mouth every 8 (eight) hours as needed for nausea or vomiting. 11/07/21  Yes Floy Roberts, MD  oxyCODONE  (OXY IR/ROXICODONE ) 5 MG immediate release tablet Take 5 mg by mouth 3 (three) times daily as needed for moderate pain or breakthrough pain.    Yes [provider]  pantoprazole  (PROTONIX ) 40 MG tablet TAKE 1 TABLET BY MOUTH EVERY DAY 05/30/19  Yes Corcoran, Melissa C, MD  rosuvastatin  (CRESTOR ) 20 MG tablet Take 20 mg by mouth daily.   Yes [provider]  sertraline  (ZOLOFT ) 100 MG tablet Take 100 mg by mouth daily. 05/22/19  Yes [provider]  simvastatin  (ZOCOR ) 20 MG tablet Take 20 mg by mouth daily. 11/14/16  Yes [provider]  TRELEGY ELLIPTA 100-62.5-25 MCG/INH AEPB Inhale 1 puff into the lungs daily.  02/20/19  Yes [provider]  ipratropium-albuterol  (DUONEB) 0.5-2.5 (3) MG/3ML SOLN Take 3 mLs by nebulization every 4 (four) hours as needed. Patient not taking: Reported on 09/28/2021 01/05/19   Patel, Sona, MD  montelukast  (SINGULAIR ) 10 MG tablet Take 10  mg by mouth at bedtime. Patient not taking: Reported on 10/25/2023 11/14/16   [provider]  pantoprazole  (PROTONIX ) 40 MG tablet Take 1 tablet (40 mg total) by mouth daily. 11/07/21 11/07/22  Floy Roberts, MD   Recent Labs    10/24/23 1430 10/25/23 0543  WBC 9.2 7.1  HGB 14.8 11.8*  HCT 47.1* 37.2  PLT 224 171  K 4.1  --   CL 102  --   CO2 28  --   BUN 17  --   CREATININE 0.79  --   GLUCOSE 130*  --   CALCIUM  9.1  --   INR 1.0  --    MR FEMUR LEFT WO CONTRAST Result Date: 10/24/2023 EXAM: MR LEFT LOWER EXTREMITY GENERIC BONE WITHOUT INTRAVENOUS CONTRAST 10/24/2023 07:52:26 PM TECHNIQUE: Multiplanar magnetic resonance images of the left lower extremity generic bone without intravenous contrast. COMPARISON: Left hip radiograph earlier today. CLINICAL HISTORY: Concern for nondisplaced questionable left femur fracture. Eval for occult femur fracture. 73 y.o. female past medical history significant for COPD on intermittent 2 L home oxygen, who presents to the emergency department with hip pain. States that she fell when ambulating in her driveway approximately 1 week ago. Ongoing pain to her left hip despite trying to do  exercises for it. States that she was seen at urgent care today and told that she had a questionable fracture and to come to the emergency department. Denies being on anticoagulation. No history of hip injury. FINDINGS: SOFT TISSUES: Intramuscular hematoma with soft tissue swelling along the left posterior flank (series 11/image 11). JOINTS: Unremarkable. No dislocation or significant effusion. BONES: Nondisplaced intertrochanteric left hip fracture (series 7/images 8-12) with associated marrow edema, acute. IMPRESSION: 1. Acute nondisplaced intertrochanteric left hip fracture. 2. Intramuscular hematoma along the left posterior flank. Electronically signed by: Pinkie Pebbles MD 10/24/2023 10:55 PM EDT RP Workstation: [id number]   DG Chest Portable 1 View Result Date: 10/24/2023 CLINICAL DATA:  Hypoxia, fall. EXAM: PORTABLE CHEST 1 VIEW COMPARISON:  Chest x-ray 11/07/2021.  CT of the chest 05/27/2021. FINDINGS: The cardiomediastinal silhouette there stable given differences in technique. There is increased soft tissue density in the left paratracheal region measuring up to 3 cm in thickness. This correlates to left thyroid  exophytic nodule seen on prior chest CT. The aorta is ectatic. Moderate-sized hiatal hernia is again seen. Heart size is within normal limits. There is no focal lung infiltrate, pleural effusion or pneumothorax. There is a healed right-sided rib fracture. No acute fractures are seen. IMPRESSION: 1. No acute cardiopulmonary process. 2. Increased soft tissue density in the left paratracheal region correlates to left thyroid  exophytic nodule seen on prior chest CT. 3. Moderate-sized hiatal hernia. Electronically Signed   By: Greig Pique M.D.   On: 10/24/2023 15:34   DG Hip Unilat With Pelvis 2-3 Views Left Result Date: 10/24/2023 CLINICAL DATA:  Fall at home. EXAM: DG HIP (WITH OR WITHOUT PELVIS) 2-3V*L* COMPARISON:  None Available. FINDINGS: Hips are located. No evidence of pelvic fracture  or sacral fracture. Dedicated view of the LEFT hip demonstrates a lucency along the lateral margin of the greater trochanter present on two views. Femoral neck is intact. IMPRESSION: Concern for nondisplaced  LEFT greater trochanter femur fracture. Electronically Signed   By: Jackquline Boxer M.D.   On: 10/24/2023 12:31    Positive ROS: All other systems have been reviewed and were otherwise negative with the exception of those mentioned in the HPI and as above.  Physical Exam: BP  99/66   Pulse 61   Temp 98.2 F (36.8 C) (Temporal)   Resp 18   Ht 5' 4 (1.626 m)   Wt 48.1 kg   SpO2 94%   BMI 18.19 kg/m  General:  Alert, no acute distress Psychiatric:  Patient is competent for consent with normal mood and affect    Orthopaedic Exam:  Left Lower Extremity: + DF/PF/EHL SILT grossly over foot Foot wwp +Log roll/axial load   Imaging:  As above: Radiographs- minimally displaced GT fracture of the left hip; MRI- nondisplaced intertroch fracture  Assessment/Plan: Alicia Holder is a 72 y.o. female with a left intertrochanteric hip fracture  1. I discussed the various treatment options including both surgical and non-surgical management of the fracture with the patient. We discussed the high risk of perioperative complications due to patient's age, and other co-morbidities. After discussion of risks, benefits, and alternatives to surgery, the family and/or patient were in agreement to proceed with surgery. The goals of surgery would be to provide adequate pain relief and allow for mobilization. Plan for surgery is left hip cephalomedullary nailing today, 10/25/2023. 2. NPO until OR 3. Plan for PT, mobilization, DVT ppx postoperatively   Jackquline GORMAN Barrack 10/25/2023 11:46 AM

## 2023-10-25 NOTE — Assessment & Plan Note (Addendum)
 Chronic respiratory failure with hypoxia--on as needed home O2 Patient mildly tachypneic and O2 sat 88% on room air in the ED Not acutely exacerbated DuoNebs as needed and continue home inhalers Supplemental O2

## 2023-10-25 NOTE — Transfer of Care (Signed)
 Immediate Anesthesia Transfer of Care Note  Patient: Alicia Holder  Procedure(s) Performed: FIXATION, FRACTURE, INTERTROCHANTERIC, WITH INTRAMEDULLARY ROD (Left: Hip)  Patient Location: PACU  Anesthesia Type:General  Level of Consciousness: awake and drowsy  Airway & Oxygen Therapy: Patient Spontanous Breathing and Patient connected to face mask oxygen  Post-op Assessment: Report given to RN and Post -op Vital signs reviewed and stable  Post vital signs: Reviewed and stable  Last Vitals:  Vitals Value Taken Time  BP 123/76 10/25/23 14:25  Temp 28f 1425  Pulse 79 10/25/23 14:26  Resp 14 10/25/23 14:26  SpO2 99 % 10/25/23 14:26  Vitals shown include unfiled device data.  Last Pain:  Vitals:   10/25/23 1136  TempSrc: Temporal  PainSc: 1          Complications: No notable events documented.

## 2023-10-25 NOTE — Assessment & Plan Note (Signed)
 No acute issues Does not appear to follow with oncology

## 2023-10-25 NOTE — Anesthesia Procedure Notes (Signed)
 Procedure Name: Intubation Date/Time: 10/25/2023 12:32 PM  Performed by: Trudy Rankin LABOR, CRNAPre-anesthesia Checklist: Patient identified, Patient being monitored, Timeout performed, Emergency Drugs available and Suction available Patient Re-evaluated:Patient Re-evaluated prior to induction Oxygen Delivery Method: Circle System Utilized Preoxygenation: Pre-oxygenation with 100% oxygen Induction Type: IV induction and Rapid sequence Laryngoscope Size: Mac, 3 and McGrath Grade View: Grade I Tube type: Oral Tube size: 7.0 mm Number of attempts: 1 Airway Equipment and Method: Stylet Placement Confirmation: ETT inserted through vocal cords under direct vision, positive ETCO2 and breath sounds checked- equal and bilateral Secured at: 21 cm Tube secured with: Tape Dental Injury: Teeth and Oropharynx as per pre-operative assessment

## 2023-10-25 NOTE — Consult Note (Addendum)
 WOC Nurse Consult Note: Reason for Consult: wounds to buttocks  Wound type: 1.  Stage 2 Pressure Injury B medial buttocks 2.   Moisture Associated Skin Damage to buttocks/coccyx  ICD-10 CM Codes for Irritant Dermatitis L24A2 - Due to fecal, urinary or dual incontinence  Pressure Injury POA: yes Wound bed: red moist  Drainage (amount, consistency, odor) see nursing flowsheet  Periwound: erythema  Dressing procedure/placement/frequency: Cleanse buttocks with soap and water, dry and apply Gerhardt's Butt Cream 3 times a day and prn soiling.  Cover sacrum/upper buttocks with silicone foam or ABD pad and tape whichever is preferred.   POC discussed with bedside nurse. WOC team will not follow. Re-consult if further needs arise.   Thank you,    Powell Bar MSN, RN-BC, [employer]

## 2023-10-25 NOTE — H&P (Signed)
 History and Physical    Patient: Alicia Holder FMW:[id number] DOB: 10/14/1950 DOA: 10/24/2023 DOS: the patient was seen and examined on 10/25/2023 PCP: [facility]  Patient coming from: Home  Chief Complaint:  Chief Complaint  Patient presents with   Hip Pain    HPI: Alicia Holder is a 73 y.o. female with medical history significant for carcinoid tumor intestine s/p resection 2009, hemorrhagic shock from bleeding esophageal ulcer 2020, thoracic aortic aneurysm, hypertension, COPD on as needed home O2, being admitted with hip fracture resulting from an accidental fall a week prior on her driveway.  She suffered no additional injuries except for abrasions on her forearm.  Since the fall she was able to ambulate albeit with some difficulty  however symptoms progressively worsened in spite of treatment with home Tylenol , oxycodone , exercises and heat.  Today she went to urgent care where x-ray showed a possible fracture and she was sent to the ED for further evaluation. Patient has otherwise been in her usual state of health with no chest pain, shortness of breath, palpitations or lightheadedness. In the ED, she was noted to be mildly hypoxic at 88% for which she was placed on 2 L with improvement to the mid 90s, mild tachypnea to 22 with otherwise normal vitals.  Labs unremarkable.  Troponin normal EKG showing sinus at 82 MRI of the left hip showed an acute nondisplaced intertrochanteric fracture as well as an intramuscular hematoma along the left posterior flank. Chest x-ray nonacute The ED provider spoke with orthopedist, Dr. Ezra who will take patient to the OR on 10/25/2023. Admission requested   Past Medical History:  Diagnosis Date   AAA (abdominal aortic aneurysm)    Arthritis    knees, elbows   Asthma    Colon cancer (HCC)    colon   COPD (chronic obstructive pulmonary disease) (HCC)    GERD (gastroesophageal reflux disease)    Hypertension    Sleep  apnea    Past Surgical History:  Procedure Laterality Date   ABDOMINAL HYSTERECTOMY     APPENDECTOMY     CATARACT EXTRACTION W/PHACO Left 05/28/2019   Procedure: CATARACT EXTRACTION PHACO AND INTRAOCULAR LENS PLACEMENT (IOC) LEFT VISION BLUE 31.19 02:18.2;  Surgeon: Jaye Fallow, MD;  Location: [facility];  Service: Ophthalmology;  Laterality: Left;   CATARACT EXTRACTION W/PHACO Right 06/18/2019   Procedure: CATARACT EXTRACTION PHACO AND INTRAOCULAR LENS PLACEMENT (IOC) RIGHT 34.59  02:31.7;  Surgeon: Jaye Fallow, MD;  Location: [facility];  Service: Ophthalmology;  Laterality: Right;  sleep apnea   ESOPHAGOGASTRODUODENOSCOPY N/A 01/12/2019   Procedure: ESOPHAGOGASTRODUODENOSCOPY (EGD);  Surgeon: Janalyn Keene NOVAK, MD;  Location: [facility] END[facility]COPY;  Service: Endoscopy;  Laterality: N/A;   ESOPHAGOGASTRODUODENOSCOPY (EGD) WITH PROPOFOL  N/A 01/14/2019   Procedure: ESOPHAGOGASTRODUODENOSCOPY (EGD) WITH PROPOFOL ;  Surgeon: Saintclair Jasper, MD;  Location: [facility] ENDOSCOPY;  Service: Gastroenterology;  Laterality: N/A;   HEMOSTASIS CLIP PLACEMENT  01/14/2019   Procedure: HEMOSTASIS CLIP PLACEMENT;  Surgeon: Saintclair Jasper, MD;  Location: [facility] ENDOSCOPY;  Service: Gastroenterology;;   KNEE SURGERY Right    fracture repair   TONSILLECTOMY     TUBAL LIGATION     TUMOR EXCISION     colon   Social History:  reports that she quit smoking about 4 years ago. Her smoking use included cigarettes. She started smoking about 45 years ago. She has a 41 pack-year smoking history. She has never used smokeless tobacco. She reports current alcohol use. She reports that she does not  use drugs.  Allergies  Allergen Reactions   Aspirin     Hx of esophageal bleed    Family History  Problem Relation Age of Onset   CAD Mother    CAD Father     Prior to Admission medications   Medication Sig Start Date End Date Taking? Authorizing Provider  acetaminophen  (TYLENOL ) 500 MG tablet Take 1,000 mg  by mouth every 6 (six) hours as needed for mild pain.   Yes [provider]  albuterol  (VENTOLIN  HFA) 108 (90 Base) MCG/ACT inhaler Inhale 1-2 puffs into the lungs every 4 (four) hours as needed for wheezing or shortness of breath.  07/09/18  Yes [provider]  alendronate  (FOSAMAX ) 70 MG tablet Take 70 mg by mouth once a week. 07/26/17  Yes [provider]  buPROPion  (WELLBUTRIN  XL) 150 MG 24 hr tablet Take 150 mg by mouth daily.  02/15/19  Yes [provider]  Cholecalciferol (VITAMIN D3) 1.25 MG (50000 UT) CAPS VITAMIN D3 1.25 MG (50000 UT) CAPS 09/28/23  Yes [provider]  docusate sodium  (COLACE) 100 MG capsule Take 100 mg by mouth daily.   Yes [provider]  famotidine  (PEPCID ) 20 MG tablet Take 20 mg by mouth 2 (two) times daily.   Yes [provider]  lisinopril  (ZESTRIL ) 20 MG tablet Take 20 mg by mouth daily.    Yes [provider]  omeprazole (PRILOSEC) 20 MG capsule Take 20 mg by mouth 2 (two) times daily. 09/02/19  Yes [provider]  ondansetron  (ZOFRAN ) 4 MG tablet Take 1 tablet (4 mg total) by mouth every 8 (eight) hours as needed for nausea or vomiting. 11/07/21  Yes Floy Roberts, MD  oxyCODONE  (OXY IR/ROXICODONE ) 5 MG immediate release tablet Take 5 mg by mouth 3 (three) times daily as needed for moderate pain or breakthrough pain.    Yes [provider]  pantoprazole  (PROTONIX ) 40 MG tablet TAKE 1 TABLET BY MOUTH EVERY DAY 05/30/19  Yes Corcoran, Melissa C, MD  rosuvastatin  (CRESTOR ) 20 MG tablet Take 20 mg by mouth daily.   Yes [provider]  sertraline  (ZOLOFT ) 100 MG tablet Take 100 mg by mouth daily. 05/22/19  Yes [provider]  simvastatin  (ZOCOR ) 20 MG tablet Take 20 mg by mouth daily. 11/14/16  Yes [provider]  TRELEGY ELLIPTA 100-62.5-25 MCG/INH AEPB Inhale 1 puff into the lungs daily.  02/20/19  Yes [provider]  ipratropium-albuterol   (DUONEB) 0.5-2.5 (3) MG/3ML SOLN Take 3 mLs by nebulization every 4 (four) hours as needed. Patient not taking: Reported on 09/28/2021 01/05/19   Patel, Sona, MD  montelukast  (SINGULAIR ) 10 MG tablet Take 10 mg by mouth at bedtime. Patient not taking: Reported on 10/25/2023 11/14/16   [provider]  pantoprazole  (PROTONIX ) 40 MG tablet Take 1 tablet (40 mg total) by mouth daily. 11/07/21 11/07/22  Floy Roberts, MD    Physical Exam: Vitals:   10/24/23 1843 10/24/23 1900 10/24/23 2200 10/24/23 2315  BP:  121/78  138/80  Pulse:  65 61 64  Resp:  19 19 (!) 22  Temp: 97.6 F (36.4 C)   97.9 F (36.6 C)  TempSrc: Oral   Oral  SpO2:  94% 96% 94%   Physical Exam Vitals and nursing note reviewed.  Constitutional:      General: She is not in acute distress. HENT:     Head: Normocephalic and atraumatic.  Cardiovascular:     Rate and Rhythm: Normal rate and regular  rhythm.     Heart sounds: Normal heart sounds.  Pulmonary:     Effort: Pulmonary effort is normal.     Breath sounds: Normal breath sounds.  Abdominal:     Palpations: Abdomen is soft.     Tenderness: There is no abdominal tenderness.  Neurological:     Mental Status: Mental status is at baseline.     Labs on Admission: I have personally reviewed following labs and imaging studies  CBC: Recent Labs  Lab 10/24/23 1430  WBC 9.2  NEUTROABS 6.9  HGB 14.8  HCT 47.1*  MCV 93.3  PLT 224   Basic Metabolic Panel: Recent Labs  Lab 10/24/23 1430  NA 139  K 4.1  CL 102  CO2 28  GLUCOSE 130*  BUN 17  CREATININE 0.79  CALCIUM  9.1   GFR: Estimated Creatinine Clearance: 46.8 mL/min (by C-G formula based on SCr of 0.79 mg/dL). Liver Function Tests: No results for input(s): AST, ALT, ALKPHOS, BILITOT, PROT, ALBUMIN in the last 168 hours. No results for input(s): LIPASE, AMYLASE in the last 168 hours. No results for input(s): AMMONIA in the last 168 hours. Coagulation Profile: Recent  Labs  Lab 10/24/23 1430  INR 1.0   Cardiac Enzymes: No results for input(s): CKTOTAL, CKMB, CKMBINDEX, TROPONINI in the last 168 hours. BNP (last 3 results) No results for input(s): PROBNP in the last 8760 hours. HbA1C: No results for input(s): HGBA1C in the last 72 hours. CBG: No results for input(s): GLUCAP in the last 168 hours. Lipid Profile: No results for input(s): CHOL, HDL, LDLCALC, TRIG, CHOLHDL, LDLDIRECT in the last 72 hours. Thyroid  Function Tests: No results for input(s): TSH, T4TOTAL, FREET4, T3FREE, THYROIDAB in the last 72 hours. Anemia Panel: No results for input(s): VITAMINB12, FOLATE, FERRITIN, TIBC, IRON , RETICCTPCT in the last 72 hours. Urine analysis:    Component Value Date/Time   COLORURINE YELLOW (A) 02/21/2019 1121   APPEARANCEUR CLEAR (A) 02/21/2019 1121   LABSPEC 1.006 02/21/2019 1121   PHURINE 6.0 02/21/2019 1121   GLUCOSEU NEGATIVE 02/21/2019 1121   HGBUR NEGATIVE 02/21/2019 1121   BILIRUBINUR NEGATIVE 02/21/2019 1121   KETONESUR NEGATIVE 02/21/2019 1121   PROTEINUR NEGATIVE 02/21/2019 1121   NITRITE NEGATIVE 02/21/2019 1121   LEUKOCYTESUR SMALL (A) 02/21/2019 1121    Radiological Exams on Admission: MR FEMUR LEFT WO CONTRAST Result Date: 10/24/2023 EXAM: MR LEFT LOWER EXTREMITY GENERIC BONE WITHOUT INTRAVENOUS CONTRAST 10/24/2023 07:52:26 PM TECHNIQUE: Multiplanar magnetic resonance images of the left lower extremity generic bone without intravenous contrast. COMPARISON: Left hip radiograph earlier today. CLINICAL HISTORY: Concern for nondisplaced questionable left femur fracture. Eval for occult femur fracture. 73 y.o. female past medical history significant for COPD on intermittent 2 L home oxygen, who presents to the emergency department with hip pain. States that she fell when ambulating in her driveway approximately 1 week ago. Ongoing pain to her left hip despite trying to do exercises for it.  States that she was seen at urgent care today and told that she had a questionable fracture and to come to the emergency department. Denies being on anticoagulation. No history of hip injury. FINDINGS: SOFT TISSUES: Intramuscular hematoma with soft tissue swelling along the left posterior flank (series 11/image 11). JOINTS: Unremarkable. No dislocation or significant effusion. BONES: Nondisplaced intertrochanteric left hip fracture (series 7/images 8-12) with associated marrow edema, acute. IMPRESSION: 1. Acute nondisplaced intertrochanteric left hip fracture. 2. Intramuscular hematoma along the left posterior flank. Electronically signed by: Pinkie Pebbles MD 10/24/2023 10:55 PM EDT  RP Workstation: [id number]   DG Chest Portable 1 View Result Date: 10/24/2023 CLINICAL DATA:  Hypoxia, fall. EXAM: PORTABLE CHEST 1 VIEW COMPARISON:  Chest x-ray 11/07/2021.  CT of the chest 05/27/2021. FINDINGS: The cardiomediastinal silhouette there stable given differences in technique. There is increased soft tissue density in the left paratracheal region measuring up to 3 cm in thickness. This correlates to left thyroid  exophytic nodule seen on prior chest CT. The aorta is ectatic. Moderate-sized hiatal hernia is again seen. Heart size is within normal limits. There is no focal lung infiltrate, pleural effusion or pneumothorax. There is a healed right-sided rib fracture. No acute fractures are seen. IMPRESSION: 1. No acute cardiopulmonary process. 2. Increased soft tissue density in the left paratracheal region correlates to left thyroid  exophytic nodule seen on prior chest CT. 3. Moderate-sized hiatal hernia. Electronically Signed   By: Greig Pique M.D.   On: 10/24/2023 15:34   DG Hip Unilat With Pelvis 2-3 Views Left Result Date: 10/24/2023 CLINICAL DATA:  Fall at home. EXAM: DG HIP (WITH OR WITHOUT PELVIS) 2-3V*L* COMPARISON:  None Available. FINDINGS: Hips are located. No evidence of pelvic fracture or sacral  fracture. Dedicated view of the LEFT hip demonstrates a lucency along the lateral margin of the greater trochanter present on two views. Femoral neck is intact. IMPRESSION: Concern for nondisplaced  LEFT greater trochanter femur fracture. Electronically Signed   By: Jackquline Boxer M.D.   On: 10/24/2023 12:31   Data Reviewed for HPI: Relevant notes from primary care and specialist visits, past discharge summaries as available in EHR, including Care Everywhere. Prior diagnostic testing as pertinent to current admission diagnoses Updated medications and problem lists for reconciliation ED course, including vitals, labs, imaging, treatment and response to treatment Triage notes, nursing and pharmacy notes and ED provider's notes Notable results as noted above in HPI      Assessment and Plan: * Closed displaced intertrochanteric fracture of left femur, initial encounter (HCC) N.p.o. Pain control Orthopedic consult for further management  Hematoma muscle, left posterior flank muscle,  initial encounter Traumatic, secondary to fall Hemoglobin stable Continue to monitor  COPD (chronic obstructive pulmonary disease) (HCC) Chronic respiratory failure with hypoxia--on as needed home O2 Patient mildly tachypneic and O2 sat 88% on room air in the ED Not acutely exacerbated DuoNebs as needed and continue home inhalers Supplemental O2  Underweight (BMI < 18.5) Consider nutritionist evaluation  History of bleeding peptic ulcer 2020/hemorrhagic shock No acute concerns  Personal history of  carcinoid tumor s/p resection 2009 No acute issues Does not appear to follow with oncology  Aortic aneurysm, thoracic No acute issues suspected     DVT prophylaxis: SCD  Consults: ortho, Dr Ezra  Advance Care Planning:   Code Status: Prior   Family Communication: none  Disposition Plan: Back to previous home environment  Severity of Illness: The appropriate patient status for this  patient is INPATIENT. Inpatient status is judged to be reasonable and necessary in order to provide the required intensity of service to ensure the patient's safety. The patient's presenting symptoms, physical exam findings, and initial radiographic and laboratory data in the context of their chronic comorbidities is felt to place them at high risk for further clinical deterioration. Furthermore, it is not anticipated that the patient will be medically stable for discharge from the hospital within 2 midnights of admission.   * I certify that at the point of admission it is my clinical judgment that the patient will require inpatient hospital  care spanning beyond 2 midnights from the point of admission due to high intensity of service, high risk for further deterioration and high frequency of surveillance required.*  Author: Delayne LULLA Solian, MD 10/25/2023 1:04 AM  For on call review [email or web address].

## 2023-10-25 NOTE — Progress Notes (Signed)
 Initial Nutrition Assessment  DOCUMENTATION CODES:   Severe malnutrition in context of chronic illness  INTERVENTION:   -Continue regular diet -MVI with minerals daily -Ensure Plus High Protein po TID, each supplement provides 350 kcal and 20 grams of protein  -500 mg vitamin C  BID -220 mg zinc  sulfate daily x 14 days   NUTRITION DIAGNOSIS:   Severe Malnutrition related to chronic illness (COPD) as evidenced by moderate fat depletion, severe fat depletion, moderate muscle depletion, severe muscle depletion.  GOAL:   Patient will meet greater than or equal to 90% of their needs  MONITOR:   PO intake, Supplement acceptance, Diet advancement  REASON FOR ASSESSMENT:   Consult Assessment of nutrition requirement/status, Hip fracture protocol  ASSESSMENT:   Pt with medical history significant for carcinoid tumor intestine s/p resection 2009, hemorrhagic shock from bleeding esophageal ulcer 2020, thoracic aortic aneurysm, hypertension, COPD on as needed home O2, being admitted with hip fracture resulting from an accidental fall a week prior on her driveway.  She suffered no additional injuries except for abrasions on her forearm.  Since the fall she was able to ambulate albeit with some difficulty  however symptoms progressively worsened in spite of treatment with home Tylenol , oxycodone , exercises and heat.  Pt admitted with lt femur fracture.   9/24- s/p Intramedullary nailing of Left femur with cephalomedullary device (CPT [phone])   Reviewed I/O's: +1 L x 24 hours  Per [facility] notes, pt with stage 2 pressure injury to bilateral medial buttocks and MASD to buttocks/ coccyx.   Spoke with pt at bedside, who was pleasant and in good spirits today. Pt explains that she fell in her driveway about a week PTA and tried to walk if off, but came to the hospital after encouragement of her daughter. Pt is currently NPO for surgery today.   Pt shares that she lives with her adult daughter,  who is disabled (we help take care of each other). Pt also has another daughter who lives nearby and provides support.   Pt shares that she typically does not have a great appetite and consumes several small meals. She generally consumes 3 meals per day (egg roll and salad) and 2-3 snacks (fruit). Pt grocery shops and prepares her meals herself.   Per pt, she used to weigh over 200#. She initially lost weight intentionally through diet and lifestyle modifications, but noticed a 30# wt loss over the past year due to decreased oral intake. No recent history available to confirm wt changes.   Pt with multiple missing teeth, but denies any difficulty chewing or swallowing foods or liquids.   Discussed importance of good meal and supplement intake to promote healing. Pt amenable to supplements, stating she has drank Ensure supplements int he past, but not consistently.   Medications reviewed and include colace and lovenox .   Labs reviewed.   NUTRITION - FOCUSED PHYSICAL EXAM:  Flowsheet Row Most Recent Value  Orbital Region Severe depletion  Upper Arm Region Severe depletion  Thoracic and Lumbar Region Moderate depletion  Buccal Region Moderate depletion  Temple Region Severe depletion  Clavicle Bone Region Severe depletion  Clavicle and Acromion Bone Region Severe depletion  Scapular Bone Region Severe depletion  Dorsal Hand Moderate depletion  Patellar Region Moderate depletion  Anterior Thigh Region Moderate depletion  Posterior Calf Region Moderate depletion  Edema (RD Assessment) None  Hair Reviewed  Eyes Reviewed  Mouth Reviewed  Skin Reviewed  Nails Reviewed    Diet Order:   Diet  Order             Diet regular Room service appropriate? Yes; Fluid consistency: Thin  Diet effective now                   EDUCATION NEEDS:   Education needs have been addressed  Skin:  Skin Assessment: Skin Integrity Issues: Skin Integrity Issues:: Incisions, Stage II, Other  (Comment) Stage II: sacrum Incisions: clsoed lt hip Other: MASD to buttocks/ coccyx  Last BM:  10/23/23  Height:   Ht Readings from Last 1 Encounters:  10/25/23 5' 4 (1.626 m)    Weight:   Wt Readings from Last 1 Encounters:  10/25/23 48.1 kg    Ideal Body Weight:  54.5 kg  BMI:  Body mass index is 18.19 kg/m.  Estimated Nutritional Needs:   Kcal:  1700-1900  Protein:  90-105 grams  Fluid:  1.7-1.9 L    Margery ORN, RD, LDN, CDCES Registered Dietitian III Certified Diabetes Care and Education Specialist If unable to reach this RD, please use RD Inpatient group chat on secure chat between hours of 8am-4 pm daily

## 2023-10-25 NOTE — Assessment & Plan Note (Signed)
No acute concerns 

## 2023-10-26 DIAGNOSIS — E43 Unspecified severe protein-calorie malnutrition: Secondary | ICD-10-CM | POA: Insufficient documentation

## 2023-10-26 MED ORDER — CEFAZOLIN SODIUM-DEXTROSE 2-4 GM/100ML-% IV SOLN
2.0000 g | Freq: Once | INTRAVENOUS | Status: AC
Start: 2023-10-26 — End: 2023-10-27
  Administered 2023-10-26: 2 g via INTRAVENOUS
  Filled 2023-10-26: qty 100

## 2023-10-26 MED ORDER — SODIUM CHLORIDE 0.9 % IV BOLUS
500.0000 mL | Freq: Once | INTRAVENOUS | Status: AC
  Administered 2023-10-26: 500 mL via INTRAVENOUS

## 2023-10-26 NOTE — Progress Notes (Signed)
 PROGRESS NOTE    Alicia Holder  FMW:[id number] DOB: [birth year] DOA: 10/24/2023 PCP: [facility]   Brief Narrative:   73 y.o. female with medical history significant for carcinoid tumor intestine s/p resection 2009, hemorrhagic shock from bleeding esophageal ulcer 2020, thoracic aortic aneurysm, hypertension, COPD on as needed home O2, being admitted with hip fracture resulting from an accidental fall a week prior on her driveway.  She was found to have left greater trochanter fracture with intertrochanteric extension, underwent intramedullary nailing of the left femur with cephalomedullary device, done by orthopedics on 10/25/2023.  Pending PT/OT evaluation and likely skilled nursing facility on discharge.    Assessment & Plan:  Principal Problem:   Closed displaced intertrochanteric fracture of left femur, initial encounter [facility]) Active Problems:   Hematoma muscle, left posterior flank muscle,  initial encounter   COPD (chronic obstructive pulmonary disease) (HCC)   Underweight (BMI < 18.5)   Aortic aneurysm, thoracic   Personal history of  carcinoid tumor s/p resection 2009   History of bleeding peptic ulcer 2020/hemorrhagic shock   Chronic respiratory failure with hypoxia (HCC)   Protein-calorie malnutrition, severe    Closed displaced intertrochanteric fracture of left femur, status post intramedullary nailing of the left femur with cephalomedullary device, done by orthopedics on 10/25/2023, postop day 1 As needed analgesics, pending PT/OT evaluation, may need skilled nursing facility placement. Rest of the management as per orthopedics.  Patient will need to follow-up outpatient with orthopedics   Hematoma muscle, left posterior flank muscle,  initial encounter Traumatic, secondary to fall Hemoglobin stable Continue to monitor   COPD (chronic obstructive pulmonary disease)  Chronic respiratory failure with hypoxia--on as needed home O2  Not acutely  exacerbated DuoNebs as needed and continue home inhalers Supplemental O2 to maintain an oxygen saturation between 88 and 92%   Underweight (BMI < 18.5) Encouraged oral intake Nutritionist consulted   History of bleeding peptic ulcer 2020/hemorrhagic shock No acute concerns   Personal history of  carcinoid tumor s/p resection 2009 No acute issues Does not appear to follow with oncology   Aortic aneurysm, thoracic No acute issues suspected  Disposition: She came from home.  She will likely need skilled nursing facility as she underwent left hip surgery.  PT/OT evaluations pending.  DVT prophylaxis: enoxaparin  (LOVENOX ) injection 40 mg Start: 10/26/23 0800 SCDs Start: 10/25/23 0109     Code Status: Full Code Family Communication: None at the bedside Status is: Inpatient Remains inpatient appropriate because: Hip fracture, status post hip surgery    Subjective:  No acute issues overnight.  She underwent left hip surgery yesterday.  Complaining of slight pain at the postoperative site.  Her blood pressure is on the lower side so she will be given 500 cc of normal saline bolus.  This was discussed with the patient's nurse.  Examination:  General exam: Appears calm and comfortable  Respiratory system: Clear to auscultation. Respiratory effort normal. Cardiovascular system: S1 & S2 heard, RRR. No JVD, murmurs, rubs, gallops or clicks. No pedal edema. Gastrointestinal system: Abdomen is nondistended, soft and nontender. No organomegaly or masses felt. Normal bowel sounds heard. Central nervous system: Alert and oriented. No focal neurological deficits. Extremities: Left hip dressing in place, no evidence of discharge Skin: No rashes, lesions or ulcers Psychiatry: Judgement and insight appear normal. Mood & affect appropriate.   Wound 10/25/23 0223 Pressure Injury Buttocks Left;Right;Medial;Mid;Bilateral Stage 2 -  Partial thickness loss of dermis presenting as a shallow open  injury with  a red, pink wound bed without slough. (Active)     Diet Orders (From admission, onward)     Start     Ordered   10/25/23 1542  Diet regular Room service appropriate? Yes; Fluid consistency: Thin  Diet effective now       Question Answer Comment  Room service appropriate? Yes   Fluid consistency: Thin      10/25/23 1542            Objective: Vitals:   10/25/23 2002 10/25/23 2132 10/26/23 0317 10/26/23 0818  BP: 91/60 (!) 84/62 (!) 89/61 (!) 85/57  Pulse: 77 76 67 62  Resp: 16  18 17   Temp: (!) 97.4 F (36.3 C)  97.9 F (36.6 C) (!) 97.1 F (36.2 C)  TempSrc:   Oral Oral  SpO2: 96% 96% 99% 99%  Weight:      Height:        Intake/Output Summary (Last 24 hours) at 10/26/2023 9060 Last data filed at 10/26/2023 9561 Gross per 24 hour  Intake 1385 ml  Output 600 ml  Net 785 ml   Filed Weights   10/25/23 1136  Weight: 48.1 kg    Scheduled Meds:  vitamin C   500 mg Oral BID   budesonide -glycopyrrolate -formoterol   2 puff Inhalation BID   buPROPion   150 mg Oral Daily   docusate sodium   100 mg Oral Daily   enoxaparin  (LOVENOX ) injection  40 mg Subcutaneous Q24H   famotidine   20 mg Oral BID   feeding supplement  237 mL Oral TID BM   Gerhardt's butt cream   Topical TID   lisinopril   20 mg Oral Daily   multivitamin with minerals  1 tablet Oral Daily   nicotine   14 mg Transdermal Daily   pantoprazole   40 mg Oral Daily   rosuvastatin   20 mg Oral Daily   sertraline   100 mg Oral Daily   zinc  sulfate (50mg  elemental zinc )  220 mg Oral Daily   Continuous Infusions:  Nutritional status Signs/Symptoms: moderate fat depletion, severe fat depletion, moderate muscle depletion, severe muscle depletion Interventions: Ensure Enlive (each supplement provides 350kcal and 20 grams of protein), MVI, Liberalize Diet Body mass index is 18.19 kg/m.  Data Reviewed:   CBC: Recent Labs  Lab 10/24/23 1430 10/25/23 0543  WBC 9.2 7.1  NEUTROABS 6.9  --   HGB 14.8  11.8*  HCT 47.1* 37.2  MCV 93.3 92.5  PLT 224 171   Basic Metabolic Panel: Recent Labs  Lab 10/24/23 1430  NA 139  K 4.1  CL 102  CO2 28  GLUCOSE 130*  BUN 17  CREATININE 0.79  CALCIUM  9.1   GFR: Estimated Creatinine Clearance: 46.8 mL/min (by C-G formula based on SCr of 0.79 mg/dL). Liver Function Tests: No results for input(s): AST, ALT, ALKPHOS, BILITOT, PROT, ALBUMIN in the last 168 hours. No results for input(s): LIPASE, AMYLASE in the last 168 hours. No results for input(s): AMMONIA in the last 168 hours. Coagulation Profile: Recent Labs  Lab 10/24/23 1430  INR 1.0   Cardiac Enzymes: No results for input(s): CKTOTAL, CKMB, CKMBINDEX, TROPONINI in the last 168 hours. BNP (last 3 results) No results for input(s): PROBNP in the last 8760 hours. HbA1C: No results for input(s): HGBA1C in the last 72 hours. CBG: No results for input(s): GLUCAP in the last 168 hours. Lipid Profile: No results for input(s): CHOL, HDL, LDLCALC, TRIG, CHOLHDL, LDLDIRECT in the last 72 hours. Thyroid  Function Tests: No results for input(s): TSH,  T4TOTAL, FREET4, T3FREE, THYROIDAB in the last 72 hours. Anemia Panel: No results for input(s): VITAMINB12, FOLATE, FERRITIN, TIBC, IRON , RETICCTPCT in the last 72 hours. Sepsis Labs: No results for input(s): PROCALCITON, LATICACIDVEN in the last 168 hours.  No results found for this or any previous visit (from the past 240 hours).       Radiology Studies: DG HIP UNILAT WITH PELVIS 2-3 VIEWS LEFT Result Date: 10/25/2023 CLINICAL DATA:  Elective surgery. EXAM: DG HIP (WITH OR WITHOUT PELVIS) 2-3V LEFT COMPARISON:  Preoperative imaging FINDINGS: Six fluoroscopic spot views of the femur submitted from the operating room. Intramedullary nail with trans trochanteric and distal locking screw traverse proximal femur fracture. Fluoroscopy time 2 minutes 4 seconds. Dose 20.91 mGy.  IMPRESSION: Intraoperative fluoroscopy during proximal femur fracture ORIF. Electronically Signed   By: Andrea Gasman M.D.   On: 10/25/2023 15:00   DG C-Arm 1-60 Min-No Report Result Date: 10/25/2023 Fluoroscopy was utilized by the requesting physician.  No radiographic interpretation.   DG C-Arm 1-60 Min-No Report Result Date: 10/25/2023 Fluoroscopy was utilized by the requesting physician.  No radiographic interpretation.   MR FEMUR LEFT WO CONTRAST Result Date: 10/24/2023 EXAM: MR LEFT LOWER EXTREMITY GENERIC BONE WITHOUT INTRAVENOUS CONTRAST 10/24/2023 07:52:26 PM TECHNIQUE: Multiplanar magnetic resonance images of the left lower extremity generic bone without intravenous contrast. COMPARISON: Left hip radiograph earlier today. CLINICAL HISTORY: Concern for nondisplaced questionable left femur fracture. Eval for occult femur fracture. 73 y.o. female past medical history significant for COPD on intermittent 2 L home oxygen, who presents to the emergency department with hip pain. States that she fell when ambulating in her driveway approximately 1 week ago. Ongoing pain to her left hip despite trying to do exercises for it. States that she was seen at urgent care today and told that she had a questionable fracture and to come to the emergency department. Denies being on anticoagulation. No history of hip injury. FINDINGS: SOFT TISSUES: Intramuscular hematoma with soft tissue swelling along the left posterior flank (series 11/image 11). JOINTS: Unremarkable. No dislocation or significant effusion. BONES: Nondisplaced intertrochanteric left hip fracture (series 7/images 8-12) with associated marrow edema, acute. IMPRESSION: 1. Acute nondisplaced intertrochanteric left hip fracture. 2. Intramuscular hematoma along the left posterior flank. Electronically signed by: Pinkie Pebbles MD 10/24/2023 10:55 PM EDT RP Workstation: [id number]   DG Chest Portable 1 View Result Date: 10/24/2023 CLINICAL DATA:   Hypoxia, fall. EXAM: PORTABLE CHEST 1 VIEW COMPARISON:  Chest x-ray 11/07/2021.  CT of the chest 05/27/2021. FINDINGS: The cardiomediastinal silhouette there stable given differences in technique. There is increased soft tissue density in the left paratracheal region measuring up to 3 cm in thickness. This correlates to left thyroid  exophytic nodule seen on prior chest CT. The aorta is ectatic. Moderate-sized hiatal hernia is again seen. Heart size is within normal limits. There is no focal lung infiltrate, pleural effusion or pneumothorax. There is a healed right-sided rib fracture. No acute fractures are seen. IMPRESSION: 1. No acute cardiopulmonary process. 2. Increased soft tissue density in the left paratracheal region correlates to left thyroid  exophytic nodule seen on prior chest CT. 3. Moderate-sized hiatal hernia. Electronically Signed   By: Greig Pique M.D.   On: 10/24/2023 15:34   DG Hip Unilat With Pelvis 2-3 Views Left Result Date: 10/24/2023 CLINICAL DATA:  Fall at home. EXAM: DG HIP (WITH OR WITHOUT PELVIS) 2-3V*L* COMPARISON:  None Available. FINDINGS: Hips are located. No evidence of pelvic fracture or sacral fracture. Dedicated view of  the LEFT hip demonstrates a lucency along the lateral margin of the greater trochanter present on two views. Femoral neck is intact. IMPRESSION: Concern for nondisplaced  LEFT greater trochanter femur fracture. Electronically Signed   By: Jackquline Boxer M.D.   On: 10/24/2023 12:31         LOS: 1 day   Time spent= 39 mins    Deliliah Room, MD [facility]  If 7PM-7AM, please contact night-coverage  10/26/2023, 9:39 AM

## 2023-10-26 NOTE — Progress Notes (Signed)
 Nutrition Follow-up  DOCUMENTATION CODES:   Severe malnutrition in context of chronic illness  INTERVENTION:   -Continue regular diet -Continue MVI with minerals daily -Continue Ensure Plus High Protein po TID, each supplement provides 350 kcal and 20 grams of protein  -Continue 500 mg vitamin C  BID -Continue 220 mg zinc  sulfate daily x 14 days   NUTRITION DIAGNOSIS:   Severe Malnutrition related to chronic illness (COPD) as evidenced by moderate fat depletion, severe fat depletion, moderate muscle depletion, severe muscle depletion.  Ongoing  GOAL:   Patient will meet greater than or equal to 90% of their needs  Progressing   MONITOR:   PO intake, Supplement acceptance, Diet advancement  REASON FOR ASSESSMENT:   Consult Assessment of nutrition requirement/status  ASSESSMENT:   Pt with medical history significant for carcinoid tumor intestine s/p resection 2009, hemorrhagic shock from bleeding esophageal ulcer 2020, thoracic aortic aneurysm, hypertension, COPD on as needed home O2, being admitted with hip fracture resulting from an accidental fall a week prior on her driveway.  She suffered no additional injuries except for abrasions on her forearm.  Since the fall she was able to ambulate albeit with some difficulty  however symptoms progressively worsened in spite of treatment with home Tylenol , oxycodone , exercises and heat.  9/24- s/p Intramedullary nailing of Left femur with cephalomedullary device (CPT [phone])   Reviewed I/O's: +785 ml x 24 hours  UOP: 600 ml x 24 hours   RD received consult for malnutrition evaluation. RD just evaluated pt and identified malnutrition on 10/25/23 (see not for further details).   Case discussed with RN; pt is feeling well and eating well. Meal completions 100%. Per RN, pt has been drinking Ensure supplements.   Pt awaiting PT evaluation; may require SNF at discharge.   Medications reviewed and include vitamin C , colace, protonix ,  and zinc  sulfate.   Labs reviewed.    Diet Order:   Diet Order             Diet regular Room service appropriate? Yes; Fluid consistency: Thin  Diet effective now                   EDUCATION NEEDS:   Education needs have been addressed  Skin:  Skin Assessment: Skin Integrity Issues: Skin Integrity Issues:: Incisions, Stage II, Other (Comment) Stage II: sacrum Incisions: clsoed lt hip Other: MASD to buttocks/ coccyx  Last BM:  10/24/23  Height:   Ht Readings from Last 1 Encounters:  10/25/23 5' 4 (1.626 m)    Weight:   Wt Readings from Last 1 Encounters:  10/25/23 48.1 kg    Ideal Body Weight:  54.5 kg  BMI:  Body mass index is 18.19 kg/m.  Estimated Nutritional Needs:   Kcal:  1700-1900  Protein:  90-105 grams  Fluid:  1.7-1.9 L    Margery ORN, RD, LDN, CDCES Registered Dietitian III Certified Diabetes Care and Education Specialist If unable to reach this RD, please use RD Inpatient group chat on secure chat between hours of 8am-4 pm daily

## 2023-10-26 NOTE — Evaluation (Signed)
 Occupational Therapy Evaluation Patient Details Name: Alicia Holder MRN: [id number] DOB: 04/27/1950 Today's Date: 10/26/2023   History of Present Illness   Pt is a 73 y.o. female being admitted with acute nondisplaced intertrochanteric fracture as well as an intramuscular hematoma along the left posterior flank. She is post op day 1 from L hip IM nailing. PMH of carcinoid tumor intestine s/p resection 2009, hemorrhagic shock from bleeding esophageal ulcer 2020, thoracic aortic aneurysm, hypertension, COPD on as needed home O2.     Clinical Impressions Pt was seen for OT evaluation this date. PTA, pt resides in a one level home with 2 STE with her daughter/family who she reports are available 24/7. At baseline, pt ambulates without AD inside the home and uses a quad cane for outdoor mobility. Pt denies falls other than the one leading to this admission. She is IND with ADLs/IADLs at baseline, but does not drive.  Pt presents with deficits in strength, balance, and activity tolerance affecting safe and optimal ADL completion. Pt currently requires supervision for bed mobility with increased time/effort and use of bed features. Her BP remained low throughout session with readings of 85/60, 78/57, 87/57. Pt remained asymptomatic, therefore was assisted with BSC use. She required CGA for STS from EOB and BSC, as well as for stand step pivot to [facility] using RW. She was able to take ~4-5 steps forward and turn to sit in recliner. Pt demo performance of peri-care in standing with unilateral support on RW with CGA. No LOB throughout session and pain rating of 3/10 at rest and with activity. Pt on 3L 02 via [place] at start of session, attempted wean to RA however dropped to 85-90% sp02 with faulty readings. Placed back on 1L 02 via  and maintained at 94%. Pt would benefit from skilled OT services to address noted impairments and functional limitations to maximize safety and return to PLOF. Do anticipate the need  for follow up OT services upon acute hospital DC.      If plan is discharge home, recommend the following:   A little help with walking and/or transfers;A little help with bathing/dressing/bathroom;Help with stairs or ramp for entrance;Assistance with cooking/housework     Functional Status Assessment   Patient has had a recent decline in their functional status and demonstrates the ability to make significant improvements in function in a reasonable and predictable amount of time.     Equipment Recommendations   BSC/3in1     Recommendations for Other Services         Precautions/Restrictions   Precautions Precautions: Fall Recall of Precautions/Restrictions: Intact Restrictions Weight Bearing Restrictions Per Provider Order: Yes LLE Weight Bearing Per Provider Order: Weight bearing as tolerated     Mobility Bed Mobility Overal bed mobility: Needs Assistance Bed Mobility: Supine to Sit     Supine to sit: Supervision, Used rails, HOB elevated     General bed mobility comments: increased time to scoot to EOB, however no physical assist provided with HOB slightly elevated    Transfers Overall transfer level: Needs assistance Equipment used: Rolling walker (2 wheels) Transfers: Sit to/from Stand, Bed to chair/wheelchair/BSC Sit to Stand: Contact guard assist     Step pivot transfers: Contact guard assist     General transfer comment: increased time/effort to stand from [employer], stood well from EOB to RW; able to step pivot to L side with general ease/good safety, CGA provided for safety, then took ~4 steps forward and turned to sit in recliner  Balance Overall balance assessment: Needs assistance Sitting-balance support: Feet supported Sitting balance-Leahy Scale: Good     Standing balance support: During functional activity, Single extremity supported Standing balance-Leahy Scale: Fair Standing balance comment: RW and CGA for safety                            ADL either performed or assessed with clinical judgement   ADL Overall ADL's : Needs assistance/impaired                         Toilet Transfer: Contact guard assist;BSC/3in1;Rolling walker (2 wheels) Toilet Transfer Details (indicate cue type and reason): step pivot to [facility] using RW Toileting- Clothing Manipulation and Hygiene: Sit to/from stand;Contact guard assist Toileting - Clothing Manipulation Details (indicate cue type and reason): able to perform standing anterior hygiene/peri-care with CGA     Functional mobility during ADLs: Contact guard assist;Rolling walker (2 wheels)       Vision Patient Visual Report: No change from baseline       Perception         Praxis         Pertinent Vitals/Pain Pain Assessment Pain Assessment: 0-10 Pain Score: 3  Pain Location: L hip Pain Descriptors / Indicators: Sore Pain Intervention(s): Monitored during session, Repositioned     Extremity/Trunk Assessment Upper Extremity Assessment Upper Extremity Assessment: Overall WFL for tasks assessed   Lower Extremity Assessment Lower Extremity Assessment: Generalized weakness;LLE deficits/detail LLE Deficits / Details: expected L hip weaknes s/p IM nailing       Communication Communication Communication: No apparent difficulties   Cognition Arousal: Alert Behavior During Therapy: WFL for tasks assessed/performed Cognition: No apparent impairments                               Following commands: Intact       Cueing  General Comments      BP remained low throughout session with multiple readings completed and pt asymptomatic; sp02 stable on 3L, placed on RA with drop to 85-90% requiring 1L to maintain at 94% sp02 via [place]   Exercises Other Exercises Other Exercises: Edu on role of OT in acute setting, DC recommendations and DME/AE/AD needs.   Shoulder Instructions      Home Living Family/patient expects to be  discharged to:: Private residence Living Arrangements: Children Available Help at Discharge: Family;Available 24 hours/day Type of Home: Mobile home Home Access: Stairs to enter Entrance Stairs-Number of Steps: 2 Entrance Stairs-Rails: Right;Left;Can reach both Home Layout: One level     Bathroom Shower/Tub: Producer, television/film/video: Standard     Home Equipment: Shower seat - built in;Cane - Programmer, applications (2 wheels)          Prior Functioning/Environment Prior Level of Function : Independent/Modified Independent;History of Falls (last six months)             Mobility Comments: no AD in the home, SPC outdoors ADLs Comments: IND with ADLs and IADLs, does most of the cooking    OT Problem List: Decreased strength;Decreased activity tolerance;Impaired balance (sitting and/or standing)   OT Treatment/Interventions: Self-care/ADL training;Therapeutic exercise;Therapeutic activities;Patient/family education;DME and/or AE instruction;Energy conservation;Balance training      OT Goals(Current goals can be found in the care plan section)   Acute Rehab OT Goals Patient Stated Goal: go home OT Goal Formulation: With patient  Time For Goal Achievement: 11/09/23 Potential to Achieve Goals: Good ADL Goals Pt Will Perform Lower Body Bathing: with set-up;sitting/lateral leans;sit to/from stand Pt Will Perform Lower Body Dressing: with set-up;sitting/lateral leans;sit to/from stand Pt Will Transfer to Toilet: with set-up;ambulating   OT Frequency:  Min 3X/week    Co-evaluation              AM-PAC OT 6 Clicks Daily Activity     Outcome Measure Help from another person eating meals?: None Help from another person taking care of personal grooming?: None Help from another person toileting, which includes using toliet, bedpan, or urinal?: A Little Help from another person bathing (including washing, rinsing, drying)?: A Little Help from another person to put  on and taking off regular upper body clothing?: None Help from another person to put on and taking off regular lower body clothing?: A Little 6 Click Score: 21   End of Session Equipment Utilized During Treatment: Rolling walker (2 wheels);Oxygen Nurse Communication: Mobility status  Activity Tolerance: Patient tolerated treatment well Patient left: in chair;with call bell/phone within reach;with chair alarm set  OT Visit Diagnosis: Other abnormalities of gait and mobility (R26.89);Muscle weakness (generalized) (M62.81)                Time: 8779-8754 OT Time Calculation (min): 25 min Charges:  OT General Charges $OT Visit: 1 Visit OT Evaluation $OT Eval Moderate Complexity: 1 Mod OT Treatments $Self Care/Home Management : 8-22 mins Shimeka Bacot, OTR/L 10/26/23, 1:51 PM  Brycelynn Stampley E Theophil Thivierge 10/26/2023, 1:45 PM

## 2023-10-26 NOTE — Evaluation (Signed)
 Physical Therapy Evaluation Patient Details Name: DESHAWN SKELLEY MRN: [id number] DOB: 1950-09-11 Today's Date: 10/26/2023  History of Present Illness  Pt is a 73 y.o. female being admitted with acute nondisplaced intertrochanteric fracture as well as an intramuscular hematoma along the left posterior flank. She is post op day 1 from L hip IM nailing. PMH of carcinoid tumor intestine s/p resection 2009, hemorrhagic shock from bleeding esophageal ulcer 2020, thoracic aortic aneurysm, hypertension, COPD on as needed home O2.  Clinical Impression  Pt is 73 y.o. female who is POD 1 after L ORIF. Prior to hospitalization, pt reports independence with ambulation with use of SPC for outdoors. Pt was also independent with ADLs. Pt now is CGA for ambulation using RW and for STS. Pt able to amb 160 ft and remained asymptomatic throughout session. With activity pt SpO2 remained WNL, 91% at the lowest on 1 L of O2. Pt demonstrates with deficits in strength/ROM/balance. Would benefit from skilled PT to address above deficits and promote optimal return to PLOF.         If plan is discharge home, recommend the following: A little help with bathing/dressing/bathroom;Help with stairs or ramp for entrance;Assistance with cooking/housework;A little help with walking and/or transfers   Can travel by private vehicle        Equipment Recommendations Rolling walker (2 wheels)  Recommendations for Other Services       Functional Status Assessment Patient has had a recent decline in their functional status and demonstrates the ability to make significant improvements in function in a reasonable and predictable amount of time.     Precautions / Restrictions Precautions Precautions: Fall Recall of Precautions/Restrictions: Intact Restrictions Weight Bearing Restrictions Per Provider Order: Yes LLE Weight Bearing Per Provider Order: Weight bearing as tolerated      Mobility  Bed Mobility Overal bed  mobility: Needs Assistance             General bed mobility comments: NT. PT received in recliner pre/post session    Transfers Overall transfer level: Needs assistance Equipment used: Rolling walker (2 wheels) Transfers: Sit to/from Stand, Bed to chair/wheelchair/BSC Sit to Stand: Contact guard assist   Step pivot transfers: Contact guard assist       General transfer comment: Increased time to stand from recliner to RW. Use of BUE fot lift. CGA For safety    Ambulation/Gait Ambulation/Gait assistance: Contact guard assist Gait Distance (Feet): 160 Feet Assistive device: Rolling walker (2 wheels) Gait Pattern/deviations: WFL(Within Functional Limits) Gait velocity: decreased     General Gait Details: Step through pattern with ambulating with RW. 1 standing rest break to assess O2.  Stairs            Wheelchair Mobility     Tilt Bed    Modified Rankin (Stroke Patients Only)       Balance Overall balance assessment: Needs assistance Sitting-balance support: Feet supported Sitting balance-Leahy Scale: Good Sitting balance - Comments: Able to maintain setaed balance in recliner   Standing balance support: Bilateral upper extremity supported, During functional activity, Reliant on assistive device for balance Standing balance-Leahy Scale: Fair Standing balance comment: Appropriate use of RW for support/balance. No LOB. CGA for safety                             Pertinent Vitals/Pain Pain Assessment Pain Assessment: Faces Faces Pain Scale: Hurts a little bit Pain Location: L hip Pain Descriptors / Indicators: Sore  Pain Intervention(s): Limited activity within patient's tolerance, Monitored during session    Home Living Family/patient expects to be discharged to:: Private residence Living Arrangements: Children Available Help at Discharge: Family;Available 24 hours/day Type of Home: Mobile home Home Access: Stairs to enter Entrance  Stairs-Rails: Right;Left;Can reach both Entrance Stairs-Number of Steps: 2   Home Layout: One level Home Equipment: Shower seat - built in;Cane - Programmer, applications (2 wheels) Additional Comments: Reports that RW is very old and it was her father's    Prior Function Prior Level of Function : Independent/Modified Independent;History of Falls (last six months)             Mobility Comments: no AD in the home, SPC outdoors. Pt reports she walks a lot typically. ADLs Comments: IND with ADLs and IADLs, does most of the cooking     Extremity/Trunk Assessment   Upper Extremity Assessment Upper Extremity Assessment: Overall WFL for tasks assessed    Lower Extremity Assessment Lower Extremity Assessment: Generalized weakness LLE Deficits / Details: expected L hip weaknes s/p IM nailing    Cervical / Trunk Assessment Cervical / Trunk Assessment: Normal  Communication   Communication Communication: Impaired Factors Affecting Communication: Hearing impaired    Cognition Arousal: Alert Behavior During Therapy: WFL for tasks assessed/performed   PT - Cognitive impairments: No apparent impairments                       PT - Cognition Comments: Pleasant and agreeable to PT session Following commands: Intact       Cueing Cueing Techniques: Verbal cues     General Comments General comments (skin integrity, edema, etc.): SpO2 monitored throughout session and 91% at the lowest. 1 L of O2 with amb.    Exercises     Assessment/Plan    PT Assessment Patient needs continued PT services  PT Problem List Decreased strength;Decreased range of motion;Decreased activity tolerance;Decreased balance;Decreased mobility;Pain       PT Treatment Interventions DME instruction;Gait training;Stair training;Functional mobility training;Therapeutic activities;Therapeutic exercise;Balance training;Neuromuscular re-education;Patient/family education    PT Goals (Current goals can be  found in the Care Plan section)  Acute Rehab PT Goals Patient Stated Goal: I want to go home PT Goal Formulation: With patient Time For Goal Achievement: 11/09/23 Potential to Achieve Goals: Good    Frequency 7X/week     Co-evaluation               AM-PAC PT 6 Clicks Mobility  Outcome Measure Help needed turning from your back to your side while in a flat bed without using bedrails?: A Little Help needed moving from lying on your back to sitting on the side of a flat bed without using bedrails?: A Little Help needed moving to and from a bed to a chair (including a wheelchair)?: A Little Help needed standing up from a chair using your arms (e.g., wheelchair or bedside chair)?: A Little Help needed to walk in hospital room?: A Little Help needed climbing 3-5 steps with a railing? : A Lot 6 Click Score: 17    End of Session Equipment Utilized During Treatment: Gait belt Activity Tolerance: Patient tolerated treatment well Patient left: in chair;with call bell/phone within reach;with chair alarm set Nurse Communication: Mobility status PT Visit Diagnosis: Difficulty in walking, not elsewhere classified (R26.2)    Time: 8652-8588 PT Time Calculation (min) (ACUTE ONLY): 24 min   Charges:  Quint Chestnut, SPT   Hedi Barkan 10/26/2023, 3:42 PM

## 2023-10-26 NOTE — Progress Notes (Signed)
 Subjective: 1 Day Post-Op Procedure(s) (LRB): FIXATION, FRACTURE, INTERTROCHANTERIC, WITH INTRAMEDULLARY ROD (Left) Patient reports pain as mild.   Patient is well, and has had no acute complaints or problems PT and care management to assist with discharge. Negative for chest pain and shortness of breath Fever: no Gastrointestinal:Negative for nausea and vomiting Patient reports he is passing gas this morning.  Objective: Vital signs in last 24 hours: Temp:  [97.3 F (36.3 C)-98.2 F (36.8 C)] 97.9 F (36.6 C) (09/25 0317) Pulse Rate:  [58-81] 67 (09/25 0317) Resp:  [14-22] 18 (09/25 0317) BP: (84-123)/(60-76) 89/61 (09/25 0317) SpO2:  [94 %-99 %] 99 % (09/25 0317) Weight:  [48.1 kg] 48.1 kg (09/24 1136)  Intake/Output from previous day:  Intake/Output Summary (Last 24 hours) at 10/26/2023 0742 Last data filed at 10/26/2023 0438 Gross per 24 hour  Intake 1385 ml  Output 600 ml  Net 785 ml    Intake/Output this shift: No intake/output data recorded.  Labs: Recent Labs    10/24/23 1430 10/25/23 0543  HGB 14.8 11.8*   Recent Labs    10/24/23 1430 10/25/23 0543  WBC 9.2 7.1  RBC 5.05 4.02  HCT 47.1* 37.2  PLT 224 171   Recent Labs    10/24/23 1430  NA 139  K 4.1  CL 102  CO2 28  BUN 17  CREATININE 0.79  GLUCOSE 130*  CALCIUM  9.1   Recent Labs    10/24/23 1430  INR 1.0     EXAM General - Patient is Alert, Appropriate, and Oriented Extremity - ABD soft Neurovascular intact Dorsiflexion/Plantar flexion intact Incision: dressing C/D/I No cellulitis present Dressing/Incision - clean, dry, no drainage noted to the left hip. Motor Function - intact, moving foot and toes well on exam.  Abdomen soft with intact bowel sounds this AM.  Past Medical History:  Diagnosis Date   AAA (abdominal aortic aneurysm)    Arthritis    knees, elbows   Asthma    Colon cancer (HCC)    colon   COPD (chronic obstructive pulmonary disease) (HCC)    GERD  (gastroesophageal reflux disease)    Hypertension    Sleep apnea     Assessment/Plan: 1 Day Post-Op Procedure(s) (LRB): FIXATION, FRACTURE, INTERTROCHANTERIC, WITH INTRAMEDULLARY ROD (Left) Principal Problem:   Closed displaced intertrochanteric fracture of left femur, initial encounter (HCC) Active Problems:   Aortic aneurysm, thoracic   COPD (chronic obstructive pulmonary disease) (HCC)   Hematoma muscle, left posterior flank muscle,  initial encounter   Personal history of  carcinoid tumor s/p resection 2009   History of bleeding peptic ulcer 2020/hemorrhagic shock   Underweight (BMI < 18.5)   Chronic respiratory failure with hypoxia (HCC)  Estimated body mass index is 18.19 kg/m as calculated from the following:   Height as of this encounter: 5' 4 (1.626 m).   Weight as of this encounter: 48.1 kg. Advance diet Up with therapy D/C IV fluids when tolerating po intake.  Labs and vitals reviewed. Hg 11.8, WBC 7.1. BP 89/61 this AM, continue to monitor.  Denies any vision changes or dizziness this AM.  Consider IV bolus if symptomatic with therapy. Continue to work on a BM. PT and care management to assist with d/c planning, may need SNF at discharge.  DVT Prophylaxis - Lovenox  and TED hose Weight-Bearing as tolerated to left leg  J. Gustavo Level, PA-C [facility] 10/26/2023, 7:42 AM

## 2023-10-27 MED ORDER — ADULT MULTIVITAMIN W/MINERALS CH: 1.0000 | ORAL_TABLET | Freq: Every day | ORAL | 0 refills | Status: DC

## 2023-10-27 MED ORDER — METHOCARBAMOL 500 MG PO TABS: 500.0000 mg | ORAL_TABLET | Freq: Four times a day (QID) | ORAL | 0 refills | Status: AC | PRN

## 2023-10-27 MED ORDER — ASCORBIC ACID 500 MG PO TABS: 500.0000 mg | ORAL_TABLET | Freq: Two times a day (BID) | ORAL | 0 refills | Status: AC

## 2023-10-27 MED ORDER — ZINC SULFATE 220 (50 ZN) MG PO CAPS: 220.0000 mg | ORAL_CAPSULE | Freq: Every day | ORAL | 0 refills | Status: AC

## 2023-10-27 MED ORDER — NICOTINE 14 MG/24HR TD PT24: 14.0000 mg | MEDICATED_PATCH | Freq: Every day | TRANSDERMAL | 0 refills | Status: AC

## 2023-10-27 MED ORDER — ENOXAPARIN SODIUM 40 MG/0.4ML IJ SOSY: 40.0000 mg | PREFILLED_SYRINGE | INTRAMUSCULAR | 0 refills | Status: DC

## 2023-10-27 NOTE — TOC CM/SW Note (Signed)
 Patient is not able to walk the distance required to go the bathroom, or he/she is unable to safely negotiate stairs required to access the bathroom.  A 3in1 BSC will alleviate this problem

## 2023-10-27 NOTE — Progress Notes (Signed)
 Good functional progress this am. Pt able to ambulate around nursing station with Youth Height RW while on RA. SpO2 at 88% briefly with good recovery after 2 minute seated rest break. Reviewed Hip Fx handout with recommended exercises. Pt appears functionally safe to return home with assist from family. Good understanding of proper stair negotiation. Youth RW to be delivered to room.   10/27/23 1030  PT Visit Information  Assistance Needed +1  History of Present Illness Pt is a 73 y.o. female being admitted with acute nondisplaced intertrochanteric fracture as well as an intramuscular hematoma along the left posterior flank. She is post op day 1 from L hip IM nailing. PMH of carcinoid tumor intestine s/p resection 2009, hemorrhagic shock from bleeding esophageal ulcer 2020, thoracic aortic aneurysm, hypertension, COPD on as needed home O2.  Subjective Data  Patient Stated Goal I want to go home  Precautions  Precautions Fall  Recall of Precautions/Restrictions Intact  Restrictions  Weight Bearing Restrictions Per Provider Order Yes  LLE Weight Bearing Per Provider Order WBAT  Pain Assessment  Pain Assessment Faces  Faces Pain Scale 2  Pain Location L hip  Pain Descriptors / Indicators Sore  Pain Intervention(s) Limited activity within patient's tolerance  Cognition  Arousal Alert  Behavior During Therapy WFL for tasks assessed/performed  PT - Cognitive impairments No apparent impairments  PT - Cognition Comments Pleasant and agreeable to PT session  Following Commands  Following commands Intact  Cueing  Cueing Techniques Verbal cues  Communication  Communication Impaired  Factors Affecting Communication Hearing impaired  Bed Mobility  General bed mobility comments NT. PT received in recliner pre/post session  Transfers  Overall transfer level Needs assistance  Equipment used Rolling walker (2 wheels)  Transfers Sit to/from Stand  Sit to Stand Supervision  General transfer  comment pt able to stand from recliner with ease  Ambulation/Gait  Ambulation/Gait assistance Supervision;Contact guard assist  Gait Distance (Feet) 165 Feet  Assistive device Rolling walker (2 wheels) (YOUTH)  Gait Pattern/deviations WFL(Within Functional Limits)  General Gait Details Step through pattern with ambulating with RW on RA, SpO2 88% briefly upon return to room, returning to 92% after 2 minutes  Gait velocity decreased  Balance  Overall balance assessment Needs assistance  Sitting-balance support Feet supported  Sitting balance-Leahy Scale Good  Standing balance support Bilateral upper extremity supported;During functional activity;Reliant on assistive device for balance  Standing balance-Leahy Scale Fair  Standing balance comment good safety, CGA/SBA with RW  Other Exercises  Other Exercises  (Pt educated on role of acute PT, pt given hip fx handout with exercises, discussed safety at home to prevent future falls)  PT - End of Session  Equipment Utilized During Treatment Gait belt  Activity Tolerance Patient tolerated treatment well  Patient left in chair;with call bell/phone within reach;with chair alarm set  Nurse Communication Mobility status   PT - Assessment/Plan  PT Visit Diagnosis Difficulty in walking, not elsewhere classified (R26.2)  PT Frequency (ACUTE ONLY) 7X/week  Follow Up Recommendations Home health PT  Patient can return home with the following A little help with bathing/dressing/bathroom;Help with stairs or ramp for entrance;Assistance with cooking/housework;A little help with walking and/or transfers  PT equipment Rolling walker (2 wheels) (YOUTH Height)  AM-PAC PT 6 Clicks Mobility Outcome Measure (Version 2)  Help needed turning from your back to your side while in a flat bed without using bedrails? 3  Help needed moving from lying on your back to sitting on the side  of a flat bed without using bedrails? 3  Help needed moving to and from a bed to  a chair (including a wheelchair)? 3  Help needed standing up from a chair using your arms (e.g., wheelchair or bedside chair)? 3  Help needed to walk in hospital room? 3  Help needed climbing 3-5 steps with a railing?  2  6 Click Score 17  Consider Recommendation of Discharge To: Home with [facility]  Progressive Mobility  What is the highest level of mobility based on the mobility assessment? Level 4 (Ambulates with assistance) - Balance while stepping forward/back - Complete  Activity Ambulated with assistance  PT Goal Progression  Progress towards PT goals Progressing toward goals  PT Time Calculation  PT Start Time (ACUTE ONLY) 1000  PT Stop Time (ACUTE ONLY) 1030  PT Time Calculation (min) (ACUTE ONLY) 30 min  PT General Charges  $$ ACUTE PT VISIT 1 Visit  PT Treatments  $Gait Training 8-22 mins  $Therapeutic Exercise 8-22 mins  Darice Bohr, PTA

## 2023-10-27 NOTE — Progress Notes (Signed)
 Occupational Therapy Treatment Patient Details Name: Alicia Holder MRN: [id number] DOB: 1950-06-21 Today's Date: 10/27/2023   History of present illness Pt is a 73 y.o. female being admitted with acute nondisplaced intertrochanteric fracture as well as an intramuscular hematoma along the left posterior flank. She is post op day 1 from L hip IM nailing. PMH of carcinoid tumor intestine s/p resection 2009, hemorrhagic shock from bleeding esophageal ulcer 2020, thoracic aortic aneurysm, hypertension, COPD on as needed home O2.   OT comments  Pt is seated in recliner on arrival. Pleasant and agreeable to OT session. She reports very mild soreness to L hip. Pt performed STS and ADL transfer with supervision/SBA this date with ambulation bouts of 10 ft x2. She demo LB dressing with CGA/SBA and standing grooming tasks with SBA. Pt was on RA throughout session and dropped to 86% on RA requiring 1L 02 via [place] to return to 92%.  Pt returned to recliner with all needs in place and will cont to require skilled acute OT services to maximize her safety and IND to return to PLOF.       If plan is discharge home, recommend the following:  A little help with walking and/or transfers;A little help with bathing/dressing/bathroom;Help with stairs or ramp for entrance;Assistance with cooking/housework   Equipment Recommendations  BSC/3in1    Recommendations for Other Services      Precautions / Restrictions Precautions Precautions: Fall Recall of Precautions/Restrictions: Intact Restrictions Weight Bearing Restrictions Per Provider Order: Yes LLE Weight Bearing Per Provider Order: Weight bearing as tolerated       Mobility Bed Mobility               General bed mobility comments: NT. PT received in recliner pre/post session    Transfers Overall transfer level: Needs assistance Equipment used: Rolling walker (2 wheels) Transfers: Sit to/from Stand Sit to Stand: Supervision            General transfer comment: pt able to stand from recliner with ease, utilizes RW with good safety to ambulate to the bathroom and back     Balance Overall balance assessment: Needs assistance Sitting-balance support: Feet supported Sitting balance-Leahy Scale: Good     Standing balance support: Bilateral upper extremity supported, During functional activity, Reliant on assistive device for balance Standing balance-Leahy Scale: Fair Standing balance comment: good safety, CGA/SBA with RW                           ADL either performed or assessed with clinical judgement   ADL Overall ADL's : Needs assistance/impaired     Grooming: Wash/dry face;Wash/dry hands;Standing;Contact guard assist;Supervision/safety               Lower Body Dressing: Contact guard assist;Supervision/safety;Sitting/lateral leans;Sit to/from stand Lower Body Dressing Details (indicate cue type and reason): to donn mesh underwear Toilet Transfer: Contact guard assist;Rolling walker (2 wheels);Ambulation;Comfort height toilet Toilet Transfer Details (indicate cue type and reason): cont urination on toilet with BSC over toilet Toileting- Clothing Manipulation and Hygiene: Sit to/from stand;Contact guard assist;Supervision/safety Toileting - Clothing Manipulation Details (indicate cue type and reason): able to perform standing anterior hygiene/peri-care with CGA/SBA     Functional mobility during ADLs: Contact guard assist;Rolling walker (2 wheels)      Extremity/Trunk Assessment              Vision       Perception     Praxis  Communication Communication Communication: Impaired Factors Affecting Communication: Hearing impaired   Cognition Arousal: Alert Behavior During Therapy: WFL for tasks assessed/performed                                 Following commands: Intact        Cueing   Cueing Techniques: Verbal cues  Exercises Other Exercises Other  Exercises: Education on DME/AE/AD needs and task simplification to maximize safety and prevent falls upon return home.    Shoulder Instructions       General Comments on RA during session, dropped to 86% with activity and required 1L to improve to 92%    Pertinent Vitals/ Pain       Pain Assessment Pain Assessment: 0-10 Pain Score: 1  Pain Location: L hip Pain Descriptors / Indicators: Sore Pain Intervention(s): Monitored during session, Repositioned  Home Living                                          Prior Functioning/Environment              Frequency  Min 3X/week        Progress Toward Goals  OT Goals(current goals can now be found in the care plan section)  Progress towards OT goals: Progressing toward goals  Acute Rehab OT Goals Patient Stated Goal: go home OT Goal Formulation: With patient Time For Goal Achievement: 11/09/23 Potential to Achieve Goals: Good  Plan      Co-evaluation                 AM-PAC OT 6 Clicks Daily Activity     Outcome Measure   Help from another person eating meals?: None Help from another person taking care of personal grooming?: None Help from another person toileting, which includes using toliet, bedpan, or urinal?: A Little Help from another person bathing (including washing, rinsing, drying)?: A Little Help from another person to put on and taking off regular upper body clothing?: None Help from another person to put on and taking off regular lower body clothing?: A Little 6 Click Score: 21    End of Session Equipment Utilized During Treatment: Rolling walker (2 wheels)  OT Visit Diagnosis: Other abnormalities of gait and mobility (R26.89);Muscle weakness (generalized) (M62.81)   Activity Tolerance Patient tolerated treatment well   Patient Left in chair;with call bell/phone within reach;with chair alarm set   Nurse Communication Mobility status        Time: 9196-9179 OT Time  Calculation (min): 17 min  Charges: OT General Charges $OT Visit: 1 Visit OT Treatments $Self Care/Home Management : 8-22 mins  Monia Timmers, OTR/L  10/27/23, 9:06 AM   Judge Duque E Estiven Kohan 10/27/2023, 9:05 AM

## 2023-10-27 NOTE — Discharge Summary (Signed)
 Physician Discharge Summary   Patient: Alicia Holder MRN: [id number] DOB: [birth year]  Admit date:     10/24/2023  Discharge date: 10/27/23  Discharge Physician: Deliliah Room   PCP: [facility]   Recommendations at discharge:    F/u with PCP in one week. F/u outpatient with orthopedics in 2 weeks. Continue taking meds as prescribed.   Discharge Diagnoses: Principal Problem:   Closed displaced intertrochanteric fracture of left femur, initial encounter [place]) Active Problems:   Hematoma muscle, left posterior flank muscle,  initial encounter   COPD (chronic obstructive pulmonary disease) (HCC)   Underweight (BMI < 18.5)   Aortic aneurysm, thoracic   Personal history of  carcinoid tumor s/p resection 2009   History of bleeding peptic ulcer 2020/hemorrhagic shock   Chronic respiratory failure with hypoxia (HCC)   Protein-calorie malnutrition, severe   Hospital Course:  73 y.o. female with medical history significant for carcinoid tumor intestine s/p resection 2009, hemorrhagic shock from bleeding esophageal ulcer 2020, thoracic aortic aneurysm, hypertension, COPD on as needed home O2, being admitted with hip fracture resulting from an accidental fall a week prior on her driveway.  She was found to have left greater trochanter fracture with intertrochanteric extension, underwent intramedullary nailing of the left femur with cephalomedullary device, done by orthopedics on 10/25/2023.  She did work with OT and PT in the hospital. She refused to go to rehab/SNF on discharge. HHS were arranged for PT/OT. She lives at home with her daughter, Camelia. She has another daughter who frequently comes and checks up on her. She also has granddaughters. She will follow up with orthopedics in 2 weeks.      Consultants: Orthopedics Procedures performed: intramedullary nailing of the left femur with cephalomedullary device   Disposition: Home health Diet recommendation:   Regular diet DISCHARGE MEDICATION: Allergies as of 10/27/2023       Reactions   Aspirin    Hx of esophageal bleed        Medication List     STOP taking these medications    lisinopril  20 MG tablet Commonly known as: ZESTRIL    simvastatin  20 MG tablet Commonly known as: ZOCOR        TAKE these medications    acetaminophen  500 MG tablet Commonly known as: TYLENOL  Take 1,000 mg by mouth every 6 (six) hours as needed for mild pain.   albuterol  108 (90 Base) MCG/ACT inhaler Commonly known as: VENTOLIN  HFA Inhale 1-2 puffs into the lungs every 4 (four) hours as needed for wheezing or shortness of breath.   alendronate  70 MG tablet Commonly known as: FOSAMAX  Take 70 mg by mouth once a week.   ascorbic acid  500 MG tablet Commonly known as: VITAMIN C  Take 1 tablet (500 mg total) by mouth 2 (two) times daily.   buPROPion  150 MG 24 hr tablet Commonly known as: WELLBUTRIN  XL Take 150 mg by mouth daily.   docusate sodium  100 MG capsule Commonly known as: COLACE Take 100 mg by mouth daily.   enoxaparin  40 MG/0.4ML injection Commonly known as: LOVENOX  Inject 0.4 mLs (40 mg total) into the skin daily.   famotidine  20 MG tablet Commonly known as: PEPCID  Take 20 mg by mouth 2 (two) times daily.   ipratropium-albuterol  0.5-2.5 (3) MG/3ML Soln Commonly known as: DUONEB Take 3 mLs by nebulization every 4 (four) hours as needed.   methocarbamol  500 MG tablet Commonly known as: ROBAXIN  Take 1 tablet (500 mg total) by mouth every 6 (six) hours as  needed for muscle spasms.   montelukast  10 MG tablet Commonly known as: SINGULAIR  Take 10 mg by mouth at bedtime.   nicotine  14 mg/24hr patch Commonly known as: NICODERM CQ  - dosed in mg/24 hours Place 1 patch (14 mg total) onto the skin daily. Start taking on: October 28, 2023   omeprazole 20 MG capsule Commonly known as: PRILOSEC Take 20 mg by mouth 2 (two) times daily.   ondansetron  4 MG tablet Commonly known  as: Zofran  Take 1 tablet (4 mg total) by mouth every 8 (eight) hours as needed for nausea or vomiting.   oxyCODONE  5 MG immediate release tablet Commonly known as: Oxy IR/ROXICODONE  Take 5 mg by mouth 3 (three) times daily as needed for moderate pain or breakthrough pain.   pantoprazole  40 MG tablet Commonly known as: PROTONIX  TAKE 1 TABLET BY MOUTH EVERY DAY What changed: Another medication with the same name was removed. Continue taking this medication, and follow the directions you see here.   rosuvastatin  20 MG tablet Commonly known as: CRESTOR  Take 20 mg by mouth daily.   sertraline  100 MG tablet Commonly known as: ZOLOFT  Take 100 mg by mouth daily.   Trelegy Ellipta 100-62.5-25 MCG/INH Aepb Generic drug: Fluticasone-Umeclidin-Vilant Inhale 1 puff into the lungs daily.   Vitamin D3 1.25 MG (50000 UT) Caps VITAMIN D3 1.25 MG (50000 UT) CAPS   zinc  sulfate (50mg  elemental zinc ) 220 (50 Zn) MG capsule Take 1 capsule (220 mg total) by mouth daily. Start taking on: October 28, 2023               Durable Medical Equipment  (From admission, onward)           Start     Ordered   10/27/23 1039  For home use only DME Walker rolling  Once       Question Answer Comment  Walker: With 5 Inch Wheels   Patient needs a walker to treat with the following condition Status post hip surgery      10/27/23 1038   10/27/23 0915  For home use only DME Bedside commode  Once       Question:  Patient needs a bedside commode to treat with the following condition  Answer:  Status post hip surgery   10/27/23 0914            Follow-up Information     Kip Lynwood Double, PA-C Follow up in 14 day(s).   Specialty: Physician Assistant Why: Staple Removal and x-rays of left femur. Contact information: [address] [place] [address] [phone]         [facility] Schedule an appointment as soon as possible for a visit in 1 week(s).    Contact information: [address] [place] [address] [phone]                Discharge Exam: Fredricka Weights   10/25/23 1136  Weight: 48.1 kg   General exam: Appears calm and comfortable  Respiratory system: Clear to auscultation. Respiratory effort normal. Cardiovascular system: S1 & S2 heard, RRR. No JVD, murmurs, rubs, gallops or clicks. No pedal edema. Gastrointestinal system: Abdomen is nondistended, soft and nontender. No organomegaly or masses felt. Normal bowel sounds heard. Central nervous system: Alert and oriented. No focal neurological deficits. Extremities: Left hip dressing in place, no evidence of discharge Skin: No rashes, lesions or ulcers Psychiatry: Judgement and insight appear normal. Mood & affect appropriate.  Condition at discharge: good  The results of significant diagnostics from this hospitalization (including imaging, microbiology, ancillary and laboratory) are listed below for reference.   Imaging Studies: DG HIP UNILAT WITH PELVIS 2-3 VIEWS LEFT Result Date: 10/25/2023 CLINICAL DATA:  Elective surgery. EXAM: DG HIP (WITH OR WITHOUT PELVIS) 2-3V LEFT COMPARISON:  Preoperative imaging FINDINGS: Six fluoroscopic spot views of the femur submitted from the operating room. Intramedullary nail with trans trochanteric and distal locking screw traverse proximal femur fracture. Fluoroscopy time 2 minutes 4 seconds. Dose 20.91 mGy. IMPRESSION: Intraoperative fluoroscopy during proximal femur fracture ORIF. Electronically Signed   By: Andrea Gasman M.D.   On: 10/25/2023 15:00   DG C-Arm 1-60 Min-No Report Result Date: 10/25/2023 Fluoroscopy was utilized by the requesting physician.  No radiographic interpretation.   DG C-Arm 1-60 Min-No Report Result Date: 10/25/2023 Fluoroscopy was utilized by the requesting physician.  No radiographic interpretation.   MR FEMUR LEFT WO CONTRAST Result Date: 10/24/2023 EXAM: MR LEFT LOWER EXTREMITY GENERIC BONE  WITHOUT INTRAVENOUS CONTRAST 10/24/2023 07:52:26 PM TECHNIQUE: Multiplanar magnetic resonance images of the left lower extremity generic bone without intravenous contrast. COMPARISON: Left hip radiograph earlier today. CLINICAL HISTORY: Concern for nondisplaced questionable left femur fracture. Eval for occult femur fracture. 73 y.o. female past medical history significant for COPD on intermittent 2 L home oxygen, who presents to the emergency department with hip pain. States that she fell when ambulating in her driveway approximately 1 week ago. Ongoing pain to her left hip despite trying to do exercises for it. States that she was seen at urgent care today and told that she had a questionable fracture and to come to the emergency department. Denies being on anticoagulation. No history of hip injury. FINDINGS: SOFT TISSUES: Intramuscular hematoma with soft tissue swelling along the left posterior flank (series 11/image 11). JOINTS: Unremarkable. No dislocation or significant effusion. BONES: Nondisplaced intertrochanteric left hip fracture (series 7/images 8-12) with associated marrow edema, acute. IMPRESSION: 1. Acute nondisplaced intertrochanteric left hip fracture. 2. Intramuscular hematoma along the left posterior flank. Electronically signed by: Pinkie Pebbles MD 10/24/2023 10:55 PM EDT RP Workstation: [id number]   DG Chest Portable 1 View Result Date: 10/24/2023 CLINICAL DATA:  Hypoxia, fall. EXAM: PORTABLE CHEST 1 VIEW COMPARISON:  Chest x-ray 11/07/2021.  CT of the chest 05/27/2021. FINDINGS: The cardiomediastinal silhouette there stable given differences in technique. There is increased soft tissue density in the left paratracheal region measuring up to 3 cm in thickness. This correlates to left thyroid  exophytic nodule seen on prior chest CT. The aorta is ectatic. Moderate-sized hiatal hernia is again seen. Heart size is within normal limits. There is no focal lung infiltrate, pleural effusion or  pneumothorax. There is a healed right-sided rib fracture. No acute fractures are seen. IMPRESSION: 1. No acute cardiopulmonary process. 2. Increased soft tissue density in the left paratracheal region correlates to left thyroid  exophytic nodule seen on prior chest CT. 3. Moderate-sized hiatal hernia. Electronically Signed   By: Greig Pique M.D.   On: 10/24/2023 15:34   DG Hip Unilat With Pelvis 2-3 Views Left Result Date: 10/24/2023 CLINICAL DATA:  Fall at home. EXAM: DG HIP (WITH OR WITHOUT PELVIS) 2-3V*L* COMPARISON:  None Available. FINDINGS: Hips are located. No evidence of pelvic fracture or sacral fracture. Dedicated view of the LEFT hip demonstrates a lucency along the lateral margin of the greater trochanter present on two views. Femoral neck is intact. IMPRESSION: Concern for nondisplaced  LEFT greater trochanter femur fracture. Electronically Signed   By: Jackquline  Malva M.D.   On: 10/24/2023 12:31    Microbiology: Results for orders placed or performed during the hospital encounter of 11/07/21  Resp Panel by RT-PCR (Flu A&B, Covid) Anterior Nasal Swab     Status: None   Collection Time: 11/07/21  3:45 PM   Specimen: Anterior Nasal Swab  Result Value Ref Range Status   SARS Coronavirus 2 by RT PCR NEGATIVE NEGATIVE Final    Comment: (NOTE) SARS-CoV-2 target nucleic acids are NOT DETECTED.  The SARS-CoV-2 RNA is generally detectable in upper respiratory specimens during the acute phase of infection. The lowest concentration of SARS-CoV-2 viral copies this assay can detect is 138 copies/mL. A negative result does not preclude SARS-Cov-2 infection and should not be used as the sole basis for treatment or other patient management decisions. A negative result may occur with  improper specimen collection/handling, submission of specimen other than nasopharyngeal swab, presence of viral mutation(s) within the areas targeted by this assay, and inadequate number of viral copies(<138  copies/mL). A negative result must be combined with clinical observations, patient history, and epidemiological information. The expected result is Negative.  Fact Sheet for Patients:  BloggerCourse.com  Fact Sheet for Healthcare Providers:  SeriousBroker.it  This test is no t yet approved or cleared by the [place]  FDA and  has been authorized for detection and/or diagnosis of SARS-CoV-2 by FDA under an Emergency Use Authorization (EUA). This EUA will remain  in effect (meaning this test can be used) for the duration of the COVID-19 declaration under Section 564(b)(1) of the Act, 21 U.S.C.section 360bbb-3(b)(1), unless the authorization is terminated  or revoked sooner.       Influenza A by PCR NEGATIVE NEGATIVE Final   Influenza B by PCR NEGATIVE NEGATIVE Final    Comment: (NOTE) The Xpert Xpress SARS-CoV-2/FLU/RSV plus assay is intended as an aid in the diagnosis of influenza from Nasopharyngeal swab specimens and should not be used as a sole basis for treatment. Nasal washings and aspirates are unacceptable for Xpert Xpress SARS-CoV-2/FLU/RSV testing.  Fact Sheet for Patients: BloggerCourse.com  Fact Sheet for Healthcare Providers: SeriousBroker.it  This test is not yet approved or cleared by the [place]  FDA and has been authorized for detection and/or diagnosis of SARS-CoV-2 by FDA under an Emergency Use Authorization (EUA). This EUA will remain in effect (meaning this test can be used) for the duration of the COVID-19 declaration under Section 564(b)(1) of the Act, 21 U.S.C. section 360bbb-3(b)(1), unless the authorization is terminated or revoked.  Performed at [facility], [address] [place], [place] [address]     Labs: CBC: Recent Labs  Lab 10/24/23 1430 10/25/23 0543  WBC 9.2 7.1  NEUTROABS 6.9  --   HGB 14.8 11.8*  HCT 47.1*  37.2  MCV 93.3 92.5  PLT 224 171   Basic Metabolic Panel: Recent Labs  Lab 10/24/23 1430  NA 139  K 4.1  CL 102  CO2 28  GLUCOSE 130*  BUN 17  CREATININE 0.79  CALCIUM  9.1   Liver Function Tests: No results for input(s): AST, ALT, ALKPHOS, BILITOT, PROT, ALBUMIN in the last 168 hours. CBG: No results for input(s): GLUCAP in the last 168 hours.  Discharge time spent: 42 minutes.  Signed: Deliliah Room, MD [facility] 10/27/2023

## 2023-10-27 NOTE — Discharge Instructions (Signed)

## 2023-10-27 NOTE — Progress Notes (Signed)
 Subjective: 2 Days Post-Op Procedure(s) (LRB): FIXATION, FRACTURE, INTERTROCHANTERIC, WITH INTRAMEDULLARY ROD (Left) Patient reports pain as mild.   Patient is well, and has had no acute complaints or problems PT and care management to assist with discharge. Was able to walk over 160 feet yesterday. Negative for chest pain and shortness of breath Fever: no Gastrointestinal:Negative for nausea and vomiting Patient had a BM yesterday.  Objective: Vital signs in last 24 hours: Temp:  [97.1 F (36.2 C)-98 F (36.7 C)] 97.6 F (36.4 C) (09/26 0339) Pulse Rate:  [62-84] 68 (09/26 0339) Resp:  [15-18] 15 (09/26 0339) BP: (82-117)/(55-69) 117/69 (09/26 0339) SpO2:  [93 %-99 %] 95 % (09/26 0339)  Intake/Output from previous day:  Intake/Output Summary (Last 24 hours) at 10/27/2023 0744 Last data filed at 10/26/2023 2355 Gross per 24 hour  Intake 960 ml  Output 900 ml  Net 60 ml    Intake/Output this shift: No intake/output data recorded.  Labs: Recent Labs    10/24/23 1430 10/25/23 0543  HGB 14.8 11.8*   Recent Labs    10/24/23 1430 10/25/23 0543  WBC 9.2 7.1  RBC 5.05 4.02  HCT 47.1* 37.2  PLT 224 171   Recent Labs    10/24/23 1430  NA 139  K 4.1  CL 102  CO2 28  BUN 17  CREATININE 0.79  GLUCOSE 130*  CALCIUM  9.1   Recent Labs    10/24/23 1430  INR 1.0     EXAM General - Patient is Alert, Appropriate, and Oriented Extremity - ABD soft Neurovascular intact Dorsiflexion/Plantar flexion intact No cellulitis present Dressing/Incision - Minimal spotting to the proximal incision site. Motor Function - intact, moving foot and toes well on exam.  Abdomen soft with intact bowel sounds this AM.  Past Medical History:  Diagnosis Date   AAA (abdominal aortic aneurysm)    Arthritis    knees, elbows   Asthma    Colon cancer (HCC)    colon   COPD (chronic obstructive pulmonary disease) (HCC)    GERD (gastroesophageal reflux disease)    Hypertension     Sleep apnea     Assessment/Plan: 2 Days Post-Op Procedure(s) (LRB): FIXATION, FRACTURE, INTERTROCHANTERIC, WITH INTRAMEDULLARY ROD (Left) Principal Problem:   Closed displaced intertrochanteric fracture of left femur, initial encounter (HCC) Active Problems:   Aortic aneurysm, thoracic   COPD (chronic obstructive pulmonary disease) (HCC)   Hematoma muscle, left posterior flank muscle,  initial encounter   Personal history of  carcinoid tumor s/p resection 2009   History of bleeding peptic ulcer 2020/hemorrhagic shock   Underweight (BMI < 18.5)   Chronic respiratory failure with hypoxia (HCC)   Protein-calorie malnutrition, severe  Estimated body mass index is 18.19 kg/m as calculated from the following:   Height as of this encounter: 5' 4 (1.626 m).   Weight as of this encounter: 48.1 kg. Advance diet Up with therapy D/C IV fluids when tolerating po intake.  Vitals reviewed this AM. BP 117/69 this AM, continue to monitor.  Denies any vision changes or dizziness this AM.  500 IV bolus given yesterday. Patient has had a BM. She was able to walk 160 feet yesterday with therapy. PT and care management to assist with d/c planning, may need SNF at discharge.  Following discharge, f/u with [facility] orthopaedics in 10-14 days for staple removal and x-rays of the left hip. Continue Lovenox  40mg  daily for 4 weeks.  DVT Prophylaxis - Lovenox  and TED hose Weight-Bearing as tolerated to left  leg  J. Gustavo Level, PA-C [facility] 10/27/2023, 7:44 AM

## 2023-10-27 NOTE — Plan of Care (Signed)
 Pt discharging to home with daughter via wheelchair. PIV removed. Discharge paperwork reviewed and all questions and concerns answered.    Problem: Education: Goal: Knowledge of General Education information will improve Description: Including pain rating scale, medication(s)/side effects and non-pharmacologic comfort measures Outcome: Adequate for Discharge   Problem: Health Behavior/Discharge Planning: Goal: Ability to manage health-related needs will improve Outcome: Adequate for Discharge   Problem: Clinical Measurements: Goal: Ability to maintain clinical measurements within normal limits will improve Outcome: Adequate for Discharge Goal: Will remain free from infection Outcome: Adequate for Discharge Goal: Diagnostic test results will improve Outcome: Adequate for Discharge Goal: Respiratory complications will improve Outcome: Adequate for Discharge Goal: Cardiovascular complication will be avoided Outcome: Adequate for Discharge   Problem: Activity: Goal: Risk for activity intolerance will decrease Outcome: Adequate for Discharge   Problem: Nutrition: Goal: Adequate nutrition will be maintained Outcome: Adequate for Discharge   Problem: Coping: Goal: Level of anxiety will decrease Outcome: Adequate for Discharge   Problem: Elimination: Goal: Will not experience complications related to bowel motility Outcome: Adequate for Discharge Goal: Will not experience complications related to urinary retention Outcome: Adequate for Discharge   Problem: Pain Managment: Goal: General experience of comfort will improve and/or be controlled Outcome: Adequate for Discharge   Problem: Safety: Goal: Ability to remain free from injury will improve Outcome: Adequate for Discharge   Problem: Skin Integrity: Goal: Risk for impaired skin integrity will decrease Outcome: Adequate for Discharge

## 2023-10-30 NOTE — Anesthesia Postprocedure Evaluation (Signed)
 Anesthesia Post Note  Patient: Levetta S Neidlinger  Procedure(s) Performed: FIXATION, FRACTURE, INTERTROCHANTERIC, WITH INTRAMEDULLARY ROD (Left: Hip)  Patient location during evaluation: PACU Anesthesia Type: General Level of consciousness: awake and alert Pain management: pain level controlled Vital Signs Assessment: post-procedure vital signs reviewed and stable Respiratory status: spontaneous breathing, nonlabored ventilation, respiratory function stable and patient connected to nasal cannula oxygen Cardiovascular status: blood pressure returned to baseline and stable Postop Assessment: no apparent nausea or vomiting Anesthetic complications: no   No notable events documented.   Last Vitals:  Vitals:   10/27/23 0339 10/27/23 0908  BP: 117/69 111/72  Pulse: 68 78  Resp: 15 18  Temp: 36.4 C (!) 36.3 C  SpO2: 95% 97%    Last Pain:  Vitals:   10/27/23 0930  TempSrc:   PainSc: 3                  Lynwood KANDICE Clause

## 2023-11-09 ENCOUNTER — Other Ambulatory Visit: Payer: Self-pay | Admitting: Family Medicine

## 2023-11-09 DIAGNOSIS — Z780 Asymptomatic menopausal state: Secondary | ICD-10-CM

## 2024-01-02 ENCOUNTER — Ambulatory Visit

## 2024-02-15 ENCOUNTER — Emergency Department

## 2024-02-15 ENCOUNTER — Other Ambulatory Visit: Payer: Self-pay | Admitting: Physician Assistant

## 2024-02-15 ENCOUNTER — Ambulatory Visit: Admission: RE | Admit: 2024-02-15 | Source: Ambulatory Visit

## 2024-02-15 ENCOUNTER — Inpatient Hospital Stay
Admission: EM | Admit: 2024-02-15 | Discharge: 2024-02-20 | DRG: 417 | Disposition: A | Discharge status: Home / Self Care | Attending: Surgery | Admitting: Surgery

## 2024-02-15 ENCOUNTER — Other Ambulatory Visit: Payer: Self-pay

## 2024-02-15 DIAGNOSIS — Z79899 Other long term (current) drug therapy: Secondary | ICD-10-CM

## 2024-02-15 DIAGNOSIS — Z8249 Family history of ischemic heart disease and other diseases of the circulatory system: Secondary | ICD-10-CM

## 2024-02-15 DIAGNOSIS — E785 Hyperlipidemia, unspecified: Secondary | ICD-10-CM | POA: Diagnosis present

## 2024-02-15 DIAGNOSIS — M159 Polyosteoarthritis, unspecified: Secondary | ICD-10-CM | POA: Diagnosis present

## 2024-02-15 DIAGNOSIS — K810 Acute cholecystitis: Secondary | ICD-10-CM | POA: Diagnosis present

## 2024-02-15 DIAGNOSIS — I10 Essential (primary) hypertension: Secondary | ICD-10-CM | POA: Diagnosis present

## 2024-02-15 DIAGNOSIS — Z90710 Acquired absence of both cervix and uterus: Secondary | ICD-10-CM

## 2024-02-15 DIAGNOSIS — K219 Gastro-esophageal reflux disease without esophagitis: Secondary | ICD-10-CM | POA: Diagnosis present

## 2024-02-15 DIAGNOSIS — Z886 Allergy status to analgesic agent status: Secondary | ICD-10-CM

## 2024-02-15 DIAGNOSIS — K8000 Calculus of gallbladder with acute cholecystitis without obstruction: Principal | ICD-10-CM | POA: Diagnosis present

## 2024-02-15 DIAGNOSIS — Z681 Body mass index (BMI) 19 or less, adult: Secondary | ICD-10-CM

## 2024-02-15 DIAGNOSIS — K839 Disease of biliary tract, unspecified: Secondary | ICD-10-CM

## 2024-02-15 DIAGNOSIS — I714 Abdominal aortic aneurysm, without rupture, unspecified: Secondary | ICD-10-CM | POA: Diagnosis present

## 2024-02-15 DIAGNOSIS — J4489 Other specified chronic obstructive pulmonary disease: Secondary | ICD-10-CM | POA: Diagnosis present

## 2024-02-15 DIAGNOSIS — Z8679 Personal history of other diseases of the circulatory system: Secondary | ICD-10-CM

## 2024-02-15 DIAGNOSIS — E43 Unspecified severe protein-calorie malnutrition: Secondary | ICD-10-CM | POA: Diagnosis present

## 2024-02-15 DIAGNOSIS — M4856XA Collapsed vertebra, not elsewhere classified, lumbar region, initial encounter for fracture: Secondary | ICD-10-CM | POA: Diagnosis present

## 2024-02-15 DIAGNOSIS — Z7983 Long term (current) use of bisphosphonates: Secondary | ICD-10-CM

## 2024-02-15 DIAGNOSIS — R1031 Right lower quadrant pain: Secondary | ICD-10-CM

## 2024-02-15 DIAGNOSIS — F17200 Nicotine dependence, unspecified, uncomplicated: Secondary | ICD-10-CM | POA: Diagnosis present

## 2024-02-15 DIAGNOSIS — K660 Peritoneal adhesions (postprocedural) (postinfection): Secondary | ICD-10-CM | POA: Diagnosis present

## 2024-02-15 DIAGNOSIS — R911 Solitary pulmonary nodule: Secondary | ICD-10-CM | POA: Diagnosis present

## 2024-02-15 DIAGNOSIS — K819 Cholecystitis, unspecified: Principal | ICD-10-CM

## 2024-02-15 DIAGNOSIS — G4730 Sleep apnea, unspecified: Secondary | ICD-10-CM | POA: Diagnosis present

## 2024-02-15 DIAGNOSIS — Z85038 Personal history of other malignant neoplasm of large intestine: Secondary | ICD-10-CM

## 2024-02-15 LAB — COMPREHENSIVE METABOLIC PANEL WITH GFR
ALT: 27 U/L (ref 0–44)
AST: 27 U/L (ref 15–41)
Albumin: 3.6 g/dL (ref 3.5–5.0)
Alkaline Phosphatase: 67 U/L (ref 38–126)
Anion gap: 9 (ref 5–15)
BUN: 14 mg/dL (ref 8–23)
CO2: 26 mmol/L (ref 22–32)
Calcium: 9.9 mg/dL (ref 8.9–10.3)
Chloride: 100 mmol/L (ref 98–111)
Creatinine, Ser: 0.53 mg/dL (ref 0.44–1.00)
GFR, Estimated: >60 mL/min
Glucose, Bld: 87 mg/dL (ref 70–99)
Potassium: 4 mmol/L (ref 3.5–5.1)
Sodium: 135 mmol/L (ref 135–145)
Total Bilirubin: 0.3 mg/dL (ref 0.0–1.2)
Total Protein: 7.1 g/dL (ref 6.5–8.1)

## 2024-02-15 LAB — CBC
HCT: 40.5 % (ref 36.0–46.0)
Hemoglobin: 12.8 g/dL (ref 12.0–15.0)
MCH: 28.4 pg (ref 26.0–34.0)
MCHC: 31.6 g/dL (ref 30.0–36.0)
MCV: 89.8 fL (ref 80.0–100.0)
Platelets: 202 K/uL (ref 150–400)
RBC: 4.51 MIL/uL (ref 3.87–5.11)
RDW: 15.4 % (ref 11.5–15.5)
WBC: 9 K/uL (ref 4.0–10.5)
nRBC: 0 % (ref 0.0–0.2)

## 2024-02-15 LAB — LIPASE, BLOOD: Lipase: 16 U/L (ref 11–51)

## 2024-02-15 LAB — TROPONIN T, HIGH SENSITIVITY: Troponin T High Sensitivity: <15 ng/L (ref 0–19)

## 2024-02-15 MED ORDER — MORPHINE SULFATE (PF) 2 MG/ML IV SOLN
2.0000 mg | INTRAVENOUS | Status: DC | PRN
  Administered 2024-02-17 – 2024-02-19 (×5): 2 mg via INTRAVENOUS
  Filled 2024-02-15 (×5): qty 1

## 2024-02-15 MED ORDER — IOHEXOL 300 MG/ML  SOLN
100.0000 mL | Freq: Once | INTRAMUSCULAR | Status: AC | PRN
  Administered 2024-02-15: 100 mL via INTRAVENOUS

## 2024-02-15 MED ORDER — DOCUSATE SODIUM 100 MG PO CAPS: 100.0000 mg | ORAL_CAPSULE | Freq: Two times a day (BID) | ORAL | Status: DC | PRN

## 2024-02-15 MED ORDER — SODIUM CHLORIDE 0.9 % IV SOLN
2.0000 g | INTRAVENOUS | Status: DC
  Administered 2024-02-16 – 2024-02-17 (×3): 2 g via INTRAVENOUS
  Filled 2024-02-15 (×5): qty 20

## 2024-02-15 MED ORDER — LIDOCAINE VISCOUS HCL 2 % MT SOLN
15.0000 mL | Freq: Once | OROMUCOSAL | Status: AC
  Administered 2024-02-15: 15 mL via ORAL
  Filled 2024-02-15: qty 15

## 2024-02-15 MED ORDER — BUDESON-GLYCOPYRROL-FORMOTEROL 160-9-4.8 MCG/ACT IN AERO
2.0000 | INHALATION_SPRAY | Freq: Two times a day (BID) | RESPIRATORY_TRACT | Status: AC
  Administered 2024-02-16 – 2024-02-20 (×8): 2 via RESPIRATORY_TRACT
  Filled 2024-02-15 (×2): qty 5.9

## 2024-02-15 MED ORDER — ONDANSETRON HCL 4 MG/2ML IJ SOLN
4.0000 mg | Freq: Once | INTRAMUSCULAR | Status: AC
  Administered 2024-02-15: 4 mg via INTRAVENOUS
  Filled 2024-02-15: qty 2

## 2024-02-15 MED ORDER — PANTOPRAZOLE SODIUM 40 MG PO TBEC
40.0000 mg | DELAYED_RELEASE_TABLET | Freq: Every day | ORAL | Status: DC
  Administered 2024-02-15 – 2024-02-20 (×6): 40 mg via ORAL
  Filled 2024-02-15 (×7): qty 1

## 2024-02-15 MED ORDER — BUPROPION HCL ER (XL) 150 MG PO TB24
150.0000 mg | ORAL_TABLET | Freq: Every day | ORAL | Status: AC
  Administered 2024-02-16 – 2024-02-20 (×4): 150 mg via ORAL
  Filled 2024-02-15 (×6): qty 1

## 2024-02-15 MED ORDER — ALBUTEROL SULFATE (2.5 MG/3ML) 0.083% IN NEBU: 3.0000 mL | INHALATION_SOLUTION | RESPIRATORY_TRACT | Status: DC | PRN

## 2024-02-15 MED ORDER — METHOCARBAMOL 500 MG PO TABS
500.0000 mg | ORAL_TABLET | Freq: Four times a day (QID) | ORAL | Status: AC | PRN
  Administered 2024-02-16 – 2024-02-18 (×3): 500 mg via ORAL
  Filled 2024-02-15 (×3): qty 1

## 2024-02-15 MED ORDER — ALUM & MAG HYDROXIDE-SIMETH 200-200-20 MG/5ML PO SUSP
30.0000 mL | Freq: Once | ORAL | Status: AC
  Administered 2024-02-15: 30 mL via ORAL
  Filled 2024-02-15: qty 30

## 2024-02-15 MED ORDER — SERTRALINE HCL 50 MG PO TABS
100.0000 mg | ORAL_TABLET | Freq: Every day | ORAL | Status: AC
  Administered 2024-02-16 – 2024-02-20 (×5): 100 mg via ORAL
  Filled 2024-02-15 (×6): qty 2

## 2024-02-15 MED ORDER — TRAMADOL HCL 50 MG PO TABS
50.0000 mg | ORAL_TABLET | Freq: Four times a day (QID) | ORAL | Status: AC | PRN
  Administered 2024-02-16 – 2024-02-19 (×4): 50 mg via ORAL
  Filled 2024-02-15 (×5): qty 1

## 2024-02-15 MED ORDER — ACETAMINOPHEN 325 MG PO TABS
650.0000 mg | ORAL_TABLET | Freq: Three times a day (TID) | ORAL | Status: DC | PRN
  Administered 2024-02-19 – 2024-02-20 (×2): 650 mg via ORAL
  Filled 2024-02-15 (×2): qty 2

## 2024-02-15 MED ORDER — ONDANSETRON HCL 4 MG/2ML IJ SOLN
4.0000 mg | Freq: Four times a day (QID) | INTRAMUSCULAR | Status: DC | PRN
  Administered 2024-02-16 – 2024-02-17 (×2): 4 mg via INTRAVENOUS
  Filled 2024-02-15 (×2): qty 2

## 2024-02-15 MED ORDER — FAMOTIDINE 20 MG PO TABS
20.0000 mg | ORAL_TABLET | Freq: Once | ORAL | Status: AC
  Administered 2024-02-15: 20 mg via ORAL
  Filled 2024-02-15: qty 1

## 2024-02-15 MED ORDER — NICOTINE 14 MG/24HR TD PT24
14.0000 mg | MEDICATED_PATCH | Freq: Every day | TRANSDERMAL | Status: DC
  Administered 2024-02-17: 14 mg via TRANSDERMAL
  Filled 2024-02-15 (×4): qty 1

## 2024-02-15 MED ORDER — HYDROCODONE-ACETAMINOPHEN 5-325 MG PO TABS
1.0000 | ORAL_TABLET | Freq: Three times a day (TID) | ORAL | Status: AC | PRN
  Administered 2024-02-15 – 2024-02-20 (×10): 1 via ORAL
  Filled 2024-02-15 (×11): qty 1

## 2024-02-15 MED ORDER — ONDANSETRON 4 MG PO TBDP: 4.0000 mg | ORAL_TABLET | Freq: Four times a day (QID) | ORAL | Status: DC | PRN

## 2024-02-15 MED ORDER — SODIUM CHLORIDE 0.9 % IV SOLN: INTRAVENOUS | Status: DC

## 2024-02-15 MED ORDER — MORPHINE SULFATE (PF) 4 MG/ML IV SOLN
4.0000 mg | Freq: Once | INTRAVENOUS | Status: AC
  Administered 2024-02-15: 4 mg via INTRAVENOUS
  Filled 2024-02-15: qty 1

## 2024-02-15 NOTE — ED Notes (Signed)
 Pt to room  ginger ale given to pt.  Pt alert. No acute distress.

## 2024-02-15 NOTE — Consult Note (Deleted)
 Subjective:   CC: acute cholecystitis  HPI:  Alicia Holder is a 74 y.o. female who is consulted by Fernand for evaluation of above cc.  Symptoms were first noted a few days ago. Pain is sharp, RUQ.  Associated with N/V, exacerbated by touch.     Past Medical History:  has a past medical history of AAA (abdominal aortic aneurysm), Arthritis, Asthma, Colon cancer (HCC), COPD (chronic obstructive pulmonary disease) (HCC), GERD (gastroesophageal reflux disease), Hypertension, and Sleep apnea.  Past Surgical History:  has a past surgical history that includes Appendectomy; Abdominal hysterectomy; Esophagogastroduodenoscopy (N/A, 01/12/2019); Esophagogastroduodenoscopy (egd) with propofol  (N/A, 01/14/2019); Hemostasis clip placement (01/14/2019); Tonsillectomy; Knee surgery (Right); Tumor excision; Tubal ligation; Cataract extraction w/PHACO (Left, 05/28/2019); Cataract extraction w/PHACO (Right, 06/18/2019); and Intramedullary (im) nail intertrochanteric (Left, 10/25/2023).  Family History: family history includes CAD in her father and mother.  Social History:  reports that she quit smoking about 5 years ago. Her smoking use included cigarettes. She started smoking about 46 years ago. She has a 41 pack-year smoking history. She has never used smokeless tobacco. She reports current alcohol use. She reports that she does not use drugs.  Current Medications:  Prior to Admission medications  Medication Sig Start Date End Date Taking? Authorizing Provider  acetaminophen  (TYLENOL ) 500 MG tablet Take 1,000 mg by mouth every 6 (six) hours as needed for mild pain.    [provider]  albuterol  (VENTOLIN  HFA) 108 (90 Base) MCG/ACT inhaler Inhale 1-2 puffs into the lungs every 4 (four) hours as needed for wheezing or shortness of breath.  07/09/18   [provider]  alendronate  (FOSAMAX ) 70 MG tablet Take 70 mg by mouth once a week. 07/26/17   [provider]  ascorbic acid  (VITAMIN C )  500 MG tablet Take 1 tablet (500 mg total) by mouth 2 (two) times daily. 10/27/23   Rashid, Farhan, MD  buPROPion  (WELLBUTRIN  XL) 150 MG 24 hr tablet Take 150 mg by mouth daily.  02/15/19   [provider]  Cholecalciferol (VITAMIN D3) 1.25 MG (50000 UT) CAPS VITAMIN D3 1.25 MG (50000 UT) CAPS 09/28/23   [provider]  docusate sodium  (COLACE) 100 MG capsule Take 100 mg by mouth daily.    [provider]  enoxaparin  (LOVENOX ) 40 MG/0.4ML injection Inject 0.4 mLs (40 mg total) into the skin daily. 10/27/23   Kip Lynwood Double, PA-C  famotidine  (PEPCID ) 20 MG tablet Take 20 mg by mouth 2 (two) times daily.    [provider]  ipratropium-albuterol  (DUONEB) 0.5-2.5 (3) MG/3ML SOLN Take 3 mLs by nebulization every 4 (four) hours as needed. Patient not taking: Reported on 09/28/2021 01/05/19   Patel, Sona, MD  methocarbamol  (ROBAXIN ) 500 MG tablet Take 1 tablet (500 mg total) by mouth every 6 (six) hours as needed for muscle spasms. 10/27/23   Rashid, Farhan, MD  montelukast  (SINGULAIR ) 10 MG tablet Take 10 mg by mouth at bedtime. Patient not taking: Reported on 10/25/2023 11/14/16   [provider]  nicotine  (NICODERM CQ  - DOSED IN MG/24 HOURS) 14 mg/24hr patch Place 1 patch (14 mg total) onto the skin daily. 10/28/23   Rashid, Farhan, MD  omeprazole (PRILOSEC) 20 MG capsule Take 20 mg by mouth 2 (two) times daily. 09/02/19   [provider]  ondansetron  (ZOFRAN ) 4 MG tablet Take 1 tablet (4 mg total) by mouth every 8 (eight) hours as needed for nausea or vomiting. 11/07/21   Goodman, Graydon, MD  oxyCODONE  (OXY IR/ROXICODONE ) 5  MG immediate release tablet Take 5 mg by mouth 3 (three) times daily as needed for moderate pain or breakthrough pain.     [provider]  pantoprazole  (PROTONIX ) 40 MG tablet TAKE 1 TABLET BY MOUTH EVERY DAY 05/30/19   Corcoran, Melissa C, MD  rosuvastatin  (CRESTOR ) 20 MG tablet Take 20 mg by mouth daily.    [provider]  sertraline  (ZOLOFT ) 100 MG tablet Take 100 mg by mouth daily. 05/22/19   [provider]  TRELEGY ELLIPTA 100-62.5-25 MCG/INH AEPB Inhale 1 puff into the lungs daily.  02/20/19   [provider]  zinc  sulfate, 50mg  elemental zinc , 220 (50 Zn) MG capsule Take 1 capsule (220 mg total) by mouth daily. 10/28/23   Dino Antu, MD    Allergies:  Allergies as of 02/15/2024 - Review Complete 02/15/2024  Allergen Reaction Noted   Aspirin  02/14/2019    ROS:  Pertinent positives and negatives noted in HPI   Objective:     BP 109/81 (BP Location: Left Arm)   Pulse 75   Temp (!) 97.5 F (36.4 C) (Oral)   Resp 18   SpO2 100%    Constitutional :  alert, cooperative, appears stated age, and no distress  Respiratory:  Clear to auscultation bilaterally  Cardiovascular:  Regular rate and rhythm  Gastrointestinal: Soft, no guarding, focal TTP RUQ.   Skin: Cool and moist  Psychiatric: Normal affect, non-agitated, not confused       LABS:     Latest Ref Rng & Units 02/15/2024    2:42 PM 10/24/2023    2:30 PM 11/07/2021   12:58 PM  CMP  Glucose 70 - 99 mg/dL 87  869  867   BUN 8 - 23 mg/dL 14  17  13    Creatinine 0.44 - 1.00 mg/dL 9.46  9.20  9.29   Sodium 135 - 145 mmol/L 135  139  138   Potassium 3.5 - 5.1 mmol/L 4.0  4.1  4.4   Chloride 98 - 111 mmol/L 100  102  102   CO2 22 - 32 mmol/L 26  28  27    Calcium  8.9 - 10.3 mg/dL 9.9  9.1  9.7   Total Protein 6.5 - 8.1 g/dL 7.1   7.8   Total Bilirubin 0.0 - 1.2 mg/dL 0.3   0.9   Alkaline Phos 38 - 126 U/L 67   43   AST 15 - 41 U/L 27   18   ALT 0 - 44 U/L 27   13       Latest Ref Rng & Units 02/15/2024    2:42 PM 10/25/2023    5:43 AM 10/24/2023    2:30 PM  CBC  WBC 4.0 - 10.5 K/uL 9.0  7.1  9.2   Hemoglobin 12.0 - 15.0 g/dL 87.1  88.1  85.1   Hematocrit 36.0 - 46.0 % 40.5  37.2  47.1   Platelets 150 - 400 K/uL 202  171  224      RADS: EXAM: Right Upper Quadrant Abdominal Ultrasound 02/15/2024  07:27:04 PM   TECHNIQUE: Real-time ultrasonography of the right upper quadrant of the abdomen was performed.   COMPARISON: None available.   CLINICAL HISTORY: Biliary colic.   FINDINGS:   LIVER: Normal echogenicity. No intrahepatic biliary ductal dilatation. No evidence of mass. Hepatopetal flow in the portal vein.   BILIARY SYSTEM: The gallbladder wall is thickened and hyperemic. The gallbladder is distended and sludge filled with a 19  mm gallstone seen impacted within the gallbladder neck (image 14). There is trace pericholecystic edema. All together, the findings are in keeping with changes of acute cholecystitis. The common bile duct appears mildly dilated measuring up to 8 mm in diameter within the pancreatic head. The periampullary duct is obscured by overlying bowel gas. No intrahepatic biliary ductal dilatation.   RIGHT KIDNEY: Limited images of the right kidney are unremarkable.   PANCREAS: The pancreas is obscured by overlying bowel gas.   OTHER: No right upper quadrant ascites.   IMPRESSION: 1. Acute cholecystitis with a 19 mm gallstone impacted in the gallbladder neck. 2. Mild common bile duct dilatation to 8 mm without intrahepatic biliary ductal dilatation; the periampullary duct is obscured by overlying bowel gas. If there is clinical concern for choledocholithiasis, MRCP examination is recommended for further evaluation.   Electronically signed by: Dorethia Molt MD 02/15/2024 07:37 PM EST RP Workstation: [id number]  EXAM: CT ABDOMEN AND PELVIS WITH CONTRAST 02/15/2024 06:33:47 PM   TECHNIQUE: CT of the abdomen and pelvis was performed with the administration of 100 mL iohexol  (OMNIPAQUE ) 300 MG/ML solution. Multiplanar reformatted images are provided for review. Automated exposure control, iterative reconstruction, and/or weight-based adjustment of the mA/kV was utilized to reduce the radiation dose to as low as reasonably achievable.    COMPARISON: CT dated 01/11/2019 and CT dated 02/25/2021.   CLINICAL HISTORY: Generalized abdominal pain, right sided greater.   FINDINGS:   LOWER CHEST: Scarring, bronchiolectasis, and emphysema in the lower lungs. Subpleural nodule in the right lower lobe measuring 12 mm on series 4 image 25.   LIVER: Hepatic steatosis.   GALLBLADDER AND BILE DUCTS: Marked gallbladder distention with diffuse gallbladder wall thickening with stone in the gallbladder neck measuring 21 mm. The common bile duct is dilated up to 11 mm without radiopaque stone in the common bile duct.   SPLEEN: No acute abnormality.   PANCREAS: No acute abnormality.   ADRENAL GLANDS: No acute abnormality.   KIDNEYS, URETERS AND BLADDER: No stones in the kidneys or ureters. No hydronephrosis. No perinephric or periureteral stranding. Urinary bladder is unremarkable.   GI AND BOWEL: Moderate hiatal hernia. Stomach demonstrates no acute abnormality. There is no bowel obstruction. Appendectomy.   PERITONEUM AND RETROPERITONEUM: No ascites. No free air.   VASCULATURE: Aorta is normal in caliber. Aortic atherosclerotic calcification.   LYMPH NODES: No lymphadenopathy.   REPRODUCTIVE ORGANS: Hysterectomy.   BONES AND SOFT TISSUES: Severe compression fracture of L1 with retropulsion of the posterior L1 vertebral body causing severe spinal canal narrowing. This is unchanged from CT 02/25/2021. Chronic severe endplate compression of L3. ORIF right femur fracture. No focal soft tissue abnormality.   IMPRESSION: 1. Acute cholecystitis secondary to an 11 mm gallstone in the gallbladder neck. 2. 12 mm subpleural right lower lobe pulmonary nodule; recommend prompt non-contrast chest CT for full evaluation per Fleischner Society Guidelines.   Electronically signed by: Norman Gatlin MD 02/15/2024 06:44 PM EST RP Workstation: [id number]     Assessment:      Acute cholecysitis Lung nodule- high risk  screening, missed past few years.   COPD   Plan:      Discussed the risk of surgery including post-op infxn, seroma, biloma, chronic pain, poor-delayed wound healing, retained gallstone, conversion to open procedure, post-op SBO or ileus, and need for additional procedures to address said risks.  The risks of general anesthetic including MI, CVA, sudden death or even reaction to anesthetic medications also discussed. Alternatives include continued observation.  Benefits include possible symptom relief, prevention of complications including acute cholecystitis, pancreatitis.  Typical post operative recovery of 3-5 days rest, continued pain in area and incision sites, possible loose stools up to 4-6 weeks, also discussed.  The patient understands the risks, any and all questions were answered to the patient's satisfaction.  To OR for robo lap chole.  IVF, rocephin  in the meantime.  F/u chest CT for lung nodule.  Ordered  COPD- continue home meds Tobacco dependence- nicotine  patch.  labs/images/medications/previous chart entries reviewed personally and relevant changes/updates noted above.

## 2024-02-15 NOTE — ED Triage Notes (Signed)
 Pt to ED via POV from home. Pt reports RLQ pain x4 days with N/V. Pt seen at doctor and urine test negative. Pt reports appendix removed.

## 2024-02-15 NOTE — ED Provider Notes (Signed)
 "  [facility] Provider Note    Event Date/Time   First MD Initiated Contact with Patient 02/15/24 1637     (approximate)   History   Abdominal Pain   HPI  Alicia Holder is a 74 y.o. female presenting with concern of lower abdominal pain.  Apparently having symptoms ongoing for about 3 to 4 days gradually worsening.  She describes as lower abdominal pain but primarily located in the right upper quadrant.  Seems to worsen with meals, but she states whenever she does not eat she also has pain.  Affiliated nausea and 1 episode of vomiting this morning.  Was seen by her primary doctor yesterday, had evaluation done diet and was told to get imaging done this morning but ended up missing the appointment.  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  Denies any associated diarrhea constipation.  She states that she does have a history of a known aortic aneurysm and also COPD denies other medical history.  Has tried taking Tylenol  to help with symptoms with minimal improvement.     Physical Exam   Triage Vital Signs: ED Triage Vitals [02/15/24 1442]  Encounter Vitals Group     BP 131/85     Girls Systolic BP Percentile      Girls Diastolic BP Percentile      Boys Systolic BP Percentile      Boys Diastolic BP Percentile      Pulse Rate 82     Resp 18     Temp (!) 97.5 F (36.4 C)     Temp Source Oral     SpO2 98 %     Weight      Height      Head Circumference      Peak Flow      Pain Score 8     Pain Loc      Pain Education      Exclude from Growth Chart     Most recent vital signs: Vitals:   02/15/24 1442 02/15/24 1903  BP: 131/85 109/81  Pulse: 82 75  Resp: 18 18  Temp: (!) 97.5 F (36.4 C)   SpO2: 98% 100%     General: Awake, no distress.  CV:  Good peripheral perfusion.   Resp:  Normal effort.  Abd:  No distention.  Soft with generalized discomfort and grimacing to palpation, seems to be greatest  in the right upper quadrant but present everywhere Other:     ED Results / Procedures / Treatments   Labs (all labs ordered are listed, but only abnormal results are displayed) Labs Reviewed  LIPASE, BLOOD  COMPREHENSIVE METABOLIC PANEL WITH GFR  CBC  BASIC METABOLIC PANEL WITH GFR  CBC  HEPATIC FUNCTION PANEL  TROPONIN T, HIGH SENSITIVITY     EKG     RADIOLOGY   PROCEDURES:  Critical Care performed: No  Procedures   MEDICATIONS ORDERED IN ED: Medications  buPROPion  (WELLBUTRIN  XL) 24 hr tablet 150 mg (has no administration in time range)  nicotine  (NICODERM CQ  - dosed in mg/24 hours) patch 14 mg (has no administration in time range)  sertraline  (ZOLOFT ) tablet 100 mg (has no administration in time range)  pantoprazole  (PROTONIX ) EC tablet 40 mg (has no administration in time range)  methocarbamol  (ROBAXIN ) tablet 500 mg (has no administration in time range)  albuterol  (PROVENTIL ) (2.5 MG/3ML) 0.083% nebulizer solution 3 mL (has no administration in time range)  budesonide -glycopyrrolate -formoterol  (  BREZTRI ) 160-9-4.8 MCG/ACT inhaler 2 puff (has no administration in time range)  acetaminophen  (TYLENOL ) tablet 650 mg (has no administration in time range)  traMADol  (ULTRAM ) tablet 50 mg (has no administration in time range)  HYDROcodone -acetaminophen  (NORCO/VICODIN) 5-325 MG per tablet 1 tablet (has no administration in time range)  morphine  (PF) 2 MG/ML injection 2 mg (has no administration in time range)  docusate sodium  (COLACE) capsule 100 mg (has no administration in time range)  ondansetron  (ZOFRAN -ODT) disintegrating tablet 4 mg (has no administration in time range)    Or  ondansetron  (ZOFRAN ) injection 4 mg (has no administration in time range)  0.9 %  sodium chloride  infusion (has no administration in time range)  cefTRIAXone  (ROCEPHIN ) 2 g in sodium chloride  0.9 % 100 mL IVPB (has no administration in time range)  alum & mag hydroxide-simeth  (MAALOX/MYLANTA) 200-200-20 MG/5ML suspension 30 mL (30 mLs Oral Given 02/15/24 1728)    And  lidocaine  (XYLOCAINE ) 2 % viscous mouth solution 15 mL (15 mLs Oral Given 02/15/24 1728)  famotidine  (PEPCID ) tablet 20 mg (20 mg Oral Given 02/15/24 1729)  morphine  (PF) 4 MG/ML injection 4 mg (4 mg Intravenous Given 02/15/24 1727)  ondansetron  (ZOFRAN ) injection 4 mg (4 mg Intravenous Given 02/15/24 1726)  iohexol  (OMNIPAQUE ) 300 MG/ML solution 100 mL (100 mLs Intravenous Contrast Given 02/15/24 1822)     IMPRESSION / MDM / ASSESSMENT AND PLAN / ED COURSE  I reviewed the triage vital signs and the nursing notes.                               Patient's presentation is most consistent with acute complicated illness / injury requiring diagnostic workup.  74 year old female presenting today with concern of generalized abdominal pain seemingly greatest in her right upper quadrant although she states that it is in the right lower quadrant.  Her labs here are fortunately reassuring, she does have a history of an appendectomy as well as a carcinoid tumor removal.  My primary suspicion is peptic ulcer disease versus possibly biliary pathology versus diverticulitis.  Lipase here is normal, will initiate CT imaging as this was ordered by outpatient provider, but if this is negative may warrant ultrasound imaging to further assess.  Will attempt symptomatic management and reassess afterwards.  She does have a history of an aortic aneurysm but I feel aortic pathology is unlikely given presentation at this time.   Clinical Course as of 02/15/24 2046  Thu Feb 15, 2024  8144 CT with evidence of acute cholecystitis, patient does continue to have discomfort although significantly improved from prior.  Following going to discussed with general surgery, and also obtaining biliary ultrasound. [SK]    Clinical Course User Index [SK] Fernand Rossie HERO, MD     FINAL CLINICAL IMPRESSION(S) / ED DIAGNOSES   Final diagnoses:   Cholecystitis     Rx / DC Orders   ED Discharge Orders     None        Note:  This document was prepared using Dragon voice recognition software and may include unintentional dictation errors.   Fernand Rossie HERO, MD 02/15/24 2046  "

## 2024-02-16 ENCOUNTER — Inpatient Hospital Stay

## 2024-02-16 ENCOUNTER — Ambulatory Visit: Payer: Self-pay | Admitting: Emergency Medicine

## 2024-02-16 ENCOUNTER — Inpatient Hospital Stay: Admitting: Anesthesiology

## 2024-02-16 ENCOUNTER — Encounter
Admission: EM | Disposition: A | Discharge status: Home / Self Care | Payer: Self-pay | Source: Home / Self Care | Attending: Surgery

## 2024-02-16 HISTORY — PX: LAPAROSCOPIC LYSIS OF ADHESIONS: SHX5905

## 2024-02-16 LAB — HEPATIC FUNCTION PANEL
ALT: 25 U/L (ref 0–44)
AST: 23 U/L (ref 15–41)
Albumin: 3.1 g/dL — ABNORMAL LOW (ref 3.5–5.0)
Alkaline Phosphatase: 61 U/L (ref 38–126)
Bilirubin, Direct: 0.2 mg/dL (ref 0.0–0.2)
Indirect Bilirubin: 0.1 mg/dL — ABNORMAL LOW (ref 0.3–0.9)
Total Bilirubin: 0.2 mg/dL (ref 0.0–1.2)
Total Protein: 5.7 g/dL — ABNORMAL LOW (ref 6.5–8.1)

## 2024-02-16 LAB — CBC
HCT: 34.6 % — ABNORMAL LOW (ref 36.0–46.0)
Hemoglobin: 11 g/dL — ABNORMAL LOW (ref 12.0–15.0)
MCH: 28.4 pg (ref 26.0–34.0)
MCHC: 31.8 g/dL (ref 30.0–36.0)
MCV: 89.4 fL (ref 80.0–100.0)
Platelets: 163 K/uL (ref 150–400)
RBC: 3.87 MIL/uL (ref 3.87–5.11)
RDW: 15.5 % (ref 11.5–15.5)
WBC: 4.4 K/uL (ref 4.0–10.5)
nRBC: 0 % (ref 0.0–0.2)

## 2024-02-16 LAB — BASIC METABOLIC PANEL WITH GFR
Anion gap: 6 (ref 5–15)
BUN: 9 mg/dL (ref 8–23)
CO2: 28 mmol/L (ref 22–32)
Calcium: 8.7 mg/dL — ABNORMAL LOW (ref 8.9–10.3)
Chloride: 103 mmol/L (ref 98–111)
Creatinine, Ser: 0.43 mg/dL — ABNORMAL LOW (ref 0.44–1.00)
GFR, Estimated: >60 mL/min
Glucose, Bld: 83 mg/dL (ref 70–99)
Potassium: 4.3 mmol/L (ref 3.5–5.1)
Sodium: 137 mmol/L (ref 135–145)

## 2024-02-16 MED ORDER — ONDANSETRON HCL 4 MG/2ML IJ SOLN
INTRAMUSCULAR | Status: AC
  Filled 2024-02-16: qty 2

## 2024-02-16 MED ORDER — DROPERIDOL 2.5 MG/ML IJ SOLN: 0.6250 mg | Freq: Once | INTRAMUSCULAR | Status: DC | PRN

## 2024-02-16 MED ORDER — FENTANYL CITRATE (PF) 100 MCG/2ML IJ SOLN: 25.0000 ug | INTRAMUSCULAR | Status: DC | PRN

## 2024-02-16 MED ORDER — DEXMEDETOMIDINE HCL IN NACL 80 MCG/20ML IV SOLN
INTRAVENOUS | Status: DC | PRN
  Administered 2024-02-16 (×2): 4 ug via INTRAVENOUS

## 2024-02-16 MED ORDER — ALBUTEROL SULFATE HFA 108 (90 BASE) MCG/ACT IN AERS
INHALATION_SPRAY | RESPIRATORY_TRACT | Status: AC
  Filled 2024-02-16: qty 6.7

## 2024-02-16 MED ORDER — LISINOPRIL 20 MG PO TABS
20.0000 mg | ORAL_TABLET | Freq: Every day | ORAL | Status: AC
  Administered 2024-02-16 – 2024-02-20 (×5): 20 mg via ORAL
  Filled 2024-02-16 (×6): qty 1

## 2024-02-16 MED ORDER — FENTANYL CITRATE (PF) 100 MCG/2ML IJ SOLN
INTRAMUSCULAR | Status: DC | PRN
  Administered 2024-02-16 (×2): 50 ug via INTRAVENOUS

## 2024-02-16 MED ORDER — FENTANYL CITRATE (PF) 100 MCG/2ML IJ SOLN
INTRAMUSCULAR | Status: AC
  Filled 2024-02-16: qty 2

## 2024-02-16 MED ORDER — PHENYLEPHRINE 80 MCG/ML (10ML) SYRINGE FOR IV PUSH (FOR BLOOD PRESSURE SUPPORT)
PREFILLED_SYRINGE | INTRAVENOUS | Status: DC | PRN
  Administered 2024-02-16 (×3): 80 ug via INTRAVENOUS

## 2024-02-16 MED ORDER — BUPIVACAINE-EPINEPHRINE (PF) 0.5% -1:200000 IJ SOLN
INTRAMUSCULAR | Status: AC
  Filled 2024-02-16: qty 30

## 2024-02-16 MED ORDER — LACTATED RINGERS IV SOLN: INTRAVENOUS | Status: DC | PRN

## 2024-02-16 MED ORDER — PHENYLEPHRINE HCL-NACL 20-0.9 MG/250ML-% IV SOLN
INTRAVENOUS | Status: DC | PRN
  Administered 2024-02-16: 30 ug/min via INTRAVENOUS

## 2024-02-16 MED ORDER — BUPIVACAINE-EPINEPHRINE (PF) 0.5% -1:200000 IJ SOLN
INTRAMUSCULAR | Status: DC | PRN
  Administered 2024-02-16: 30 mL

## 2024-02-16 MED ORDER — ALBUMIN HUMAN 5 % IV SOLN
INTRAVENOUS | Status: AC
  Filled 2024-02-16: qty 250

## 2024-02-16 MED ORDER — INDOCYANINE GREEN 25 MG IJ SOLR
1.2500 mg | Freq: Once | INTRAMUSCULAR | Status: AC
  Administered 2024-02-16: 1.25 mg via INTRAVENOUS

## 2024-02-16 MED ORDER — SUGAMMADEX SODIUM 200 MG/2ML IV SOLN
INTRAVENOUS | Status: DC | PRN
  Administered 2024-02-16: 100 mg via INTRAVENOUS

## 2024-02-16 MED ORDER — ROCURONIUM BROMIDE 100 MG/10ML IV SOLN
INTRAVENOUS | Status: DC | PRN
  Administered 2024-02-16: 50 mg via INTRAVENOUS
  Administered 2024-02-16: 30 mg via INTRAVENOUS
  Administered 2024-02-16: 5 mg via INTRAVENOUS

## 2024-02-16 MED ORDER — LIDOCAINE HCL (CARDIAC) PF 100 MG/5ML IV SOSY
PREFILLED_SYRINGE | INTRAVENOUS | Status: DC | PRN
  Administered 2024-02-16: 100 mg via INTRAVENOUS

## 2024-02-16 MED ORDER — PROPOFOL 10 MG/ML IV BOLUS
INTRAVENOUS | Status: DC | PRN
  Administered 2024-02-16: 100 mg via INTRAVENOUS

## 2024-02-16 MED ORDER — DEXAMETHASONE SOD PHOSPHATE PF 10 MG/ML IJ SOLN
INTRAMUSCULAR | Status: DC | PRN
  Administered 2024-02-16: 10 mg via INTRAVENOUS

## 2024-02-16 MED ORDER — ALBUMIN HUMAN 5 % IV SOLN: INTRAVENOUS | Status: DC | PRN

## 2024-02-16 MED ORDER — ONDANSETRON HCL 4 MG/2ML IJ SOLN
INTRAMUSCULAR | Status: DC | PRN
  Administered 2024-02-16: 4 mg via INTRAVENOUS

## 2024-02-16 MED ORDER — ALBUTEROL SULFATE HFA 108 (90 BASE) MCG/ACT IN AERS
INHALATION_SPRAY | RESPIRATORY_TRACT | Status: DC | PRN
  Administered 2024-02-16: 4 via RESPIRATORY_TRACT
  Administered 2024-02-16: 2 via RESPIRATORY_TRACT

## 2024-02-16 MED ORDER — 0.9 % SODIUM CHLORIDE (POUR BTL) OPTIME
TOPICAL | Status: DC | PRN
  Administered 2024-02-16: 500 mL

## 2024-02-16 NOTE — Anesthesia Procedure Notes (Signed)
 Procedure Name: Intubation Date/Time: 02/16/2024 12:45 PM  Performed by: Ledora Duncan, CRNAPre-anesthesia Checklist: Patient identified, Emergency Drugs available, Suction available and Patient being monitored Patient Re-evaluated:Patient Re-evaluated prior to induction Oxygen Delivery Method: Circle system utilized Preoxygenation: Pre-oxygenation with 100% oxygen Induction Type: IV induction Ventilation: Mask ventilation without difficulty Laryngoscope Size: McGrath and 3 Grade View: Grade I Tube type: Oral Tube size: 7.0 mm Number of attempts: 1 Airway Equipment and Method: Stylet and Oral airway Placement Confirmation: ETT inserted through vocal cords under direct vision, positive ETCO2 and breath sounds checked- equal and bilateral Secured at: 21 cm Tube secured with: Tape Dental Injury: Teeth and Oropharynx as per pre-operative assessment  Comments: Atraumatic intubation

## 2024-02-16 NOTE — H&P (Signed)
 Subjective:   CC: acute cholecystitis  HPI:  Alicia Holder is a 74 y.o. female who is consulted by [facility] for evaluation of above cc.  Symptoms were first noted a few days ago. Pain is sharp, RUQ.  Associated with N/V, exacerbated by touch.     Past Medical History:  has a past medical history of AAA (abdominal aortic aneurysm), Arthritis, Asthma, Colon cancer (HCC), COPD (chronic obstructive pulmonary disease) (HCC), GERD (gastroesophageal reflux disease), Hypertension, and Sleep apnea.  Past Surgical History:  has a past surgical history that includes Appendectomy; Abdominal hysterectomy; Esophagogastroduodenoscopy (N/A, 01/12/2019); Esophagogastroduodenoscopy (egd) with propofol  (N/A, 01/14/2019); Hemostasis clip placement (01/14/2019); Tonsillectomy; Knee surgery (Right); Tumor excision; Tubal ligation; Cataract extraction w/PHACO (Left, 05/28/2019); Cataract extraction w/PHACO (Right, 06/18/2019); and Intramedullary (im) nail intertrochanteric (Left, 10/25/2023).  Family History: family history includes CAD in her father and mother.  Social History:  reports that she quit smoking about 5 years ago. Her smoking use included cigarettes. She started smoking about 46 years ago. She has a 41 pack-year smoking history. She has never used smokeless tobacco. She reports current alcohol use. She reports that she does not use drugs.  Current Medications:  Prior to Admission medications  Medication Sig Start Date End Date Taking? Authorizing Provider  acetaminophen  (TYLENOL ) 500 MG tablet Take 1,000 mg by mouth every 6 (six) hours as needed for mild pain.    [provider]  albuterol  (VENTOLIN  HFA) 108 (90 Base) MCG/ACT inhaler Inhale 1-2 puffs into the lungs every 4 (four) hours as needed for wheezing or shortness of breath.  07/09/18   [provider]  alendronate  (FOSAMAX ) 70 MG tablet Take 70 mg by mouth once a week. 07/26/17   [provider]  ascorbic acid  (VITAMIN C )  500 MG tablet Take 1 tablet (500 mg total) by mouth 2 (two) times daily. 10/27/23   Rashid, Farhan, MD  buPROPion  (WELLBUTRIN  XL) 150 MG 24 hr tablet Take 150 mg by mouth daily.  02/15/19   [provider]  Cholecalciferol (VITAMIN D3) 1.25 MG (50000 UT) CAPS VITAMIN D3 1.25 MG (50000 UT) CAPS 09/28/23   [provider]  docusate sodium  (COLACE) 100 MG capsule Take 100 mg by mouth daily.    [provider]  enoxaparin  (LOVENOX ) 40 MG/0.4ML injection Inject 0.4 mLs (40 mg total) into the skin daily. 10/27/23   Kip Lynwood Double, PA-C  famotidine  (PEPCID ) 20 MG tablet Take 20 mg by mouth 2 (two) times daily.    [provider]  ipratropium-albuterol  (DUONEB) 0.5-2.5 (3) MG/3ML SOLN Take 3 mLs by nebulization every 4 (four) hours as needed. Patient not taking: Reported on 09/28/2021 01/05/19   Patel, Sona, MD  methocarbamol  (ROBAXIN ) 500 MG tablet Take 1 tablet (500 mg total) by mouth every 6 (six) hours as needed for muscle spasms. 10/27/23   Rashid, Farhan, MD  montelukast  (SINGULAIR ) 10 MG tablet Take 10 mg by mouth at bedtime. Patient not taking: Reported on 10/25/2023 11/14/16   [provider]  nicotine  (NICODERM CQ  - DOSED IN MG/24 HOURS) 14 mg/24hr patch Place 1 patch (14 mg total) onto the skin daily. 10/28/23   Rashid, Farhan, MD  omeprazole (PRILOSEC) 20 MG capsule Take 20 mg by mouth 2 (two) times daily. 09/02/19   [provider]  ondansetron  (ZOFRAN ) 4 MG tablet Take 1 tablet (4 mg total) by mouth every 8 (eight) hours as needed for nausea or vomiting. 11/07/21   Goodman, Graydon, MD  oxyCODONE  (OXY IR/ROXICODONE ) 5  MG immediate release tablet Take 5 mg by mouth 3 (three) times daily as needed for moderate pain or breakthrough pain.     [provider]  pantoprazole  (PROTONIX ) 40 MG tablet TAKE 1 TABLET BY MOUTH EVERY DAY 05/30/19   Corcoran, Melissa C, MD  rosuvastatin  (CRESTOR ) 20 MG tablet Take 20 mg by mouth daily.    [provider]  sertraline  (ZOLOFT ) 100 MG tablet Take 100 mg by mouth daily. 05/22/19   [provider]  TRELEGY ELLIPTA 100-62.5-25 MCG/INH AEPB Inhale 1 puff into the lungs daily.  02/20/19   [provider]  zinc  sulfate, 50mg  elemental zinc , 220 (50 Zn) MG capsule Take 1 capsule (220 mg total) by mouth daily. 10/28/23   Dino Antu, MD    Allergies:  Allergies as of 02/15/2024 - Review Complete 02/15/2024  Allergen Reaction Noted   Aspirin  02/14/2019    ROS:  Pertinent positives and negatives noted in HPI   Objective:     BP 100/62 (BP Location: Left Arm)   Pulse 63   Temp 98.4 F (36.9 C)   Resp 18   Ht 5' 4 (1.626 m)   Wt 50 kg   SpO2 91%   BMI 18.92 kg/m    Constitutional :  alert, cooperative, appears stated age, and no distress  Respiratory:  Clear to auscultation bilaterally  Cardiovascular:  Regular rate and rhythm  Gastrointestinal: Soft, no guarding, focal TTP RUQ.   Skin: Cool and moist  Psychiatric: Normal affect, non-agitated, not confused       LABS:     Latest Ref Rng & Units 02/16/2024    5:48 AM 02/15/2024    2:42 PM 10/24/2023    2:30 PM  CMP  Glucose 70 - 99 mg/dL 83  87  869   BUN 8 - 23 mg/dL 9  14  17    Creatinine 0.44 - 1.00 mg/dL 9.56  9.46  9.20   Sodium 135 - 145 mmol/L 137  135  139   Potassium 3.5 - 5.1 mmol/L 4.3  4.0  4.1   Chloride 98 - 111 mmol/L 103  100  102   CO2 22 - 32 mmol/L 28  26  28    Calcium  8.9 - 10.3 mg/dL 8.7  9.9  9.1   Total Protein 6.5 - 8.1 g/dL 5.7  7.1    Total Bilirubin 0.0 - 1.2 mg/dL 0.2  0.3    Alkaline Phos 38 - 126 U/L 61  67    AST 15 - 41 U/L 23  27    ALT 0 - 44 U/L 25  27        Latest Ref Rng & Units 02/16/2024    5:48 AM 02/15/2024    2:42 PM 10/25/2023    5:43 AM  CBC  WBC 4.0 - 10.5 K/uL 4.4  9.0  7.1   Hemoglobin 12.0 - 15.0 g/dL 88.9  87.1  88.1   Hematocrit 36.0 - 46.0 % 34.6  40.5  37.2   Platelets 150 - 400 K/uL 163  202  171      RADS: EXAM: Right Upper  Quadrant Abdominal Ultrasound 02/15/2024 07:27:04 PM   TECHNIQUE: Real-time ultrasonography of the right upper quadrant of the abdomen was performed.   COMPARISON: None available.   CLINICAL HISTORY: Biliary colic.   FINDINGS:   LIVER: Normal echogenicity. No intrahepatic biliary ductal dilatation. No evidence of mass. Hepatopetal flow in the portal vein.   BILIARY SYSTEM: The  gallbladder wall is thickened and hyperemic. The gallbladder is distended and sludge filled with a 19 mm gallstone seen impacted within the gallbladder neck (image 14). There is trace pericholecystic edema. All together, the findings are in keeping with changes of acute cholecystitis. The common bile duct appears mildly dilated measuring up to 8 mm in diameter within the pancreatic head. The periampullary duct is obscured by overlying bowel gas. No intrahepatic biliary ductal dilatation.   RIGHT KIDNEY: Limited images of the right kidney are unremarkable.   PANCREAS: The pancreas is obscured by overlying bowel gas.   OTHER: No right upper quadrant ascites.   IMPRESSION: 1. Acute cholecystitis with a 19 mm gallstone impacted in the gallbladder neck. 2. Mild common bile duct dilatation to 8 mm without intrahepatic biliary ductal dilatation; the periampullary duct is obscured by overlying bowel gas. If there is clinical concern for choledocholithiasis, MRCP examination is recommended for further evaluation.   Electronically signed by: Dorethia Molt MD 02/15/2024 07:37 PM EST RP Workstation: [id number]  EXAM: CT ABDOMEN AND PELVIS WITH CONTRAST 02/15/2024 06:33:47 PM   TECHNIQUE: CT of the abdomen and pelvis was performed with the administration of 100 mL iohexol  (OMNIPAQUE ) 300 MG/ML solution. Multiplanar reformatted images are provided for review. Automated exposure control, iterative reconstruction, and/or weight-based adjustment of the mA/kV was utilized to reduce the radiation dose to as  low as reasonably achievable.   COMPARISON: CT dated 01/11/2019 and CT dated 02/25/2021.   CLINICAL HISTORY: Generalized abdominal pain, right sided greater.   FINDINGS:   LOWER CHEST: Scarring, bronchiolectasis, and emphysema in the lower lungs. Subpleural nodule in the right lower lobe measuring 12 mm on series 4 image 25.   LIVER: Hepatic steatosis.   GALLBLADDER AND BILE DUCTS: Marked gallbladder distention with diffuse gallbladder wall thickening with stone in the gallbladder neck measuring 21 mm. The common bile duct is dilated up to 11 mm without radiopaque stone in the common bile duct.   SPLEEN: No acute abnormality.   PANCREAS: No acute abnormality.   ADRENAL GLANDS: No acute abnormality.   KIDNEYS, URETERS AND BLADDER: No stones in the kidneys or ureters. No hydronephrosis. No perinephric or periureteral stranding. Urinary bladder is unremarkable.   GI AND BOWEL: Moderate hiatal hernia. Stomach demonstrates no acute abnormality. There is no bowel obstruction. Appendectomy.   PERITONEUM AND RETROPERITONEUM: No ascites. No free air.   VASCULATURE: Aorta is normal in caliber. Aortic atherosclerotic calcification.   LYMPH NODES: No lymphadenopathy.   REPRODUCTIVE ORGANS: Hysterectomy.   BONES AND SOFT TISSUES: Severe compression fracture of L1 with retropulsion of the posterior L1 vertebral body causing severe spinal canal narrowing. This is unchanged from CT 02/25/2021. Chronic severe endplate compression of L3. ORIF right femur fracture. No focal soft tissue abnormality.   IMPRESSION: 1. Acute cholecystitis secondary to an 11 mm gallstone in the gallbladder neck. 2. 12 mm subpleural right lower lobe pulmonary nodule; recommend prompt non-contrast chest CT for full evaluation per Fleischner Society Guidelines.   Electronically signed by: Norman Gatlin MD 02/15/2024 06:44 PM EST RP Workstation: [id number]     Assessment:      Acute  cholecysitis Lung nodule- high risk screening, missed past few years.   COPD   Plan:      Discussed the risk of surgery including post-op infxn, seroma, biloma, chronic pain, poor-delayed wound healing, retained gallstone, conversion to open procedure, post-op SBO or ileus, and need for additional procedures to address said risks.  The risks of general anesthetic including  MI, CVA, sudden death or even reaction to anesthetic medications also discussed. Alternatives include continued observation.  Benefits include possible symptom relief, prevention of complications including acute cholecystitis, pancreatitis.  Typical post operative recovery of 3-5 days rest, continued pain in area and incision sites, possible loose stools up to 4-6 weeks, also discussed.  The patient understands the risks, any and all questions were answered to the patient's satisfaction.  To OR for robo lap chole.  IVF, rocephin  in the meantime.  F/u chest CT for lung nodule.  Ordered  COPD- continue home meds Tobacco dependence- nicotine  patch.  labs/images/medications/previous chart entries reviewed personally and relevant changes/updates noted above.

## 2024-02-16 NOTE — Transfer of Care (Signed)
 Immediate Anesthesia Transfer of Care Note  Patient: Alicia Holder  Procedure(s) Performed: CHOLECYSTECTOMY, ROBOT-ASSISTED, LAPAROSCOPIC LYSIS, ADHESIONS, LAPAROSCOPIC (Abdomen)  Patient Location: PACU  Anesthesia Type:General  Level of Consciousness: alert  and drowsy  Airway & Oxygen Therapy: Patient Spontanous Breathing and Patient connected to face mask oxygen  Post-op Assessment: Report given to RN and Post -op Vital signs reviewed and stable  Post vital signs: Reviewed and stable  Last Vitals:  Vitals Value Taken Time  BP 117/74 02/16/24 16:17  Temp 36.2 C 02/16/24 16:17  Pulse 81 02/16/24 16:23  Resp 14 02/16/24 16:23  SpO2 100 % 02/16/24 16:23  Vitals shown include unfiled device data.  Last Pain:  Vitals:   02/16/24 1123  TempSrc: Temporal  PainSc: 7       Patients Stated Pain Goal: 0 (02/16/24 0420)  Complications: No notable events documented.

## 2024-02-16 NOTE — Progress Notes (Signed)
Pt otf for procedure.

## 2024-02-16 NOTE — Anesthesia Postprocedure Evaluation (Signed)
"   Anesthesia Post Note  Patient: Alicia Holder  Procedure(s) Performed: CHOLECYSTECTOMY, ROBOT-ASSISTED, LAPAROSCOPIC LYSIS, ADHESIONS, LAPAROSCOPIC (Abdomen)  Patient location during evaluation: PACU Anesthesia Type: General Level of consciousness: awake and alert Pain management: pain level controlled Vital Signs Assessment: post-procedure vital signs reviewed and stable Respiratory status: spontaneous breathing, nonlabored ventilation, respiratory function stable and patient connected to nasal cannula oxygen Cardiovascular status: blood pressure returned to baseline and stable Postop Assessment: no apparent nausea or vomiting Anesthetic complications: no   No notable events documented.   Last Vitals:  Vitals:   02/16/24 1715 02/16/24 1738  BP: 116/71 107/68  Pulse: 76 76  Resp: 12 16  Temp:  36.4 C  SpO2: 94% 94%    Last Pain:  Vitals:   02/16/24 1738  TempSrc: Axillary  PainSc:                  Prentice Murphy      "

## 2024-02-16 NOTE — H&P (Deleted)
 Subjective:   CC: acute cholecystitis  HPI:  Alicia Holder is a 74 y.o. female who is consulted by Fernand for evaluation of above cc.  Symptoms were first noted a few days ago. Pain is sharp, RUQ.  Associated with N/V, exacerbated by touch.     Past Medical History:  has a past medical history of AAA (abdominal aortic aneurysm), Arthritis, Asthma, Colon cancer (HCC), COPD (chronic obstructive pulmonary disease) (HCC), GERD (gastroesophageal reflux disease), Hypertension, and Sleep apnea.  Past Surgical History:  has a past surgical history that includes Appendectomy; Abdominal hysterectomy; Esophagogastroduodenoscopy (N/A, 01/12/2019); Esophagogastroduodenoscopy (egd) with propofol  (N/A, 01/14/2019); Hemostasis clip placement (01/14/2019); Tonsillectomy; Knee surgery (Right); Tumor excision; Tubal ligation; Cataract extraction w/PHACO (Left, 05/28/2019); Cataract extraction w/PHACO (Right, 06/18/2019); and Intramedullary (im) nail intertrochanteric (Left, 10/25/2023).  Family History: family history includes CAD in her father and mother.  Social History:  reports that she quit smoking about 5 years ago. Her smoking use included cigarettes. She started smoking about 46 years ago. She has a 41 pack-year smoking history. She has never used smokeless tobacco. She reports current alcohol use. She reports that she does not use drugs.  Current Medications:  Prior to Admission medications  Medication Sig Start Date End Date Taking? Authorizing Provider  acetaminophen  (TYLENOL ) 500 MG tablet Take 1,000 mg by mouth every 6 (six) hours as needed for mild pain.    [provider]  albuterol  (VENTOLIN  HFA) 108 (90 Base) MCG/ACT inhaler Inhale 1-2 puffs into the lungs every 4 (four) hours as needed for wheezing or shortness of breath.  07/09/18   [provider]  alendronate  (FOSAMAX ) 70 MG tablet Take 70 mg by mouth once a week. 07/26/17   [provider]  ascorbic acid  (VITAMIN C )  500 MG tablet Take 1 tablet (500 mg total) by mouth 2 (two) times daily. 10/27/23   Rashid, Farhan, MD  buPROPion  (WELLBUTRIN  XL) 150 MG 24 hr tablet Take 150 mg by mouth daily.  02/15/19   [provider]  Cholecalciferol (VITAMIN D3) 1.25 MG (50000 UT) CAPS VITAMIN D3 1.25 MG (50000 UT) CAPS 09/28/23   [provider]  docusate sodium  (COLACE) 100 MG capsule Take 100 mg by mouth daily.    [provider]  enoxaparin  (LOVENOX ) 40 MG/0.4ML injection Inject 0.4 mLs (40 mg total) into the skin daily. 10/27/23   Kip Lynwood Double, PA-C  famotidine  (PEPCID ) 20 MG tablet Take 20 mg by mouth 2 (two) times daily.    [provider]  ipratropium-albuterol  (DUONEB) 0.5-2.5 (3) MG/3ML SOLN Take 3 mLs by nebulization every 4 (four) hours as needed. Patient not taking: Reported on 09/28/2021 01/05/19   Patel, Sona, MD  methocarbamol  (ROBAXIN ) 500 MG tablet Take 1 tablet (500 mg total) by mouth every 6 (six) hours as needed for muscle spasms. 10/27/23   Rashid, Farhan, MD  montelukast  (SINGULAIR ) 10 MG tablet Take 10 mg by mouth at bedtime. Patient not taking: Reported on 10/25/2023 11/14/16   [provider]  nicotine  (NICODERM CQ  - DOSED IN MG/24 HOURS) 14 mg/24hr patch Place 1 patch (14 mg total) onto the skin daily. 10/28/23   Rashid, Farhan, MD  omeprazole (PRILOSEC) 20 MG capsule Take 20 mg by mouth 2 (two) times daily. 09/02/19   [provider]  ondansetron  (ZOFRAN ) 4 MG tablet Take 1 tablet (4 mg total) by mouth every 8 (eight) hours as needed for nausea or vomiting. 11/07/21   Goodman, Graydon, MD  oxyCODONE  (OXY IR/ROXICODONE ) 5  MG immediate release tablet Take 5 mg by mouth 3 (three) times daily as needed for moderate pain or breakthrough pain.     [provider]  pantoprazole  (PROTONIX ) 40 MG tablet TAKE 1 TABLET BY MOUTH EVERY DAY 05/30/19   Corcoran, Melissa C, MD  rosuvastatin  (CRESTOR ) 20 MG tablet Take 20 mg by mouth daily.    [provider]  sertraline  (ZOLOFT ) 100 MG tablet Take 100 mg by mouth daily. 05/22/19   [provider]  TRELEGY ELLIPTA 100-62.5-25 MCG/INH AEPB Inhale 1 puff into the lungs daily.  02/20/19   [provider]  zinc  sulfate, 50mg  elemental zinc , 220 (50 Zn) MG capsule Take 1 capsule (220 mg total) by mouth daily. 10/28/23   Dino Antu, MD    Allergies:  Allergies as of 02/15/2024 - Review Complete 02/15/2024  Allergen Reaction Noted   Aspirin  02/14/2019    ROS:  Pertinent positives and negatives noted in HPI   Objective:     BP 100/62 (BP Location: Left Arm)   Pulse 63   Temp 98.4 F (36.9 C)   Resp 18   Ht 5' 4 (1.626 m)   Wt 50 kg   SpO2 91%   BMI 18.92 kg/m    Constitutional :  alert, cooperative, appears stated age, and no distress  Respiratory:  Clear to auscultation bilaterally  Cardiovascular:  Regular rate and rhythm  Gastrointestinal: Soft, no guarding, focal TTP RUQ.   Skin: Cool and moist  Psychiatric: Normal affect, non-agitated, not confused       LABS:     Latest Ref Rng & Units 02/16/2024    5:48 AM 02/15/2024    2:42 PM 10/24/2023    2:30 PM  CMP  Glucose 70 - 99 mg/dL 83  87  869   BUN 8 - 23 mg/dL 9  14  17    Creatinine 0.44 - 1.00 mg/dL 9.56  9.46  9.20   Sodium 135 - 145 mmol/L 137  135  139   Potassium 3.5 - 5.1 mmol/L 4.3  4.0  4.1   Chloride 98 - 111 mmol/L 103  100  102   CO2 22 - 32 mmol/L 28  26  28    Calcium  8.9 - 10.3 mg/dL 8.7  9.9  9.1   Total Protein 6.5 - 8.1 g/dL 5.7  7.1    Total Bilirubin 0.0 - 1.2 mg/dL 0.2  0.3    Alkaline Phos 38 - 126 U/L 61  67    AST 15 - 41 U/L 23  27    ALT 0 - 44 U/L 25  27        Latest Ref Rng & Units 02/16/2024    5:48 AM 02/15/2024    2:42 PM 10/25/2023    5:43 AM  CBC  WBC 4.0 - 10.5 K/uL 4.4  9.0  7.1   Hemoglobin 12.0 - 15.0 g/dL 88.9  87.1  88.1   Hematocrit 36.0 - 46.0 % 34.6  40.5  37.2   Platelets 150 - 400 K/uL 163  202  171      RADS: EXAM: Right Upper  Quadrant Abdominal Ultrasound 02/15/2024 07:27:04 PM   TECHNIQUE: Real-time ultrasonography of the right upper quadrant of the abdomen was performed.   COMPARISON: None available.   CLINICAL HISTORY: Biliary colic.   FINDINGS:   LIVER: Normal echogenicity. No intrahepatic biliary ductal dilatation. No evidence of mass. Hepatopetal flow in the portal vein.   BILIARY SYSTEM: The  gallbladder wall is thickened and hyperemic. The gallbladder is distended and sludge filled with a 19 mm gallstone seen impacted within the gallbladder neck (image 14). There is trace pericholecystic edema. All together, the findings are in keeping with changes of acute cholecystitis. The common bile duct appears mildly dilated measuring up to 8 mm in diameter within the pancreatic head. The periampullary duct is obscured by overlying bowel gas. No intrahepatic biliary ductal dilatation.   RIGHT KIDNEY: Limited images of the right kidney are unremarkable.   PANCREAS: The pancreas is obscured by overlying bowel gas.   OTHER: No right upper quadrant ascites.   IMPRESSION: 1. Acute cholecystitis with a 19 mm gallstone impacted in the gallbladder neck. 2. Mild common bile duct dilatation to 8 mm without intrahepatic biliary ductal dilatation; the periampullary duct is obscured by overlying bowel gas. If there is clinical concern for choledocholithiasis, MRCP examination is recommended for further evaluation.   Electronically signed by: Dorethia Molt MD 02/15/2024 07:37 PM EST RP Workstation: [id number]  EXAM: CT ABDOMEN AND PELVIS WITH CONTRAST 02/15/2024 06:33:47 PM   TECHNIQUE: CT of the abdomen and pelvis was performed with the administration of 100 mL iohexol  (OMNIPAQUE ) 300 MG/ML solution. Multiplanar reformatted images are provided for review. Automated exposure control, iterative reconstruction, and/or weight-based adjustment of the mA/kV was utilized to reduce the radiation dose to as  low as reasonably achievable.   COMPARISON: CT dated 01/11/2019 and CT dated 02/25/2021.   CLINICAL HISTORY: Generalized abdominal pain, right sided greater.   FINDINGS:   LOWER CHEST: Scarring, bronchiolectasis, and emphysema in the lower lungs. Subpleural nodule in the right lower lobe measuring 12 mm on series 4 image 25.   LIVER: Hepatic steatosis.   GALLBLADDER AND BILE DUCTS: Marked gallbladder distention with diffuse gallbladder wall thickening with stone in the gallbladder neck measuring 21 mm. The common bile duct is dilated up to 11 mm without radiopaque stone in the common bile duct.   SPLEEN: No acute abnormality.   PANCREAS: No acute abnormality.   ADRENAL GLANDS: No acute abnormality.   KIDNEYS, URETERS AND BLADDER: No stones in the kidneys or ureters. No hydronephrosis. No perinephric or periureteral stranding. Urinary bladder is unremarkable.   GI AND BOWEL: Moderate hiatal hernia. Stomach demonstrates no acute abnormality. There is no bowel obstruction. Appendectomy.   PERITONEUM AND RETROPERITONEUM: No ascites. No free air.   VASCULATURE: Aorta is normal in caliber. Aortic atherosclerotic calcification.   LYMPH NODES: No lymphadenopathy.   REPRODUCTIVE ORGANS: Hysterectomy.   BONES AND SOFT TISSUES: Severe compression fracture of L1 with retropulsion of the posterior L1 vertebral body causing severe spinal canal narrowing. This is unchanged from CT 02/25/2021. Chronic severe endplate compression of L3. ORIF right femur fracture. No focal soft tissue abnormality.   IMPRESSION: 1. Acute cholecystitis secondary to an 11 mm gallstone in the gallbladder neck. 2. 12 mm subpleural right lower lobe pulmonary nodule; recommend prompt non-contrast chest CT for full evaluation per Fleischner Society Guidelines.   Electronically signed by: Norman Gatlin MD 02/15/2024 06:44 PM EST RP Workstation: [id number]     Assessment:      Acute  cholecysitis Lung nodule- high risk screening, missed past few years.   COPD   Plan:      Discussed the risk of surgery including post-op infxn, seroma, biloma, chronic pain, poor-delayed wound healing, retained gallstone, conversion to open procedure, post-op SBO or ileus, and need for additional procedures to address said risks.  The risks of general anesthetic including  MI, CVA, sudden death or even reaction to anesthetic medications also discussed. Alternatives include continued observation.  Benefits include possible symptom relief, prevention of complications including acute cholecystitis, pancreatitis.  Typical post operative recovery of 3-5 days rest, continued pain in area and incision sites, possible loose stools up to 4-6 weeks, also discussed.  The patient understands the risks, any and all questions were answered to the patient's satisfaction.  To OR for robo lap chole.  IVF, rocephin  in the meantime.  F/u chest CT for lung nodule.  Ordered  COPD- continue home meds Tobacco dependence- nicotine  patch.  labs/images/medications/previous chart entries reviewed personally and relevant changes/updates noted above.

## 2024-02-16 NOTE — Op Note (Signed)
 Preoperative diagnosis:  acute and cholecystitis  Postoperative diagnosis: same as above  Procedure: Robotic assisted Laparoscopic Cholecystectomy.   Anesthesia: GETA   Surgeon: Henriette Pierre  Specimen: Gallbladder  Complications: None  EBL: 15mL  Wound Classification: Clean Contaminated  Indications: see HPI  Findings: Critical view of safety noted Cystic duct and artery identified, ligated and divided, clips remained intact at end of procedure Adequate hemostasis  Description of procedure:  The patient was placed on the operating table in the supine position. SCDs placed, pre-op abx administered.  General anesthesia was induced and OG tube placed by anesthesia. A time-out was completed verifying correct patient, procedure, site, positioning, and implant(s) and/or special equipment prior to beginning this procedure. The abdomen was prepped and draped in the usual sterile fashion.    Veress needle was placed at the Palmer's point and insufflation was started after confirming a positive saline drop test and no immediate increase in abdominal pressure.  After reaching 15 mm, the Veress needle was removed and a 5 mm port was placed via optiview technique in same area.   The abdomen was inspected and no abnormalities or injuries were found.  Under direct vision, ports were placed in the following locations: 8mm port under umbilicus measured 20mm from gallbladder. One 12 mm patient left of the umbilicus, 8cm from the periumbilical port, one 8 mm port placed to the patient right of the umbilical port 8 cm apart.  1 additional 8 mm port placed lateral to the 12mm port.  12mm port noted to be leaking air due to extremely lax skin, so fascial suture placed to minimize leak out and also into subQ space.  Once ports were placed, lap ligasure used to take down omental adhesions to abdominal wall. The table was placed in the reverse Trendelenburg position with the right side up. The Xi platform was  brought into the operative field and docked to the ports successfully.  An endoscope was placed through the umbilical port, fenestrated grasper through the adjacent patient right port, prograsp to the far patient left port, and then a hook cautery in the left port.  Extremely inflamed gallbladder noted.  Omental adhesions were gently peeled off after decompressing the gallbladder using robotic suction and creating a hole with a hook cautery.  The dome of the gallbladder was grasped with prograsp, passed and retracted over the dome of the liver.  The infundibulum was grasped with the fenestrated grasper and retracted toward the right lower quadrant. This maneuver exposed Calots triangle area.  The overlying peritoneum was extremely thick and dissection continued meticulously until the common bile duct was eventually located using ICG.  Additional dissection above this area was attempted but difficult to obtain.  Therefore, gallbladder was incised and the cystic duct was localized through looking inside gallbladder lumen.  Additional dissection was then able to be carried out by triangulating this area while making sure to not injure the common bile duct until eventually an area of tapering was noted indicating the cystic duct.  Vessel sealer was used to facilitate dissection and minimize bleeding of this area.  Large clips then used to clip the cystic duct.     The gallbladder and surrounding peritoneal lining was then dissected removed from the liver bed is much as possible.  A good portion of the posterior wall unfortunately had to be left behind due to inability to find a plane before running into bleeding from the liver bed.  Posterior portion and the surrounding mucosa was extensively  cautery.  15 French round drain placed in the area after extensive irrigation and confirmation of hemostasis.  Drain secured to the skin using 3-0 nylon.  Hemostasis was checked prior to removing the hook cautery and the Endo  Catch bag was then placed through the 12 mm port and the gallbladder was removed.  The gallbladder was passed off the table as a specimen. There was no evidence of bleeding from the gallbladder fossa or cystic artery or leakage of the bile from the cystic duct stump. The 12 mm port site closed under direct vision using 0 vicryl under direct vision.  Abdomen desufflated and secondary trocars were removed under direct vision. No bleeding was noted. All skin incisions then closed with subcuticular sutures of 4-0 monocryl and dressed with topical skin adhesive. The orogastric tube was removed and patient extubated.  Extensive subcu crepitus was noted secondary to the CO2 leakage.  The patient tolerated the procedure well and was taken to the postanesthesia care unit in stable condition.  All sponge and instrument count correct at end of procedure.

## 2024-02-16 NOTE — Anesthesia Preprocedure Evaluation (Addendum)
 "                                  Anesthesia Evaluation  Patient identified by MRN, date of birth, ID band Patient awake    Reviewed: Allergy & Precautions, NPO status , Patient's Chart, lab work & pertinent test results  Airway Mallampati: II  TM Distance: >3 FB Neck ROM: full    Dental  (+) Edentulous Lower, Partial Lower   Pulmonary sleep apnea , COPD,  COPD inhaler, former smoker   Pulmonary exam normal        Cardiovascular Exercise Tolerance: Good hypertension, Pt. on medications Normal cardiovascular exam Rhythm:Regular Rate:Normal  Aortic Aneurysm   Neuro/Psych negative neurological ROS  negative psych ROS   GI/Hepatic negative GI ROS, Neg liver ROS,GERD  ,,  Endo/Other  negative endocrine ROS    Renal/GU negative Renal ROS     Musculoskeletal  (+) Arthritis ,    Abdominal   Peds  Hematology negative hematology ROS (+)   Anesthesia Other Findings Past Medical History: No date: AAA (abdominal aortic aneurysm) No date: Arthritis     Comment:  knees, elbows No date: Asthma No date: Colon cancer (HCC)     Comment:  colon No date: COPD (chronic obstructive pulmonary disease) (HCC) No date: GERD (gastroesophageal reflux disease) No date: Hypertension No date: Sleep apnea  Past Surgical History: No date: ABDOMINAL HYSTERECTOMY No date: APPENDECTOMY 05/28/2019: CATARACT EXTRACTION W/PHACO; Left     Comment:  Procedure: CATARACT EXTRACTION PHACO AND INTRAOCULAR               LENS PLACEMENT (IOC) LEFT VISION BLUE 31.19 02:18.2;                Surgeon: Jaye Fallow, MD;  Location: [facility]              [facility];  Service: Ophthalmology;  Laterality: Left; 06/18/2019: CATARACT EXTRACTION W/PHACO; Right     Comment:  Procedure: CATARACT EXTRACTION PHACO AND INTRAOCULAR               LENS PLACEMENT (IOC) RIGHT 34.59  02:31.7;  Surgeon:               Jaye Fallow, MD;  Location: [facility];                Service:  Ophthalmology;  Laterality: Right;  sleep apnea 01/12/2019: ESOPHAGOGASTRODUODENOSCOPY; N/A     Comment:  Procedure: ESOPHAGOGASTRODUODENOSCOPY (EGD);  Surgeon:               Janalyn Keene NOVAK, MD;  Location: [facility] ENDOSCOPY;                Service: Endoscopy;  Laterality: N/A; 01/14/2019: ESOPHAGOGASTRODUODENOSCOPY (EGD) WITH PROPOFOL ; N/A     Comment:  Procedure: ESOPHAGOGASTRODUODENOSCOPY (EGD) WITH               PROPOFOL ;  Surgeon: Saintclair Jasper, MD;  Location: [facility]               ENDOSCOPY;  Service: Gastroenterology;  Laterality: N/A; 01/14/2019: HEMOSTASIS CLIP PLACEMENT     Comment:  Procedure: HEMOSTASIS CLIP PLACEMENT;  Surgeon: Saintclair Jasper, MD;  Location: [facility] ENDOSCOPY;  Service:               Gastroenterology;; 10/25/2023: INTRAMEDULLARY (IM) NAIL INTERTROCHANTERIC; Left  Comment:  Procedure: FIXATION, FRACTURE, INTERTROCHANTERIC, WITH               INTRAMEDULLARY ROD;  Surgeon: Ezra Jackquline RAMAN, MD;                Location: [facility] ORS;  Service: Orthopedics;  Laterality:               Left; No date: KNEE SURGERY; Right     Comment:  fracture repair No date: TONSILLECTOMY No date: TUBAL LIGATION No date: TUMOR EXCISION     Comment:  colon  BMI    Body Mass Index: 18.92 kg/m      Reproductive/Obstetrics negative OB ROS                              Anesthesia Physical Anesthesia Plan  ASA: 3  Anesthesia Plan: General   Post-op Pain Management:    Induction: Intravenous  PONV Risk Score and Plan: Ondansetron , Dexamethasone , Midazolam  and Treatment may vary due to age or medical condition  Airway Management Planned: Oral ETT  Additional Equipment:   Intra-op Plan:   Post-operative Plan: Extubation in OR  Informed Consent: I have reviewed the patients History and Physical, chart, labs and discussed the procedure including the risks, benefits and alternatives for the proposed anesthesia with the patient or authorized  representative who has indicated his/her understanding and acceptance.     Dental Advisory Given  Plan Discussed with: CRNA  Anesthesia Plan Comments:          Anesthesia Quick Evaluation  "

## 2024-02-16 NOTE — Discharge Instructions (Signed)
 Laparoscopic Cholecystectomy, Care After This sheet gives you information about how to care for yourself after your procedure. Your doctor may also give you more specific instructions. If you have problems or questions, contact your doctor. Follow these instructions at home: Care for cuts from surgery (incisions)  Follow instructions from your doctor about how to take care of your cuts from surgery. Make sure you: Wash your hands with soap and water before you change your bandage (dressing). If you cannot use soap and water, use hand sanitizer. Change your bandage as told by your doctor. Leave stitches (sutures), skin glue, or skin tape (adhesive) strips in place. They may need to stay in place for 2 weeks or longer. If tape strips get loose and curl up, you may trim the loose edges. Do not remove tape strips completely unless your doctor says it is okay. Do not take baths, swim, or use a hot tub until your doctor says it is okay. OK TO SHOWER 24HRS AFTER YOUR SURGERY.  Check your surgical cut area every day for signs of infection. Check for: More redness, swelling, or pain. More fluid or blood. Warmth. Pus or a bad smell. Activity Do not drive or use heavy machinery while taking prescription pain medicine. Do not play contact sports until your doctor says it is okay. Do not drive for 24 hours if you were given a medicine to help you relax (sedative). Rest as needed. Do not return to work or school until your doctor says it is okay. General instructions  tylenol and advil as needed for discomfort.  Please alternate between the two every four hours as needed for pain.    Use narcotics, if prescribed, only when tylenol and motrin is not enough to control pain.  325-650mg  every 8hrs to max of 3000mg /24hrs (including the 325mg  in every norco dose) for the tylenol.    Advil up to 800mg  per dose every 8hrs as needed for pain.   To prevent or treat constipation while you are taking prescription  pain medicine, your doctor may recommend that you: Drink enough fluid to keep your pee (urine) clear or pale yellow. Take over-the-counter or prescription medicines. Eat foods that are high in fiber, such as fresh fruits and vegetables, whole grains, and beans. Limit foods that are high in fat and processed sugars, such as fried and sweet foods. Contact a doctor if: You develop a rash. You have more redness, swelling, or pain around your surgical cuts. You have more fluid or blood coming from your surgical cuts. Your surgical cuts feel warm to the touch. You have pus or a bad smell coming from your surgical cuts. You have a fever. One or more of your surgical cuts breaks open. You have trouble breathing. You have chest pain. You have pain that is getting worse in your shoulders. You faint or feel dizzy when you stand. You have very bad pain in your belly (abdomen). You are sick to your stomach (nauseous) for more than one day. You have throwing up (vomiting) that lasts for more than one day. You have leg pain. This information is not intended to replace advice given to you by your health care provider. Make sure you discuss any questions you have with your health care provider. Document Released: 10/27/2007 Document Revised: 08/08/2015 Document Reviewed: 07/06/2015 Elsevier Interactive Patient Education  2019 [employer].

## 2024-02-17 ENCOUNTER — Encounter: Payer: Self-pay | Admitting: Surgery

## 2024-02-17 LAB — BASIC METABOLIC PANEL WITH GFR
Anion gap: 7 (ref 5–15)
BUN: 9 mg/dL (ref 8–23)
CO2: 27 mmol/L (ref 22–32)
Calcium: 9.1 mg/dL (ref 8.9–10.3)
Chloride: 101 mmol/L (ref 98–111)
Creatinine, Ser: 0.53 mg/dL (ref 0.44–1.00)
GFR, Estimated: >60 mL/min
Glucose, Bld: 115 mg/dL — ABNORMAL HIGH (ref 70–99)
Potassium: 4.2 mmol/L (ref 3.5–5.1)
Sodium: 136 mmol/L (ref 135–145)

## 2024-02-17 LAB — CBC
HCT: 36.6 % (ref 36.0–46.0)
Hemoglobin: 11.2 g/dL — ABNORMAL LOW (ref 12.0–15.0)
MCH: 27.9 pg (ref 26.0–34.0)
MCHC: 30.6 g/dL (ref 30.0–36.0)
MCV: 91 fL (ref 80.0–100.0)
Platelets: 200 K/uL (ref 150–400)
RBC: 4.02 MIL/uL (ref 3.87–5.11)
RDW: 15.2 % (ref 11.5–15.5)
WBC: 8.5 K/uL (ref 4.0–10.5)
nRBC: 0 % (ref 0.0–0.2)

## 2024-02-17 NOTE — Plan of Care (Signed)
" °  Problem: Education: Goal: Knowledge of General Education information will improve Description: Including pain rating scale, medication(s)/side effects and non-pharmacologic comfort measures Outcome: Progressing   Problem: Health Behavior/Discharge Planning: Goal: Ability to manage health-related needs will improve Outcome: Completed/Met   Problem: Clinical Measurements: Goal: Ability to maintain clinical measurements within normal limits will improve Outcome: Progressing   Problem: Clinical Measurements: Goal: Will remain free from infection Outcome: Progressing   "

## 2024-02-17 NOTE — Care Management CC44 (Signed)
"         Condition Code 44 Documentation Completed  Patient Details  Name: Alicia Holder MRN: [id number] Date of Birth: 26-Sep-1950   Condition Code 44 given:  Yes Patient signature on Condition Code 44 notice:  Yes Documentation of 2 MD's agreement:  Yes Code 44 added to claim:  Yes    Delphine KANDICE Bring, RN 02/17/2024, 1:36 PM  "

## 2024-02-17 NOTE — Progress Notes (Signed)
 Mobility Specialist Progress Note:    02/17/24 1208  Mobility  Activity Stood with assistance;Pivoted/transferred to/from [facility]  Level of Assistance Contact guard assist, steadying assist  Assistive Device BSC;None  Distance Ambulated (ft) 3 ft  Range of Motion/Exercises Active;All extremities  Activity Response Tolerated well  Mobility visit 1 Mobility  Mobility Specialist Start Time (ACUTE ONLY) 1155  Mobility Specialist Stop Time (ACUTE ONLY) 1203  Mobility Specialist Time Calculation (min) (ACUTE ONLY) 8 min   Pt received requesting assistance. Required CGA to stand and transfer with no AD. Tolerated well, asx throughout. Returned fowlers, alarm on and belongings in reach. All needs met.  Sherrilee Ditty Mobility Specialist Please contact via Special Educational Needs Teacher or  Rehab office at [phone]

## 2024-02-17 NOTE — Plan of Care (Signed)
" °  Problem: Education: Goal: Knowledge of General Education information will improve Description: Including pain rating scale, medication(s)/side effects and non-pharmacologic comfort measures Outcome: Progressing   Problem: Clinical Measurements: Goal: Ability to maintain clinical measurements within normal limits will improve Outcome: Progressing Goal: Will remain free from infection Outcome: Progressing Goal: Diagnostic test results will improve Outcome: Progressing Goal: Respiratory complications will improve Outcome: Progressing Goal: Cardiovascular complication will be avoided Outcome: Progressing   Problem: Activity: Goal: Risk for activity intolerance will decrease Outcome: Progressing   Problem: Nutrition: Goal: Adequate nutrition will be maintained Outcome: Progressing   Problem: Coping: Goal: Level of anxiety will decrease Outcome: Progressing   Problem: Elimination: Goal: Will not experience complications related to bowel motility Outcome: Progressing Goal: Will not experience complications related to urinary retention Outcome: Progressing   Problem: Pain Managment: Goal: General experience of comfort will improve and/or be controlled Outcome: Progressing   Problem: Safety: Goal: Ability to remain free from injury will improve Outcome: Progressing   Problem: Skin Integrity: Goal: Risk for impaired skin integrity will decrease Outcome: Progressing   Problem: Education: Goal: Knowledge of the prescribed therapeutic regimen will improve Outcome: Progressing   Problem: Bowel/Gastric: Goal: Gastrointestinal status for postoperative course will improve Outcome: Progressing   Problem: Cardiac: Goal: Ability to maintain an adequate cardiac output Outcome: Progressing Goal: Will show no evidence of cardiac arrhythmias Outcome: Progressing   Problem: Nutritional: Goal: Will attain and maintain optimal nutritional status Outcome: Progressing   Problem:  Neurological: Goal: Will regain or maintain usual level of consciousness Outcome: Progressing   Problem: Clinical Measurements: Goal: Ability to maintain clinical measurements within normal limits Outcome: Progressing Goal: Postoperative complications will be avoided or minimized Outcome: Progressing   Problem: Respiratory: Goal: Will regain and/or maintain adequate ventilation Outcome: Progressing Goal: Respiratory status will improve Outcome: Progressing   Problem: Skin Integrity: Goal: Demonstrates signs of wound healing without infection Outcome: Progressing   Problem: Urinary Elimination: Goal: Will remain free from infection Outcome: Progressing Goal: Ability to achieve and maintain adequate urine output Outcome: Progressing   "

## 2024-02-17 NOTE — Care Management Obs Status (Signed)
 MEDICARE OBSERVATION STATUS NOTIFICATION   Patient Details  Name: Alicia Holder MRN: [id number] Date of Birth: Jan 07, 1951   Medicare Observation Status Notification Given:  Yes    Delphine KANDICE Bring, RN 02/17/2024, 10:30 AM

## 2024-02-18 DIAGNOSIS — I10 Essential (primary) hypertension: Secondary | ICD-10-CM | POA: Diagnosis present

## 2024-02-18 DIAGNOSIS — J4489 Other specified chronic obstructive pulmonary disease: Secondary | ICD-10-CM | POA: Diagnosis present

## 2024-02-18 DIAGNOSIS — K838 Other specified diseases of biliary tract: Secondary | ICD-10-CM | POA: Diagnosis not present

## 2024-02-18 DIAGNOSIS — M159 Polyosteoarthritis, unspecified: Secondary | ICD-10-CM | POA: Diagnosis present

## 2024-02-18 DIAGNOSIS — Z886 Allergy status to analgesic agent status: Secondary | ICD-10-CM | POA: Diagnosis not present

## 2024-02-18 DIAGNOSIS — Z7983 Long term (current) use of bisphosphonates: Secondary | ICD-10-CM | POA: Diagnosis not present

## 2024-02-18 DIAGNOSIS — Z79899 Other long term (current) drug therapy: Secondary | ICD-10-CM | POA: Diagnosis not present

## 2024-02-18 DIAGNOSIS — Z90710 Acquired absence of both cervix and uterus: Secondary | ICD-10-CM | POA: Diagnosis not present

## 2024-02-18 DIAGNOSIS — M4856XA Collapsed vertebra, not elsewhere classified, lumbar region, initial encounter for fracture: Secondary | ICD-10-CM | POA: Diagnosis present

## 2024-02-18 DIAGNOSIS — E43 Unspecified severe protein-calorie malnutrition: Secondary | ICD-10-CM | POA: Diagnosis present

## 2024-02-18 DIAGNOSIS — K8000 Calculus of gallbladder with acute cholecystitis without obstruction: Secondary | ICD-10-CM | POA: Diagnosis present

## 2024-02-18 DIAGNOSIS — R911 Solitary pulmonary nodule: Secondary | ICD-10-CM | POA: Diagnosis present

## 2024-02-18 DIAGNOSIS — K219 Gastro-esophageal reflux disease without esophagitis: Secondary | ICD-10-CM | POA: Diagnosis present

## 2024-02-18 DIAGNOSIS — I714 Abdominal aortic aneurysm, without rupture, unspecified: Secondary | ICD-10-CM | POA: Diagnosis present

## 2024-02-18 DIAGNOSIS — K810 Acute cholecystitis: Secondary | ICD-10-CM | POA: Diagnosis present

## 2024-02-18 DIAGNOSIS — Z85038 Personal history of other malignant neoplasm of large intestine: Secondary | ICD-10-CM | POA: Diagnosis not present

## 2024-02-18 DIAGNOSIS — Z681 Body mass index (BMI) 19 or less, adult: Secondary | ICD-10-CM | POA: Diagnosis not present

## 2024-02-18 DIAGNOSIS — Z8249 Family history of ischemic heart disease and other diseases of the circulatory system: Secondary | ICD-10-CM | POA: Diagnosis not present

## 2024-02-18 DIAGNOSIS — Z4659 Encounter for fitting and adjustment of other gastrointestinal appliance and device: Secondary | ICD-10-CM | POA: Diagnosis not present

## 2024-02-18 DIAGNOSIS — G4730 Sleep apnea, unspecified: Secondary | ICD-10-CM | POA: Diagnosis present

## 2024-02-18 DIAGNOSIS — E785 Hyperlipidemia, unspecified: Secondary | ICD-10-CM | POA: Diagnosis present

## 2024-02-18 DIAGNOSIS — K660 Peritoneal adhesions (postprocedural) (postinfection): Secondary | ICD-10-CM | POA: Diagnosis present

## 2024-02-18 DIAGNOSIS — F17200 Nicotine dependence, unspecified, uncomplicated: Secondary | ICD-10-CM | POA: Diagnosis present

## 2024-02-18 DIAGNOSIS — Z8679 Personal history of other diseases of the circulatory system: Secondary | ICD-10-CM | POA: Diagnosis not present

## 2024-02-18 LAB — CBC
HCT: 36.7 % (ref 36.0–46.0)
Hemoglobin: 11.4 g/dL — ABNORMAL LOW (ref 12.0–15.0)
MCH: 28.1 pg (ref 26.0–34.0)
MCHC: 31.1 g/dL (ref 30.0–36.0)
MCV: 90.6 fL (ref 80.0–100.0)
Platelets: 199 K/uL (ref 150–400)
RBC: 4.05 MIL/uL (ref 3.87–5.11)
RDW: 15.4 % (ref 11.5–15.5)
WBC: 6.4 K/uL (ref 4.0–10.5)
nRBC: 0 % (ref 0.0–0.2)

## 2024-02-18 LAB — BASIC METABOLIC PANEL WITH GFR
Anion gap: 5 (ref 5–15)
BUN: <5 mg/dL — ABNORMAL LOW (ref 8–23)
CO2: 31 mmol/L (ref 22–32)
Calcium: 9.1 mg/dL (ref 8.9–10.3)
Chloride: 103 mmol/L (ref 98–111)
Creatinine, Ser: 0.42 mg/dL — ABNORMAL LOW (ref 0.44–1.00)
GFR, Estimated: >60 mL/min
Glucose, Bld: 85 mg/dL (ref 70–99)
Potassium: 4.5 mmol/L (ref 3.5–5.1)
Sodium: 138 mmol/L (ref 135–145)

## 2024-02-18 MED ORDER — PIPERACILLIN-TAZOBACTAM 3.375 G IVPB
3.3750 g | Freq: Three times a day (TID) | INTRAVENOUS | Status: DC
  Administered 2024-02-18 – 2024-02-20 (×6): 3.375 g via INTRAVENOUS
  Filled 2024-02-18 (×6): qty 50

## 2024-02-18 NOTE — Progress Notes (Signed)
 SPIRITUAL CARE AND COUNSELING CONSULT NOTE   VISIT SUMMARY Chaplain was visiting the patient in the next bed and greeted Alicia Holder and offered spiritual care.  Patient said she had no needs at this time.    SPIRITUAL ENCOUNTER                                                                                                                                                                      Type of Visit: Initial Care provided to:: Patient Conversation partners present during encounter: Nurse Reason for visit: Routine spiritual support OnCall Visit: Yes   SPIRITUAL FRAMEWORK      GOALS       INTERVENTIONS        INTERVENTION OUTCOMES      SPIRITUAL CARE PLAN        If immediate needs arise, please contact [facility] 24 hour on call [phone].   Hart Moats, Chaplain  02/18/2024 7:58 AM

## 2024-02-18 NOTE — Consult Note (Addendum)
 "  Ruel Kung , MD [address], [address], [place], [place], [address] Phone: [phone] Fax: [phone]  Consultation  Referring Provider:     Dr Jordis Primary Care Physician:  [facility] Primary Gastroenterologist:          Dr Jinny - [facility]  Reason for Consultation:     Bile leak   Date of Admission:  02/15/2024 Date of Consultation:  02/18/2024         HPI:   Alicia Holder is a 74 y.o. female had a robotic cholecystectomy on Friday with Dr. Tye found to have bile in the drain concerning for a bile leak.  Hemoglobin 11.4 BMP normal. C/o mild RUQ Pain no fevers, not on any blood thinners.   Past Medical History:  Diagnosis Date   AAA (abdominal aortic aneurysm)    Arthritis    knees, elbows   Asthma    Colon cancer (HCC)    colon   COPD (chronic obstructive pulmonary disease) (HCC)    GERD (gastroesophageal reflux disease)    Hypertension    Sleep apnea     Past Surgical History:  Procedure Laterality Date   ABDOMINAL HYSTERECTOMY     APPENDECTOMY     CATARACT EXTRACTION W/PHACO Left 05/28/2019   Procedure: CATARACT EXTRACTION PHACO AND INTRAOCULAR LENS PLACEMENT (IOC) LEFT VISION BLUE 31.19 02:18.2;  Surgeon: Jaye Fallow, MD;  Location: [facility];  Service: Ophthalmology;  Laterality: Left;   CATARACT EXTRACTION W/PHACO Right 06/18/2019   Procedure: CATARACT EXTRACTION PHACO AND INTRAOCULAR LENS PLACEMENT (IOC) RIGHT 34.59  02:31.7;  Surgeon: Jaye Fallow, MD;  Location: [facility];  Service: Ophthalmology;  Laterality: Right;  sleep apnea   ESOPHAGOGASTRODUODENOSCOPY N/A 01/12/2019   Procedure: ESOPHAGOGASTRODUODENOSCOPY (EGD);  Surgeon: Janalyn Keene NOVAK, MD;  Location: [facility] ENDOSCOPY;  Service: Endoscopy;  Laterality: N/A;   ESOPHAGOGASTRODUODENOSCOPY (EGD) WITH PROPOFOL  N/A 01/14/2019   Procedure: ESOPHAGOGASTRODUODENOSCOPY (EGD) WITH PROPOFOL ;  Surgeon: Saintclair Jasper, MD;  Location: [facility] ENDOSCOPY;  Service:  Gastroenterology;  Laterality: N/A;   HEMOSTASIS CLIP PLACEMENT  01/14/2019   Procedure: HEMOSTASIS CLIP PLACEMENT;  Surgeon: Saintclair Jasper, MD;  Location: [facility] ENDOSCOPY;  Service: Gastroenterology;;   INTRAMEDULLARY (IM) NAIL INTERTROCHANTERIC Left 10/25/2023   Procedure: FIXATION, FRACTURE, INTERTROCHANTERIC, WITH INTRAMEDULLARY ROD;  Surgeon: Ezra Jackquline GORMAN, MD;  Location: [facility];  Service: Orthopedics;  Laterality: Left;   KNEE SURGERY Right    fracture repair   LAPAROSCOPIC LYSIS OF ADHESIONS  02/16/2024   Procedure: LYSIS, ADHESIONS, LAPAROSCOPIC;  Surgeon: Tye Millet, DO;  Location: [facility] OR[facility];  Service: General;;   TONSILLECTOMY     TUBAL LIGATION     TUMOR EXCISION     colon    Prior to Admission medications  Medication Sig Start Date End Date Taking? Authorizing Provider  acetaminophen  (TYLENOL ) 500 MG tablet Take 1,000 mg by mouth every 6 (six) hours as needed for mild pain.   Yes [provider]  albuterol  (VENTOLIN  HFA) 108 (90 Base) MCG/ACT inhaler Inhale 1-2 puffs into the lungs every 4 (four) hours as needed for wheezing or shortness of breath.  07/09/18  Yes [provider]  alendronate  (FOSAMAX ) 70 MG tablet Take 70 mg by mouth once a week. 07/26/17  Yes [provider]  ascorbic acid  (VITAMIN C ) 500 MG tablet Take 1 tablet (500 mg total) by mouth 2 (two) times daily. 10/27/23  Yes Rashid, Farhan, MD  Cholecalciferol (VITAMIN D3) 1.25 MG (50000 UT) CAPS VITAMIN D3 1.25  MG (50000 UT) CAPS 09/28/23  Yes [provider]  docusate sodium  (COLACE) 100 MG capsule Take 100 mg by mouth daily.   Yes [provider]  famotidine  (PEPCID ) 20 MG tablet Take 20 mg by mouth 2 (two) times daily.   Yes [provider]  lisinopril  (ZESTRIL ) 20 MG tablet Take 20 mg by mouth daily. 01/31/24  Yes [provider]  methocarbamol  (ROBAXIN ) 500 MG tablet Take 1 tablet (500 mg total) by mouth every 6 (six) hours as needed for muscle spasms.  10/27/23  Yes Rashid, Farhan, MD  nicotine  (NICODERM CQ  - DOSED IN MG/24 HOURS) 14 mg/24hr patch Place 1 patch (14 mg total) onto the skin daily. 10/28/23  Yes Rashid, Farhan, MD  oxyCODONE  (OXY IR/ROXICODONE ) 5 MG immediate release tablet Take 5 mg by mouth 3 (three) times daily as needed for moderate pain or breakthrough pain.    Yes [provider]  rosuvastatin  (CRESTOR ) 20 MG tablet Take 20 mg by mouth daily.   Yes [provider]  sertraline  (ZOLOFT ) 100 MG tablet Take 100 mg by mouth daily. 05/22/19  Yes [provider]  TRELEGY ELLIPTA 100-62.5-25 MCG/INH AEPB Inhale 1 puff into the lungs daily.  02/20/19  Yes [provider]  zinc  sulfate, 50mg  elemental zinc , 220 (50 Zn) MG capsule Take 1 capsule (220 mg total) by mouth daily. 10/28/23  Yes Rashid, Farhan, MD  buPROPion  (WELLBUTRIN  XL) 150 MG 24 hr tablet Take 150 mg by mouth daily.  Patient not taking: Reported on 02/16/2024 02/15/19   [provider]  montelukast  (SINGULAIR ) 10 MG tablet Take 10 mg by mouth at bedtime. Patient not taking: Reported on 10/25/2023 11/14/16   [provider]    Family History  Problem Relation Age of Onset   CAD Mother    CAD Father      Social History[1]  Allergies as of 02/15/2024 - Review Complete 02/15/2024  Allergen Reaction Noted   Aspirin  02/14/2019    Review of Systems:    All systems reviewed and negative except where noted in HPI.   Physical Exam:  Vital signs in last 24 hours: Temp:  [97.9 F (36.6 C)-98.2 F (36.8 C)] 98.1 F (36.7 C) (01/18 0830) Pulse Rate:  [75-93] 83 (01/18 0830) Resp:  [17-20] 17 (01/18 0830) BP: (109-123)/(77-91) 109/77 (01/18 0830) SpO2:  [95 %-100 %] 95 % (01/18 0830) Last BM Date : 02/18/24 General:   Pleasant, cooperative in NAD Head:  Normocephalic and atraumatic. Eyes:   No icterus.   Conjunctiva pink. PERRLA. Ears:  Normal auditory acuity. Neck:  Supple; no masses or thyroidomegaly Lungs:  Respirations even and unlabored. Lungs clear to auscultation bilaterally.   No wheezes, crackles, or rhonchi.  Heart:  Regular rate and rhythm;  Without murmur, clicks, rubs or gallops Abdomen:  Soft, nondistended, nontender. Normal bowel sounds. No appreciable masses or hepatomegaly.  No rebound or guarding.  Neurologic:  Alert and oriented x3;  grossly normal neurologically. Skin:  Intact without significant lesions or rashes. Cervical Nodes:  No significant cervical adenopathy. Psych:  Alert and cooperative. Normal affect.  LAB RESULTS: Recent Labs    02/16/24 0548 02/17/24 0541 02/18/24 0424  WBC 4.4 8.5 6.4  HGB 11.0* 11.2* 11.4*  HCT 34.6* 36.6 36.7  PLT 163 200 199   BMET Recent Labs    02/16/24 0548 02/17/24 0541 02/18/24 0424  NA 137 136 138  K 4.3 4.2 4.5  CL 103 101 103  CO2 28 27  31  GLUCOSE 83 115* 85  BUN 9 9 <5*  CREATININE 0.43* 0.53 0.42*  CALCIUM  8.7* 9.1 9.1   LFT Recent Labs    02/16/24 0548  PROT 5.7*  ALBUMIN  3.1*  AST 23  ALT 25  ALKPHOS 61  BILITOT 0.2  BILIDIR 0.2  IBILI 0.1*   PT/INR No results for input(s): LABPROT, INR in the last 72 hours.  STUDIES: No results found.    Impression / Plan:   Alicia Holder is a 74 y.o. y/o female w who had a robotic cholecystectomy by Dr. Tye on Friday.  Consulted by Dr. Jordis for bile noted in the drain concerning for a bile leak and appropriateness for an ERCP.  Plan 1.  I will place the patient on an ERCP schedule for Dr.Wohl for tomorrow   NSAID's listed as allergy on her meds list but when I asked the patient she wasn't aware of any allergies.     I have discussed alternative options, risks & benefits,  which include, but are not limited to, bleeding, infection, perforation,respiratory complication & drug reaction, pancreatitis and occasionally death.  The patient agrees with this plan & written consent will be obtained.    Thank you for involving me in the care of this  patient.      LOS: 1 day   Ruel Kung, MD  02/18/2024, 2:00 PM         [1]  Social History Tobacco Use   Smoking status: Former    Current packs/day: 0.00    Average packs/day: 1 pack/day for 41.0 years (41.0 ttl pk-yrs)    Types: Cigarettes    Start date: 12/31/1977    Quit date: 01/01/2019    Years since quitting: 5.1   Smokeless tobacco: Never  Vaping Use   Vaping status: Never Used  Substance Use Topics   Alcohol use: Yes    Comment: rare- couple times per year   Drug use: Never   "

## 2024-02-18 NOTE — Progress Notes (Signed)
 CC: s/p robo chole Subjective: Some pain but not bad, tolerating liquids   Objective: Vital signs in last 24 hours: Temp:  [97.9 F (36.6 C)-98.2 F (36.8 C)] 98.1 F (36.7 C) (01/18 0830) Pulse Rate:  [75-93] 83 (01/18 0830) Resp:  [17-20] 17 (01/18 0830) BP: (109-123)/(77-91) 109/77 (01/18 0830) SpO2:  [95 %-100 %] 95 % (01/18 0830) Last BM Date : 02/18/24  Intake/Output from previous day: 01/17 0701 - 01/18 0700 In: 200 [IV Piggyback:200] Out: 3170 [Urine:3050; Drains:120] Intake/Output this shift: Total I/O In: -  Out: 120 [Drains:120]  Physical exam:  NAD alert Abd: soft, incisions c/d/I, JP obvious green bile, no peritonitis  Lab Results: CBC  Recent Labs    02/17/24 0541 02/18/24 0424  WBC 8.5 6.4  HGB 11.2* 11.4*  HCT 36.6 36.7  PLT 200 199   BMET Recent Labs    02/17/24 0541 02/18/24 0424  NA 136 138  K 4.2 4.5  CL 101 103  CO2 27 31  GLUCOSE 115* 85  BUN 9 <5*  CREATININE 0.53 0.42*  CALCIUM  9.1 9.1   PT/INR No results for input(s): LABPROT, INR in the last 72 hours. ABG No results for input(s): PHART, HCO3 in the last 72 hours.  Invalid input(s): PCO2, PO2  Studies/Results: No results found.  Anti-infectives: Anti-infectives (From admission, onward)    Start     Dose/Rate Route Frequency Ordered Stop   02/18/24 1445  piperacillin -tazobactam (ZOSYN ) IVPB 3.375 g        3.375 g 12.5 mL/hr over 240 Minutes Intravenous Every 8 hours 02/18/24 1359     02/15/24 2100  cefTRIAXone  (ROCEPHIN ) 2 g in sodium chloride  0.9 % 100 mL IVPB        2 g 200 mL/hr over 30 Minutes Intravenous Every 24 hours 02/15/24 2007 02/22/24 2059       Assessment/Plan: Bile leak following chole Asked GI to see for ERCP Start IV a/bs Obviously not ready for DC Pt and family update   Laneta Luna, MD, [facility]  02/18/2024

## 2024-02-18 NOTE — Plan of Care (Signed)
  Problem: Education: Goal: Knowledge of General Education information will improve Description: Including pain rating scale, medication(s)/side effects and non-pharmacologic comfort measures Outcome: Progressing   Problem: Clinical Measurements: Goal: Diagnostic test results will improve Outcome: Progressing   Problem: Activity: Goal: Risk for activity intolerance will decrease Outcome: Progressing   Problem: Coping: Goal: Level of anxiety will decrease Outcome: Progressing   Problem: Safety: Goal: Ability to remain free from injury will improve Outcome: Progressing

## 2024-02-18 NOTE — Progress Notes (Signed)
 Late entry of note POD # 1 Doing well Serous output from drain Paijn much better  PE NAD Abd: soft, incision c/d/I w/o infection or rebound, JP serous fluid  A/P Doing well CLD Not ready for DC

## 2024-02-19 ENCOUNTER — Encounter: Payer: Self-pay | Attending: Surgery

## 2024-02-19 ENCOUNTER — Inpatient Hospital Stay

## 2024-02-19 ENCOUNTER — Telehealth: Payer: Self-pay

## 2024-02-19 ENCOUNTER — Encounter: Payer: Self-pay | Admitting: Gastroenterology

## 2024-02-19 DIAGNOSIS — K839 Disease of biliary tract, unspecified: Secondary | ICD-10-CM

## 2024-02-19 DIAGNOSIS — K838 Other specified diseases of biliary tract: Secondary | ICD-10-CM

## 2024-02-19 DIAGNOSIS — Z4659 Encounter for fitting and adjustment of other gastrointestinal appliance and device: Secondary | ICD-10-CM

## 2024-02-19 HISTORY — PX: BILIARY STENT PLACEMENT: SHX5538

## 2024-02-19 HISTORY — PX: ERCP: SHX5425

## 2024-02-19 LAB — CBC
HCT: 35.9 % — ABNORMAL LOW (ref 36.0–46.0)
Hemoglobin: 11.1 g/dL — ABNORMAL LOW (ref 12.0–15.0)
MCH: 27.9 pg (ref 26.0–34.0)
MCHC: 30.9 g/dL (ref 30.0–36.0)
MCV: 90.2 fL (ref 80.0–100.0)
Platelets: 201 K/uL (ref 150–400)
RBC: 3.98 MIL/uL (ref 3.87–5.11)
RDW: 15.5 % (ref 11.5–15.5)
WBC: 5.4 K/uL (ref 4.0–10.5)
nRBC: 0 % (ref 0.0–0.2)

## 2024-02-19 LAB — BASIC METABOLIC PANEL WITH GFR
Anion gap: 4 — ABNORMAL LOW (ref 5–15)
BUN: 5 mg/dL — ABNORMAL LOW (ref 8–23)
CO2: 32 mmol/L (ref 22–32)
Calcium: 9.2 mg/dL (ref 8.9–10.3)
Chloride: 102 mmol/L (ref 98–111)
Creatinine, Ser: 0.5 mg/dL (ref 0.44–1.00)
GFR, Estimated: >60 mL/min
Glucose, Bld: 83 mg/dL (ref 70–99)
Potassium: 5.4 mmol/L — ABNORMAL HIGH (ref 3.5–5.1)
Sodium: 138 mmol/L (ref 135–145)

## 2024-02-19 MED ORDER — INDOMETHACIN 50 MG RE SUPP
100.0000 mg | Freq: Once | RECTAL | Status: DC
  Filled 2024-02-19: qty 2

## 2024-02-19 MED ORDER — PHENYLEPHRINE HCL (PRESSORS) 10 MG/ML IV SOLN
INTRAVENOUS | Status: DC | PRN
  Administered 2024-02-19 (×3): 80 ug via INTRAVENOUS

## 2024-02-19 MED ORDER — PROPOFOL 10 MG/ML IV BOLUS
INTRAVENOUS | Status: DC | PRN
  Administered 2024-02-19: 30 mg via INTRAVENOUS
  Administered 2024-02-19: 130 mg via INTRAVENOUS

## 2024-02-19 MED ORDER — LACTATED RINGERS IV SOLN: INTRAVENOUS | Status: DC

## 2024-02-19 MED ORDER — ONDANSETRON HCL 4 MG/2ML IJ SOLN
INTRAMUSCULAR | Status: DC | PRN
  Administered 2024-02-19: 4 mg via INTRAVENOUS

## 2024-02-19 MED ORDER — DEXAMETHASONE SOD PHOSPHATE PF 10 MG/ML IJ SOLN
INTRAMUSCULAR | Status: DC | PRN
  Administered 2024-02-19: 5 mg via INTRAVENOUS

## 2024-02-19 MED ORDER — LIDOCAINE HCL (CARDIAC) PF 100 MG/5ML IV SOSY
PREFILLED_SYRINGE | INTRAVENOUS | Status: DC | PRN
  Administered 2024-02-19 (×2): 50 mg via INTRAVENOUS

## 2024-02-19 MED ORDER — DICLOFENAC SUPPOSITORY 100 MG
RECTAL | Status: AC
  Filled 2024-02-19: qty 1

## 2024-02-19 MED ORDER — DICLOFENAC SUPPOSITORY 100 MG
100.0000 mg | Freq: Once | RECTAL | Status: AC
  Administered 2024-02-19: 100 mg via RECTAL

## 2024-02-19 MED ORDER — SUCCINYLCHOLINE CHLORIDE 200 MG/10ML IV SOSY
PREFILLED_SYRINGE | INTRAVENOUS | Status: DC | PRN
  Administered 2024-02-19: 100 mg via INTRAVENOUS

## 2024-02-19 NOTE — Progress Notes (Signed)
 Pt states she doesn't want to have bedalarm on. She states she can get up to the bedside commode. Informed of risks.

## 2024-02-19 NOTE — Progress Notes (Signed)
 [facility] General Surgery  SURGICAL PROGRESS NOTE  Hospital Day(s): 3.   Post op day(s): 3 Days Post-Op.   Interval History:  Dr. Jordis noted bile in JP drain and consulted GI for ERCP. Patient is scheduled for ERCP today. She appeared comfortable during encounter this morning. Reports generalized abdominal discomfort.  Denies any fevers, chills, nausea or vomiting.   Vital signs in last 24 hours: [min-max] current  Temp:  [97.7 F (36.5 C)-98.2 F (36.8 C)] 97.8 F (36.6 C) (01/19 0727) Pulse Rate:  [63-92] 65 (01/19 0727) Resp:  [16-18] 18 (01/19 0727) BP: (109-118)/(68-79) 118/74 (01/19 0727) SpO2:  [94 %-98 %] 97 % (01/19 0727)     Height: 5' 4 (162.6 cm) Weight: 50 kg BMI (Calculated): 18.91   Intake/Output last 2 shifts:  01/18 0701 - 01/19 0700 In: -  Out: 190 [Drains:120]   Physical Exam:  Constitutional: alert, cooperative and no distress  Respiratory: breathing non-labored at rest  Cardiovascular: regular rate and sinus rhythm  Gastrointestinal: soft, more localized tenderness on RUQ and epigastric area, and non-distended, surgical incisions are clean and dry-bile output in JP drain  Labs:     Latest Ref Rng & Units 02/19/2024    6:14 AM 02/18/2024    4:24 AM 02/17/2024    5:41 AM  CBC  WBC 4.0 - 10.5 K/uL 5.4  6.4  8.5   Hemoglobin 12.0 - 15.0 g/dL 88.8  88.5  88.7   Hematocrit 36.0 - 46.0 % 35.9  36.7  36.6   Platelets 150 - 400 K/uL 201  199  200       Latest Ref Rng & Units 02/19/2024    6:14 AM 02/18/2024    4:24 AM 02/17/2024    5:41 AM  CMP  Glucose 70 - 99 mg/dL 83  85  884   BUN 8 - 23 mg/dL 5  <5  9   Creatinine 9.55 - 1.00 mg/dL 9.49  9.57  9.46   Sodium 135 - 145 mmol/L 138  138  136   Potassium 3.5 - 5.1 mmol/L 5.4  4.5  4.2   Chloride 98 - 111 mmol/L 102  103  101   CO2 22 - 32 mmol/L 32  31  27   Calcium  8.9 - 10.3 mg/dL 9.2  9.1  9.1     Imaging studies: No new pertinent imaging studies   Assessment/Plan:  74 y.o. female with  acute cholecystitis 3 Days Post-Op s/p robotic assisted laparoscopic cholecystectomy, complicated by pertinent comorbidities including COPD, hypertension, hyperlipidemia, GERD, and history of colon cancer.   - Stable vital signs, no fever not tachycardic  - RUQ and epigastric tenderness with bile output in JP drain consistent with leak  - Scheduled for ERCP with Dr. Jinny today - Continue NPO for procedure   -- Else Habermann Barrientos PA-C

## 2024-02-19 NOTE — Telephone Encounter (Signed)
 ERCP w/ Stent removal Before 05/20/2024

## 2024-02-19 NOTE — Anesthesia Preprocedure Evaluation (Signed)
 "                                  Anesthesia Evaluation  Patient identified by MRN, date of birth, ID band Patient awake    Reviewed: Allergy & Precautions, H&P , NPO status , Patient's Chart, lab work & pertinent test results, reviewed documented beta blocker date and time   Airway Mallampati: II  TM Distance: >3 FB Neck ROM: full    Dental  (+) Poor Dentition, Chipped, Missing   Pulmonary neg shortness of breath, asthma , sleep apnea and Continuous Positive Airway Pressure Ventilation , COPD,  COPD inhaler, former smoker   Pulmonary exam normal        Cardiovascular Exercise Tolerance: Poor hypertension, On Medications negative cardio ROS Normal cardiovascular exam Rhythm:regular Rate:Normal     Neuro/Psych  Neuromuscular disease  negative psych ROS   GI/Hepatic Neg liver ROS,GERD  Medicated,,  Endo/Other  negative endocrine ROS    Renal/GU negative Renal ROS  negative genitourinary   Musculoskeletal   Abdominal   Peds  Hematology  (+) Blood dyscrasia, anemia   Anesthesia Other Findings Past Medical History: No date: AAA (abdominal aortic aneurysm) No date: Arthritis     Comment:  knees, elbows No date: Asthma No date: Colon cancer (HCC)     Comment:  colon No date: COPD (chronic obstructive pulmonary disease) (HCC) No date: GERD (gastroesophageal reflux disease) No date: Hypertension No date: Sleep apnea Past Surgical History: No date: ABDOMINAL HYSTERECTOMY No date: APPENDECTOMY 05/28/2019: CATARACT EXTRACTION W/PHACO; Left     Comment:  Procedure: CATARACT EXTRACTION PHACO AND INTRAOCULAR               LENS PLACEMENT (IOC) LEFT VISION BLUE 31.19 02:18.2;                Surgeon: Jaye Fallow, MD;  Location: [facility]              [facility];  Service: Ophthalmology;  Laterality: Left; 06/18/2019: CATARACT EXTRACTION W/PHACO; Right     Comment:  Procedure: CATARACT EXTRACTION PHACO AND INTRAOCULAR               LENS PLACEMENT (IOC)  RIGHT 34.59  02:31.7;  Surgeon:               Jaye Fallow, MD;  Location: [facility];                Service: Ophthalmology;  Laterality: Right;  sleep apnea 01/12/2019: ESOPHAGOGASTRODUODENOSCOPY; N/A     Comment:  Procedure: ESOPHAGOGASTRODUODENOSCOPY (EGD);  Surgeon:               Janalyn Keene NOVAK, MD;  Location: [facility] ENDOSCOPY;                Service: Endoscopy;  Laterality: N/A; 01/14/2019: ESOPHAGOGASTRODUODENOSCOPY (EGD) WITH PROPOFOL ; N/A     Comment:  Procedure: ESOPHAGOGASTRODUODENOSCOPY (EGD) WITH               PROPOFOL ;  Surgeon: Saintclair Jasper, MD;  Location: [facility]               ENDOSCOPY;  Service: Gastroenterology;  Laterality: N/A; 01/14/2019: HEMOSTASIS CLIP PLACEMENT     Comment:  Procedure: HEMOSTASIS CLIP PLACEMENT;  Surgeon: Saintclair Jasper, MD;  Location: [facility] ENDOSCOPY;  Service:  Gastroenterology;; 10/25/2023: INTRAMEDULLARY (IM) NAIL INTERTROCHANTERIC; Left     Comment:  Procedure: FIXATION, FRACTURE, INTERTROCHANTERIC, WITH               INTRAMEDULLARY ROD;  Surgeon: Ezra Jackquline RAMAN, MD;                Location: [facility] ORS;  Service: Orthopedics;  Laterality:               Left; No date: KNEE SURGERY; Right     Comment:  fracture repair 02/16/2024: LAPAROSCOPIC LYSIS OF ADHESIONS     Comment:  Procedure: LYSIS, ADHESIONS, LAPAROSCOPIC;  Surgeon:               Tye Millet, DO;  Location: [facility] ORS;  Service:               General;; No date: TONSILLECTOMY No date: TUBAL LIGATION No date: TUMOR EXCISION     Comment:  colon BMI    Body Mass Index: 18.71 kg/m     Reproductive/Obstetrics negative OB ROS                              Anesthesia Physical Anesthesia Plan  ASA: 3  Anesthesia Plan: General ETT   Post-op Pain Management:    Induction:   PONV Risk Score and Plan:   Airway Management Planned:   Additional Equipment:   Intra-op Plan:   Post-operative Plan:   Informed Consent:  I have reviewed the patients History and Physical, chart, labs and discussed the procedure including the risks, benefits and alternatives for the proposed anesthesia with the patient or authorized representative who has indicated his/her understanding and acceptance.     Dental Advisory Given  Plan Discussed with: CRNA  Anesthesia Plan Comments:         Anesthesia Quick Evaluation  "

## 2024-02-19 NOTE — Transfer of Care (Signed)
 Immediate Anesthesia Transfer of Care Note  Patient: Alicia Holder  Procedure(s) Performed: ERCP, WITH INTERVENTION IF INDICATED INSERTION, STENT, BILE DUCT  Patient Location: PACU  Anesthesia Type:General  Level of Consciousness: drowsy  Airway & Oxygen Therapy: Patient Spontanous Breathing and Patient connected to face mask oxygen  Post-op Assessment: Report given to RN and Post -op Vital signs reviewed and stable  Post vital signs: Reviewed and stable  Last Vitals:  Vitals Value Taken Time  BP 132/90 02/19/24 13:04  Temp 36.1 C 02/19/24 13:04  Pulse 86 02/19/24 13:05  Resp 13 02/19/24 13:05  SpO2 100 % 02/19/24 13:05  Vitals shown include unfiled device data.  Last Pain:  Vitals:   02/19/24 1304  TempSrc: Temporal  PainSc: 0-No pain      Patients Stated Pain Goal: 1 (02/19/24 0810)  Complications: No notable events documented.

## 2024-02-19 NOTE — Op Note (Signed)
 [facility] Gastroenterology Patient Name: Alicia Holder Procedure Date: 02/19/2024 11:51 AM MRN: [id number] Account #: [id number] Date of Birth: 03/29/1950 Admit Type: Inpatient Age: 74 Room: [facility] ENDO ROOM 4 Gender: Female Note Status: Finalized Instrument Name: CELINDA [id number] Procedure:             ERCP Indications:           Bile leak Providers:             Rogelia Copping MD, MD Referring MD:          No Local Md, MD (Referring MD) Medicines:             General Anesthesia Complications:         No immediate complications. Procedure:             Pre-Anesthesia Assessment:                        - Prior to the procedure, a History and Physical was                         performed, and patient medications and allergies were                         reviewed. The patient's tolerance of previous                         anesthesia was also reviewed. The risks and benefits                         of the procedure and the sedation options and risks                         were discussed with the patient. All questions were                         answered, and informed consent was obtained. Prior                         Anticoagulants: The patient has taken no anticoagulant                         or antiplatelet agents. ASA Grade Assessment: II - A                         patient with mild systemic disease. After reviewing                         the risks and benefits, the patient was deemed in                         satisfactory condition to undergo the procedure.                        After obtaining informed consent, the scope was passed                         under direct vision. Throughout the procedure, the  patient's blood pressure, pulse, and oxygen                         saturations were monitored continuously. The                         Duodenoscope was introduced through the mouth, and                         used to inject  contrast into and used to cannulate the                         bile duct. The ERCP was accomplished without                         difficulty. The patient tolerated the procedure well. Findings:      A scout film of the abdomen was obtained. One percutaneous drain ending       in the Right upper quadrant was seen. The esophagus was successfully       intubated under direct vision. The scope was advanced to a normal major       papilla in the descending duodenum without detailed examination of the       pharynx, larynx and associated structures, and upper GI tract. The upper       GI tract was grossly normal. The bile duct was deeply cannulated with       the short-nosed traction sphincterotome. Contrast was injected. I       personally interpreted the bile duct images. There was brisk flow of       contrast through the ducts. Image quality was excellent. Contrast       extended to the entire biliary tree. A wire was passed into the biliary       tree. A 7 mm biliary sphincterotomy was made with a traction (standard)       sphincterotome using ERBE electrocautery. There was no       post-sphincterotomy bleeding. One 10 Fr by 7 cm plastic stent with a       single external flap and a single internal flap was placed 5 cm into the       common bile duct. Bile flowed through the stent. The stent was in good       position. Impression:            - A biliary sphincterotomy was performed.                        - One plastic stent was placed into the common bile                         duct. Recommendation:        - Return patient to hospital ward for observation.                        - Clear liquid diet today.                        - Watch for pancreatitis, bleeding, perforation, and  cholangitis.                        - Repeat ERCP in 3 months to remove stent. Procedure Code(s):     --- Professional ---                        [phone], Endoscopic retrograde  cholangiopancreatography                         (ERCP); with placement of endoscopic stent into                         biliary or pancreatic duct, including pre- and                         post-dilation and guide wire passage, when performed,                         including sphincterotomy, when performed, each stent                        [id number], Endoscopic catheterization of the biliary                         ductal system, radiological supervision and                         interpretation Diagnosis Code(s):     --- Professional ---                        K83.8, Other specified diseases of biliary tract CPT copyright 2022 American Medical Association. All rights reserved. The codes documented in this report are preliminary and upon coder review may  be revised to meet current compliance requirements. Rogelia Copping MD, MD 02/19/2024 12:50:06 PM This report has been signed electronically. Number of Addenda: 0 Note Initiated On: 02/19/2024 11:51 AM Estimated Blood Loss:  Estimated blood loss: none.      [facility]

## 2024-02-19 NOTE — Anesthesia Procedure Notes (Signed)
 Procedure Name: Intubation Date/Time: 02/19/2024 12:13 PM  Performed by: Oneill Bais, CRNAPre-anesthesia Checklist: Patient identified, Emergency Drugs available, Suction available and Patient being monitored Patient Re-evaluated:Patient Re-evaluated prior to induction Oxygen Delivery Method: Circle System Utilized Preoxygenation: Pre-oxygenation with 100% oxygen Induction Type: IV induction Ventilation: Mask ventilation without difficulty Laryngoscope Size: McGrath and 3 Grade View: Grade I Tube type: Oral Tube size: 7.0 mm Number of attempts: 1 Airway Equipment and Method: Stylet and Oral airway Placement Confirmation: ETT inserted through vocal cords under direct vision, positive ETCO2 and breath sounds checked- equal and bilateral Secured at: 21 cm Tube secured with: Tape Dental Injury: Teeth and Oropharynx as per pre-operative assessment  Comments: Easy, atraumatic intubation. Lips, teeth and lips unchanged. Head and neck midline.

## 2024-02-20 ENCOUNTER — Encounter: Payer: Self-pay | Admitting: Hematology and Oncology

## 2024-02-20 ENCOUNTER — Other Ambulatory Visit: Payer: Self-pay

## 2024-02-20 ENCOUNTER — Telehealth: Payer: Self-pay | Admitting: Acute Care

## 2024-02-20 ENCOUNTER — Telehealth: Payer: Self-pay | Admitting: Pulmonary Disease

## 2024-02-20 LAB — BASIC METABOLIC PANEL WITH GFR
Anion gap: 5 (ref 5–15)
BUN: 6 mg/dL — ABNORMAL LOW (ref 8–23)
CO2: 32 mmol/L (ref 22–32)
Calcium: 9.2 mg/dL (ref 8.9–10.3)
Chloride: 102 mmol/L (ref 98–111)
Creatinine, Ser: 0.47 mg/dL (ref 0.44–1.00)
GFR, Estimated: >60 mL/min
Glucose, Bld: 74 mg/dL (ref 70–99)
Potassium: 5.1 mmol/L (ref 3.5–5.1)
Sodium: 139 mmol/L (ref 135–145)

## 2024-02-20 LAB — HEPATIC FUNCTION PANEL
ALT: 14 U/L (ref 0–44)
AST: 14 U/L — ABNORMAL LOW (ref 15–41)
Albumin: 3.1 g/dL — ABNORMAL LOW (ref 3.5–5.0)
Alkaline Phosphatase: 58 U/L (ref 38–126)
Bilirubin, Direct: 0.2 mg/dL (ref 0.0–0.2)
Indirect Bilirubin: 0.2 mg/dL — ABNORMAL LOW (ref 0.3–0.9)
Total Bilirubin: 0.4 mg/dL (ref 0.0–1.2)
Total Protein: 5.8 g/dL — ABNORMAL LOW (ref 6.5–8.1)

## 2024-02-20 LAB — CBC
HCT: 37.6 % (ref 36.0–46.0)
Hemoglobin: 11.7 g/dL — ABNORMAL LOW (ref 12.0–15.0)
MCH: 28 pg (ref 26.0–34.0)
MCHC: 31.1 g/dL (ref 30.0–36.0)
MCV: 90 fL (ref 80.0–100.0)
Platelets: 249 K/uL (ref 150–400)
RBC: 4.18 MIL/uL (ref 3.87–5.11)
RDW: 15 % (ref 11.5–15.5)
WBC: 6 K/uL (ref 4.0–10.5)
nRBC: 0 % (ref 0.0–0.2)

## 2024-02-20 LAB — SURGICAL PATHOLOGY

## 2024-02-20 MED ORDER — AMOXICILLIN-POT CLAVULANATE 875-125 MG PO TABS
1.0000 | ORAL_TABLET | Freq: Two times a day (BID) | ORAL | 0 refills | Status: DC
  Filled 2024-02-20: qty 6, 3d supply, fill #0

## 2024-02-20 MED ORDER — AMOXICILLIN-POT CLAVULANATE 875-125 MG PO TABS
1.0000 | ORAL_TABLET | Freq: Two times a day (BID) | ORAL | 0 refills | Status: AC
  Filled 2024-02-20: qty 10, 5d supply, fill #0

## 2024-02-20 NOTE — Discharge Summary (Signed)
 [facility]-General Surgery  SURGICAL DISCHARGE SUMMARY  Patient ID: Alicia Holder MRN: [id number] DOB/AGE: 75-Jan-1952 75 y.o.  Admit date: 02/15/2024 Discharge date: 02/20/2024  Discharge Diagnoses Patient Active Problem List   Diagnosis Date Noted   Bile leak 02/19/2024   Acute cholecystitis 02/15/2024   Protein-calorie malnutrition, severe 10/26/2023   Closed displaced intertrochanteric fracture of left femur, initial encounter (HCC) 10/25/2023   Hematoma muscle, left posterior flank muscle,  initial encounter 10/25/2023   Personal history of  carcinoid tumor s/p resection 2009 10/25/2023   History of bleeding peptic ulcer 2020/hemorrhagic shock 10/25/2023   Underweight (BMI < 18.5) 10/25/2023   Chronic respiratory failure with hypoxia (HCC) 10/25/2023   Closed hip fracture requiring operative repair with routine healing 10/24/2023   Carotid bruit 07/25/2021   Pulmonary nodules 03/03/2020   Left ovarian cyst 07/04/2019   Low TSH level 07/04/2019   Iron  deficiency anemia due to chronic blood loss 03/14/2019   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 03/14/2019   Weight loss 03/14/2019   Acute posthemorrhagic anemia 01/13/2019   COPD (chronic obstructive pulmonary disease) (HCC) 01/13/2019   H/O colon cancer, stage II 01/13/2019   Polycythemia vera (HCC) 01/13/2019   Severe anemia 01/12/2019   Hemorrhagic shock (HCC) 01/12/2019   Esophageal hemorrhage    GI bleed    Vitamin B12 deficiency    HTN (hypertension)    Hyperlipidemia    Tobacco abuse counseling    COPD with acute exacerbation (HCC) 01/01/2019   COPD exacerbation (HCC) 01/01/2019   Goals of care, counseling/discussion 05/10/2017   Aortic aneurysm, thoracic 03/30/2017   Thyroid  nodule 03/30/2017   Secondary erythrocytosis 01/04/2017   Hx of malignant carcinoid tumor of small intestine 07/09/2007    Consultants None   Procedures Robotic assisted Laparoscopic Cholecystectomy    Hospital  Course:  Patient presented to the [facility] ED on 02/15/2024 with lower abdominal pain.  In the ED, patient had a BP of 131/85, HR 82bpm, RR 18 and temp 97.5 F.  Labs were all within normal range.  No leukocytosis indicated on CBC.  LFTs, alkaline phos, and total bili were all within normal limits.  Lipase normal at 16. Per chart review, patient was presenting generalized abdominal pain with greatest pain on the right upper quadrant.  CT of abdomen showed acute cholecystitis secondary to 11 mm gallstone in the gallbladder neck.  Ultrasound of abdomen showed acute cholecystitis with 19 mm gallstone in the gallbladder wall neck with mild CBD dilatation to 8 mm.  Patient was taken to the operating room on 02/16/2024 for robotic assisted laparoscopic cholecystectomy. Surgery went well. Patient tolerated procedure.  On postop day 1-patient was doing well on clear liquid diet with serous output from drain. On postop day 2-Surgeon on-call noted green bile in JP drain concerning for bike leak. GI was consulted for possible ERCP. On postop day 3- ERCP was done and biliary stent was placed. A 7 mm sphincterotomy was made. On status post day 4 of robotic assisted laparoscopic cholecystectomy and status post day 1 of ERCP - No signs of ERCP pancreatitis. Patient denies any worsening abdominal pain. Vital signs remained stable. No leukocytosis indicated on CBC. AST 14 ALT 14 alkaline phos 58 and total bili 0.4. Patient is tolerating regular diet post-op and is able to ambulate. Surgical incisions show no signs of infection.  Serosanguineous output in drain, no bile output. Pain is overall well-controlled with pain medication. Patient is clear from surgical standpoint.  Patient will  follow-up outpatient with Dr. Tye in 2 weeks.  Continue antibiotic outpatient to complete a 7-day course.    Physical Examination:  Constitutional: alert, in no acute distress Pulmonary: CTA bilaterally, normal breath sounds Cardiac:  regular rate and rhythm Gastrointestinal: soft, mild tenderness, and non-distended Skin: abdominal incisions look clean, dry, and intact with surgical glue in place, serosanguineous output in drain   Allergies as of 02/20/2024       Reactions   Nsaids Other (See Comments)   Aspirin    Hx of esophageal bleed        Medication List     STOP taking these medications    enoxaparin  40 MG/0.4ML injection Commonly known as: LOVENOX    ipratropium-albuterol  0.5-2.5 (3) MG/3ML Soln Commonly known as: DUONEB   montelukast  10 MG tablet Commonly known as: SINGULAIR        TAKE these medications    acetaminophen  500 MG tablet Commonly known as: TYLENOL  Take 1,000 mg by mouth every 6 (six) hours as needed for mild pain.   albuterol  108 (90 Base) MCG/ACT inhaler Commonly known as: VENTOLIN  HFA Inhale 1-2 puffs into the lungs every 4 (four) hours as needed for wheezing or shortness of breath.   alendronate  70 MG tablet Commonly known as: FOSAMAX  Take 70 mg by mouth once a week.   amoxicillin -clavulanate 875-125 MG tablet Commonly known as: AUGMENTIN  Take 1 tablet by mouth 2 (two) times daily for 3 days.   ascorbic acid  500 MG tablet Commonly known as: VITAMIN C  Take 1 tablet (500 mg total) by mouth 2 (two) times daily.   buPROPion  150 MG 24 hr tablet Commonly known as: WELLBUTRIN  XL Take 150 mg by mouth daily.   docusate sodium  100 MG capsule Commonly known as: COLACE Take 100 mg by mouth daily.   famotidine  20 MG tablet Commonly known as: PEPCID  Take 20 mg by mouth 2 (two) times daily.   lisinopril  20 MG tablet Commonly known as: ZESTRIL  Take 20 mg by mouth daily.   methocarbamol  500 MG tablet Commonly known as: ROBAXIN  Take 1 tablet (500 mg total) by mouth every 6 (six) hours as needed for muscle spasms.   nicotine  14 mg/24hr patch Commonly known as: NICODERM CQ  - dosed in mg/24 hours Place 1 patch (14 mg total) onto the skin daily.   oxyCODONE  5 MG  immediate release tablet Commonly known as: Oxy IR/ROXICODONE  Take 5 mg by mouth 3 (three) times daily as needed for moderate pain or breakthrough pain.   rosuvastatin  20 MG tablet Commonly known as: CRESTOR  Take 20 mg by mouth daily.   sertraline  100 MG tablet Commonly known as: ZOLOFT  Take 100 mg by mouth daily.   Trelegy Ellipta 100-62.5-25 MCG/INH Aepb Generic drug: Fluticasone-Umeclidin-Vilant Inhale 1 puff into the lungs daily.   Vitamin D3 1.25 MG (50000 UT) Caps VITAMIN D3 1.25 MG (50000 UT) CAPS   zinc  sulfate (50mg  elemental zinc ) 220 (50 Zn) MG capsule Take 1 capsule (220 mg total) by mouth daily.          Follow-up Information     Tye, Isami, DO Follow up in 2 week(s).   Specialties: General Surgery, Surgery Why: post op lap chole Contact information: [address] [place] [address] [phone]         [facility] Follow up in 1 week(s).   Why: f/u CT lung cancer screening results Contact information: [address] [place] [address] [phone]  Time spent on discharge management including discussion of hospital course, clinical condition, outpatient instructions, prescriptions, and follow up with the patient and members of the medical team: >30 minutes  Hakan Nudelman Barrientos PA-C

## 2024-02-20 NOTE — Progress Notes (Signed)
 The patient is status post the ERCP with a biliary stent placed at that time.  The patient drainage bag had been emptied during the procedure bilious drainage. This morning the patient's fluid is serosanguineous without any sign of the previously seen green bile.  Patient should have a repeat ERCP in 3 months at nothing further to do from a GI point of view.  Not appear to have any signs of ERCP pancreatitis or complications.  I will sign off.  Please call if any further GI concerns or questions.  We would like to thank you for the opportunity to participate in the care of Alicia Holder.

## 2024-02-20 NOTE — TOC CM/SW Note (Signed)
 Transition of Care [facility]) CM/SW Note    Transition of Care [facility]) - Inpatient Brief Assessment   Patient Details  Name: NAYDENE KAMROWSKI MRN: [id number] Date of Birth: Oct 17, 1950  Transition of Care [facility]) CM/SW Contact:    Alfonso Rummer, LCSW Phone Number: 02/20/2024, 10:18 AM   Clinical Narrative:  Completed toc chart review No TOC needs at this time. Please contact toc should needs arise.   Transition of Care Asessment: Insurance and Status: Insurance coverage has been reviewed Patient has primary care physician: Yes ([facility]) Home environment has been reviewed: single family home   Prior/Current Home Services: Current home services ([facility]) Social Drivers of Health Review: SDOH reviewed no interventions necessary Readmission risk has been reviewed: No Transition of care needs: no transition of care needs at this time

## 2024-02-20 NOTE — Progress Notes (Signed)
 Discharge instructions given to patient, questions answered. IV removed without complications. Patients medication to be delivered to bedside prior to discharge. Patient to discharge with JP drain in tact. Writer provided hand on instructions how to care for and document output for drain. Patient and daughter verbalized understanding. Patient transporting home via car by her daughter.

## 2024-02-20 NOTE — Anesthesia Postprocedure Evaluation (Signed)
"   Anesthesia Post Note  Patient: Alicia Holder  Procedure(s) Performed: ERCP, WITH INTERVENTION IF INDICATED INSERTION, STENT, BILE DUCT  Patient location during evaluation: PACU Anesthesia Type: General Level of consciousness: awake and alert Pain management: pain level controlled Vital Signs Assessment: post-procedure vital signs reviewed and stable Respiratory status: spontaneous breathing, nonlabored ventilation, respiratory function stable and patient connected to nasal cannula oxygen Cardiovascular status: blood pressure returned to baseline and stable Postop Assessment: no apparent nausea or vomiting Anesthetic complications: no   No notable events documented.   Last Vitals:  Vitals:   02/19/24 1954 02/20/24 0316  BP: 109/76 126/80  Pulse: 66 64  Resp:    Temp: 36.5 C 36.4 C  SpO2: 97% 98%    Last Pain:  Vitals:   02/20/24 0818  TempSrc:   PainSc: 7                  Lynwood KANDICE Clause      "

## 2024-02-20 NOTE — Plan of Care (Signed)
" °  Problem: Education: Goal: Knowledge of General Education information will improve Description: Including pain rating scale, medication(s)/side effects and non-pharmacologic comfort measures Outcome: Progressing   Problem: Clinical Measurements: Goal: Ability to maintain clinical measurements within normal limits will improve Outcome: Progressing Goal: Will remain free from infection Outcome: Progressing Goal: Diagnostic test results will improve Outcome: Progressing Goal: Respiratory complications will improve Outcome: Progressing Goal: Cardiovascular complication will be avoided Outcome: Progressing   Problem: Activity: Goal: Risk for activity intolerance will decrease Outcome: Progressing   Problem: Nutrition: Goal: Adequate nutrition will be maintained Outcome: Progressing   Problem: Coping: Goal: Level of anxiety will decrease Outcome: Progressing   Problem: Elimination: Goal: Will not experience complications related to bowel motility Outcome: Progressing Goal: Will not experience complications related to urinary retention Outcome: Progressing   Problem: Pain Managment: Goal: General experience of comfort will improve and/or be controlled Outcome: Progressing   Problem: Safety: Goal: Ability to remain free from injury will improve Outcome: Progressing   Problem: Skin Integrity: Goal: Risk for impaired skin integrity will decrease Outcome: Progressing   Problem: Education: Goal: Knowledge of the prescribed therapeutic regimen will improve Outcome: Progressing   Problem: Bowel/Gastric: Goal: Gastrointestinal status for postoperative course will improve Outcome: Progressing   Problem: Cardiac: Goal: Ability to maintain an adequate cardiac output Outcome: Progressing Goal: Will show no evidence of cardiac arrhythmias Outcome: Progressing   Problem: Nutritional: Goal: Will attain and maintain optimal nutritional status Outcome: Progressing   Problem:  Neurological: Goal: Will regain or maintain usual level of consciousness Outcome: Progressing   Problem: Clinical Measurements: Goal: Ability to maintain clinical measurements within normal limits Outcome: Progressing Goal: Postoperative complications will be avoided or minimized Outcome: Progressing   Problem: Respiratory: Goal: Will regain and/or maintain adequate ventilation Outcome: Progressing Goal: Respiratory status will improve Outcome: Progressing   Problem: Skin Integrity: Goal: Demonstrates signs of wound healing without infection Outcome: Progressing   Problem: Urinary Elimination: Goal: Will remain free from infection Outcome: Progressing Goal: Ability to achieve and maintain adequate urine output Outcome: Progressing   "

## 2024-02-20 NOTE — Telephone Encounter (Signed)
 Patient needs to be scheduled for lung nodule consult. LVMTCB to schedule lung nodule consult.

## 2024-02-20 NOTE — Progress Notes (Signed)
 Mobility Specialist Progress Note:    02/20/24 0844  Mobility  Activity Ambulated with assistance;Pivoted/transferred from bed to chair  Level of Assistance Contact guard assist, steadying assist  Assistive Device Front wheel walker  Distance Ambulated (ft) 160 ft  Range of Motion/Exercises Active;All extremities  Activity Response Tolerated well  Mobility visit 1 Mobility  Mobility Specialist Start Time (ACUTE ONLY) [id number]  Mobility Specialist Stop Time (ACUTE ONLY) 0843  Mobility Specialist Time Calculation (min) (ACUTE ONLY) 19 min   Pt received in bed w/ nursing, agreeable to mobility. Required CGA-SBA to stand and ambulate with RW. Tolerated well, SpO2 94% on RA after ambulation w/ minimal SOB. Left pt in chair with alarm on and belongings in reach, all needs met.  Sherrilee Ditty Mobility Specialist Please contact via Special Educational Needs Teacher or  Rehab office at [phone]

## 2024-02-20 NOTE — Telephone Encounter (Signed)
 Dr. Shelah requests a pulmonary consult for this patient.  Patient had a LDCT by other provider and Dr. Shelah has reviewed the results and requests consult.  Patient lives in [place] and may prefer [place] appointment.  Patient has recently been in the hospital.  Called patient today to see where she would like the appointment but no answer. Left VM that we were trying to reach her to schedule consult and would have the [facility] office contact her. Also left our office number if she has questions or prefers [facility] appointment.  Sent a message to [place] in the [place] office to reach out to the patient for scheduling the consult

## 2024-02-20 NOTE — Care Management Important Message (Signed)
 Important Message  Patient Details  Name: Alicia Holder MRN: [id number] Date of Birth: 1950-12-15   Important Message Given:  Yes - Medicare IM     Felicia Bloomquist W, CMA 02/20/2024, 11:53 AM

## 2024-02-22 NOTE — Progress Notes (Signed)
 TRANSITIONAL CARE TELEPHONE ENCOUNTER NOTE   Alicia Holder is a 74 y.o. female who was contacted after a hospitalization at [facility] on 02/15/24-02/20/24 for cholecystitis:  The following were reviewed with the patient today:   Note: Only barriers and interventions are noted below. If no flowsheet data is found, then no issues were identified or the patient was not able to be reached.    Patient Knowledge and Self Care      02/22/2024    2:44 PM  Patient Knowledge and Self-Care  Did the patient/caregiver understand the primary reason for hospitalization? Yes  Additional Comments s/p lap chole  Is the patient or the patient's caregiver able to describe what is necessary for self care at this time? Yes  Are there any additional educational needs identified today? No     Discharge Follow-Up Appointment      02/22/2024    2:44 PM  Appointments  Does the patient have an appropriate follow-up visit scheduled within 14 days of discharge? No  Date of Appointment: --  Additional Comments will see surgery. Pt stated never wem t to [facility]  Was assistance offered to scheduled/reschedule the hospital followup appointment? Yes - Patient declined  Are there any barriers to the patient keeping their appointment? No  Has the patient been hospitalized more than once in the past 30 days? No    Financial Resource Strain: Low Risk  (12/08/2023)   Overall Financial Resource Strain (CARDIA)    Difficulty of Paying Living Expenses: Not very hard    Transportation Needs: No Transportation Needs (02/16/2024)   Received from [facility] - Transportation    In the past 12 months, has lack of transportation kept you from medical appointments or from getting medications?: No    In the past 12 months, has lack of transportation kept you from meetings, work, or from getting things needed for daily living?: No    Food Insecurity: No Food Insecurity (02/16/2024)    Received from [facility]   Hunger Vital Sign    Within the past 12 months, you worried that your food would run out before you got the money to buy more.: Never true    Within the past 12 months, the food you bought just didn't last and you didn't have money to get more.: Never true      Medication Review      02/22/2024    2:44 PM  Medication Review  Were any new medicines prescribed at discharge? Yes  Reconciled current and discharge medications? (RN and Pharmacist ONLY) No      New or Worsening Health Issues      02/22/2024    2:44 PM  New or Worsening Health Issues  Is the patient experiencing any new or worsening symptoms after discharge? No      Home Health       02/22/2024    2:44 PM  Home Health  Has your home care agency contacted you? Not Applicable    Is patient eligible for TCM coding?:     02/22/2024    2:44 PM  [facility] Coding Eligibility  Did you communicate with the patient and/or caregiver within 2 business days of discharge OR Did you document two or more separate communication attempts within 2 business days of discharge? Yes  Is the patient eligible for TCM billing? Yes - (patient is eligible for TCM coding)    The importance of keeping the appointment was emphasized to  her.  Future Appointments   This patient does not currently have any appointments scheduled.      [facility] Trans[facility]: Post-Surgical Outcomes Assessment  02/22/2024 2:37 PM  Pain Management 1. Is your pain under control? yes 2. Please rate your pain on a scale of 1-10 3  Mobility 1. Are you able to ambulate around the house? yes  Dressing and/or incision 1. Have you looked at your incision and dressing? yes 2. Any signs or symptoms of infection? no  Staples/suture removal 1. When are you getting your staples/sutures removed? In 2 weeks pt to call  Bowel issues 1. Are you having any problems with your bowels? no   Actions Taken/Additional Comments:  Patient  stateds she is doing well.  Her drain output color is getting lighter.  She stated she does not go to [facility], her PCP in [place].  Did not complete any more of assessment for SDOH.   LEONA WASHINGTON 

## 2024-02-27 NOTE — Telephone Encounter (Signed)
 Left message on voicemail for pt to return my call to schedule ERCP w/ stent removal for April 6th

## 2024-02-29 ENCOUNTER — Ambulatory Visit: Admitting: Pulmonary Disease
